# Patient Record
Sex: Male | Born: 1945 | Race: White | Hispanic: No | Marital: Married | Smoking: Former smoker
Health system: Southern US, Community
[De-identification: ages and names within clinical notes are randomized; demographics above are authoritative.]

## PROBLEM LIST (undated history)

## (undated) DIAGNOSIS — N200 Calculus of kidney: Secondary | ICD-10-CM

## (undated) DIAGNOSIS — C61 Malignant neoplasm of prostate: Secondary | ICD-10-CM

## (undated) DIAGNOSIS — E039 Hypothyroidism, unspecified: Secondary | ICD-10-CM

## (undated) DIAGNOSIS — I4891 Unspecified atrial fibrillation: Secondary | ICD-10-CM

## (undated) DIAGNOSIS — G4733 Obstructive sleep apnea (adult) (pediatric): Secondary | ICD-10-CM

## (undated) DIAGNOSIS — I10 Essential (primary) hypertension: Secondary | ICD-10-CM

## (undated) DIAGNOSIS — M720 Palmar fascial fibromatosis [Dupuytren]: Secondary | ICD-10-CM

## (undated) DIAGNOSIS — I4819 Other persistent atrial fibrillation: Secondary | ICD-10-CM

## (undated) DIAGNOSIS — C4490 Unspecified malignant neoplasm of skin, unspecified: Secondary | ICD-10-CM

## (undated) DIAGNOSIS — E876 Hypokalemia: Secondary | ICD-10-CM

## (undated) DIAGNOSIS — I340 Nonrheumatic mitral (valve) insufficiency: Secondary | ICD-10-CM

## (undated) DIAGNOSIS — G4730 Sleep apnea, unspecified: Secondary | ICD-10-CM

## (undated) HISTORY — DX: Sleep apnea, unspecified: G47.30

## (undated) HISTORY — DX: Calculus of kidney: N20.0

## (undated) HISTORY — DX: Unspecified malignant neoplasm of skin, unspecified: C44.90

## (undated) HISTORY — DX: Nonrheumatic mitral (valve) insufficiency: I34.0

## (undated) HISTORY — DX: Other persistent atrial fibrillation: I48.19

## (undated) HISTORY — DX: Essential (primary) hypertension: I10

## (undated) HISTORY — DX: Hypothyroidism, unspecified: E03.9

## (undated) HISTORY — DX: Obstructive sleep apnea (adult) (pediatric): G47.33

## (undated) HISTORY — DX: Malignant neoplasm of prostate: C61

## (undated) HISTORY — DX: Palmar fascial fibromatosis (dupuytren): M72.0

## (undated) HISTORY — DX: Unspecified atrial fibrillation: I48.91

## (undated) HISTORY — PX: RADIOACTIVE SEED IMPLANT: SHX5150

## (undated) HISTORY — DX: Hypokalemia: E87.6

---

## 2009-11-16 ENCOUNTER — Ambulatory Visit: Payer: Self-pay | Admitting: Radiation Oncology

## 2009-12-16 ENCOUNTER — Ambulatory Visit: Payer: Self-pay | Admitting: Radiation Oncology

## 2010-01-03 ENCOUNTER — Ambulatory Visit: Payer: Self-pay | Admitting: Radiation Oncology

## 2010-01-16 ENCOUNTER — Ambulatory Visit: Payer: Self-pay | Admitting: Radiation Oncology

## 2010-01-19 ENCOUNTER — Ambulatory Visit: Payer: Self-pay | Admitting: Urology

## 2010-01-31 ENCOUNTER — Ambulatory Visit: Payer: Self-pay | Admitting: Urology

## 2010-02-15 ENCOUNTER — Ambulatory Visit: Payer: Self-pay | Admitting: Radiation Oncology

## 2010-03-18 ENCOUNTER — Ambulatory Visit: Payer: Self-pay | Admitting: Radiation Oncology

## 2010-07-05 ENCOUNTER — Ambulatory Visit: Payer: Self-pay | Admitting: Radiation Oncology

## 2010-07-06 LAB — PSA

## 2010-07-17 ENCOUNTER — Ambulatory Visit: Payer: Self-pay | Admitting: Radiation Oncology

## 2010-08-17 ENCOUNTER — Ambulatory Visit: Payer: Self-pay | Admitting: Family Medicine

## 2011-01-04 ENCOUNTER — Ambulatory Visit: Payer: Self-pay | Admitting: Radiation Oncology

## 2011-01-17 ENCOUNTER — Ambulatory Visit: Payer: Self-pay | Admitting: Radiation Oncology

## 2012-01-10 ENCOUNTER — Ambulatory Visit: Payer: Self-pay | Admitting: Radiation Oncology

## 2012-01-11 LAB — PSA: PSA: 0.1 ng/mL (ref 0.0–4.0)

## 2012-01-17 ENCOUNTER — Ambulatory Visit: Payer: Self-pay | Admitting: Radiation Oncology

## 2013-01-08 ENCOUNTER — Ambulatory Visit: Payer: Self-pay | Admitting: Radiation Oncology

## 2013-01-16 ENCOUNTER — Ambulatory Visit: Payer: Self-pay | Admitting: Radiation Oncology

## 2014-01-06 ENCOUNTER — Ambulatory Visit: Payer: Self-pay | Admitting: Radiation Oncology

## 2014-01-16 ENCOUNTER — Ambulatory Visit: Payer: Self-pay | Admitting: Radiation Oncology

## 2014-09-05 ENCOUNTER — Ambulatory Visit (INDEPENDENT_AMBULATORY_CARE_PROVIDER_SITE_OTHER): Payer: Commercial Managed Care - HMO | Admitting: Family Medicine

## 2014-09-05 ENCOUNTER — Encounter: Payer: Self-pay | Admitting: Family Medicine

## 2014-09-05 VITALS — BP 105/76 | HR 68 | Temp 97.9°F | Ht 72.3 in | Wt 235.0 lb

## 2014-09-05 DIAGNOSIS — M720 Palmar fascial fibromatosis [Dupuytren]: Secondary | ICD-10-CM | POA: Diagnosis not present

## 2014-09-05 DIAGNOSIS — I10 Essential (primary) hypertension: Secondary | ICD-10-CM

## 2014-09-05 DIAGNOSIS — E039 Hypothyroidism, unspecified: Secondary | ICD-10-CM

## 2014-09-05 DIAGNOSIS — C61 Malignant neoplasm of prostate: Secondary | ICD-10-CM | POA: Diagnosis not present

## 2014-09-05 HISTORY — DX: Malignant neoplasm of prostate: C61

## 2014-09-05 HISTORY — DX: Essential (primary) hypertension: I10

## 2014-09-05 LAB — URINALYSIS, ROUTINE W REFLEX MICROSCOPIC
Bilirubin, UA: NEGATIVE
Glucose, UA: NEGATIVE
Ketones, UA: NEGATIVE
LEUKOCYTES UA: NEGATIVE
Nitrite, UA: NEGATIVE
PH UA: 5 (ref 5.0–7.5)
PROTEIN UA: NEGATIVE
RBC, UA: NEGATIVE
Specific Gravity, UA: 1.03 (ref 1.005–1.030)
Urobilinogen, Ur: 0.2 mg/dL (ref 0.2–1.0)

## 2014-09-05 MED ORDER — LEVOTHYROXINE SODIUM 75 MCG PO TABS: 75.0000 ug | ORAL_TABLET | Freq: Every day | ORAL | Status: DC

## 2014-09-05 MED ORDER — BENAZEPRIL HCL 40 MG PO TABS: 40.0000 mg | ORAL_TABLET | Freq: Every day | ORAL | Status: DC

## 2014-09-05 MED ORDER — AMLODIPINE BESYLATE 10 MG PO TABS: 10.0000 mg | ORAL_TABLET | Freq: Every day | ORAL | Status: DC

## 2014-09-05 NOTE — Assessment & Plan Note (Signed)
The current medical regimen is effective;  continue present plan and medications.  

## 2014-09-05 NOTE — Assessment & Plan Note (Signed)
Stable not much progression

## 2014-09-05 NOTE — Progress Notes (Signed)
BP 105/76 mmHg  Pulse 68  Temp(Src) 97.9 F (36.6 C)  Ht 6' 0.3" (1.836 m)  Wt 235 lb (106.595 kg)  BMI 31.62 kg/m2  SpO2 98%   Subjective:    Patient ID: [name] [name], male    DOB: 1945/07/17, 69 y.o.   MRN: [id number]  HPI: [name] [name] is a 69 y.o. male  Chief Complaint  Patient presents with  . Annual Exam    Relevant past medical, surgical, family and social history reviewed and updated as indicated. Interim medical history since our last visit reviewed. Allergies and medications reviewed and updated. BP and thyroid doing well, no side effects, taking meds every day, no edema Feeling well  Review of Systems  Constitutional: Negative.   HENT: Negative.   Eyes: Negative.   Respiratory: Negative.   Cardiovascular: Negative.   Endocrine: Negative.   Musculoskeletal: Negative.   Skin: Negative.   Allergic/Immunologic: Negative.   Neurological: Negative.   Hematological: Negative.   Psychiatric/Behavioral: Negative.     Per HPI unless specifically indicated above     Objective:    BP 105/76 mmHg  Pulse 68  Temp(Src) 97.9 F (36.6 C)  Ht 6' 0.3" (1.836 m)  Wt 235 lb (106.595 kg)  BMI 31.62 kg/m2  SpO2 98%  Wt Readings from Last 3 Encounters:  09/05/14 235 lb (106.595 kg)  02/28/14 233 lb (105.688 kg)    Physical Exam  Constitutional: He is oriented to person, place, and time. He appears well-developed and well-nourished.  HENT:  Head: Normocephalic and atraumatic.  Right Ear: External ear normal.  Left Ear: External ear normal.  Eyes: Conjunctivae and EOM are normal. Pupils are equal, round, and reactive to light.  Neck: Normal range of motion. Neck supple.  Cardiovascular: Normal rate, regular rhythm, normal heart sounds and intact distal pulses.   Pulmonary/Chest: Effort normal and breath sounds normal.  Abdominal: Soft. Bowel sounds are normal. There is no splenomegaly or hepatomegaly.  Genitourinary: Rectum normal and penis normal.   Prostate s/p radiation   Musculoskeletal: Normal range of motion.  Neurological: He is alert and oriented to person, place, and time. He has normal reflexes.  Skin: No rash noted. No erythema.  Dupuytren's is stable  Psychiatric: He has a normal mood and affect. His behavior is normal. Judgment and thought content normal.    Results for orders placed or performed in visit on 01/10/12  PSA  Result Value Ref Range   PSA 0.1 0.0-4.0 ng/mL      Assessment & Plan:   Problem List Items Addressed This Visit      Cardiovascular and Mediastinum   Hypertension - Primary    The current medical regimen is effective;  continue present plan and medications.       Relevant Medications   amLODipine (NORVASC) 10 MG tablet   benazepril (LOTENSIN) 40 MG tablet   Other Relevant Orders   CBC with Differential/Platelet   Urinalysis, Routine w reflex microscopic (not at [facility])   Lipid panel   Comprehensive metabolic panel     Endocrine   Hypothyroidism    The current medical regimen is effective;  continue present plan and medications.       Relevant Medications   levothyroxine (SYNTHROID, LEVOTHROID) 75 MCG tablet   Other Relevant Orders   TSH     Musculoskeletal and Integument   Dupuytren's contracture    Stable not much progression         Genitourinary   Malignant  neoplasm of prostate    Stable over 6 years now      Relevant Orders   PSA       Follow up plan: Return in about 6 months (around 03/07/2015) for bp chcck.

## 2014-09-05 NOTE — Assessment & Plan Note (Signed)
Stable over 6 years now

## 2014-09-06 LAB — LIPID PANEL
Chol/HDL Ratio: 2.8 ratio units (ref 0.0–5.0)
Cholesterol, Total: 154 mg/dL (ref 100–199)
HDL: 55 mg/dL (ref >39)
LDL CALC: 89 mg/dL (ref 0–99)
Triglycerides: 50 mg/dL (ref 0–149)
VLDL CHOLESTEROL CAL: 10 mg/dL (ref 5–40)

## 2014-09-06 LAB — CBC WITH DIFFERENTIAL/PLATELET
Basophils Absolute: 0 10*3/uL (ref 0.0–0.2)
Basos: 0 %
EOS (ABSOLUTE): 0.2 10*3/uL (ref 0.0–0.4)
EOS: 3 %
Hematocrit: 46.9 % (ref 37.5–51.0)
Hemoglobin: 15.7 g/dL (ref 12.6–17.7)
IMMATURE GRANULOCYTES: 0 %
Immature Grans (Abs): 0 10*3/uL (ref 0.0–0.1)
LYMPHS: 30 %
Lymphocytes Absolute: 1.7 10*3/uL (ref 0.7–3.1)
MCH: 30.5 pg (ref 26.6–33.0)
MCHC: 33.5 g/dL (ref 31.5–35.7)
MCV: 91 fL (ref 79–97)
MONOCYTES: 11 %
Monocytes Absolute: 0.6 10*3/uL (ref 0.1–0.9)
Neutrophils Absolute: 3.2 10*3/uL (ref 1.4–7.0)
Neutrophils: 56 %
PLATELETS: 198 10*3/uL (ref 150–379)
RBC: 5.14 x10E6/uL (ref 4.14–5.80)
RDW: 15.1 % (ref 12.3–15.4)
WBC: 5.7 10*3/uL (ref 3.4–10.8)

## 2014-09-06 LAB — COMPREHENSIVE METABOLIC PANEL
ALBUMIN: 4.2 g/dL (ref 3.6–4.8)
ALK PHOS: 75 IU/L (ref 39–117)
ALT: 24 IU/L (ref 0–44)
AST: 18 IU/L (ref 0–40)
Albumin/Globulin Ratio: 2.2 (ref 1.1–2.5)
BUN/Creatinine Ratio: 15 (ref 10–22)
BUN: 19 mg/dL (ref 8–27)
Bilirubin Total: 1.3 mg/dL — ABNORMAL HIGH (ref 0.0–1.2)
CHLORIDE: 105 mmol/L (ref 97–108)
CO2: 22 mmol/L (ref 18–29)
Calcium: 8.9 mg/dL (ref 8.6–10.2)
Creatinine, Ser: 1.24 mg/dL (ref 0.76–1.27)
GFR calc Af Amer: 68 mL/min/{1.73_m2} (ref >59)
GFR calc non Af Amer: 59 mL/min/{1.73_m2} — ABNORMAL LOW (ref >59)
GLUCOSE: 107 mg/dL — AB (ref 65–99)
Globulin, Total: 1.9 g/dL (ref 1.5–4.5)
Potassium: 4.5 mmol/L (ref 3.5–5.2)
Sodium: 142 mmol/L (ref 134–144)
TOTAL PROTEIN: 6.1 g/dL (ref 6.0–8.5)

## 2014-09-06 LAB — PSA: Prostate Specific Ag, Serum: 0.1 ng/mL (ref 0.0–4.0)

## 2014-09-06 LAB — TSH: TSH: 3.55 u[IU]/mL (ref 0.450–4.500)

## 2014-09-16 ENCOUNTER — Other Ambulatory Visit: Payer: Self-pay | Admitting: Family Medicine

## 2015-01-15 ENCOUNTER — Other Ambulatory Visit: Payer: Self-pay | Admitting: Family Medicine

## 2015-03-07 ENCOUNTER — Other Ambulatory Visit: Payer: Self-pay | Admitting: Family Medicine

## 2015-03-07 ENCOUNTER — Ambulatory Visit (INDEPENDENT_AMBULATORY_CARE_PROVIDER_SITE_OTHER): Payer: Commercial Managed Care - HMO | Admitting: Family Medicine

## 2015-03-07 ENCOUNTER — Encounter: Payer: Self-pay | Admitting: Family Medicine

## 2015-03-07 VITALS — BP 122/77 | HR 70 | Temp 97.9°F | Ht 72.0 in | Wt 233.0 lb

## 2015-03-07 DIAGNOSIS — Z1211 Encounter for screening for malignant neoplasm of colon: Secondary | ICD-10-CM | POA: Diagnosis not present

## 2015-03-07 DIAGNOSIS — Z23 Encounter for immunization: Secondary | ICD-10-CM

## 2015-03-07 DIAGNOSIS — E039 Hypothyroidism, unspecified: Secondary | ICD-10-CM | POA: Diagnosis not present

## 2015-03-07 DIAGNOSIS — I10 Essential (primary) hypertension: Secondary | ICD-10-CM

## 2015-03-07 DIAGNOSIS — M720 Palmar fascial fibromatosis [Dupuytren]: Secondary | ICD-10-CM

## 2015-03-07 MED ORDER — AMLODIPINE BESYLATE 10 MG PO TABS: 10.0000 mg | ORAL_TABLET | Freq: Every day | ORAL | Status: DC

## 2015-03-07 MED ORDER — BENAZEPRIL HCL 40 MG PO TABS: 40.0000 mg | ORAL_TABLET | Freq: Every day | ORAL | Status: DC

## 2015-03-07 NOTE — Assessment & Plan Note (Signed)
stable °

## 2015-03-07 NOTE — Assessment & Plan Note (Signed)
The current medical regimen is effective;  continue present plan and medications.  

## 2015-03-07 NOTE — Progress Notes (Signed)
BP 122/77 mmHg  Pulse 70  Temp(Src) 97.9 F (36.6 C)  Ht 6' (1.829 m)  Wt 233 lb (105.688 kg)  BMI 31.59 kg/m2  SpO2 100%   Subjective:    Patient ID: [name] [name], male    DOB: [birth year], 69 y.o.   MRN: [id number]  HPI: [name] [name] is a 69 y.o. male  Chief Complaint  Patient presents with  . Hypertension   Doing well with blood pressure no complaints medications no side effects takes faithfully Hypothyroid doing well takes medicines faithfully without problems Due to potential contracture stable no real clawing.  Relevant past medical, surgical, family and social history reviewed and updated as indicated. Interim medical history since our last visit reviewed. Allergies and medications reviewed and updated.  Review of Systems  Constitutional: Negative.   Respiratory: Negative.   Cardiovascular: Negative.     Per HPI unless specifically indicated above     Objective:    BP 122/77 mmHg  Pulse 70  Temp(Src) 97.9 F (36.6 C)  Ht 6' (1.829 m)  Wt 233 lb (105.688 kg)  BMI 31.59 kg/m2  SpO2 100%  Wt Readings from Last 3 Encounters:  03/07/15 233 lb (105.688 kg)  09/05/14 235 lb (106.595 kg)  02/28/14 233 lb (105.688 kg)    Physical Exam  Constitutional: He is oriented to person, place, and time. He appears well-developed and well-nourished. No distress.  HENT:  Head: Normocephalic and atraumatic.  Right Ear: Hearing normal.  Left Ear: Hearing normal.  Nose: Nose normal.  Eyes: Conjunctivae and lids are normal. Right eye exhibits no discharge. Left eye exhibits no discharge. No scleral icterus.  Cardiovascular: Normal rate, regular rhythm and normal heart sounds.   Pulmonary/Chest: Effort normal and breath sounds normal. No respiratory distress.  Musculoskeletal: Normal range of motion.  Neurological: He is alert and oriented to person, place, and time.  Skin: Skin is intact. No rash noted.  Psychiatric: He has a normal mood and affect. His speech  is normal and behavior is normal. Judgment and thought content normal. Cognition and memory are normal.    Results for orders placed or performed in visit on 09/05/14  CBC with Differential/Platelet  Result Value Ref Range   WBC 5.7 3.4 - 10.8 x10E3/uL   RBC 5.14 4.14 - 5.80 x10E6/uL   Hemoglobin 15.7 12.6 - 17.7 g/dL   Hematocrit 46.9 37.5 - 51.0 %   MCV 91 79 - 97 fL   MCH 30.5 26.6 - 33.0 pg   MCHC 33.5 31.5 - 35.7 g/dL   RDW 15.1 12.3 - 15.4 %   Platelets 198 150 - 379 x10E3/uL   Neutrophils 56 %   Lymphs 30 %   Monocytes 11 %   Eos 3 %   Basos 0 %   Neutrophils Absolute 3.2 1.4 - 7.0 x10E3/uL   Lymphocytes Absolute 1.7 0.7 - 3.1 x10E3/uL   Monocytes Absolute 0.6 0.1 - 0.9 x10E3/uL   EOS (ABSOLUTE) 0.2 0.0 - 0.4 x10E3/uL   Basophils Absolute 0.0 0.0 - 0.2 x10E3/uL   Immature Granulocytes 0 %   Immature Grans (Abs) 0.0 0.0 - 0.1 x10E3/uL  Urinalysis, Routine w reflex microscopic (not at [facility])  Result Value Ref Range   Specific Gravity, UA 1.030 1.005 - 1.030   pH, UA 5.0 5.0 - 7.5   Color, UA Yellow Yellow   Appearance Ur Clear Clear   Leukocytes, UA Negative Negative   Protein, UA Negative Negative/Trace   Glucose, UA Negative  Negative   Ketones, UA Negative Negative   RBC, UA Negative Negative   Bilirubin, UA Negative Negative   Urobilinogen, Ur 0.2 0.2 - 1.0 mg/dL   Nitrite, UA Negative Negative  TSH  Result Value Ref Range   TSH 3.550 0.450 - 4.500 uIU/mL  PSA  Result Value Ref Range   Prostate Specific Ag, Serum 0.1 0.0 - 4.0 ng/mL  Lipid panel  Result Value Ref Range   Cholesterol, Total 154 100 - 199 mg/dL   Triglycerides 50 0 - 149 mg/dL   HDL 55 >39 mg/dL   VLDL Cholesterol Cal 10 5 - 40 mg/dL   LDL Calculated 89 0 - 99 mg/dL   Chol/HDL Ratio 2.8 0.0 - 5.0 ratio units  Comprehensive metabolic panel  Result Value Ref Range   Glucose 107 (H) 65 - 99 mg/dL   BUN 19 8 - 27 mg/dL   Creatinine, Ser 1.24 0.76 - 1.27 mg/dL   GFR calc non Af Amer 59 (L)  >59 mL/min/1.73   GFR calc Af Amer 68 >59 mL/min/1.73   BUN/Creatinine Ratio 15 10 - 22   Sodium 142 134 - 144 mmol/L   Potassium 4.5 3.5 - 5.2 mmol/L   Chloride 105 97 - 108 mmol/L   CO2 22 18 - 29 mmol/L   Calcium 8.9 8.6 - 10.2 mg/dL   Total Protein 6.1 6.0 - 8.5 g/dL   Albumin 4.2 3.6 - 4.8 g/dL   Globulin, Total 1.9 1.5 - 4.5 g/dL   Albumin/Globulin Ratio 2.2 1.1 - 2.5   Bilirubin Total 1.3 (H) 0.0 - 1.2 mg/dL   Alkaline Phosphatase 75 39 - 117 IU/L   AST 18 0 - 40 IU/L   ALT 24 0 - 44 IU/L      Assessment & Plan:   Problem List Items Addressed This Visit      Cardiovascular and Mediastinum   Hypertension    The current medical regimen is effective;  continue present plan and medications.       Relevant Medications   amLODipine (NORVASC) 10 MG tablet   benazepril (LOTENSIN) 40 MG tablet     Endocrine   Hypothyroidism    The current medical regimen is effective;  continue present plan and medications.         Musculoskeletal and Integument   Dupuytren's contracture    stable       Other Visit Diagnoses    Colon cancer screening    -  Primary    Relevant Orders    Ambulatory referral to General Surgery    Immunization due        Relevant Orders    Flu Vaccine QUAD 36+ mos PF IM (Fluarix & Fluzone Quad PF) (Completed)        Follow up plan: Return in about 6 months (around 09/05/2015), or if symptoms worsen or fail to improve, for Physical Exam.

## 2015-03-07 NOTE — Addendum Note (Signed)
Addended byGolden Pop on: 03/07/2015 08:56 AM   Modules accepted: Orders, SmartSet

## 2015-03-08 ENCOUNTER — Telehealth: Payer: Self-pay | Admitting: Gastroenterology

## 2015-03-08 ENCOUNTER — Encounter: Payer: Self-pay | Admitting: Family Medicine

## 2015-03-08 LAB — BASIC METABOLIC PANEL
BUN/Creatinine Ratio: 10 (ref 10–22)
BUN: 13 mg/dL (ref 8–27)
CO2: 24 mmol/L (ref 18–29)
Calcium: 9.4 mg/dL (ref 8.6–10.2)
Chloride: 106 mmol/L (ref 96–106)
Creatinine, Ser: 1.31 mg/dL — ABNORMAL HIGH (ref 0.76–1.27)
GFR calc Af Amer: 63 mL/min/{1.73_m2} (ref >59)
GFR calc non Af Amer: 55 mL/min/{1.73_m2} — ABNORMAL LOW (ref >59)
GLUCOSE: 106 mg/dL — AB (ref 65–99)
POTASSIUM: 4.3 mmol/L (ref 3.5–5.2)
SODIUM: 144 mmol/L (ref 134–144)

## 2015-03-08 NOTE — Telephone Encounter (Signed)
Patient left a voice message that he is returning a phone call for colonoscopy triage

## 2015-03-16 ENCOUNTER — Other Ambulatory Visit: Payer: Self-pay

## 2015-03-16 ENCOUNTER — Telehealth: Payer: Self-pay | Admitting: Gastroenterology

## 2015-03-16 NOTE — Telephone Encounter (Signed)
Gastroenterology Pre-Procedure Review  Request Date: 04-18-2015 Requesting Physician: Dr.   PATIENT REVIEW QUESTIONS: The patient responded to the following health history questions as indicated:    1. Are you having any GI issues? no 2. Do you have a personal history of Polyps? no 3. Do you have a family history of Colon Cancer or Polyps? no 4. Diabetes Mellitus? no 5. Joint replacements in the past 12 months?no 6. Major health problems in the past 3 months?no 7. Any artificial heart valves, MVP, or defibrillator?no    MEDICATIONS & ALLERGIES:    Patient reports the following regarding taking any anticoagulation/antiplatelet therapy:   Plavix, Coumadin, Eliquis, Xarelto, Lovenox, Pradaxa, Brilinta, or Effient? no Aspirin? yes (Daily)  Patient confirms/reports the following medications:  Current Outpatient Prescriptions  Medication Sig Dispense Refill   amLODipine (NORVASC) 10 MG tablet Take 1 tablet (10 mg total) by mouth daily. 30 tablet 7   aspirin 81 MG tablet Take 81 mg by mouth daily.     benazepril (LOTENSIN) 40 MG tablet Take 1 tablet (40 mg total) by mouth daily. 30 tablet 7   levothyroxine (SYNTHROID, LEVOTHROID) 75 MCG tablet TAKE ONE TABLET BY MOUTH ONCE DAILY 30 tablet 12   Magnesium Citrate 100 MG TABS Take 1 capsule by mouth daily.     Multiple Vitamin (MULTIVITAMIN) tablet Take 1 tablet by mouth daily.     Selenium 200 MCG CAPS Take 1 capsule by mouth daily.     No current facility-administered medications for this visit.    Patient confirms/reports the following allergies:  No Known Allergies  No orders of the defined types were placed in this encounter.    AUTHORIZATION INFORMATION Primary Insurance:HTA 03/2015 1D#: Group #:  Secondary Insurance: 1D#: Group #:  SCHEDULE INFORMATION: Date: 04-18-2015 Time: Location:

## 2015-04-17 ENCOUNTER — Encounter: Payer: Self-pay | Admitting: *Deleted

## 2015-04-18 ENCOUNTER — Inpatient Hospital Stay
Admission: EM | Admit: 2015-04-18 | Discharge: 2015-04-19 | DRG: 310 | Disposition: A | Discharge status: Home / Self Care | Payer: PPO | Attending: Internal Medicine | Admitting: Internal Medicine

## 2015-04-18 ENCOUNTER — Ambulatory Visit: Payer: PPO | Admitting: Anesthesiology

## 2015-04-18 ENCOUNTER — Emergency Department
Admission: EM | Admit: 2015-04-18 | Discharge: 2015-04-18 | Discharge status: Absent without leave | Payer: PPO | Source: Home / Self Care

## 2015-04-18 ENCOUNTER — Inpatient Hospital Stay (HOSPITAL_COMMUNITY)
Admit: 2015-04-18 | Discharge: 2015-04-18 | Disposition: A | Discharge status: Home / Self Care | Payer: PPO | Attending: Internal Medicine | Admitting: Internal Medicine

## 2015-04-18 ENCOUNTER — Ambulatory Visit
Admission: RE | Admit: 2015-04-18 | Discharge: 2015-04-18 | Disposition: A | Discharge status: Home / Self Care | Payer: PPO | Source: Ambulatory Visit | Attending: Gastroenterology | Admitting: Gastroenterology

## 2015-04-18 ENCOUNTER — Encounter
Admission: RE | Disposition: A | Discharge status: Home / Self Care | Payer: Self-pay | Source: Ambulatory Visit | Attending: Gastroenterology

## 2015-04-18 ENCOUNTER — Ambulatory Visit: Payer: PPO

## 2015-04-18 ENCOUNTER — Inpatient Hospital Stay: Admit: 2015-04-18 | Payer: PPO

## 2015-04-18 ENCOUNTER — Encounter: Payer: Self-pay | Admitting: *Deleted

## 2015-04-18 DIAGNOSIS — I10 Essential (primary) hypertension: Secondary | ICD-10-CM | POA: Diagnosis not present

## 2015-04-18 DIAGNOSIS — I4891 Unspecified atrial fibrillation: Secondary | ICD-10-CM

## 2015-04-18 DIAGNOSIS — Z8249 Family history of ischemic heart disease and other diseases of the circulatory system: Secondary | ICD-10-CM | POA: Diagnosis not present

## 2015-04-18 DIAGNOSIS — Z8546 Personal history of malignant neoplasm of prostate: Secondary | ICD-10-CM

## 2015-04-18 DIAGNOSIS — E039 Hypothyroidism, unspecified: Secondary | ICD-10-CM | POA: Diagnosis present

## 2015-04-18 DIAGNOSIS — E876 Hypokalemia: Secondary | ICD-10-CM | POA: Diagnosis not present

## 2015-04-18 DIAGNOSIS — Z7982 Long term (current) use of aspirin: Secondary | ICD-10-CM | POA: Diagnosis not present

## 2015-04-18 DIAGNOSIS — E038 Other specified hypothyroidism: Secondary | ICD-10-CM | POA: Diagnosis not present

## 2015-04-18 DIAGNOSIS — Z87442 Personal history of urinary calculi: Secondary | ICD-10-CM

## 2015-04-18 DIAGNOSIS — Z1211 Encounter for screening for malignant neoplasm of colon: Secondary | ICD-10-CM | POA: Diagnosis not present

## 2015-04-18 DIAGNOSIS — Z87891 Personal history of nicotine dependence: Secondary | ICD-10-CM | POA: Diagnosis not present

## 2015-04-18 DIAGNOSIS — Z79899 Other long term (current) drug therapy: Secondary | ICD-10-CM | POA: Diagnosis not present

## 2015-04-18 DIAGNOSIS — I517 Cardiomegaly: Secondary | ICD-10-CM | POA: Diagnosis not present

## 2015-04-18 HISTORY — DX: Unspecified atrial fibrillation: I48.91

## 2015-04-18 HISTORY — PX: COLONOSCOPY WITH PROPOFOL: SHX5780

## 2015-04-18 LAB — BASIC METABOLIC PANEL
ANION GAP: 11 (ref 5–15)
BUN: 14 mg/dL (ref 6–20)
CO2: 22 mmol/L (ref 22–32)
Calcium: 8.9 mg/dL (ref 8.9–10.3)
Chloride: 108 mmol/L (ref 101–111)
Creatinine, Ser: 1.07 mg/dL (ref 0.61–1.24)
GFR calc Af Amer: >60 mL/min (ref >60)
GFR calc non Af Amer: >60 mL/min (ref >60)
GLUCOSE: 111 mg/dL — AB (ref 65–99)
POTASSIUM: 3.6 mmol/L (ref 3.5–5.1)
Sodium: 141 mmol/L (ref 135–145)

## 2015-04-18 LAB — TROPONIN I: Troponin I: <0.03 ng/mL (ref <0.031)

## 2015-04-18 LAB — CBC WITH DIFFERENTIAL/PLATELET
BASOS ABS: 0.1 10*3/uL (ref 0–0.1)
Basophils Relative: 1 %
EOS PCT: 1 %
Eosinophils Absolute: 0 10*3/uL (ref 0–0.7)
HEMATOCRIT: 48.9 % (ref 40.0–52.0)
Hemoglobin: 16.3 g/dL (ref 13.0–18.0)
LYMPHS ABS: 1.2 10*3/uL (ref 1.0–3.6)
LYMPHS PCT: 18 %
MCH: 31.3 pg (ref 26.0–34.0)
MCHC: 33.4 g/dL (ref 32.0–36.0)
MCV: 93.8 fL (ref 80.0–100.0)
MONO ABS: 0.6 10*3/uL (ref 0.2–1.0)
Monocytes Relative: 9 %
NEUTROS ABS: 4.7 10*3/uL (ref 1.4–6.5)
Neutrophils Relative %: 71 %
Platelets: 171 10*3/uL (ref 150–440)
RBC: 5.21 MIL/uL (ref 4.40–5.90)
RDW: 14.8 % — AB (ref 11.5–14.5)
WBC: 6.6 10*3/uL (ref 3.8–10.6)

## 2015-04-18 LAB — GLUCOSE, CAPILLARY
GLUCOSE-CAPILLARY: 89 mg/dL (ref 65–99)
Glucose-Capillary: 73 mg/dL (ref 65–99)

## 2015-04-18 LAB — BRAIN NATRIURETIC PEPTIDE: B Natriuretic Peptide: 281 pg/mL — ABNORMAL HIGH (ref 0.0–100.0)

## 2015-04-18 LAB — MAGNESIUM: MAGNESIUM: 1.7 mg/dL (ref 1.7–2.4)

## 2015-04-18 LAB — T4, FREE: FREE T4: 1.43 ng/dL — AB (ref 0.61–1.12)

## 2015-04-18 LAB — TSH: TSH: 2.074 u[IU]/mL (ref 0.350–4.500)

## 2015-04-18 SURGERY — COLONOSCOPY WITH PROPOFOL
Anesthesia: General

## 2015-04-18 MED ORDER — LEVOTHYROXINE SODIUM 50 MCG PO TABS
50.0000 ug | ORAL_TABLET | Freq: Every day | ORAL | Status: DC
  Administered 2015-04-19: 50 ug via ORAL
  Filled 2015-04-18: qty 1

## 2015-04-18 MED ORDER — DILTIAZEM LOAD VIA INFUSION
10.0000 mg | Freq: Once | INTRAVENOUS | Status: DC
  Filled 2015-04-18: qty 10

## 2015-04-18 MED ORDER — HYDROCODONE-ACETAMINOPHEN 5-325 MG PO TABS: 1.0000 | ORAL_TABLET | ORAL | Status: DC | PRN

## 2015-04-18 MED ORDER — ONDANSETRON HCL 4 MG PO TABS: 4.0000 mg | ORAL_TABLET | Freq: Four times a day (QID) | ORAL | Status: DC | PRN

## 2015-04-18 MED ORDER — ASPIRIN EC 325 MG PO TBEC
325.0000 mg | DELAYED_RELEASE_TABLET | Freq: Every day | ORAL | Status: DC
  Administered 2015-04-18 – 2015-04-19 (×2): 325 mg via ORAL
  Filled 2015-04-18 (×2): qty 1

## 2015-04-18 MED ORDER — ACETAMINOPHEN 650 MG RE SUPP: 650.0000 mg | Freq: Four times a day (QID) | RECTAL | Status: DC | PRN

## 2015-04-18 MED ORDER — DILTIAZEM HCL 25 MG/5ML IV SOLN
INTRAVENOUS | Status: AC
  Administered 2015-04-18: 10 mg via INTRAVENOUS
  Filled 2015-04-18: qty 5

## 2015-04-18 MED ORDER — ALUM & MAG HYDROXIDE-SIMETH 200-200-20 MG/5ML PO SUSP: 30.0000 mL | Freq: Four times a day (QID) | ORAL | Status: DC | PRN

## 2015-04-18 MED ORDER — ONDANSETRON HCL 4 MG/2ML IJ SOLN: 4.0000 mg | Freq: Four times a day (QID) | INTRAMUSCULAR | Status: DC | PRN

## 2015-04-18 MED ORDER — METOPROLOL TARTRATE 25 MG PO TABS
25.0000 mg | ORAL_TABLET | Freq: Four times a day (QID) | ORAL | Status: DC
  Administered 2015-04-18 (×2): 25 mg via ORAL
  Filled 2015-04-18 (×4): qty 1

## 2015-04-18 MED ORDER — INSULIN ASPART 100 UNIT/ML ~~LOC~~ SOLN: 0.0000 [IU] | Freq: Three times a day (TID) | SUBCUTANEOUS | Status: DC

## 2015-04-18 MED ORDER — ACETAMINOPHEN 325 MG PO TABS: 650.0000 mg | ORAL_TABLET | Freq: Four times a day (QID) | ORAL | Status: DC | PRN

## 2015-04-18 MED ORDER — DILTIAZEM HCL 25 MG/5ML IV SOLN
10.0000 mg | Freq: Once | INTRAVENOUS | Status: AC
  Administered 2015-04-18: 10 mg via INTRAVENOUS

## 2015-04-18 MED ORDER — SODIUM CHLORIDE 0.9 % IV SOLN
Freq: Once | INTRAVENOUS | Status: AC
  Administered 2015-04-18: 11:00:00 via INTRAVENOUS

## 2015-04-18 MED ORDER — DEXTROSE 5 % IV SOLN
5.0000 mg/h | INTRAVENOUS | Status: DC
  Administered 2015-04-18: 5 mg/h via INTRAVENOUS
  Administered 2015-04-18: 7.5 mg/h via INTRAVENOUS
  Administered 2015-04-18: 10 mg/h via INTRAVENOUS
  Administered 2015-04-18: 7.5 mg/h via INTRAVENOUS
  Administered 2015-04-18: 5 mg/h via INTRAVENOUS
  Filled 2015-04-18 (×2): qty 100

## 2015-04-18 MED ORDER — APIXABAN 5 MG PO TABS
5.0000 mg | ORAL_TABLET | Freq: Two times a day (BID) | ORAL | Status: DC
  Administered 2015-04-18 – 2015-04-19 (×2): 5 mg via ORAL
  Filled 2015-04-18 (×2): qty 1

## 2015-04-18 MED ORDER — ENOXAPARIN SODIUM 120 MG/0.8ML ~~LOC~~ SOLN
1.0000 mg/kg | Freq: Two times a day (BID) | SUBCUTANEOUS | Status: DC
  Filled 2015-04-18 (×2): qty 0.8

## 2015-04-18 MED ORDER — INSULIN ASPART 100 UNIT/ML ~~LOC~~ SOLN: 0.0000 [IU] | Freq: Every day | SUBCUTANEOUS | Status: DC

## 2015-04-18 MED ORDER — SODIUM CHLORIDE 0.9 % IV SOLN
INTRAVENOUS | Status: DC
  Administered 2015-04-18: 1000 mL via INTRAVENOUS

## 2015-04-18 MED ORDER — SODIUM CHLORIDE 0.9% FLUSH
3.0000 mL | Freq: Two times a day (BID) | INTRAVENOUS | Status: DC
  Administered 2015-04-18: 3 mL via INTRAVENOUS

## 2015-04-18 MED ORDER — SODIUM CHLORIDE 0.9 % IV SOLN
Freq: Once | INTRAVENOUS | Status: AC
  Administered 2015-04-18: 10:00:00 via INTRAVENOUS

## 2015-04-18 MED ORDER — SENNOSIDES-DOCUSATE SODIUM 8.6-50 MG PO TABS: 1.0000 | ORAL_TABLET | Freq: Every evening | ORAL | Status: DC | PRN

## 2015-04-18 NOTE — H&P (Signed)
  [facility]  [address], [place], [place] [address] Phone: [phone] Fax : [phone]  Primary Care Physician:  Golden Pop, MD Primary Gastroenterologist:  Dr. Allen Norris  Pre-Procedure History & Physical: HPI:  [name] [name] is a 70 y.o. male is here for a screening colonoscopy.   Past Medical History  Diagnosis Date  . Dupuytren's contracture   . Hypertension   . Cancer ([place])     prostate,skin of the scalp  . Nephrolithiasis     Past Surgical History  Procedure Laterality Date  . Radioactive seed implant N/A     Prior to Admission medications   Medication Sig Start Date End Date Taking? Authorizing Provider  amLODipine (NORVASC) 10 MG tablet Take 1 tablet (10 mg total) by mouth daily. 03/07/15  Yes Guadalupe Maple, MD  levothyroxine (SYNTHROID, LEVOTHROID) 75 MCG tablet TAKE ONE TABLET BY MOUTH ONCE DAILY 09/16/14  Yes Guadalupe Maple, MD  aspirin 81 MG tablet Take 81 mg by mouth daily.    Historical Provider, MD  benazepril (LOTENSIN) 40 MG tablet Take 1 tablet (40 mg total) by mouth daily. 03/07/15   Guadalupe Maple, MD  Magnesium Citrate 100 MG TABS Take 1 capsule by mouth daily.    Historical Provider, MD  Multiple Vitamin (MULTIVITAMIN) tablet Take 1 tablet by mouth daily.    Historical Provider, MD  Selenium 200 MCG CAPS Take 1 capsule by mouth daily.    Historical Provider, MD    Allergies as of 03/16/2015  . (No Known Allergies)    Family History  Problem Relation Age of Onset  . Heart attack Mother   . Heart disease Father     Social History   Social History  . Marital Status: Married    Spouse Name: N/A  . Number of Children: N/A  . Years of Education: N/A   Occupational History  . Not on file.   Social History Main Topics  . Smoking status: Former Smoker    Types: Cigarettes    Quit date: 09/05/1994  . Smokeless tobacco: Former Systems developer    Types: Chew    Quit date: 09/04/2004  . Alcohol Use: No  . Drug Use: No  .  Sexual Activity: Not on file   Other Topics Concern  . Not on file   Social History Narrative    Review of Systems: See HPI, otherwise negative ROS  Physical Exam: BP 98/70 mmHg  Pulse 61  Temp(Src) 98.6 F (37 C) (Oral)  Resp 24  Ht 6\' 1"  (1.854 m)  Wt 228 lb (103.42 kg)  BMI 30.09 kg/m2 General:   Alert,  pleasant and cooperative in NAD Head:  Normocephalic and atraumatic. Neck:  Supple; no masses or thyromegaly. Lungs:  Clear throughout to auscultation.    Heart:  Regular rate and rhythm. Abdomen:  Soft, nontender and nondistended. Normal bowel sounds, without guarding, and without rebound.   Neurologic:  Alert and  oriented x4;  grossly normal neurologically.  Impression/Plan: [name] [name] is now here to undergo a screening colonoscopy.  Risks, benefits, and alternatives regarding colonoscopy have been reviewed with the patient.  Questions have been answered.  All parties agreeable.

## 2015-04-18 NOTE — ED Notes (Signed)
Pt to ED escorted by endoscopy staff c/o new-onset a-fib.  No c/o pain, dizziness, lightheadedness, n/v, palpitations.  Hx HTN.  A&Ox4. -Terrilyn Saver, RN (transcribed by Gertie Exon, RN)

## 2015-04-18 NOTE — ED Notes (Signed)
Prior to 11:40 patient had been charted on by [place], RN but an error caused the chart to be an endoscopy chart not an ED chart so I am now copying by hand the endoscopy chart into this patient's ED chart.

## 2015-04-18 NOTE — ED Notes (Signed)
Pt to ED escorted by endoscopy staff c/o new-onset a-fib. No c/o pain, dizziness, lightheadedness, n/v, palpitations. Hx HTN. A&Ox4.

## 2015-04-18 NOTE — ED Provider Notes (Signed)
Previous note was locked out due to a medical records air.    Patient with new onset atrial fibrillation with rapid response. His free T4 is elevated which may be the cause. I have discussed with the hospitalist and the patient is agreeable to admission.  Earleen Newport, MD 04/18/15 [phone]

## 2015-04-18 NOTE — Progress Notes (Signed)
Skin verified on admission by Crystal.

## 2015-04-18 NOTE — Anesthesia Postprocedure Evaluation (Signed)
Anesthesia Post Note  Patient: [name] [name]  Procedure(s) Performed: Procedure(s) (LRB): COLONOSCOPY WITH PROPOFOL (N/A)  Patient location during evaluation: Endoscopy Anesthesia Type: General Level of consciousness: awake and alert Pain management: pain level controlled Vital Signs Assessment: post-procedure vital signs reviewed and stable Respiratory status: spontaneous breathing, nonlabored ventilation, respiratory function stable and patient connected to nasal cannula oxygen Cardiovascular status: blood pressure returned to baseline and stable Postop Assessment: no signs of nausea or vomiting Anesthetic complications: no Comments: Case aborted due to patient had HR of 180s when put on monitors in the room.  Sending patient to the ER for further workup    Last Vitals:  Filed Vitals:   04/18/15 0910  BP: 98/70  Pulse: 61  Temp: 37 C  Resp: 24    Last Pain: There were no vitals filed for this visit.               Precious Haws Keelia Graybill

## 2015-04-18 NOTE — Anesthesia Preprocedure Evaluation (Signed)
Anesthesia Evaluation  Patient identified by MRN, date of birth, ID band Patient awake    Reviewed: Allergy & Precautions, H&P , NPO status , Patient's Chart, lab work & pertinent test results  History of Anesthesia Complications Negative for: history of anesthetic complications  Airway Mallampati: III  TM Distance: >3 FB Neck ROM: limited    Dental  (+) Poor Dentition, Chipped   Pulmonary sleep apnea , former smoker,    Pulmonary exam normal breath sounds clear to auscultation       Cardiovascular Exercise Tolerance: Good hypertension, (-) angina(-) Past MI and (-) DOE Normal cardiovascular exam Rhythm:regular Rate:Normal     Neuro/Psych negative neurological ROS  negative psych ROS   GI/Hepatic negative GI ROS, Neg liver ROS, neg GERD  ,  Endo/Other  Hypothyroidism   Renal/GU Renal disease  negative genitourinary   Musculoskeletal   Abdominal   Peds  Hematology negative hematology ROS (+)   Anesthesia Other Findings Past Medical History:   Dupuytren's contracture                                      Hypertension                                                 Cancer ([place])                                                   Comment:prostate,skin of the scalp   Nephrolithiasis                                             Past Surgical History:   RADIOACTIVE SEED IMPLANT                        N/A             BMI    Body Mass Index   30.08 kg/m 2      Reproductive/Obstetrics negative OB ROS                             Anesthesia Physical Anesthesia Plan  ASA: III  Anesthesia Plan: General   Post-op Pain Management:    Induction:   Airway Management Planned:   Additional Equipment:   Intra-op Plan:   Post-operative Plan:   Informed Consent: I have reviewed the patients History and Physical, chart, labs and discussed the procedure including the risks, benefits and  alternatives for the proposed anesthesia with the patient or authorized representative who has indicated his/her understanding and acceptance.   Dental Advisory Given  Plan Discussed with: Anesthesiologist, CRNA and Surgeon  Anesthesia Plan Comments:         Anesthesia Quick Evaluation

## 2015-04-18 NOTE — Transfer of Care (Signed)
Immediate Anesthesia Transfer of Care Note  Patient: [name] [name]  Procedure(s) Performed: Procedure(s): COLONOSCOPY WITH PROPOFOL (N/A)  Patient Location: Endoscopy Unit  Anesthesia Type:General  Level of Consciousness: awake, alert  and oriented  Airway & Oxygen Therapy: Patient Spontanous Breathing and Patient connected to nasal cannula oxygen  Post-op Assessment: Report given to RN and Post -op Vital signs reviewed and stable  Post vital signs: Reviewed and stable  Last Vitals:  Filed Vitals:   04/18/15 0910  BP: 98/70  Pulse: 61  Temp: 37 C  Resp: 24    Complications: No apparent anesthesia complications

## 2015-04-18 NOTE — Progress Notes (Signed)
*  PRELIMINARY RESULTS* Echocardiogram 2D Echocardiogram has been performed.  [name] [name] 04/18/2015, 6:05 PM

## 2015-04-18 NOTE — Progress Notes (Signed)
Spoke with dr. Benjie Karvonen to clarify 10mg  cardizem bolus order. Per md do not give bolus from bag. Patient already have 10mg  iv bolus in ed.

## 2015-04-18 NOTE — H&P (Addendum)
[facility] [place] NAME: [name] [name]    MR#:  [id number]  DATE OF BIRTH:  [birth year]  DATE OF ADMISSION:  04/18/2015  PRIMARY CARE PHYSICIAN: Golden Pop, MD   REQUESTING/REFERRING PHYSICIAN: Dr. Jimmye Norman  CHIEF COMPLAINT:  Sent from GI suite for new onset atrial fibrillation HISTORY OF PRESENT ILLNESS:  [name] [name]  is a 70 y.o. male with a known history of essential hypertension and hypothyroidism who presents with above complaint. Patient was scheduled for colonoscopy this morning. When he arrived to the colonoscopy suite it was found that he was in atrial fibrillation. Patient is completely asymptomatic. Patient denies chest pain, palpitations or shortness of breath.  PAST MEDICAL HISTORY:   Past Medical History  Diagnosis Date  . Dupuytren's contracture   . Hypertension   . Cancer ([place])     prostate,skin of the scalp  . Nephrolithiasis    Hypothyroid PAST SURGICAL HISTORY:   Past Surgical History  Procedure Laterality Date  . Radioactive seed implant N/A     SOCIAL HISTORY:   Social History  Substance Use Topics  . Smoking status: Former Smoker    Types: Cigarettes    Quit date: 09/05/1994  . Smokeless tobacco: Former Systems developer    Types: Chew    Quit date: 09/04/2004  . Alcohol Use: No    FAMILY HISTORY:   Family History  Problem Relation Age of Onset  . Heart attack Mother   . Heart disease Father     DRUG ALLERGIES:  No Known Allergies   REVIEW OF SYSTEMS:  CONSTITUTIONAL: No fever, fatigue or weakness.  EYES: No blurred or double vision.  EARS, NOSE, AND THROAT: No tinnitus or ear pain.  RESPIRATORY: No cough, shortness of breath, wheezing or hemoptysis.  CARDIOVASCULAR: No chest pain, orthopnea, edema.  GASTROINTESTINAL: No nausea, vomiting, diarrhea or abdominal pain.  GENITOURINARY: No dysuria, hematuria.  ENDOCRINE: No polyuria, nocturia,  HEMATOLOGY: No anemia, easy bruising or  bleeding SKIN: No rash or lesion. MUSCULOSKELETAL: No joint pain or arthritis.   NEUROLOGIC: No tingling, numbness, weakness.  PSYCHIATRY: No anxiety or depression.   MEDICATIONS AT HOME:   Prior to Admission medications   Medication Sig Start Date End Date Taking? Authorizing Provider  amLODipine (NORVASC) 10 MG tablet Take 1 tablet (10 mg total) by mouth daily. 03/07/15  Yes Guadalupe Maple, MD  aspirin 81 MG tablet Take 81 mg by mouth daily.   Yes Historical Provider, MD  benazepril (LOTENSIN) 40 MG tablet Take 1 tablet (40 mg total) by mouth daily. 03/07/15  Yes Guadalupe Maple, MD  levothyroxine (SYNTHROID, LEVOTHROID) 75 MCG tablet TAKE ONE TABLET BY MOUTH ONCE DAILY 09/16/14  Yes Guadalupe Maple, MD  Magnesium Citrate 100 MG TABS Take 1 capsule by mouth daily.   Yes Historical Provider, MD  Multiple Vitamin (MULTIVITAMIN) tablet Take 1 tablet by mouth daily.   Yes Historical Provider, MD  Selenium 200 MCG CAPS Take 1 capsule by mouth daily.   Yes Historical Provider, MD      VITAL SIGNS:  Blood pressure 109/78, pulse 109, resp. rate 12, SpO2 95 %.  PHYSICAL EXAMINATION:  GENERAL:  70 y.o.-year-old patient lying in the bed with no acute distress.  EYES: Pupils equal, round, reactive to light and accommodation. No scleral icterus. Extraocular muscles intact.  HEENT: Head atraumatic, normocephalic. Oropharynx and nasopharynx clear.  NECK:  Supple, no jugular venous distention. No thyroid enlargement, no tenderness.  LUNGS: Normal  breath sounds bilaterally, no wheezing, rales,rhonchi or crepitation. No use of accessory muscles of respiration.  CARDIOVASCULAR: irregular, irregular with tachycardia. No murmurs, rubs, or gallops.  ABDOMEN: Soft, nontender, nondistended. Bowel sounds present. No organomegaly or mass.  EXTREMITIES: No pedal edema, cyanosis, or clubbing.  NEUROLOGIC: Cranial nerves II through XII are grossly intact. No focal deficits. PSYCHIATRIC: The patient is alert and  oriented x 3.  SKIN: No obvious rash, lesion, or ulcer.   LABORATORY PANEL:   CBC  Recent Labs Lab 04/18/15 1021  WBC 6.6  HGB 16.3  HCT 48.9  PLT 171   ------------------------------------------------------------------------------------------------------------------  Chemistries   Recent Labs Lab 04/18/15 1021  NA 141  K 3.6  CL 108  CO2 22  GLUCOSE 111*  BUN 14  CREATININE 1.07  CALCIUM 8.9   ------------------------------------------------------------------------------------------------------------------  Cardiac Enzymes  Recent Labs Lab 04/18/15 1021  TROPONINI <0.03   ------------------------------------------------------------------------------------------------------------------  RADIOLOGY:  Dg Chest Port 1 View  04/18/2015  CLINICAL DATA:  Dyspnea, new onset of atrial fibrillation, palpitations EXAM: PORTABLE CHEST 1 VIEW COMPARISON:  None. FINDINGS: Borderline cardiomegaly. No acute infiltrate or pleural effusion. No pulmonary edema. Mild degenerative changes thoracic spine. IMPRESSION: No active disease.  Borderline cardiomegaly. Electronically Signed   By: Lahoma Crocker M.D.   On: 04/18/2015 10:49    EKG:  Atrial fibrillation heart rate 150s  IMPRESSION AND PLAN:   71 year old male with a history of hypertension and hypothyroidism who presents with asymptomatic atrial fibrillation RVR.   1. Atrial fibrillation with RVR: Patient has a chads score of 1 for hypertension. Patient would benefit with at least full dose aspirin. I reviewed side effects, alternatives and risks with the patient. Patient will need that her heart rate control. I will start with 10 mg IV diltiazem and then diltiazem drip. Patient will need to be transitioned to oral diltiazem or metoprolol. Cardiology consultation has been placed. 2-D echocardiogram has also been ordered. Free T4 is elevated in could be the etiology of atrial fibrillation. Continue to monitor on telemetry. Titrate  diltiazem drip to heart rate less than 100.  2. Hypothyroidism: Free T4 is elevated and therefore I will decrease Synthroid to 50 g daily and he will need repeat TSH and free T4 in 4-6 weeks.      All the records are reviewed and case discussed with ED provider. Management plans discussed with the patient and he is in agreement.  CODE STATUS FULL  CRITICAL CARE TOTAL TIME TAKING CARE OF THIS PATIENT 50 minutes.    Bon Dowis M.D on 04/18/2015 at 12:40 PM  Between 7am to 6pm - Pager - [phone] After 6pm go to [email or web address] - password [email or web address] [facility]  Office  [phone]  CC: Primary care physician; Golden Pop, MD

## 2015-04-18 NOTE — Consult Note (Signed)
Cardiology Consultation Note  Patient ID: [name] [name], MRN: [id number], DOB/AGE: June 29, 1945 70 y.o. Admit date: 04/18/2015   Date of Consult: 04/18/2015 Primary Physician: Golden Pop, MD Primary Cardiologist: New to [facility]  Chief Complaint: Routine colonoscopy Reason for Consult: new onset Afib with RVR  HPI: 70 y.o. male with h/o HTN, hypothyroidism, and prostate CA who was being seen by GI for routine colonoscopy and noted to be in new onset Afib with RVR with heart rates in the 180s today. Cardiology is consulted for further evaluation.   He has no previously known cardiac history. No new medications other than bowel prep for his colonoscopy. Patient presented to endoscopy today in his usual state of health and was feeling fine. He made it through triage without any issues. Upon anesthesia applying the monitor to him he was found to be in new onset Afib with RVR with heart rate in the 180s. He was completely asymptomatic. He was sent to the ED for further evaluation. Upon his arrival he was found to have troponin <0.03, BNP 281, free T4 eleavted at 1.43, K+ 3.6. He received IV diltiazem with some improvement in HR to the 1-teens. With minimal movement in the bed his heart rate goes back in to the 130's. His diltiazem has been recently titrated to 7.5 mg/hr. BP is in the low [id number] systolic.    Past Medical History  Diagnosis Date  . Dupuytren's contracture   . Hypertension   . Cancer ([place])     prostate,skin of the scalp  . Nephrolithiasis   . A-fib ([place]) 03/2015      Most Recent Cardiac Studies: none   Surgical History:  Past Surgical History  Procedure Laterality Date  . Radioactive seed implant N/A      Home Meds: Prior to Admission medications   Medication Sig Start Date End Date Taking? Authorizing Provider  amLODipine (NORVASC) 10 MG tablet Take 1 tablet (10 mg total) by mouth daily. 03/07/15  Yes Guadalupe Maple, MD  aspirin 81 MG tablet Take 81 mg by mouth daily.    Yes Historical Provider, MD  benazepril (LOTENSIN) 40 MG tablet Take 1 tablet (40 mg total) by mouth daily. 03/07/15  Yes Guadalupe Maple, MD  levothyroxine (SYNTHROID, LEVOTHROID) 75 MCG tablet TAKE ONE TABLET BY MOUTH ONCE DAILY 09/16/14  Yes Guadalupe Maple, MD  Magnesium Citrate 100 MG TABS Take 1 capsule by mouth daily.   Yes Historical Provider, MD  Multiple Vitamin (MULTIVITAMIN) tablet Take 1 tablet by mouth daily.   Yes Historical Provider, MD  Selenium 200 MCG CAPS Take 1 capsule by mouth daily.   Yes Historical Provider, MD    Inpatient Medications:  . apixaban  5 mg Oral BID  . aspirin EC  325 mg Oral Daily  . diltiazem  10 mg Intravenous Once  . insulin aspart  0-15 Units Subcutaneous TID WC  . insulin aspart  0-5 Units Subcutaneous QHS  . [START ON 04/19/2015] levothyroxine  50 mcg Oral QAC breakfast  . metoprolol tartrate  25 mg Oral Q6H  . sodium chloride flush  3 mL Intravenous Q12H   . diltiazem (CARDIZEM) infusion 7.5 mg/hr (04/18/15 1531)    Allergies: No Known Allergies  Social History   Social History  . Marital Status: Married    Spouse Name: N/A  . Number of Children: N/A  . Years of Education: N/A   Occupational History  . Not on file.   Social History Main Topics  .  Smoking status: Former Smoker    Types: Cigarettes    Quit date: 09/05/1994  . Smokeless tobacco: Former Systems developer    Types: Chew    Quit date: 09/04/2004  . Alcohol Use: No  . Drug Use: No  . Sexual Activity: Not on file   Other Topics Concern  . Not on file   Social History Narrative     Family History  Problem Relation Age of Onset  . Heart attack Mother   . Heart disease Father      Review of Systems: Review of Systems  Constitutional: Negative for fever, chills, weight loss, malaise/fatigue and diaphoresis.  HENT: Negative for congestion.   Eyes: Negative for discharge and redness.  Respiratory: Negative for cough, hemoptysis, sputum production, shortness of breath and  wheezing.   Cardiovascular: Negative for chest pain, palpitations, orthopnea, claudication, leg swelling and PND.  Gastrointestinal: Negative for heartburn, nausea, vomiting, abdominal pain, blood in stool and melena.  Genitourinary: Negative for hematuria.  Musculoskeletal: Negative for myalgias and falls.  Skin: Negative for rash.  Neurological: Negative for dizziness, tingling, tremors, sensory change, speech change, focal weakness, loss of consciousness and weakness.  Endo/Heme/Allergies: Does not bruise/bleed easily.  Psychiatric/Behavioral: Negative for substance abuse. The patient is not nervous/anxious.   All other systems reviewed and are negative.    Labs:  Recent Labs  04/18/15 1021 04/18/15 1431  TROPONINI <0.03 <0.03   Lab Results  Component Value Date   WBC 6.6 04/18/2015   HGB 16.3 04/18/2015   HCT 48.9 04/18/2015   MCV 93.8 04/18/2015   PLT 171 04/18/2015     Recent Labs Lab 04/18/15 1021  NA 141  K 3.6  CL 108  CO2 22  BUN 14  CREATININE 1.07  CALCIUM 8.9  GLUCOSE 111*   Lab Results  Component Value Date   CHOL 154 09/05/2014   HDL 55 09/05/2014   LDLCALC 89 09/05/2014   TRIG 50 09/05/2014   No results found for: DDIMER  Radiology/Studies:  Dg Chest Port 1 View  04/18/2015  CLINICAL DATA:  Dyspnea, new onset of atrial fibrillation, palpitations EXAM: PORTABLE CHEST 1 VIEW COMPARISON:  None. FINDINGS: Borderline cardiomegaly. No acute infiltrate or pleural effusion. No pulmonary edema. Mild degenerative changes thoracic spine. IMPRESSION: No active disease.  Borderline cardiomegaly. Electronically Signed   By: Lahoma Crocker M.D.   On: 04/18/2015 10:49    EKG: Afib with RVR, 124 bpm, left axis deviation, left anterior fascicular block, no significant st/t changes.   Weights: Filed Weights   04/18/15 1540  Weight: 229 lb (103.874 kg)     Physical Exam: Blood pressure 132/97, pulse 28, temperature 97.6 F (36.4 C), temperature source Oral,  resp. rate 19, height 6\' 1"  (1.854 m), weight 229 lb (103.874 kg), SpO2 99 %. Body mass index is 30.22 kg/(m^2). General: Well developed, well nourished, in no acute distress. Head: Normocephalic, atraumatic, sclera non-icteric, no xanthomas, nares are without discharge.  Neck: Negative for carotid bruits. JVD not elevated. Lungs: Clear bilaterally to auscultation without wheezes, rales, or rhonchi. Breathing is unlabored. Heart: Tachycardic, irregularly-irregular with S1 S2. No murmurs, rubs, or gallops appreciated. Abdomen: Soft, non-tender, non-distended with normoactive bowel sounds. No hepatomegaly. No rebound/guarding. No obvious abdominal masses. Msk:  Strength and tone appear normal for age. Extremities: No clubbing or cyanosis. No edema.  Distal pedal pulses are 2+ and equal bilaterally. Neuro: Alert and oriented X 3. No facial asymmetry. No focal deficit. Moves all extremities spontaneously. Psych:  Responds  to questions appropriately with a normal affect.    Assessment and Plan:   1. New onset Afib with RVR:  -Still with heart rate in the 130's -On diltiazem gtt, just titrated up 7.5 mg/hr. If his BP tolerates this and has room titrate up to 10 mg/hr -Add Lopressor 25 mg q 6 hours with hold parameters  -Check echo -Start Eliquis 5 mg bid -If heart continues to be poorly rate controlled he may need DCCV prior to discharge vs as an outpatient if he is rate controlled as he is asymptomatic  -CHADS2VASc at least 2 (HTN, age x 1)   2. Hypothyroidism with elevated free T4:  -Possibly leading to #1   3. Colonoscopy prep:  -Also possibly leading to #1  4. Hypomagnesemia: -Replete to goal of 2.0  Melvern Banker, PA-C Pager: [phone] 04/18/2015, 4:00 PM

## 2015-04-18 NOTE — ED Provider Notes (Signed)
[facility] Emergency Department Provider Note     Time seen: ----------------------------------------- 10:21 AM on 04/18/2015 -----------------------------------------    I have reviewed the triage vital signs and the nursing notes.   HISTORY  Chief Complaint No chief complaint on file.    HPI [name] [name] is a 70 y.o. male who presents ER fast heart rate. Patient states he just finished bowel prep to have a colonoscopy and it was noted today that his heart was racing at the endoscopy suite. Patient denies any complaints, denies fevers, chills, chest pain, shortness of breath, nausea vomiting or diarrhea. Patient denies any recent changes in his medicines other than completing the bowel prep. Patient does have a history of hypothyroidism and takes synthetic thyroid hormone.   Past Medical History  Diagnosis Date  . Dupuytren's contracture   . Hypertension   . Cancer ([place])     prostate,skin of the scalp  . Nephrolithiasis     Patient Active Problem List   Diagnosis Date Noted  . Hypertension 09/05/2014  . Dupuytren's contracture 09/05/2014  . Hypothyroidism 09/05/2014  . Malignant neoplasm of prostate ([place]) 09/05/2014    Past Surgical History  Procedure Laterality Date  . Radioactive seed implant N/A     Allergies Review of patient's allergies indicates no known allergies.  Social History Social History  Substance Use Topics  . Smoking status: Former Smoker    Types: Cigarettes    Quit date: 09/05/1994  . Smokeless tobacco: Former Systems developer    Types: Chew    Quit date: 09/04/2004  . Alcohol Use: No    Review of Systems Constitutional: Negative for fever. Eyes: Negative for visual changes. ENT: Negative for sore throat. Cardiovascular: Negative for chest pain. Respiratory: Negative for shortness of breath. Gastrointestinal: Negative for abdominal pain, vomiting and diarrhea. Genitourinary: Negative for  dysuria. Musculoskeletal: Negative for back pain. Skin: Negative for rash. Neurological: Negative for headaches, focal weakness or numbness.  10-point ROS otherwise negative.  ____________________________________________   PHYSICAL EXAM:  VITAL SIGNS: ED Triage Vitals  Enc Vitals Group     BP --      Pulse --      Resp --      Temp --      Temp src --      SpO2 --      Weight --      Height --      Head Cir --      Peak Flow --      Pain Score --      Pain Loc --      Pain Edu? --      Excl. in [place]? --     Constitutional: Alert and oriented. Well appearing and in no distress. Eyes: Conjunctivae are normal. PERRL. Normal extraocular movements. ENT   Head: Normocephalic and atraumatic.   Nose: No congestion/rhinnorhea.   Mouth/Throat: Mucous membranes are moist.   Neck: No stridor. Cardiovascular: Irregularly irregular rhythm, Normal and symmetric distal pulses are present in all extremities. No murmurs, rubs, or gallops. Respiratory: Normal respiratory effort without tachypnea nor retractions. Breath sounds are clear and equal bilaterally. No wheezes/rales/rhonchi. Gastrointestinal: Soft and nontender. No distention. No abdominal bruits.  Musculoskeletal: Nontender with normal range of motion in all extremities. No joint effusions.  No lower extremity tenderness nor edema. Neurologic:  Normal speech and language. No gross focal neurologic deficits are appreciated. Speech is normal. Skin:  Skin is warm, dry and intact. No rash noted. Psychiatric:  Mood and affect are normal. Speech and behavior are normal. Patient exhibits appropriate insight and judgment. ____________________________________________  EKG: Interpreted by me. Atrial fibrillation with a rapid ventricular response. Rate is 170 bpm. Normal axis.  ____________________________________________  ED COURSE:  Pertinent labs & imaging results that were available during my care of the patient were  reviewed by me and considered in my medical decision making (see chart for details). Patient in new-onset atrial fibrillation. I will give him IV Cardizem, fluids and check cardiac and thyroid labs. ____________________________________________    LABS (pertinent positives/negatives)  Labs Reviewed  CBC WITH DIFFERENTIAL/PLATELET - Abnormal; Notable for the following:    RDW 14.8 (*)    All other components within normal limits  BASIC METABOLIC PANEL - Abnormal; Notable for the following:    Glucose, Bld 111 (*)    All other components within normal limits  BRAIN NATRIURETIC PEPTIDE - Abnormal; Notable for the following:    B Natriuretic Peptide 281.0 (*)    All other components within normal limits  T4, FREE - Abnormal; Notable for the following:    Free T4 1.43 (*)    All other components within normal limits  TROPONIN I   CRITICAL CARE Performed by: Earleen Newport   Total critical care time: 30 minutes  Critical care time was exclusive of separately billable procedures and treating other patients.  Critical care was necessary to treat or prevent imminent or life-threatening deterioration.  Critical care was time spent personally by me on the following activities: development of treatment plan with patient and/or surrogate as well as nursing, discussions with consultants, evaluation of patient's response to treatment, examination of patient, obtaining history from patient or surrogate, ordering and performing treatments and interventions, ordering and review of laboratory studies, ordering and review of radiographic studies, pulse oximetry and re-evaluation of patient's condition.   RADIOLOGY  Chest x-ray Is unremarkable ____________________________________________  FINAL ASSESSMENT AND PLAN  New-onset atrial fibrillation  Plan: Patient with labs and imaging as dictated above. Patient with new onset A. fib, possibly related to high Synthroid dosage. I will  recommend admission, he is received 2 doses of IV Cardizem, 1 L fluid. He has not converted to normal sinus rhythm. I discussed with the hospitalist for admission.   Earleen Newport, MD   Earleen Newport, MD 04/18/15 [phone]

## 2015-04-19 ENCOUNTER — Encounter: Payer: Self-pay | Admitting: Gastroenterology

## 2015-04-19 ENCOUNTER — Telehealth: Payer: Self-pay

## 2015-04-19 DIAGNOSIS — E038 Other specified hypothyroidism: Secondary | ICD-10-CM

## 2015-04-19 DIAGNOSIS — E876 Hypokalemia: Secondary | ICD-10-CM | POA: Diagnosis not present

## 2015-04-19 DIAGNOSIS — I4891 Unspecified atrial fibrillation: Secondary | ICD-10-CM | POA: Diagnosis not present

## 2015-04-19 DIAGNOSIS — I10 Essential (primary) hypertension: Secondary | ICD-10-CM

## 2015-04-19 LAB — CBC
HEMATOCRIT: 45.7 % (ref 40.0–52.0)
HEMOGLOBIN: 15.2 g/dL (ref 13.0–18.0)
MCH: 30.8 pg (ref 26.0–34.0)
MCHC: 33.3 g/dL (ref 32.0–36.0)
MCV: 92.7 fL (ref 80.0–100.0)
Platelets: 164 10*3/uL (ref 150–440)
RBC: 4.93 MIL/uL (ref 4.40–5.90)
RDW: 14.6 % — AB (ref 11.5–14.5)
WBC: 6.6 10*3/uL (ref 3.8–10.6)

## 2015-04-19 LAB — GLUCOSE, CAPILLARY
Glucose-Capillary: 97 mg/dL (ref 65–99)
Glucose-Capillary: 112 mg/dL — ABNORMAL HIGH (ref 65–99)

## 2015-04-19 LAB — TROPONIN I: Troponin I: <0.03 ng/mL (ref <0.031)

## 2015-04-19 MED ORDER — DILTIAZEM HCL ER 90 MG PO CP12
90.0000 mg | ORAL_CAPSULE | Freq: Two times a day (BID) | ORAL | Status: DC
  Administered 2015-04-19: 90 mg via ORAL
  Filled 2015-04-19 (×2): qty 1

## 2015-04-19 MED ORDER — METOPROLOL TARTRATE 50 MG PO TABS
50.0000 mg | ORAL_TABLET | Freq: Two times a day (BID) | ORAL | Status: DC
  Administered 2015-04-19: 50 mg via ORAL
  Filled 2015-04-19: qty 1

## 2015-04-19 MED ORDER — LEVOTHYROXINE SODIUM 50 MCG PO TABS: 50.0000 ug | ORAL_TABLET | Freq: Every day | ORAL | Status: DC

## 2015-04-19 MED ORDER — DILTIAZEM HCL ER 90 MG PO CP12: 90.0000 mg | ORAL_CAPSULE | Freq: Two times a day (BID) | ORAL | Status: DC

## 2015-04-19 MED ORDER — METOPROLOL TARTRATE 50 MG PO TABS: 50.0000 mg | ORAL_TABLET | Freq: Two times a day (BID) | ORAL | Status: DC

## 2015-04-19 MED ORDER — APIXABAN 5 MG PO TABS: 5.0000 mg | ORAL_TABLET | Freq: Two times a day (BID) | ORAL | Status: DC

## 2015-04-19 NOTE — Progress Notes (Signed)
Automatic high fall risk for cardizem drip. Refusing bed alarm. Educated on safety. Agreed to call before getting up.

## 2015-04-19 NOTE — Telephone Encounter (Signed)
-----   Message from Blain Pais sent at 04/19/2015 11:32 AM EST ----- Regarding: tcm/ph 04/28/2015 Christell Faith, PA 10:30

## 2015-04-19 NOTE — Progress Notes (Signed)
Dr. Benjie Karvonen and Dr. Ellyn Hack aware of patient's heart rate reaching 110's while ambulating. Per both MDs, still ok to proceed with discharge and follow-up outpatient with cardiology in 1-2 weeks.

## 2015-04-19 NOTE — Telephone Encounter (Signed)
Attempted to contact pt regarding discharge from [facility] on 04/19/15. Left message asking pt to call back regarding discharge instructions and/or medications. Advised pt of appt w/ Christell Faith, PA on 04/28/15 at 10:30 w/ [facility]. Asked pt to call back if unable to keep this appt.

## 2015-04-19 NOTE — Discharge Summary (Signed)
[facility] [place] NAME: [name] [name]    MR#:  [id number]  DATE OF BIRTH:  [birth year]  DATE OF ADMISSION:  04/18/2015 ADMITTING PHYSICIAN: Bettey Costa, MD  DATE OF DISCHARGE: 04/19/2015 PRIMARY CARE PHYSICIAN: Golden Pop, MD    ADMISSION DIAGNOSIS:  Atrial fibrillation, unspecified type ([place]) [I48.91]  DISCHARGE DIAGNOSIS:  Principal Problem:   New onset atrial fibrillation [facility]) Active Problems:   Essential hypertension   Hypothyroidism   Atrial fibrillation with RVR ([place])   Hypokalemia   SECONDARY DIAGNOSIS:   Past Medical History  Diagnosis Date  . Dupuytren's contracture   . Hypertension   . Cancer ([place])     prostate,skin of the scalp  . Nephrolithiasis   . A-fib ([place]) 03/2015    HOSPITAL COURSE:  70 year old male with a history of essential hypertension who presents with new onset atrial fibrillation.  1. New onset atrial fibrillation with RVR: Patient was initially started on diltiazem drip. Cardiology was consulted. He underwent 2-D echocardiogram which showed ejection fraction of 50-55% with enlarged left ventricle. He has a chads2VASC score of 2 and therefore was started ELIQUIS 5 mg by mouth twice a day. Diltiazem drip was weaned off and patient was started on metoprolol and diltiazem. Heart rate is better controlled. Plan is for outpatient reassessment and consider cardioversion in 4 weeks time followed by 4 weeks of on interrupted ELIQUIS. As per cardiology hold aspirin pending evaluation for possible CAD. Plan is to consider outpatient Myoview once patient is sinus rhythm.  2. Hypothyroidism: Free T4 is elevated and therefore Synthroid was decreased. Patient needs repeat TFTs in 4-6 weeks.   3. Essential hypertension: Patient is now on metoprolol and diltiazem. He will discontinue Norvasc and Vasotec. DISCHARGE CONDITIONS AND DIET:  Patient stable for discharge on a heart healthy diet  CONSULTS  OBTAINED:  Treatment Team:  Minna Merritts, MD  DRUG ALLERGIES:  No Known Allergies  DISCHARGE MEDICATIONS:   Current Discharge Medication List    START taking these medications   Details  apixaban (ELIQUIS) 5 MG TABS tablet Take 1 tablet (5 mg total) by mouth 2 (two) times daily. Qty: 60 tablet, Refills: 0    diltiazem (CARDIZEM SR) 90 MG 12 hr capsule Take 1 capsule (90 mg total) by mouth every 12 (twelve) hours. Qty: 60 capsule, Refills: 0    metoprolol (LOPRESSOR) 50 MG tablet Take 1 tablet (50 mg total) by mouth 2 (two) times daily. Qty: 60 tablet, Refills: 0      CONTINUE these medications which have CHANGED   Details  levothyroxine (SYNTHROID, LEVOTHROID) 50 MCG tablet Take 1 tablet (50 mcg total) by mouth daily before breakfast. Qty: 30 tablet, Refills: 0      CONTINUE these medications which have NOT CHANGED   Details  Magnesium Citrate 100 MG TABS Take 1 capsule by mouth daily.    Multiple Vitamin (MULTIVITAMIN) tablet Take 1 tablet by mouth daily.    Selenium 200 MCG CAPS Take 1 capsule by mouth daily.      STOP taking these medications     amLODipine (NORVASC) 10 MG tablet      aspirin 81 MG tablet      benazepril (LOTENSIN) 40 MG tablet               Today   CHIEF COMPLAINT:  Patient is doing well this point. Patient denies palpitation or shortness of breath.   VITAL SIGNS:  Blood pressure 118/78,  pulse 103, temperature 97.9 F (36.6 C), temperature source Oral, resp. rate 18, height 6\' 1"  (1.854 m), weight 103.874 kg (229 lb), SpO2 97 %.   REVIEW OF SYSTEMS:  Review of Systems  Constitutional: Negative for fever, chills and malaise/fatigue.  HENT: Negative for sore throat.   Eyes: Negative for blurred vision.  Respiratory: Negative for cough, hemoptysis, shortness of breath and wheezing.   Cardiovascular: Negative for chest pain, palpitations and leg swelling.  Gastrointestinal: Negative for nausea, vomiting, abdominal pain,  diarrhea and blood in stool.  Genitourinary: Negative for dysuria.  Musculoskeletal: Negative for back pain.  Neurological: Negative for dizziness, tremors and headaches.  Endo/Heme/Allergies: Does not bruise/bleed easily.     PHYSICAL EXAMINATION:  GENERAL:  70 y.o.-year-old patient lying in the bed with no acute distress.  NECK:  Supple, no jugular venous distention. No thyroid enlargement, no tenderness.  LUNGS: Normal breath sounds bilaterally, no wheezing, rales,rhonchi  No use of accessory muscles of respiration.  CARDIOVASCULAR: irr, irr no murmurs, rubs, or gallops.  ABDOMEN: Soft, non-tender, non-distended. Bowel sounds present. No organomegaly or mass.  EXTREMITIES: No pedal edema, cyanosis, or clubbing.  PSYCHIATRIC: The patient is alert and oriented x 3.  SKIN: No obvious rash, lesion, or ulcer.   DATA REVIEW:   CBC  Recent Labs Lab 04/19/15 0529  WBC 6.6  HGB 15.2  HCT 45.7  PLT 164    Chemistries   Recent Labs Lab 04/18/15 1021 04/18/15 1023  NA 141  --   K 3.6  --   CL 108  --   CO2 22  --   GLUCOSE 111*  --   BUN 14  --   CREATININE 1.07  --   CALCIUM 8.9  --   MG  --  1.7    Cardiac Enzymes  Recent Labs Lab 04/18/15 1431 04/18/15 2249 04/19/15 0529  TROPONINI <0.03 <0.03 <0.03    Microbiology Results  @[email or web address] @  RADIOLOGY:  Dg Chest Port 1 View  04/18/2015  CLINICAL DATA:  Dyspnea, new onset of atrial fibrillation, palpitations EXAM: PORTABLE CHEST 1 VIEW COMPARISON:  None. FINDINGS: Borderline cardiomegaly. No acute infiltrate or pleural effusion. No pulmonary edema. Mild degenerative changes thoracic spine. IMPRESSION: No active disease.  Borderline cardiomegaly. Electronically Signed   By: Lahoma Crocker M.D.   On: 04/18/2015 10:49      Management plans discussed with the patient and he is in agreement. Stable for discharge home  Patient should follow up with cardiology in 1 week and PCP   CODE STATUS:     Code Status  Orders        Start     Ordered   04/18/15 1424  Full code   Continuous     04/18/15 1423    Code Status History    Date Active Date Inactive Code Status Order ID Comments User Context   This patient has a current code status but no historical code status.    Advance Directive Documentation        Most Recent Value   Type of Advance Directive  Living will   Pre-existing out of facility DNR order (yellow form or pink MOST form)     "MOST" Form in Place?        TOTAL TIME TAKING CARE OF THIS PATIENT: 35 minutes.    Note: This dictation was prepared with Dragon dictation along with smaller phrase technology. Any transcriptional errors that result from this process are unintentional.  Pearce Littlefield M.D  on 04/19/2015 at 12:12 PM  Between 7am to 6pm - Pager - [phone] After 6pm go to [email or web address] - password [email or web address] [facility]  Office  [phone]  CC: Primary care physician; Golden Pop, MD

## 2015-04-19 NOTE — Progress Notes (Signed)
Refusing assistance in the bathroom. Said he will pull light when done so we can walk with him.

## 2015-04-19 NOTE — Progress Notes (Addendum)
Patient Name: [name] [name] Date of Encounter: 04/19/2015  Hospital Problem List     Active Problems:   Atrial fibrillation [facility])   Atrial fibrillation with RVR (HCC)   Hypokalemia   Thyroid activity decreased    Subjective   Remains asymptomatic. No complaints. Denies any rapid irregular heartbeat sensation, chest tightness/pressure or dyspnea.  Inpatient Medications    . apixaban  5 mg Oral BID  . aspirin EC  325 mg Oral Daily  . diltiazem  90 mg Oral Q12H  . diltiazem  10 mg Intravenous Once  . insulin aspart  0-15 Units Subcutaneous TID WC  . insulin aspart  0-5 Units Subcutaneous QHS  . levothyroxine  50 mcg Oral QAC breakfast  . metoprolol tartrate  25 mg Oral Q6H  . sodium chloride flush  3 mL Intravenous Q12H    Vital Signs    Filed Vitals:   04/18/15 2137 04/18/15 2347 04/19/15 0437 04/19/15 0821  BP: 120/93 101/74 108/73 102/69  Pulse: 101 86 85 70  Temp: 97.5 F (36.4 C) 97.9 F (36.6 C) 98.2 F (36.8 C)   TempSrc: Oral Oral Oral   Resp: 20 20    Height:      Weight:      SpO2: 100% 94% 94%     Intake/Output Summary (Last 24 hours) at 04/19/15 0942 Last data filed at 04/19/15 0730  Gross per 24 hour  Intake      0 ml  Output   1025 ml  Net  -1025 ml   Filed Weights   04/18/15 1540  Weight: 229 lb (103.874 kg)    Physical Exam    General: Pleasant, NAD. Healthy-appearing Neuro: Alert and oriented X 3. Moves all extremities spontaneously. Psych: Normal mood and affect. HEENT: [place]/AT, EOMI, MMM, anicteric sclera  Neck: Supple without bruits or JVD. Lungs:  Resp regular and unlabored, CTA. Heart: Irregularly irregular - borderline tachycardic; somewhat distantnormal S1-S2, no s3, s4, or murmurs. Nondispersed PMI Abdomen: Soft, non-tender, non-distended, BS + x 4.  Extremities: No clubbing, cyanosis or edema. DP/PT/Radials 2+ and equal bilaterally.  Labs    CBC  Recent Labs  04/18/15 1021 04/19/15 0529  WBC 6.6 6.6  NEUTROABS  4.7  --   HGB 16.3 15.2  HCT 48.9 45.7  MCV 93.8 92.7  PLT 171 [id number]   Basic Metabolic Panel  Recent Labs  04/18/15 1021 04/18/15 1023  NA 141  --   K 3.6  --   CL 108  --   CO2 22  --   GLUCOSE 111*  --   BUN 14  --   CREATININE 1.07  --   CALCIUM 8.9  --   MG  --  1.7   Liver Function Tests No results for input(s): AST, ALT, ALKPHOS, BILITOT, PROT, ALBUMIN in the last 72 hours. No results for input(s): LIPASE, AMYLASE in the last 72 hours. Cardiac Enzymes  Recent Labs  04/18/15 1431 04/18/15 2249 04/19/15 0529  TROPONINI <0.03 <0.03 <0.03   BNP Invalid input(s): POCBNP D-Dimer No results for input(s): DDIMER in the last 72 hours. Hemoglobin A1C No results for input(s): HGBA1C in the last 72 hours. Fasting Lipid Panel No results for input(s): CHOL, HDL, LDLCALC, TRIG, CHOLHDL, LDLDIRECT in the last 72 hours. Thyroid Function Tests  Recent Labs  04/18/15 1431  TSH 2.074    Telemetry    A. fib with rates ranging from 80-100. Increases with activity to 110s.  ECG  No new EKG  Radiology    Dg Chest Port 1 View  04/18/2015  CLINICAL DATA:  Dyspnea, new onset of atrial fibrillation, palpitations EXAM: PORTABLE CHEST 1 VIEW COMPARISON:  None. FINDINGS: Borderline cardiomegaly. No acute infiltrate or pleural effusion. No pulmonary edema. Mild degenerative changes thoracic spine. IMPRESSION: No active disease.  Borderline cardiomegaly. Electronically Signed   By: Lahoma Crocker M.D.   On: 04/18/2015 10:49   reviewed.  Assessment & Plan    Principal Problem:  New onset atrial fibrillation (HCC) /  Atrial fibrillation with RVR (HCC) -- relatively asymptomatic.   Rate control improved at rest. Increases with exertion; troponin negative  Started on ELIQUIS for anticoagulation  Is on low-dose beta blocker and IV diltiazem drip -> will convert to oral diltiazem 90 twice a day, and convert beta blocker to 50 mg twice a day => following discontinuation of  diltiazem drip, will ask him to get up walking around to reassess rate control. Resting rates of roughly 80 bpm and a nuclear rates of less than 120 are acceptable.  If rate is adequately controlled today, would anticipate discharge later on this afternoon on ELIQUIS.  Plan is for outpatient reassessment and consider cardioversion in 4 weeks time followed by formal weeks of uninterrupted ELIQUIS.  Would hold aspirin pending evaluation for possible CAD  Would consider outpatient Myoview, preferentially once in sinus rhythm    Hypothyroidism: He takes thyroid supplementation, I would error on the side of under treatment as opposed to over treatment in the setting of A. Fib    Hypokalemia: Replete potassium - likely related to bowel prep    Essential hypertension: He was on an ACE inhibitor prior to arrival - will need to hold on d/c to allow for BB & CCB. - borderline BP here  Would expect d/c later today (on BB, CCB & Eliquis) --- will arrange close OP f/u @ our [facility].   Signed, Leonie Man, M.D., M.S.  [facility]  [address] [place], [place] [address] [phone] Fax [phone]

## 2015-04-19 NOTE — Progress Notes (Signed)
Patient ambulated 3 laps around nurses' station with NT - no complaints or discomfort. Resting in room now, eating lunch. Afib 80-90s'. Will proceed with discharge orders after he finishes lunch. Wife to pick him up.

## 2015-04-19 NOTE — Progress Notes (Signed)
Pt restless and uncomfortable in bed. Per Dr. Candis Musa patient is ok to walk around unit with staff. Pt completed two laps around nurses station and sat in chair for about 2 hours. Tolerated well. No changes in heart rate or rhythm. Will continue to monitor.

## 2015-04-19 NOTE — Progress Notes (Signed)
Patient given discharge teaching and paperwork regarding medications, diet, follow-up appointments and activity. Patient understanding verbalized. No complaints at this time. IV and telemetry discontinued prior to leaving. Skin assessment as previously charted and vitals are stable; on room air. Patient being discharged to home. Caregiver/family present during discharge teaching.Prescriptions sent to [facility]. Patient escorted via wheelchair by volunteer to ride home with wife.

## 2015-04-27 ENCOUNTER — Ambulatory Visit (INDEPENDENT_AMBULATORY_CARE_PROVIDER_SITE_OTHER): Payer: PPO | Admitting: Family Medicine

## 2015-04-27 ENCOUNTER — Encounter: Payer: Self-pay | Admitting: Family Medicine

## 2015-04-27 VITALS — BP 132/86 | HR 92 | Temp 98.1°F | Ht 72.6 in | Wt 236.0 lb

## 2015-04-27 DIAGNOSIS — I4891 Unspecified atrial fibrillation: Secondary | ICD-10-CM

## 2015-04-27 DIAGNOSIS — R0683 Snoring: Secondary | ICD-10-CM | POA: Diagnosis not present

## 2015-04-27 NOTE — Assessment & Plan Note (Signed)
Has appointment with cardiology will also check for sleep apnea as that may be aggravating atrial fibrillation

## 2015-04-27 NOTE — Progress Notes (Signed)
BP 132/86 mmHg  Pulse 92  Temp(Src) 98.1 F (36.7 C)  Ht 6' 0.6" (1.844 m)  Wt 236 lb (107.049 kg)  BMI 31.48 kg/m2  SpO2 99%   Subjective:    Patient ID: [name] [name], male    DOB: Mar 09, 1946, 70 y.o.   MRN: [id number]  HPI: [name] is a 70 y.o. male  Chief Complaint  Patient presents with  . Hospitalization Follow-up   patient with new onset atrial fibrillation cardiac workup is been negative taking L at [facility] without problems no bruising or other issues Has appointment with cardiology tomorrow. Patient's wife is also been tend into his poor sleep with marked snoring sleeping observed apneic spells and other sleep apnea symptoms this may be contributing to atrial fibrillation. We'll schedule sleep study  Relevant past medical, surgical, family and social history reviewed and updated as indicated. Interim medical history since our last visit reviewed. Allergies and medications reviewed and updated.  Review of Systems  Constitutional: Negative.   Respiratory: Negative.   Cardiovascular: Negative.     Per HPI unless specifically indicated above     Objective:    BP 132/86 mmHg  Pulse 92  Temp(Src) 98.1 F (36.7 C)  Ht 6' 0.6" (1.844 m)  Wt 236 lb (107.049 kg)  BMI 31.48 kg/m2  SpO2 99%  Wt Readings from Last 3 Encounters:  04/27/15 236 lb (107.049 kg)  04/18/15 229 lb (103.874 kg)  04/18/15 230 lb (104.327 kg)    Physical Exam  Constitutional: He is oriented to person, place, and time. He appears well-developed and well-nourished. No distress.  HENT:  Head: Normocephalic and atraumatic.  Right Ear: Hearing normal.  Left Ear: Hearing normal.  Nose: Nose normal.  Eyes: Conjunctivae and lids are normal. Right eye exhibits no discharge. Left eye exhibits no discharge. No scleral icterus.  Cardiovascular: Normal rate, regular rhythm and normal heart sounds.   Pulmonary/Chest: Effort normal and breath sounds normal. No respiratory distress.   Musculoskeletal: Normal range of motion.  Neurological: He is alert and oriented to person, place, and time.  Skin: Skin is intact. No rash noted.  Psychiatric: He has a normal mood and affect. His speech is normal and behavior is normal. Judgment and thought content normal. Cognition and memory are normal.    Results for orders placed or performed during the hospital encounter of 04/18/15  TSH  Result Value Ref Range   TSH 2.074 0.350 - 4.500 uIU/mL  Troponin I  Result Value Ref Range   Troponin I <0.03 <0.031 ng/mL  Troponin I  Result Value Ref Range   Troponin I <0.03 <0.031 ng/mL  Troponin I  Result Value Ref Range   Troponin I <0.03 <0.031 ng/mL  Magnesium  Result Value Ref Range   Magnesium 1.7 1.7 - 2.4 mg/dL  Glucose, capillary  Result Value Ref Range   Glucose-Capillary 89 65 - 99 mg/dL   Comment 1 Notify RN   CBC  Result Value Ref Range   WBC 6.6 3.8 - 10.6 K/uL   RBC 4.93 4.40 - 5.90 MIL/uL   Hemoglobin 15.2 13.0 - 18.0 g/dL   HCT 45.7 40.0 - 52.0 %   MCV 92.7 80.0 - 100.0 fL   MCH 30.8 26.0 - 34.0 pg   MCHC 33.3 32.0 - 36.0 g/dL   RDW 14.6 (H) 11.5 - 14.5 %   Platelets 164 150 - 440 K/uL  Glucose, capillary  Result Value Ref Range   Glucose-Capillary 73 65 -  99 mg/dL  Glucose, capillary  Result Value Ref Range   Glucose-Capillary 97 65 - 99 mg/dL   Comment 1 Notify RN   Glucose, capillary  Result Value Ref Range   Glucose-Capillary 112 (H) 65 - 99 mg/dL   Comment 1 Notify RN       Assessment & Plan:   Problem List Items Addressed This Visit      Cardiovascular and Mediastinum   New onset atrial fibrillation ([place]) - Primary    Has appointment with cardiology will also check for sleep apnea as that may be aggravating atrial fibrillation        Other   Snoring    Patient with a time drowsiness observed apnea snoring new-onset atrial fibrillation will schedule for sleep study      Relevant Orders   Ambulatory referral to Sleep Studies        Follow up plan: Return for Has appointment for follow-up this summer.

## 2015-04-27 NOTE — Assessment & Plan Note (Signed)
Patient with a time drowsiness observed apnea snoring new-onset atrial fibrillation will schedule for sleep study

## 2015-04-28 ENCOUNTER — Encounter: Payer: Self-pay | Admitting: Physician Assistant

## 2015-04-28 ENCOUNTER — Ambulatory Visit (INDEPENDENT_AMBULATORY_CARE_PROVIDER_SITE_OTHER): Payer: PPO | Admitting: Physician Assistant

## 2015-04-28 VITALS — BP 104/80 | HR 92 | Ht 72.0 in | Wt 236.0 lb

## 2015-04-28 DIAGNOSIS — I4819 Other persistent atrial fibrillation: Secondary | ICD-10-CM

## 2015-04-28 DIAGNOSIS — E876 Hypokalemia: Secondary | ICD-10-CM

## 2015-04-28 DIAGNOSIS — I4891 Unspecified atrial fibrillation: Secondary | ICD-10-CM

## 2015-04-28 DIAGNOSIS — I481 Persistent atrial fibrillation: Secondary | ICD-10-CM

## 2015-04-28 DIAGNOSIS — E038 Other specified hypothyroidism: Secondary | ICD-10-CM | POA: Diagnosis not present

## 2015-04-28 DIAGNOSIS — Z01812 Encounter for preprocedural laboratory examination: Secondary | ICD-10-CM

## 2015-04-28 DIAGNOSIS — I10 Essential (primary) hypertension: Secondary | ICD-10-CM

## 2015-04-28 DIAGNOSIS — E669 Obesity, unspecified: Secondary | ICD-10-CM

## 2015-04-28 NOTE — Progress Notes (Signed)
Cardiology Office Note Date:  04/28/2015  Patient ID:  [name] [name] 31-Aug-1945, MRN [id number] PCP:  Golden Pop, MD  Cardiologist:  Dr. Rockey Situ, MD    Chief Complaint: Hospital follow up for new onset Afib with RVR  History of Present Illness: [name] [name] is a 70 y.o. male with history of recently diagnosed Afib with RVR with heart rates in the 180's in the setting of hypokalemia and colonoscopy prep, HTN, hypothyroidism, likely undiagnosed sleep apnea, and prostate cancer who presents for hospital follow up for the above Afib.   Prior to the above admission he had no previously known cardiac history. He was undergoing a bowel prep for a routine screening colonoscopy which he was at [facility] as an outpatient for on 1/31. He was in his usual state of health and feeling fine. Upon his arrival to [facility] and endoscopy he was noted to be in new onset Afib with RVR with HR in the 180's by the anesthesiologist. He was completely asymptomatic. Troponin was negative x 4. TSH 2.07,  free T4 was elevated at 1.43, K+ 3.6, Mg++ 1.7. He received IV diltiazem with improvement of HR into the 1-teens, with subsequent improvement in rate into the 80s on metoprolol. Echo showed EF 50-55%, no RWMA, not sufficient to allow for LV diastolic function, mildly dilated left atrium at 46 mm, mild MR, normal RV systolic function, and normal PASP. He was started on Eliquis given his CHADS2VASc of 2 (HTN and age x 1). He remained rate controlled and was discharged.   He saw his PCP on 2/9 and was doing well. He was referred out for a sleep study given his history of likely sleep apnea.   Today he continues to do well. He is asymptomatic. He is advancing his activity as tolerated. His weight is stable. He is tolerating his medications without issues and has not missed any doses of his Eliquis. His heart rate at home ranges from a low of 64 to a peak of 104 by electronic monitor.    Past Medical History  Diagnosis  Date  . Dupuytren's contracture   . Hypertension   . Nephrolithiasis   . Persistent atrial fibrillation ([name]) 03/2015    a. CHADS2VASc = 2 (HTN, age); b. on Eliquis  . Hypothyroidism   . Prostate cancer ([place])   . Mitral regurgitation     a. echo 03/2015: echo 03/2015: EF 50-55%, no RWMA, no sufficienct to allow for LV diastolic fxn, mild MR, LA mildly dilated at 46 mm, RV systolic fxn nl, PASP nl  . Cancer [facility])     scalp    Past Surgical History  Procedure Laterality Date  . Radioactive seed implant N/A   . Colonoscopy with propofol N/A 04/18/2015    Procedure: COLONOSCOPY WITH PROPOFOL;  Surgeon: Lucilla Lame, MD;  Location: [facility] ENDOSCOPY;  Service: Endoscopy;  Laterality: N/A;    Current Outpatient Prescriptions  Medication Sig Dispense Refill  . apixaban (ELIQUIS) 5 MG TABS tablet Take 1 tablet (5 mg total) by mouth 2 (two) times daily. 60 tablet 0  . diltiazem (CARDIZEM SR) 90 MG 12 hr capsule Take 1 capsule (90 mg total) by mouth every 12 (twelve) hours. 60 capsule 0  . levothyroxine (SYNTHROID, LEVOTHROID) 50 MCG tablet Take 1 tablet (50 mcg total) by mouth daily before breakfast. 30 tablet 0  . Magnesium Citrate 100 MG TABS Take 1 capsule by mouth daily.    . metoprolol (LOPRESSOR) 50 MG tablet Take 1  tablet (50 mg total) by mouth 2 (two) times daily. 60 tablet 0  . Multiple Vitamin (MULTIVITAMIN) tablet Take 1 tablet by mouth daily.    . Selenium 200 MCG CAPS Take 1 capsule by mouth daily.     No current facility-administered medications for this visit.    Allergies:   Review of patient's allergies indicates no known allergies.   Social History:  The patient  reports that he quit smoking about 20 years ago. His smoking use included Cigarettes. He quit smokeless tobacco use about 10 years ago. His smokeless tobacco use included Chew. He reports that he does not drink alcohol or use illicit drugs.   Family History:  The patient's family history includes Heart attack in his  mother; Heart disease in his father.  ROS:   Review of Systems  Constitutional: Negative for fever, chills, weight loss, malaise/fatigue and diaphoresis.  HENT: Negative for congestion.   Eyes: Negative for discharge and redness.  Respiratory: Negative for cough, hemoptysis, sputum production, shortness of breath and wheezing.   Cardiovascular: Negative for chest pain, palpitations, orthopnea, claudication, leg swelling and PND.  Gastrointestinal: Negative for heartburn, nausea, vomiting and abdominal pain.  Musculoskeletal: Negative for myalgias and falls.  Skin: Negative for rash.  Neurological: Negative for dizziness, sensory change, speech change, focal weakness, loss of consciousness and weakness.  Endo/Heme/Allergies: Does not bruise/bleed easily.  Psychiatric/Behavioral: Negative for substance abuse. The patient is not nervous/anxious.   All other systems reviewed and are negative.   PHYSICAL EXAM:  VS:  BP 104/80 mmHg  Pulse 92  Ht 6' (1.829 m)  Wt 236 lb (107.049 kg)  BMI 32.00 kg/m2 BMI: Body mass index is 32 kg/(m^2). Well nourished, well developed, in no acute distress HEENT: normocephalic, atraumatic Neck: no JVD, carotid bruits or masses Cardiac: irregularly-irregular, S1, S2; RRR; no murmurs, rubs, or gallops Lungs:  clear to auscultation bilaterally, no wheezing, rhonchi or rales Abd: soft, nontender, no hepatomegaly, + BS MS: no deformity or atrophy Ext: trace pre-tibial edema bilaterally  Skin: warm and dry, no rash Neuro:  moves all extremities spontaneously, no focal abnormalities noted, follows commands Psych: euthymic mood, full affect   EKG:  Was ordered today. Shows Afib, 92 bpm, left axis, poor R wave progression, nonspecific inferior st/t changes   Recent Labs: 09/05/2014: ALT 24 04/18/2015: B Natriuretic Peptide 281.0*; BUN 14; Creatinine, Ser 1.07; Magnesium 1.7; Potassium 3.6; Sodium 141; TSH 2.074 04/19/2015: Hemoglobin 15.2; Platelets 164    09/05/2014: Chol/HDL Ratio 2.8; Cholesterol, Total 154; HDL 55; LDL Calculated 89; Triglycerides 50   Estimated Creatinine Clearance: 81.2 mL/min (by C-G formula based on Cr of 1.07).   Wt Readings from Last 3 Encounters:  04/28/15 236 lb (107.049 kg)  04/27/15 236 lb (107.049 kg)  04/18/15 229 lb (103.874 kg)     Other studies reviewed: Additional studies/records reviewed today include: summarized above  ASSESSMENT AND PLAN:  1. Persistent Afib: Rate controlled at this time. Heart rate at rest is in the 60's while with ambulation his heart rate peaks in the 90's. His blood pressure precludes further titration of rate controlling medications at this time. He is asymptomatic. Schedule DCCV with Dr. Rockey Situ, MD. He has not missed any doses of Eliquis. Once sinus rhythm has been restored he can likely come off Cardizem. The Cardizem also appears to be leading to some mild pre-tibial edema. Given he will be undergoing DCCV the first part of next week (2/13 or 2/14), 3 to 4 days from now  we will continue this medication at this time at his request for rate control. In the future perhaps it would be best to avoid CCB for rate control or hypertension given the side effect of lower extremity swelling. Risks and benefits of DCCV and chemical cardioversion have been explained to him in detail. He prefers DCCV at this time. His likely diagnosis of sleep apnea is most likely playing a role in this as well. Should he go back into Afib because of this or because of his obesity or his dilated left atrium of 46 mm would look to load with amiodarone prior to next DCCV. Once sinus rhythm is restored he will need a nuclear stress test. Continue Eliquis 5 mg bid given CHADS2VASc of 2 (HTN and age x 1).   2. Hypothyroidism: Per PCP.   3. Hypokalemia: Check bmet. Replete to 4.0.   4. Hypomagnesemia: Replete to 2.0.  5. Obesity/likely sleep apnea: He has been referred out for a sleep study by PCP. If needed we can  assist with this.   6. HTN: Well controlled today. BP is actually somewhat soft at this time which precludes further titration of his rate controlling medication as above, though at rest his heart is well controlled and with ambulation his heart rate only appears to peak in the 90's at this time.   Disposition: F/u with Dr. Rockey Situ, MD status post DCCV  Current medicines are reviewed at length with the patient today.  The patient did not have any concerns regarding medicines.  Melvern Banker PA-C 04/28/2015 10:13 AM     [facility] [address] [place], [place] [address] [phone]

## 2015-04-28 NOTE — Patient Instructions (Addendum)
Medication Instructions:  Your physician recommends that you continue on your current medications as directed. Please refer to the Current Medication list given to you today.   Labwork: BMET, CBC, PT/INR  Testing/Procedures: Your physician has recommended that you have a Cardioversion (DCCV). Electrical Cardioversion uses a jolt of electricity to your heart either through paddles or wired patches attached to your chest. This is a controlled, usually prescheduled, procedure. Defibrillation is done under light anesthesia in the hospital, and you usually go home the day of the procedure. This is done to get your heart back into a normal rhythm. You are not awake for the procedure. Please see the instruction sheet given to you today.  Friday, Feb 17 Arrival time: 6:30am [facility] [employer] Please have someone with you who can drive you home.  Nothing to eat or drink after midnight except for enough water to take your morning medications safely.   Follow-Up: Your physician recommends that you schedule a follow-up appointment in: one week after Cardioversion Feb 16 Office visit for EKG to confirm rhythm.    Your physician recommends that you schedule a follow-up appointment one week after cardioversionElectrical Cardioversion Electrical cardioversion is the delivery of a jolt of electricity to change the rhythm of the heart. Sticky patches or metal paddles are placed on the chest to deliver the electricity from a device. This is done to restore a normal rhythm. A rhythm that is too fast or not regular keeps the heart from pumping well. Electrical cardioversion is done in an emergency if:   There is low or no blood pressure as a result of the heart rhythm.   Normal rhythm must be restored as fast as possible to protect the brain and heart from further damage.   It may save a life. Cardioversion may be done for heart rhythms that are not immediately life threatening, such as atrial  fibrillation or flutter, in which:   The heart is beating too fast or is not regular.   Medicine to change the rhythm has not worked.   It is safe to wait in order to allow time for preparation.  Symptoms of the abnormal rhythm are bothersome.  The risk of stroke and other serious problems can be reduced. LET [facility] CARE PROVIDER KNOW ABOUT:   Any allergies you have.  All medicines you are taking, including vitamins, herbs, eye drops, creams, and over-the-counter medicines.  Previous problems you or members of your family have had with the use of anesthetics.   Any blood disorders you have.   Previous surgeries you have had.   Medical conditions you have. RISKS AND COMPLICATIONS  Generally, this is a safe procedure. However, problems can occur and include:   Breathing problems related to the anesthetic used.  A blood clot that breaks free and travels to other parts of your body. This could cause a stroke or other problems. The risk of this is lowered by use of blood-thinning medicine (anticoagulant) prior to the procedure.  Cardiac arrest (rare). BEFORE THE PROCEDURE   You may have tests to detect blood clots in your heart and to evaluate heart function.  You may start taking anticoagulants so your blood does not clot as easily.   Medicines may be given to help stabilize your heart rate and rhythm. PROCEDURE  You will be given medicine through an IV tube to reduce discomfort and make you sleepy (sedative).   An electrical shock will be delivered. AFTER THE PROCEDURE Your heart rhythm  will be watched to make sure it does not change.    This information is not intended to replace advice given to you by your health care provider. Make sure you discuss any questions you have with your health care provider.   Document Released: 02/22/2002 Document Revised: 03/25/2014 Document Reviewed: 09/16/2012 Elsevier Interactive Patient Education 2016 [employer].    Any Other Special Instructions Will Be Listed Below (If Applicable).     If you need a refill on your cardiac medications before your next appointment, please call your pharmacy.

## 2015-04-29 LAB — CBC
Hematocrit: 48.7 % (ref 37.5–51.0)
Hemoglobin: 16.9 g/dL (ref 12.6–17.7)
MCH: 31.6 pg (ref 26.6–33.0)
MCHC: 34.7 g/dL (ref 31.5–35.7)
MCV: 91 fL (ref 79–97)
PLATELETS: 210 10*3/uL (ref 150–379)
RBC: 5.34 x10E6/uL (ref 4.14–5.80)
RDW: 13.8 % (ref 12.3–15.4)
WBC: 6.4 10*3/uL (ref 3.4–10.8)

## 2015-04-29 LAB — BASIC METABOLIC PANEL
BUN/Creatinine Ratio: 15 (ref 10–22)
BUN: 15 mg/dL (ref 8–27)
CALCIUM: 9.5 mg/dL (ref 8.6–10.2)
CO2: 23 mmol/L (ref 18–29)
CREATININE: 1 mg/dL (ref 0.76–1.27)
Chloride: 103 mmol/L (ref 96–106)
GFR calc Af Amer: 88 mL/min/{1.73_m2} (ref >59)
GFR, EST NON AFRICAN AMERICAN: 76 mL/min/{1.73_m2} (ref >59)
Glucose: 99 mg/dL (ref 65–99)
POTASSIUM: 4.3 mmol/L (ref 3.5–5.2)
SODIUM: 142 mmol/L (ref 134–144)

## 2015-04-29 LAB — PROTIME-INR
INR: 1.1 (ref 0.8–1.2)
PROTHROMBIN TIME: 11.5 s (ref 9.1–12.0)

## 2015-05-01 DIAGNOSIS — G4733 Obstructive sleep apnea (adult) (pediatric): Secondary | ICD-10-CM | POA: Diagnosis not present

## 2015-05-03 ENCOUNTER — Telehealth: Payer: Self-pay

## 2015-05-03 DIAGNOSIS — G4733 Obstructive sleep apnea (adult) (pediatric): Secondary | ICD-10-CM | POA: Diagnosis not present

## 2015-05-03 NOTE — Telephone Encounter (Signed)
Sleep Study

## 2015-05-04 ENCOUNTER — Ambulatory Visit (INDEPENDENT_AMBULATORY_CARE_PROVIDER_SITE_OTHER): Payer: PPO

## 2015-05-04 ENCOUNTER — Other Ambulatory Visit: Payer: Self-pay

## 2015-05-04 VITALS — BP 120/78 | HR 104 | Resp 16

## 2015-05-04 DIAGNOSIS — I4891 Unspecified atrial fibrillation: Secondary | ICD-10-CM

## 2015-05-04 NOTE — Patient Instructions (Signed)
1.) Reason for visit: EKG for DCCV Feb 17  2.) Name of MD requesting visit: Christell Faith, PA-C  3.) H&P: Pt had Feb 10 OV w/Ryan for newly diagnoses afib RVR. Pt taking metoprolol, eliquis and cardizem  4.) ROS related to problem: EKG confirms afib. Pt scheduled to have DCCV tomorrow with Dr. Rockey Situ at [facility]. Pt understands instructions including arrival time, medication, NPO.  5.) Assessment and plan per MD: EKG given to Dr. Fletcher Anon to review.

## 2015-05-05 ENCOUNTER — Encounter: Payer: Self-pay | Admitting: *Deleted

## 2015-05-05 ENCOUNTER — Ambulatory Visit: Payer: PPO | Admitting: Anesthesiology

## 2015-05-05 ENCOUNTER — Ambulatory Visit
Admission: RE | Admit: 2015-05-05 | Discharge: 2015-05-05 | Disposition: A | Discharge status: Home / Self Care | Payer: PPO | Source: Ambulatory Visit | Attending: Cardiovascular Disease | Admitting: Cardiovascular Disease

## 2015-05-05 ENCOUNTER — Encounter
Admission: RE | Disposition: A | Discharge status: Home / Self Care | Payer: Self-pay | Source: Ambulatory Visit | Attending: Cardiovascular Disease

## 2015-05-05 DIAGNOSIS — I10 Essential (primary) hypertension: Secondary | ICD-10-CM | POA: Insufficient documentation

## 2015-05-05 DIAGNOSIS — Z85828 Personal history of other malignant neoplasm of skin: Secondary | ICD-10-CM | POA: Diagnosis not present

## 2015-05-05 DIAGNOSIS — I481 Persistent atrial fibrillation: Secondary | ICD-10-CM | POA: Diagnosis not present

## 2015-05-05 DIAGNOSIS — Z6830 Body mass index (BMI) 30.0-30.9, adult: Secondary | ICD-10-CM | POA: Diagnosis not present

## 2015-05-05 DIAGNOSIS — E876 Hypokalemia: Secondary | ICD-10-CM | POA: Insufficient documentation

## 2015-05-05 DIAGNOSIS — Z87891 Personal history of nicotine dependence: Secondary | ICD-10-CM | POA: Insufficient documentation

## 2015-05-05 DIAGNOSIS — E039 Hypothyroidism, unspecified: Secondary | ICD-10-CM | POA: Insufficient documentation

## 2015-05-05 DIAGNOSIS — Z8249 Family history of ischemic heart disease and other diseases of the circulatory system: Secondary | ICD-10-CM | POA: Diagnosis not present

## 2015-05-05 DIAGNOSIS — Z87442 Personal history of urinary calculi: Secondary | ICD-10-CM | POA: Diagnosis not present

## 2015-05-05 DIAGNOSIS — I4891 Unspecified atrial fibrillation: Secondary | ICD-10-CM | POA: Diagnosis not present

## 2015-05-05 DIAGNOSIS — E669 Obesity, unspecified: Secondary | ICD-10-CM | POA: Insufficient documentation

## 2015-05-05 DIAGNOSIS — Z79899 Other long term (current) drug therapy: Secondary | ICD-10-CM | POA: Insufficient documentation

## 2015-05-05 DIAGNOSIS — Z7901 Long term (current) use of anticoagulants: Secondary | ICD-10-CM | POA: Insufficient documentation

## 2015-05-05 DIAGNOSIS — Z8546 Personal history of malignant neoplasm of prostate: Secondary | ICD-10-CM | POA: Diagnosis not present

## 2015-05-05 DIAGNOSIS — I4819 Other persistent atrial fibrillation: Secondary | ICD-10-CM | POA: Insufficient documentation

## 2015-05-05 HISTORY — PX: ELECTROPHYSIOLOGIC STUDY: SHX172A

## 2015-05-05 SURGERY — CARDIOVERSION (CATH LAB)
Anesthesia: General

## 2015-05-05 MED ORDER — PROPOFOL 10 MG/ML IV BOLUS
INTRAVENOUS | Status: DC | PRN
  Administered 2015-05-05 (×2): 60 mg via INTRAVENOUS

## 2015-05-05 MED ORDER — SODIUM CHLORIDE 0.9 % IV SOLN
INTRAVENOUS | Status: DC
  Administered 2015-05-05: 07:00:00 via INTRAVENOUS

## 2015-05-05 NOTE — H&P (View-Only) (Signed)
Cardiology Office Note Date:  04/28/2015  Patient ID:  [name] [name], [name] [name] 1945/05/06, MRN [id number] PCP:  Golden Pop, MD  Cardiologist:  Dr. Rockey Situ, MD    Chief Complaint: Hospital follow up for new onset Afib with RVR  History of Present Illness: [name] [name] is a 70 y.o. male with history of recently diagnosed Afib with RVR with heart rates in the 180's in the setting of hypokalemia and colonoscopy prep, HTN, hypothyroidism, likely undiagnosed sleep apnea, and prostate cancer who presents for hospital follow up for the above Afib.   Prior to the above admission he had no previously known cardiac history. He was undergoing a bowel prep for a routine screening colonoscopy which he was at [facility] as an outpatient for on 1/31. He was in his usual state of health and feeling fine. Upon his arrival to [facility] and endoscopy he was noted to be in new onset Afib with RVR with HR in the 180's by the anesthesiologist. He was completely asymptomatic. Troponin was negative x 4. TSH 2.07,  free T4 was elevated at 1.43, K+ 3.6, Mg++ 1.7. He received IV diltiazem with improvement of HR into the 1-teens, with subsequent improvement in rate into the 80s on metoprolol. Echo showed EF 50-55%, no RWMA, not sufficient to allow for LV diastolic function, mildly dilated left atrium at 46 mm, mild MR, normal RV systolic function, and normal PASP. He was started on Eliquis given his CHADS2VASc of 2 (HTN and age x 1). He remained rate controlled and was discharged.   He saw his PCP on 2/9 and was doing well. He was referred out for a sleep study given his history of likely sleep apnea.   Today he continues to do well. He is asymptomatic. He is advancing his activity as tolerated. His weight is stable. He is tolerating his medications without issues and has not missed any doses of his Eliquis. His heart rate at home ranges from a low of 64 to a peak of 104 by electronic monitor.    Past Medical History  Diagnosis  Date  . Dupuytren's contracture   . Hypertension   . Nephrolithiasis   . Persistent atrial fibrillation ([place]) 03/2015    a. CHADS2VASc = 2 (HTN, age); b. on Eliquis  . Hypothyroidism   . Prostate cancer ([place])   . Mitral regurgitation     a. echo 03/2015: echo 03/2015: EF 50-55%, no RWMA, no sufficienct to allow for LV diastolic fxn, mild MR, LA mildly dilated at 46 mm, RV systolic fxn nl, PASP nl  . Cancer [facility])     scalp    Past Surgical History  Procedure Laterality Date  . Radioactive seed implant N/A   . Colonoscopy with propofol N/A 04/18/2015    Procedure: COLONOSCOPY WITH PROPOFOL;  Surgeon: [name] Lame, MD;  Location: [facility] ENDOSCOPY;  Service: Endoscopy;  Laterality: N/A;    Current Outpatient Prescriptions  Medication Sig Dispense Refill  . apixaban (ELIQUIS) 5 MG TABS tablet Take 1 tablet (5 mg total) by mouth 2 (two) times daily. 60 tablet 0  . diltiazem (CARDIZEM SR) 90 MG 12 hr capsule Take 1 capsule (90 mg total) by mouth every 12 (twelve) hours. 60 capsule 0  . levothyroxine (SYNTHROID, LEVOTHROID) 50 MCG tablet Take 1 tablet (50 mcg total) by mouth daily before breakfast. 30 tablet 0  . Magnesium Citrate 100 MG TABS Take 1 capsule by mouth daily.    . metoprolol (LOPRESSOR) 50 MG tablet Take 1  tablet (50 mg total) by mouth 2 (two) times daily. 60 tablet 0  . Multiple Vitamin (MULTIVITAMIN) tablet Take 1 tablet by mouth daily.    . Selenium 200 MCG CAPS Take 1 capsule by mouth daily.     No current facility-administered medications for this visit.    Allergies:   Review of patient's allergies indicates no known allergies.   Social History:  The patient  reports that he quit smoking about 20 years ago. His smoking use included Cigarettes. He quit smokeless tobacco use about 10 years ago. His smokeless tobacco use included Chew. He reports that he does not drink alcohol or use illicit drugs.   Family History:  The patient's family history includes Heart attack in his  mother; Heart disease in his father.  ROS:   Review of Systems  Constitutional: Negative for fever, chills, weight loss, malaise/fatigue and diaphoresis.  HENT: Negative for congestion.   Eyes: Negative for discharge and redness.  Respiratory: Negative for cough, hemoptysis, sputum production, shortness of breath and wheezing.   Cardiovascular: Negative for chest pain, palpitations, orthopnea, claudication, leg swelling and PND.  Gastrointestinal: Negative for heartburn, nausea, vomiting and abdominal pain.  Musculoskeletal: Negative for myalgias and falls.  Skin: Negative for rash.  Neurological: Negative for dizziness, sensory change, speech change, focal weakness, loss of consciousness and weakness.  Endo/Heme/Allergies: Does not bruise/bleed easily.  Psychiatric/Behavioral: Negative for substance abuse. The patient is not nervous/anxious.   All other systems reviewed and are negative.   PHYSICAL EXAM:  VS:  BP 104/80 mmHg  Pulse 92  Ht 6' (1.829 m)  Wt 236 lb (107.049 kg)  BMI 32.00 kg/m2 BMI: Body mass index is 32 kg/(m^2). Well nourished, well developed, in no acute distress HEENT: normocephalic, atraumatic Neck: no JVD, carotid bruits or masses Cardiac: irregularly-irregular, S1, S2; RRR; no murmurs, rubs, or gallops Lungs:  clear to auscultation bilaterally, no wheezing, rhonchi or rales Abd: soft, nontender, no hepatomegaly, + BS MS: no deformity or atrophy Ext: trace pre-tibial edema bilaterally  Skin: warm and dry, no rash Neuro:  moves all extremities spontaneously, no focal abnormalities noted, follows commands Psych: euthymic mood, full affect   EKG:  Was ordered today. Shows Afib, 92 bpm, left axis, poor R wave progression, nonspecific inferior st/t changes   Recent Labs: 09/05/2014: ALT 24 04/18/2015: B Natriuretic Peptide 281.0*; BUN 14; Creatinine, Ser 1.07; Magnesium 1.7; Potassium 3.6; Sodium 141; TSH 2.074 04/19/2015: Hemoglobin 15.2; Platelets 164    09/05/2014: Chol/HDL Ratio 2.8; Cholesterol, Total 154; HDL 55; LDL Calculated 89; Triglycerides 50   Estimated Creatinine Clearance: 81.2 mL/min (by C-G formula based on Cr of 1.07).   Wt Readings from Last 3 Encounters:  04/28/15 236 lb (107.049 kg)  04/27/15 236 lb (107.049 kg)  04/18/15 229 lb (103.874 kg)     Other studies reviewed: Additional studies/records reviewed today include: summarized above  ASSESSMENT AND PLAN:  1. Persistent Afib: Rate controlled at this time. Heart rate at rest is in the 60's while with ambulation his heart rate peaks in the 90's. His blood pressure precludes further titration of rate controlling medications at this time. He is asymptomatic. Schedule DCCV with Dr. Rockey Situ, MD. He has not missed any doses of Eliquis. Once sinus rhythm has been restored he can likely come off Cardizem. The Cardizem also appears to be leading to some mild pre-tibial edema. Given he will be undergoing DCCV the first part of next week (2/13 or 2/14), 3 to 4 days from now  we will continue this medication at this time at his request for rate control. In the future perhaps it would be best to avoid CCB for rate control or hypertension given the side effect of lower extremity swelling. Risks and benefits of DCCV and chemical cardioversion have been explained to him in detail. He prefers DCCV at this time. His likely diagnosis of sleep apnea is most likely playing a role in this as well. Should he go back into Afib because of this or because of his obesity or his dilated left atrium of 46 mm would look to load with amiodarone prior to next DCCV. Once sinus rhythm is restored he will need a nuclear stress test. Continue Eliquis 5 mg bid given CHADS2VASc of 2 (HTN and age x 1).   2. Hypothyroidism: Per PCP.   3. Hypokalemia: Check bmet. Replete to 4.0.   4. Hypomagnesemia: Replete to 2.0.  5. Obesity/likely sleep apnea: He has been referred out for a sleep study by PCP. If needed we can  assist with this.   6. HTN: Well controlled today. BP is actually somewhat soft at this time which precludes further titration of his rate controlling medication as above, though at rest his heart is well controlled and with ambulation his heart rate only appears to peak in the 90's at this time.   Disposition: F/u with Dr. Rockey Situ, MD status post DCCV  Current medicines are reviewed at length with the patient today.  The patient did not have any concerns regarding medicines.  Melvern Banker PA-C 04/28/2015 10:13 AM     [facility] -  [address] [place], [place] [address] [phone]

## 2015-05-05 NOTE — Interval H&P Note (Signed)
History and Physical Interval Note:  05/05/2015 7:52 AM  [name] [name]  has presented today for surgery, with the diagnosis of Afib  The various methods of treatment have been discussed with the patient and family. After consideration of risks, benefits and other options for treatment, the patient has consented to  Procedure(s): CARDIOVERSION (N/A) as a surgical intervention .  The patient's history has been reviewed, patient examined, no change in status, stable for surgery.  I have reviewed the patient's chart and labs.  Questions were answered to the patient's satisfaction.     [name]

## 2015-05-05 NOTE — Anesthesia Postprocedure Evaluation (Signed)
Anesthesia Post Note  Patient: [name] [name]  Procedure(s) Performed: Procedure(s) (LRB): CARDIOVERSION (N/A)  Patient location during evaluation: PACU Anesthesia Type: General Pain management: pain level controlled Vital Signs Assessment: post-procedure vital signs reviewed and stable Respiratory status: spontaneous breathing Cardiovascular status: stable Anesthetic complications: no    Last Vitals:  Filed Vitals:   05/05/15 0753 05/05/15 0802  BP:  117/84  Pulse: 100 35  Temp:    Resp: 17 23    Last Pain: There were no vitals filed for this visit.               VAN STAVEREN,Niquita Digioia

## 2015-05-05 NOTE — Discharge Instructions (Signed)
Electrical Cardioversion, Care After °Refer to this sheet in the next few weeks. These instructions provide you with information on caring for yourself after your procedure. Your health care provider may also give you more specific instructions. Your treatment has been planned according to current medical practices, but problems sometimes occur. Call your health care provider if you have any problems or questions after your procedure. °WHAT TO EXPECT AFTER THE PROCEDURE °After your procedure, it is typical to have the following sensations: °· Some redness on the skin where the shocks were delivered. If this is tender, a sunburn lotion or hydrocortisone cream may help. °· Possible return of an abnormal heart rhythm within hours or days after the procedure. °HOME CARE INSTRUCTIONS °· Take medicines only as directed by your health care provider. Be sure you understand how and when to take your medicine. °· Learn how to feel your pulse and check it often. °· Limit your activity for 48 hours after the procedure or as directed by your health care provider. °· Avoid or minimize caffeine and other stimulants as directed by your health care provider. °SEEK MEDICAL CARE IF: °· You feel like your heart is beating too fast or your pulse is not regular. °· You have any questions about your medicines. °· You have bleeding that will not stop. °SEEK IMMEDIATE MEDICAL CARE IF: °· You are dizzy or feel faint. °· It is hard to breathe or you feel short of breath. °· There is a change in discomfort in your chest. °· Your speech is slurred or you have trouble moving an arm or leg on one side of your body. °· You get a serious muscle cramp that does not go away. °· Your fingers or toes turn cold or blue. °  °This information is not intended to replace advice given to you by your health care provider. Make sure you discuss any questions you have with your health care provider. °  °Document Released: 12/23/2012 Document Revised: 03/25/2014  Document Reviewed: 12/23/2012 °Elsevier Interactive Patient Education ©2016 [employer] ° °

## 2015-05-05 NOTE — Anesthesia Preprocedure Evaluation (Signed)
Anesthesia Evaluation  Patient identified by MRN, date of birth, ID band Patient awake    Reviewed: Allergy & Precautions, NPO status , Patient's Chart, lab work & pertinent test results  Airway Mallampati: III       Dental  (+) Teeth Intact   Pulmonary neg pulmonary ROS, former smoker,    breath sounds clear to auscultation       Cardiovascular hypertension, Pt. on medications and Pt. on home beta blockers + dysrhythmias Atrial Fibrillation  Rhythm:Irregular     Neuro/Psych negative neurological ROS     GI/Hepatic Neg liver ROS,   Endo/Other  Hypothyroidism   Renal/GU      Musculoskeletal   Abdominal Normal abdominal exam  (+)   Peds  Hematology   Anesthesia Other Findings   Reproductive/Obstetrics                             Anesthesia Physical Anesthesia Plan  ASA: II  Anesthesia Plan: General   Post-op Pain Management:    Induction: Intravenous  Airway Management Planned: Natural Airway and Nasal Cannula  Additional Equipment:   Intra-op Plan:   Post-operative Plan:   Informed Consent: I have reviewed the patients History and Physical, chart, labs and discussed the procedure including the risks, benefits and alternatives for the proposed anesthesia with the patient or authorized representative who has indicated his/her understanding and acceptance.     Plan Discussed with: CRNA  Anesthesia Plan Comments:         Anesthesia Quick Evaluation

## 2015-05-05 NOTE — CV Procedure (Signed)
Cardioversion note: A standard informed consent was obtained. Timeout was performed. The pads were placed in the anterior posterior fashion. The patient was given propofol by the anesthesia team.  Successful cardioversion was performed with a 200 J. The patient converted to sinus rhythm with frequent PACs. Pre-and post EKGs were reviewed. The patient tolerated the procedure with no immediate complications.  Recommendations: Stop Diltiazem. Continue other medications. Follow up with Dr. Rockey Situ next week.

## 2015-05-05 NOTE — Transfer of Care (Signed)
Immediate Anesthesia Transfer of Care Note  Patient: [name] [name]  Procedure(s) Performed: Procedure(s): CARDIOVERSION (N/A)  Patient Location: PACU and Cath Lab  Anesthesia Type:MAC  Level of Consciousness: awake, alert  and oriented  Airway & Oxygen Therapy: Patient Spontanous Breathing  Post-op Assessment: Report given to RN  Post vital signs: Reviewed and stable  Last Vitals:  Filed Vitals:   05/05/15 0752 05/05/15 0753  BP:    Pulse: 52 100  Temp:    Resp: 21 17    Complications: No apparent anesthesia complications

## 2015-05-11 ENCOUNTER — Ambulatory Visit (INDEPENDENT_AMBULATORY_CARE_PROVIDER_SITE_OTHER): Payer: PPO | Admitting: Cardiovascular Disease

## 2015-05-11 ENCOUNTER — Encounter: Payer: Self-pay | Admitting: Cardiovascular Disease

## 2015-05-11 VITALS — BP 140/78 | HR 60 | Ht 73.0 in | Wt 235.0 lb

## 2015-05-11 DIAGNOSIS — I10 Essential (primary) hypertension: Secondary | ICD-10-CM

## 2015-05-11 DIAGNOSIS — I4819 Other persistent atrial fibrillation: Secondary | ICD-10-CM

## 2015-05-11 DIAGNOSIS — I481 Persistent atrial fibrillation: Secondary | ICD-10-CM | POA: Diagnosis not present

## 2015-05-11 DIAGNOSIS — G4733 Obstructive sleep apnea (adult) (pediatric): Secondary | ICD-10-CM | POA: Insufficient documentation

## 2015-05-11 HISTORY — DX: Obstructive sleep apnea (adult) (pediatric): G47.33

## 2015-05-11 MED ORDER — METOPROLOL TARTRATE 50 MG PO TABS: 50.0000 mg | ORAL_TABLET | Freq: Two times a day (BID) | ORAL | Status: DC

## 2015-05-11 MED ORDER — APIXABAN 5 MG PO TABS: 5.0000 mg | ORAL_TABLET | Freq: Two times a day (BID) | ORAL | Status: DC

## 2015-05-11 MED ORDER — LEVOTHYROXINE SODIUM 50 MCG PO TABS: 50.0000 ug | ORAL_TABLET | Freq: Every day | ORAL | Status: DC

## 2015-05-11 NOTE — Assessment & Plan Note (Signed)
Maintaining normal sinus rhythm. We'll continue metoprolol and anticoagulation  He'll continue to monitor his rhythm at home using his blood pressure cuff and call our office if he has recurrent atrial fibrillation

## 2015-05-11 NOTE — Assessment & Plan Note (Signed)
He is scheduled to be fitted for CPAP  Next week

## 2015-05-11 NOTE — Patient Instructions (Signed)
You are doing well. No medication changes were made.  Please call us if you have new issues that need to be addressed before your next appt.  Your physician wants you to follow-up in: 6 months.  You will receive a reminder letter in the mail two months in advance. If you don't receive a letter, please call our office to schedule the follow-up appointment.   

## 2015-05-11 NOTE — Progress Notes (Signed)
Patient ID: [name] [name], male    DOB: 24-Apr-1945, 70 y.o.   MRN: [id number]  HPI Comments: [name] [name] is a 70 y.o. male with history of recently diagnosed Afib with RVR with heart rates in the 180's in the setting of hypokalemia and colonoscopy prep, HTN, hypothyroidism, sleep apnea, and prostate cancer who presents for  Follow-up after cardioversion last week for  Persistent atrial fibrillation   He was undergoing a bowel prep for a routine screening colonoscopy which he was at [facility] as an outpatient for on 04/18/15.  he was noted to be in new onset Afib with RVR with HR in the 180's  He was completely asymptomatic.  Troponin was negative x 4. TSH 2.07, free T4 was elevated at 1.43, K+ 3.6, Mg++ 1.7.  Echo showed EF 50-55%, no RWMA, not sufficient to allow for LV diastolic function, mildly dilated left atrium at 46 mm, mild MR, normal RV systolic function, and normal PASP.   He was started on Eliquis given his CHADS2VASc of 2 (HTN and age x 1).    he underwent cardioversion last week which was successful.  He does have a blood pressure cuff at home that can identify atrial fibrillation relatively successfully  Again he confirmed he was asymptomatic while in atrial fibrillation  currently denies any symptoms concerning for angina, no chest pain or shortness of breath on exertion, plays racquetball   Denies any diabetes, he does have a remote smoking history when he was in his 20s and 30s,  Total cholesterol 154, LDL 89   EKG on today's visit shows normal sinus rhythm with rate 60 bpm, no significant ST or T-wave changes    No Known Allergies  Current Outpatient Prescriptions on File Prior to Visit  Medication Sig Dispense Refill  . apixaban (ELIQUIS) 5 MG TABS tablet Take 1 tablet (5 mg total) by mouth 2 (two) times daily. 60 tablet 0  . levothyroxine (SYNTHROID, LEVOTHROID) 50 MCG tablet Take 1 tablet (50 mcg total) by mouth daily before breakfast. 30 tablet 0  . Magnesium  Citrate 100 MG TABS Take 1 capsule by mouth daily.    . metoprolol (LOPRESSOR) 50 MG tablet Take 1 tablet (50 mg total) by mouth 2 (two) times daily. 60 tablet 0  . Multiple Vitamin (MULTIVITAMIN) tablet Take 1 tablet by mouth daily.    . Selenium 200 MCG CAPS Take 1 capsule by mouth daily.     No current facility-administered medications on file prior to visit.    Past Medical History  Diagnosis Date  . Dupuytren's contracture   . Hypertension   . Nephrolithiasis   . Persistent atrial fibrillation ([place]) 03/2015    a. CHADS2VASc = 2 (HTN, age); b. on Eliquis  . Hypothyroidism   . Prostate cancer ([place])   . Mitral regurgitation     a. echo 03/2015: echo 03/2015: EF 50-55%, no RWMA, no sufficienct to allow for LV diastolic fxn, mild MR, LA mildly dilated at 46 mm, RV systolic fxn nl, PASP nl  . Cancer [facility])     scalp    Past Surgical History  Procedure Laterality Date  . Radioactive seed implant N/A   . Colonoscopy with propofol N/A 04/18/2015    Procedure: COLONOSCOPY WITH PROPOFOL;  Surgeon: Lucilla Lame, MD;  Location: [facility] ENDOSCOPY;  Service: Endoscopy;  Laterality: N/A;  . Electrophysiologic study N/A 05/05/2015    Procedure: CARDIOVERSION;  Surgeon: Wellington Hampshire, MD;  Location: [facility] ORS;  Service: Cardiovascular;  Laterality: N/A;    Social History  reports that he quit smoking about 20 years ago. His smoking use included Cigarettes. He quit smokeless tobacco use about 10 years ago. His smokeless tobacco use included Chew. He reports that he does not drink alcohol or use illicit drugs.  Family History family history includes Heart attack in his mother; Heart disease in his father.   Review of Systems  Constitutional: Negative.   Respiratory: Negative.   Cardiovascular: Negative.   Gastrointestinal: Negative.   Musculoskeletal: Negative.   Neurological: Negative.   Hematological: Negative.   Psychiatric/Behavioral: Negative.   All other systems reviewed and are  negative.   BP 140/78 mmHg  Pulse 60  Ht 6\' 1"  (1.854 m)  Wt 235 lb (106.595 kg)  BMI 31.01 kg/m2   Physical Exam  Constitutional: He is oriented to person, place, and time. He appears well-developed and well-nourished.  HENT:  Head: Normocephalic.  Nose: Nose normal.  Mouth/Throat: Oropharynx is clear and moist.  Eyes: Conjunctivae are normal. Pupils are equal, round, and reactive to light.  Neck: Normal range of motion. Neck supple. No JVD present.  Cardiovascular: Normal rate, regular rhythm, normal heart sounds and intact distal pulses.  Exam reveals no gallop and no friction rub.   No murmur heard. Pulmonary/Chest: Effort normal and breath sounds normal. No respiratory distress. He has no wheezes. He has no rales. He exhibits no tenderness.  Abdominal: Soft. Bowel sounds are normal. He exhibits no distension. There is no tenderness.  Musculoskeletal: Normal range of motion. He exhibits no edema or tenderness.  Lymphadenopathy:    He has no cervical adenopathy.  Neurological: He is alert and oriented to person, place, and time. Coordination normal.  Skin: Skin is warm and dry. No rash noted. No erythema.  Psychiatric: He has a normal mood and affect. His behavior is normal. Judgment and thought content normal.

## 2015-05-11 NOTE — Assessment & Plan Note (Signed)
Blood pressure is well controlled on today's visit. No changes made to the medications. 

## 2015-05-19 DIAGNOSIS — G4733 Obstructive sleep apnea (adult) (pediatric): Secondary | ICD-10-CM | POA: Diagnosis not present

## 2015-05-24 DIAGNOSIS — Z85828 Personal history of other malignant neoplasm of skin: Secondary | ICD-10-CM | POA: Diagnosis not present

## 2015-05-24 DIAGNOSIS — D0439 Carcinoma in situ of skin of other parts of face: Secondary | ICD-10-CM | POA: Diagnosis not present

## 2015-05-24 DIAGNOSIS — D0462 Carcinoma in situ of skin of left upper limb, including shoulder: Secondary | ICD-10-CM | POA: Diagnosis not present

## 2015-05-24 DIAGNOSIS — C44712 Basal cell carcinoma of skin of right lower limb, including hip: Secondary | ICD-10-CM | POA: Diagnosis not present

## 2015-05-24 DIAGNOSIS — L570 Actinic keratosis: Secondary | ICD-10-CM | POA: Diagnosis not present

## 2015-05-24 DIAGNOSIS — X32XXXA Exposure to sunlight, initial encounter: Secondary | ICD-10-CM | POA: Diagnosis not present

## 2015-05-24 DIAGNOSIS — Z08 Encounter for follow-up examination after completed treatment for malignant neoplasm: Secondary | ICD-10-CM | POA: Diagnosis not present

## 2015-05-24 DIAGNOSIS — D485 Neoplasm of uncertain behavior of skin: Secondary | ICD-10-CM | POA: Diagnosis not present

## 2015-06-19 DIAGNOSIS — G4733 Obstructive sleep apnea (adult) (pediatric): Secondary | ICD-10-CM | POA: Diagnosis not present

## 2015-07-19 DIAGNOSIS — G4733 Obstructive sleep apnea (adult) (pediatric): Secondary | ICD-10-CM | POA: Diagnosis not present

## 2015-07-25 DIAGNOSIS — L905 Scar conditions and fibrosis of skin: Secondary | ICD-10-CM | POA: Diagnosis not present

## 2015-07-25 DIAGNOSIS — C44712 Basal cell carcinoma of skin of right lower limb, including hip: Secondary | ICD-10-CM | POA: Diagnosis not present

## 2015-08-08 DIAGNOSIS — D0439 Carcinoma in situ of skin of other parts of face: Secondary | ICD-10-CM | POA: Diagnosis not present

## 2015-08-08 DIAGNOSIS — L905 Scar conditions and fibrosis of skin: Secondary | ICD-10-CM | POA: Diagnosis not present

## 2015-08-11 DIAGNOSIS — E669 Obesity, unspecified: Secondary | ICD-10-CM | POA: Diagnosis not present

## 2015-08-11 DIAGNOSIS — G4733 Obstructive sleep apnea (adult) (pediatric): Secondary | ICD-10-CM | POA: Diagnosis not present

## 2015-08-15 DIAGNOSIS — D0462 Carcinoma in situ of skin of left upper limb, including shoulder: Secondary | ICD-10-CM | POA: Diagnosis not present

## 2015-08-19 DIAGNOSIS — G4733 Obstructive sleep apnea (adult) (pediatric): Secondary | ICD-10-CM | POA: Diagnosis not present

## 2015-09-07 ENCOUNTER — Ambulatory Visit (INDEPENDENT_AMBULATORY_CARE_PROVIDER_SITE_OTHER): Payer: PPO | Admitting: Family Medicine

## 2015-09-07 ENCOUNTER — Encounter: Payer: Self-pay | Admitting: Family Medicine

## 2015-09-07 VITALS — BP 111/71 | HR 52 | Temp 98.0°F | Ht 72.8 in | Wt 232.0 lb

## 2015-09-07 DIAGNOSIS — R310 Gross hematuria: Secondary | ICD-10-CM | POA: Diagnosis not present

## 2015-09-07 DIAGNOSIS — E039 Hypothyroidism, unspecified: Secondary | ICD-10-CM | POA: Diagnosis not present

## 2015-09-07 DIAGNOSIS — Z0000 Encounter for general adult medical examination without abnormal findings: Secondary | ICD-10-CM

## 2015-09-07 DIAGNOSIS — I10 Essential (primary) hypertension: Secondary | ICD-10-CM | POA: Diagnosis not present

## 2015-09-07 LAB — URINALYSIS, ROUTINE W REFLEX MICROSCOPIC
BILIRUBIN UA: NEGATIVE
GLUCOSE, UA: NEGATIVE
KETONES UA: NEGATIVE
Leukocytes, UA: NEGATIVE
NITRITE UA: NEGATIVE
RBC, UA: NEGATIVE
SPEC GRAV UA: 1.015 (ref 1.005–1.030)
UUROB: 1 mg/dL (ref 0.2–1.0)
pH, UA: 7 (ref 5.0–7.5)

## 2015-09-07 LAB — MICROSCOPIC EXAMINATION

## 2015-09-07 NOTE — Assessment & Plan Note (Signed)
The current medical regimen is effective;  continue present plan and medications.  

## 2015-09-07 NOTE — Progress Notes (Signed)
BP 111/71 mmHg  Pulse 52  Temp(Src) 98 F (36.7 C)  Ht 6' 0.8" (1.849 m)  Wt 232 lb (105.235 kg)  BMI 30.78 kg/m2  SpO2 99%   Subjective:    Patient ID: [name] [name], male    DOB: 05-24-1945, 70 y.o.   MRN: [id number]  HPI: [name] [name] is a 70 y.o. male  Chief Complaint  Patient presents with  . Annual Exam  Patient presents for annual exam today.   Had a few hours one day last month of hematuria and passing several clots. Has not had any issues since. Did not go to see urologist about issue.  HTN - BPs doing well. Compliant with medications. No dizzy spells or syncope Atrial Fib - Followed by Dr. Donnetta Hail. No recent bleeding or bruising issues OSA - Working with sleep specialist to improve sleep quality.  Hypothyroid - Controlled with synthroid. No fatigue, dry skin, or other related symptoms noted. Dupuytrens - Stable condition, patient managing well. Good ROM and functionality Has had several skin cancers removed by dermatologist recently.   Relevant past medical, surgical, family and social history reviewed and updated as indicated. Interim medical history since our last visit reviewed. Allergies and medications reviewed and updated.  Review of Systems  Constitutional: Negative.   HENT: Negative.   Eyes: Negative.   Respiratory: Negative.   Cardiovascular: Negative.   Gastrointestinal: Negative.   Endocrine: Negative.   Genitourinary: Negative.   Musculoskeletal: Negative.   Skin: Negative.   Allergic/Immunologic: Negative.   Neurological: Negative.   Hematological: Negative.   Psychiatric/Behavioral: Negative.     Per HPI unless specifically indicated above     Objective:    BP 111/71 mmHg  Pulse 52  Temp(Src) 98 F (36.7 C)  Ht 6' 0.8" (1.849 m)  Wt 232 lb (105.235 kg)  BMI 30.78 kg/m2  SpO2 99%  Wt Readings from Last 3 Encounters:  09/07/15 232 lb (105.235 kg)  05/11/15 235 lb (106.595 kg)  05/05/15 230 lb (104.327 kg)    Physical  Exam  Constitutional: He is oriented to person, place, and time. He appears well-developed and well-nourished.  HENT:  Head: Normocephalic and atraumatic.  Right Ear: External ear normal.  Left Ear: External ear normal.  Right ear with moderate cerumen impaction Cerumen removed with water and instruments revealing normal canal and TM  Eyes: Conjunctivae and EOM are normal. Pupils are equal, round, and reactive to light.  Neck: Normal range of motion. Neck supple.  Cardiovascular: Normal rate, regular rhythm, normal heart sounds and intact distal pulses.   Pulmonary/Chest: Effort normal and breath sounds normal.  Abdominal: Soft. Bowel sounds are normal. There is no splenomegaly or hepatomegaly.  Genitourinary: Rectum normal and penis normal.  Absent prostate due to radiation  Musculoskeletal: Normal range of motion.  Neurological: He is alert and oriented to person, place, and time. He has normal reflexes.  Skin: No rash noted. No erythema.  Psychiatric: He has a normal mood and affect. His behavior is normal. Judgment and thought content normal.    Results for orders placed or performed in visit on 04/28/15  CBC  Result Value Ref Range   WBC 6.4 3.4 - 10.8 x10E3/uL   RBC 5.34 4.14 - 5.80 x10E6/uL   Hemoglobin 16.9 12.6 - 17.7 g/dL   Hematocrit 48.7 37.5 - 51.0 %   MCV 91 79 - 97 fL   MCH 31.6 26.6 - 33.0 pg   MCHC 34.7 31.5 - 35.7 g/dL  RDW 13.8 12.3 - 15.4 %   Platelets 210 150 - 379 [id number]  Basic Metabolic Panel (BMET)  Result Value Ref Range   Glucose 99 65 - 99 mg/dL   BUN 15 8 - 27 mg/dL   Creatinine, Ser 1.00 0.76 - 1.27 mg/dL   GFR calc non Af Amer 76 >59 mL/min/1.73   GFR calc Af Amer 88 >59 mL/min/1.73   BUN/Creatinine Ratio 15 10 - 22   Sodium 142 134 - 144 mmol/L   Potassium 4.3 3.5 - 5.2 mmol/L   Chloride 103 96 - 106 mmol/L   CO2 23 18 - 29 mmol/L   Calcium 9.5 8.6 - 10.2 mg/dL  INR/PT  Result Value Ref Range   INR 1.1 0.8 - 1.2   Prothrombin Time  11.5 9.1 - 12.0 sec      Assessment & Plan:   Problem List Items Addressed This Visit      Cardiovascular and Mediastinum   Essential hypertension (Chronic)    The current medical regimen is effective;  continue present plan and medications.         Endocrine   Hypothyroidism (Chronic)    The current medical regimen is effective;  continue present plan and medications.        Other Visit Diagnoses    Well adult exam    -  Primary    Relevant Orders    CBC with Differential/Platelet    Comprehensive metabolic panel    Lipid Panel w/o Chol/HDL Ratio    PSA    TSH    Urinalysis, Routine w reflex microscopic (not at [facility])    Gross hematuria        Painless hematuria for several hours about 6 weeks ago refer to urology to further evaluate patient on Eliquis.    Relevant Orders    Ambulatory referral to Urology        Follow up plan: Return in about 6 months (around 03/08/2016) for CBC, BMP.

## 2015-09-08 ENCOUNTER — Encounter: Payer: Self-pay | Admitting: Family Medicine

## 2015-09-08 LAB — CBC WITH DIFFERENTIAL/PLATELET
BASOS: 0 %
Basophils Absolute: 0 10*3/uL (ref 0.0–0.2)
EOS (ABSOLUTE): 0.2 10*3/uL (ref 0.0–0.4)
EOS: 3 %
HEMATOCRIT: 44.4 % (ref 37.5–51.0)
HEMOGLOBIN: 14.8 g/dL (ref 12.6–17.7)
Immature Grans (Abs): 0 10*3/uL (ref 0.0–0.1)
Immature Granulocytes: 0 %
LYMPHS ABS: 1.5 10*3/uL (ref 0.7–3.1)
Lymphs: 30 %
MCH: 31.6 pg (ref 26.6–33.0)
MCHC: 33.3 g/dL (ref 31.5–35.7)
MCV: 95 fL (ref 79–97)
MONOCYTES: 15 %
MONOS ABS: 0.7 10*3/uL (ref 0.1–0.9)
Neutrophils Absolute: 2.6 10*3/uL (ref 1.4–7.0)
Neutrophils: 52 %
Platelets: 200 10*3/uL (ref 150–379)
RBC: 4.69 x10E6/uL (ref 4.14–5.80)
RDW: 13.8 % (ref 12.3–15.4)
WBC: 5 10*3/uL (ref 3.4–10.8)

## 2015-09-08 LAB — LIPID PANEL W/O CHOL/HDL RATIO
Cholesterol, Total: 161 mg/dL (ref 100–199)
HDL: 57 mg/dL (ref >39)
LDL CALC: 94 mg/dL (ref 0–99)
Triglycerides: 51 mg/dL (ref 0–149)
VLDL Cholesterol Cal: 10 mg/dL (ref 5–40)

## 2015-09-08 LAB — COMPREHENSIVE METABOLIC PANEL
A/G RATIO: 2.1 (ref 1.2–2.2)
ALBUMIN: 4 g/dL (ref 3.5–4.8)
ALT: 26 IU/L (ref 0–44)
AST: 25 IU/L (ref 0–40)
Alkaline Phosphatase: 101 IU/L (ref 39–117)
BUN / CREAT RATIO: 18 (ref 10–24)
BUN: 18 mg/dL (ref 8–27)
Bilirubin Total: 0.9 mg/dL (ref 0.0–1.2)
CO2: 24 mmol/L (ref 18–29)
CREATININE: 1.02 mg/dL (ref 0.76–1.27)
Calcium: 8.9 mg/dL (ref 8.6–10.2)
Chloride: 104 mmol/L (ref 96–106)
GFR calc non Af Amer: 74 mL/min/{1.73_m2} (ref >59)
GFR, EST AFRICAN AMERICAN: 86 mL/min/{1.73_m2} (ref >59)
GLOBULIN, TOTAL: 1.9 g/dL (ref 1.5–4.5)
Glucose: 103 mg/dL — ABNORMAL HIGH (ref 65–99)
POTASSIUM: 4.2 mmol/L (ref 3.5–5.2)
SODIUM: 143 mmol/L (ref 134–144)
TOTAL PROTEIN: 5.9 g/dL — AB (ref 6.0–8.5)

## 2015-09-08 LAB — PSA: PROSTATE SPECIFIC AG, SERUM: 0.1 ng/mL (ref 0.0–4.0)

## 2015-09-08 LAB — TSH: TSH: 4.42 u[IU]/mL (ref 0.450–4.500)

## 2015-09-18 DIAGNOSIS — G4733 Obstructive sleep apnea (adult) (pediatric): Secondary | ICD-10-CM | POA: Diagnosis not present

## 2015-09-26 ENCOUNTER — Ambulatory Visit: Payer: PPO

## 2015-09-28 ENCOUNTER — Encounter: Payer: Self-pay | Admitting: Urology

## 2015-09-28 ENCOUNTER — Ambulatory Visit (INDEPENDENT_AMBULATORY_CARE_PROVIDER_SITE_OTHER): Payer: PPO | Admitting: Urology

## 2015-09-28 VITALS — BP 157/78 | HR 60 | Ht 73.0 in | Wt 238.3 lb

## 2015-09-28 DIAGNOSIS — Z8546 Personal history of malignant neoplasm of prostate: Secondary | ICD-10-CM | POA: Diagnosis not present

## 2015-09-28 DIAGNOSIS — R310 Gross hematuria: Secondary | ICD-10-CM | POA: Diagnosis not present

## 2015-09-28 LAB — URINALYSIS, COMPLETE
Bilirubin, UA: NEGATIVE
Glucose, UA: NEGATIVE
Ketones, UA: NEGATIVE
Leukocytes, UA: NEGATIVE
Nitrite, UA: NEGATIVE
Protein, UA: NEGATIVE
RBC, UA: NEGATIVE
Specific Gravity, UA: 1.015 (ref 1.005–1.030)
Urobilinogen, Ur: 0.2 mg/dL (ref 0.2–1.0)
pH, UA: 7.5 (ref 5.0–7.5)

## 2015-09-28 LAB — MICROSCOPIC EXAMINATION
BACTERIA UA: NONE SEEN
EPITHELIAL CELLS (NON RENAL): NONE SEEN /HPF (ref 0–10)

## 2015-09-28 NOTE — Progress Notes (Signed)
09/28/2015 8:50 AM   [name] [name] 10/14/1945 [id number]  Referring provider: Guadalupe Maple, MD [address] [place], [place] [address]  Chief Complaint  Patient presents with  . New Patient (Initial Visit)    gross hematuria    HPI: The patient a 70 y.o. Male Who presents for gross hematuria.  1. Gross hematuria The patient noted gross hematuria. He did pass small clots. This happened couple of times. His symptoms resolved. He is a former smoker. He does have a history of nephrolithiasis though does not report the pain that he typically feels passing a stone.   2. 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 PSA June 2017 was 0.1.   PMH: Past Medical History  Diagnosis Date  . Dupuytren's contracture   . Nephrolithiasis   . Hypothyroidism   . Mitral regurgitation     a. echo 03/2015: echo 03/2015: EF 50-55%, no RWMA, no sufficienct to allow for LV diastolic fxn, mild MR, LA mildly dilated at 46 mm, RV systolic fxn nl, PASP nl  . Persistent atrial fibrillation ([place])   . New onset atrial fibrillation ([place]) 04/18/2015  . Essential hypertension 09/05/2014  . Atrial fibrillation with RVR ([place])   . Malignant neoplasm of prostate ([name]) 09/05/2014    prostate,skin of the scalp  . OSA (obstructive sleep apnea) 05/11/2015  . Hypokalemia     Surgical History: Past Surgical History  Procedure Laterality Date  . Radioactive seed implant N/A   . Colonoscopy with propofol N/A 04/18/2015    Procedure: COLONOSCOPY WITH PROPOFOL;  Surgeon: Lucilla Lame, MD;  Location: [facility] ENDOSCOPY;  Service: Endoscopy;  Laterality: N/A;  . Electrophysiologic study N/A 05/05/2015    Procedure: CARDIOVERSION;  Surgeon: Wellington Hampshire, MD;  Location: [facility] ORS;  Service: Cardiovascular;  Laterality: N/A;    Home Medications:    Medication List       This list is accurate as of: 09/28/15  8:50 AM.  Always use your most recent med list.               apixaban 5 MG Tabs tablet  Commonly known as:  ELIQUIS  Take 1 tablet (5 mg total) by mouth 2 (two) times daily.     levothyroxine 50 MCG tablet  Commonly known as:  SYNTHROID, LEVOTHROID  Take 1 tablet (50 mcg total) by mouth daily before breakfast.     Magnesium Citrate 100 MG Tabs  Take 1 capsule by mouth daily.     metoprolol 50 MG tablet  Commonly known as:  LOPRESSOR  Take 1 tablet (50 mg total) by mouth 2 (two) times daily.     multivitamin tablet  Take 1 tablet by mouth daily.     Selenium 200 MCG Caps  Take 1 capsule by mouth daily.        Allergies: No Known Allergies  Family History: Family History  Problem Relation Age of Onset  . Heart attack Mother   . Heart disease Father     Social History:  reports that he quit smoking about 21 years ago. His smoking use included Cigarettes. He quit smokeless tobacco use about 11 years ago. His smokeless tobacco use included Chew. He reports that he does not drink alcohol or use illicit drugs.  ROS: UROLOGY Frequent Urination?: No Hard to postpone urination?: No Burning/pain with urination?: No Get up at night to urinate?: No Leakage of urine?: No Urine stream starts and stops?: No Trouble  starting stream?: No Do you have to strain to urinate?: No Blood in urine?: Yes Urinary tract infection?: No Sexually transmitted disease?: No Injury to kidneys or bladder?: No Painful intercourse?: No Weak stream?: No Erection problems?: No Penile pain?: No  Gastrointestinal Nausea?: No Vomiting?: No Indigestion/heartburn?: No Diarrhea?: No Constipation?: No  Constitutional Fever: No Night sweats?: No Weight loss?: No Fatigue?: No  Skin Skin rash/lesions?: No Itching?: No  Eyes Blurred vision?: No Double vision?: No  Ears/Nose/Throat Sore throat?: No Sinus problems?: No  Hematologic/Lymphatic Swollen glands?: No Easy bruising?: No  Cardiovascular Leg swelling?: No Chest pain?:  No  Respiratory Cough?: No Shortness of breath?: No  Endocrine Excessive thirst?: No  Musculoskeletal Back pain?: No Joint pain?: No  Neurological Headaches?: No Dizziness?: No  Psychologic Depression?: No Anxiety?: No  Physical Exam: BP 157/78 mmHg  Pulse 60  Ht 6\' 1"  (1.854 m)  Wt 238 lb 4.8 oz (108.092 kg)  BMI 31.45 kg/m2  Constitutional:  Alert and oriented, No acute distress. HEENT: [place] AT, moist mucus membranes.  Trachea midline, no masses. Cardiovascular: No clubbing, cyanosis, or edema. Respiratory: Normal respiratory effort, no increased work of breathing. GI: Abdomen is soft, nontender, nondistended, no abdominal masses GU: No CVA tenderness.  Skin: No rashes, bruises or suspicious lesions. Lymph: No cervical or inguinal adenopathy. Neurologic: Grossly intact, no focal deficits, moving all 4 extremities. Psychiatric: Normal mood and affect.  Laboratory Data: Lab Results  Component Value Date   WBC 5.0 09/07/2015   HGB 15.2 04/19/2015   HCT 44.4 09/07/2015   MCV 95 09/07/2015   PLT 200 09/07/2015    Lab Results  Component Value Date   CREATININE 1.02 09/07/2015    Lab Results  Component Value Date   PSA 0.1 01/10/2012   PSA 0.4 01/04/2011   PSA  07/05/2010    ========== TEST NAME ==========  ========= RESULTS =========  = REFERENCE RANGE =  PROSTATE-SPECIFIC AG.  PSA, Serum (Serial Monitor) Prostate Specific Ag, Serum     [   0.8 ng/mL            ]           0.0-4.0 Roche ECLIA methodology.                                                                      . According to the American Urological Association, Serum PSA should decrease and remain at undetectable levels after radical prostatectomy. The AUA defines biochemical recurrence as an initial PSA value 0.2 ng/mL or greater followed by a subsequent confirmatory PSA value 0.2 ng/mL or greater. Values obtained with different assay methods or kits cannot be used interchangeably.  Results cannot be interpreted as absolute evidence of the presence or absence of malignant disease.               [facility]            No: [id number]           [address] [place], [place] [address]           [name], MD         [phone]   Result(s) reported on 06 Jul 2010 at 12:50PM.  No results found for: TESTOSTERONE  No results found for: HGBA1C  Urinalysis    Component Value Date/Time   APPEARANCEUR Clear 09/07/2015 0849   GLUCOSEU Negative 09/07/2015 0849   BILIRUBINUR Negative 09/07/2015 0849   PROTEINUR 1+* 09/07/2015 0849   NITRITE Negative 09/07/2015 0849   LEUKOCYTESUR Negative 09/07/2015 0849     Assessment & Plan:    1. Gross hematuria -CT Urogram -follow up for office cystoscopy after above  2. History of prostate cancer -Stable. Due for repeat PSA in June 2018.  Return in about 2 weeks (around 10/12/2015) for after CT scan for cystoscopy.  Nickie Retort, MD  [facility] [address], [place], [place] [address] [phone]

## 2015-10-12 DIAGNOSIS — G4733 Obstructive sleep apnea (adult) (pediatric): Secondary | ICD-10-CM | POA: Diagnosis not present

## 2015-10-13 ENCOUNTER — Ambulatory Visit
Admission: RE | Admit: 2015-10-13 | Discharge: 2015-10-13 | Disposition: A | Discharge status: Home / Self Care | Payer: PPO | Source: Ambulatory Visit | Attending: Urology | Admitting: Urology

## 2015-10-13 DIAGNOSIS — M899 Disorder of bone, unspecified: Secondary | ICD-10-CM | POA: Insufficient documentation

## 2015-10-13 DIAGNOSIS — K769 Liver disease, unspecified: Secondary | ICD-10-CM | POA: Diagnosis not present

## 2015-10-13 DIAGNOSIS — K7689 Other specified diseases of liver: Secondary | ICD-10-CM | POA: Diagnosis not present

## 2015-10-13 DIAGNOSIS — R310 Gross hematuria: Secondary | ICD-10-CM | POA: Diagnosis not present

## 2015-10-13 DIAGNOSIS — G4733 Obstructive sleep apnea (adult) (pediatric): Secondary | ICD-10-CM | POA: Diagnosis not present

## 2015-10-13 MED ORDER — IOPAMIDOL (ISOVUE-300) INJECTION 61%
125.0000 mL | Freq: Once | INTRAVENOUS | Status: AC | PRN
  Administered 2015-10-13: 125 mL via INTRAVENOUS

## 2015-10-19 ENCOUNTER — Encounter: Payer: Self-pay | Admitting: Urology

## 2015-10-19 ENCOUNTER — Ambulatory Visit (INDEPENDENT_AMBULATORY_CARE_PROVIDER_SITE_OTHER): Payer: PPO | Admitting: Urology

## 2015-10-19 VITALS — BP 150/102 | Ht 73.0 in | Wt 236.6 lb

## 2015-10-19 DIAGNOSIS — R310 Gross hematuria: Secondary | ICD-10-CM

## 2015-10-19 DIAGNOSIS — Z8546 Personal history of malignant neoplasm of prostate: Secondary | ICD-10-CM | POA: Diagnosis not present

## 2015-10-19 DIAGNOSIS — G4733 Obstructive sleep apnea (adult) (pediatric): Secondary | ICD-10-CM | POA: Diagnosis not present

## 2015-10-19 LAB — URINALYSIS, COMPLETE
BILIRUBIN UA: NEGATIVE
GLUCOSE, UA: NEGATIVE
KETONES UA: NEGATIVE
Leukocytes, UA: NEGATIVE
Nitrite, UA: NEGATIVE
PROTEIN UA: NEGATIVE
RBC UA: NEGATIVE
Specific Gravity, UA: 1.02 (ref 1.005–1.030)
UUROB: 0.2 mg/dL (ref 0.2–1.0)
pH, UA: 6.5 (ref 5.0–7.5)

## 2015-10-19 LAB — MICROSCOPIC EXAMINATION
Bacteria, UA: NONE SEEN
Epithelial Cells (non renal): NONE SEEN /hpf (ref 0–10)

## 2015-10-19 MED ORDER — CIPROFLOXACIN HCL 500 MG PO TABS
500.0000 mg | ORAL_TABLET | Freq: Once | ORAL | Status: AC
  Administered 2015-10-19: 500 mg via ORAL

## 2015-10-19 MED ORDER — LIDOCAINE HCL 2 % EX GEL
1.0000 "application " | Freq: Once | CUTANEOUS | Status: AC
  Administered 2015-10-19: 1 via URETHRAL

## 2015-10-19 NOTE — Progress Notes (Signed)
10/19/2015 11:27 AM   [name] [name] [birth year] [id number]  Referring provider: Guadalupe Maple, MD [address] [place], [place] [address]  Chief Complaint  Patient presents with  . Cysto    hematuria    HPI: The patient a 70 y.o. Male Who presents for gross hematuria.  1. Gross hematuria The patient noted gross hematuria. He did pass small clots. This happened couple of times. His symptoms resolved. He is a former smoker. He does have a history of nephrolithiasis though does not report the pain that he typically feels passing a stone. CT Urogram unremarkable.   2. 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 PSA June 2017 was 0.1.     PMH: Past Medical History:  Diagnosis Date  . Atrial fibrillation with RVR ([place])   . Dupuytren's contracture   . Essential hypertension 09/05/2014  . Hypertension   . Hypokalemia   . Hypothyroidism   . Malignant neoplasm of prostate ([place]) 09/05/2014   prostate,skin of the scalp  . Mitral regurgitation    a. echo 03/2015: echo 03/2015: EF 50-55%, no RWMA, no sufficienct to allow for LV diastolic fxn, mild MR, LA mildly dilated at 46 mm, RV systolic fxn nl, PASP nl  . Nephrolithiasis   . New onset atrial fibrillation ([place]) 04/18/2015  . OSA (obstructive sleep apnea) 05/11/2015  . Persistent atrial fibrillation [name] [name])     Surgical History: Past Surgical History:  Procedure Laterality Date  . COLONOSCOPY WITH PROPOFOL N/A 04/18/2015   Procedure: COLONOSCOPY WITH PROPOFOL;  Surgeon: [name] Lame, MD;  Location: [facility] ENDOSCOPY;  Service: Endoscopy;  Laterality: N/A;  . ELECTROPHYSIOLOGIC STUDY N/A 05/05/2015   Procedure: CARDIOVERSION;  Surgeon: [name] Hampshire, MD;  Location: [facility] ORS;  Service: Cardiovascular;  Laterality: N/A;  . RADIOACTIVE SEED IMPLANT N/A     Home Medications:    Medication List       Accurate as of 10/19/15 11:27 AM. Always use your most recent med list.          apixaban  5 MG Tabs tablet Commonly known as:  ELIQUIS Take 1 tablet (5 mg total) by mouth 2 (two) times daily.   levothyroxine 50 MCG tablet Commonly known as:  SYNTHROID, LEVOTHROID Take 1 tablet (50 mcg total) by mouth daily before breakfast.   Magnesium Citrate 100 MG Tabs Take 1 capsule by mouth daily.   metoprolol 50 MG tablet Commonly known as:  LOPRESSOR Take 1 tablet (50 mg total) by mouth 2 (two) times daily.   multivitamin tablet Take 1 tablet by mouth daily.   Selenium 200 MCG Caps Take 1 capsule by mouth daily.       Allergies: No Known Allergies  Family History: Family History  Problem Relation Age of Onset  . Heart attack Mother   . Heart disease Father     Social History:  reports that he quit smoking about 21 years ago. His smoking use included Cigarettes. He quit smokeless tobacco use about 11 years ago. His smokeless tobacco use included Chew. He reports that he does not drink alcohol or use drugs.  ROS:                                        Physical Exam: There were no vitals taken for this visit.  Constitutional:  Alert and oriented, No acute distress. HEENT: [place]  AT, moist mucus membranes.  Trachea midline, no masses. Cardiovascular: No clubbing, cyanosis, or edema. Respiratory: Normal respiratory effort, no increased work of breathing. GI: Abdomen is soft, nontender, nondistended, no abdominal masses GU: No CVA tenderness.  Skin: No rashes, bruises or suspicious lesions. Lymph: No cervical or inguinal adenopathy. Neurologic: Grossly intact, no focal deficits, moving all 4 extremities. Psychiatric: Normal mood and affect.  Laboratory Data: Lab Results  Component Value Date   WBC 5.0 09/07/2015   HGB 15.2 04/19/2015   HCT 44.4 09/07/2015   MCV 95 09/07/2015   PLT 200 09/07/2015    Lab Results  Component Value Date   CREATININE 1.02 09/07/2015    Lab Results  Component Value Date   PSA 0.1 01/10/2012   PSA 0.4  01/04/2011   PSA  07/05/2010    ========== TEST NAME ==========  ========= RESULTS =========  = REFERENCE RANGE =  PROSTATE-SPECIFIC AG.  PSA, Serum (Serial Monitor) Prostate Specific Ag, Serum     [   0.8 ng/mL            ]           0.0-4.0 Roche ECLIA methodology.                                                                      . According to the American Urological Association, Serum PSA should decrease and remain at undetectable levels after radical prostatectomy. The AUA defines biochemical recurrence as an initial PSA value 0.2 ng/mL or greater followed by a subsequent confirmatory PSA value 0.2 ng/mL or greater. Values obtained with different assay methods or kits cannot be used interchangeably. Results cannot be interpreted as absolute evidence of the presence or absence of malignant disease.               [facility]            No: [id number]           [address] [place], [place] [address]           [name] Romp, MD         [phone]   Result(s) reported on 06 Jul 2010 at 12:50PM.     No results found for: TESTOSTERONE  No results found for: HGBA1C  Urinalysis    Component Value Date/Time   APPEARANCEUR Clear 09/28/2015 0830   GLUCOSEU Negative 09/28/2015 0830   BILIRUBINUR Negative 09/28/2015 0830   PROTEINUR Negative 09/28/2015 0830   NITRITE Negative 09/28/2015 0830   LEUKOCYTESUR Negative 09/28/2015 0830    Pertinent Imaging: CLINICAL DATA:  History of prostate cancer. Kidney stones. Gross hematuria about a month ago. EXAM: CT ABDOMEN AND PELVIS WITHOUT AND WITH CONTRAST TECHNIQUE: Multidetector CT imaging of the abdomen and pelvis was performed following the standard protocol before and following the bolus administration of intravenous contrast. CONTRAST:  137mL ISOVUE-300 IOPAMIDOL (ISOVUE-300) INJECTION 61% COMPARISON:  Radiation therapy planning CT from 03/02/2010. FINDINGS: Lower chest:  Unremarkable. Hepatobiliary:  Tiny 6-10 mm hypo attenuating lesions scattered in the liver cannot be further characterized there are likely cysts. There is no evidence for gallstones, gallbladder wall thickening, or pericholecystic fluid. No intrahepatic or extrahepatic biliary dilation. Pancreas: No focal mass lesion. No dilatation of the  main duct. No intraparenchymal cyst. No peripancreatic edema. Spleen: No splenomegaly. No focal mass lesion. Adrenals/Urinary Tract: No adrenal nodule or mass. No stones are seen in either kidney. No hydronephrosis or secondary change in either kidney. No evidence for hydroureter. No ureteral stones. The urinary bladder appears normal for the degree of distention. Stomach/Bowel: Stomach is nondistended. No gastric wall thickening. No evidence of outlet obstruction. Duodenum is normally positioned as is the ligament of Treitz. No small bowel wall thickening. No small bowel dilatation. The terminal ileum is normal. Calcified appendicolith noted without signs of appendicitis. No gross colonic mass. No colonic wall thickening. Scattered diverticuli seen in the left colon without diverticulitis. Vascular/Lymphatic: There is abdominal aortic atherosclerosis without aneurysm. Mild fusiform ectasia of the celiac axis noted. There is no gastrohepatic or hepatoduodenal ligament lymphadenopathy. No intraperitoneal or retroperitoneal lymphadenopathy. Reproductive: Brachytherapy seeds are seen in the prostate gland. Other: No intraperitoneal free fluid. Musculoskeletal: Scattered tiny sclerotic foci are seen in the anatomic pelvis and lumbar spine. The portions of the pelvis that were visualized on the prior CT scan showed the presence of those visualized sclerotic foci and there has been no interval change. IMPRESSION: 1. No evidence for urinary stones. No evidence to explain the patient's history of hematuria on this study without intravenous contrast material. 2. Scattered tiny hypo  attenuating liver lesions cannot be definitively characterize, but are likely cysts. 3. Tiny sclerotic foci within the bony anatomy. Those portions of the pelvis over visualize on the study from 6 years ago show no interval change in the sclerotic foci seen at that time. This suggest benign etiology such    Cystoscopy Procedure Note  Patient identification was confirmed, informed consent was obtained, and patient was prepped using Betadine solution.  Lidocaine jelly was administered per urethral meatus.    Preoperative abx where received prior to procedure.     Pre-Procedure: - Inspection reveals a normal caliber ureteral meatus.  Procedure: The flexible cystoscope was introduced without difficulty - No urethral strictures/lesions are present. - Enlarged prostate  - Hypervascular prostate - Normal bladder neck - Bilateral ureteral orifices identified - Bladder mucosa  reveals no ulcers, tumors, or lesions - No bladder stones - No trabeculation  Retroflexion shows small intravesical lobe   Post-Procedure: - Patient tolerated the procedure well    Assessment & Plan:    1. Gross hematuria -likely 2/2 hypervascular prostate. Hematuria work up otherwise negative. Offered patient finasteride to decrease vascularity. He is not interested at this time. If he develops gross hematuria again, will start finasteride -Follow up in one year for repeat urinalysis  2. History of prostate cancer -Stable. Due for repeat PSA in June 2018.     No Follow-up on file.  Nickie Retort, MD  [facility] [address], [place],  [address] [phone]

## 2015-11-09 ENCOUNTER — Ambulatory Visit (INDEPENDENT_AMBULATORY_CARE_PROVIDER_SITE_OTHER): Payer: PPO | Admitting: Cardiovascular Disease

## 2015-11-09 ENCOUNTER — Encounter: Payer: Self-pay | Admitting: Cardiovascular Disease

## 2015-11-09 VITALS — BP 120/74 | HR 52 | Ht 73.0 in | Wt 239.5 lb

## 2015-11-09 DIAGNOSIS — I10 Essential (primary) hypertension: Secondary | ICD-10-CM | POA: Diagnosis not present

## 2015-11-09 DIAGNOSIS — G4733 Obstructive sleep apnea (adult) (pediatric): Secondary | ICD-10-CM

## 2015-11-09 DIAGNOSIS — R319 Hematuria, unspecified: Secondary | ICD-10-CM | POA: Diagnosis not present

## 2015-11-09 DIAGNOSIS — I4891 Unspecified atrial fibrillation: Secondary | ICD-10-CM

## 2015-11-09 NOTE — Progress Notes (Signed)
Cardiology Office Note  Date:  11/09/2015   ID:  MAZIN FLORKOWSKI, DOB [birth year], MRN [id number]  PCP:  Golden Pop, MD   Chief Complaint  Patient presents with  . Other    6 month follow up. Meds reviewed by the patient verbally. "doing well."     HPI:  [name] [name] is a 70 y.o. male with history of recently diagnosed Afib with RVR with heart rates in the 180's in the setting of hypokalemia and colonoscopy prep, HTN, hypothyroidism, sleep apnea, and prostate cancer, With previous cardioversion for  Persistent atrial fibrillation He presents for routine follow-up of his atrial fibrillation He was asymptomatic when in atrial fibrillation He has history of sleep apnea, wears his CPAP  In follow-up today he reports that he feels well He did have episode of hematuria May 2017 CT scan was ordered Seen by urology Had a Cystoscopy. No significant pathology per the patient No more hematuria since then   CT scan ABD reviewed with him in detail, images pulled up in the office  no CAD, no aorta disease or iliac vessel   Heart rate is slow in the office today, he is asymptomatic, rate 52 HR 50 to 70 at home, denies symptoms No fatigue, shortness of breath on exertion No regular exercise program Long discussion concerning whether to stay on anticoagulation  EKG on today's visit shows normal sinus rhythm, bradycardia with rate 52 bpm, no significant ST or T-wave changes  Other past medical history reviewed  He was undergoing a bowel prep for a routine screening colonoscopy which he was at [facility] as an outpatient for on 04/18/15.  he was noted to be in new onset Afib with RVR with HR in the 180's  He was completely asymptomatic.  Troponin was negative x 4. TSH 2.07, free T4 was elevated at 1.43, K+ 3.6, Mg++ 1.7.  Echo showed EF 50-55%, no RWMA, not sufficient to allow for LV diastolic function, mildly dilated left atrium at 46 mm, mild MR, normal RV systolic function, and normal  PASP.   He was started on Eliquis given his CHADS2VASc of 2 (HTN and age x 1).    remote smoking history when he was in his 64s and 30s,  Total cholesterol 160, ldl 94  PMH:   has a past medical history of Atrial fibrillation with RVR ([place]); Dupuytren's contracture; Essential hypertension (09/05/2014); Hypertension; Hypokalemia; Hypothyroidism; Malignant neoplasm of prostate ([place]) (09/05/2014); Mitral regurgitation; Nephrolithiasis; New onset atrial fibrillation ([place]) (04/18/2015); OSA (obstructive sleep apnea) (05/11/2015); and Persistent atrial fibrillation ([place]).  PSH:    Past Surgical History:  Procedure Laterality Date  . COLONOSCOPY WITH PROPOFOL N/A 04/18/2015   Procedure: COLONOSCOPY WITH PROPOFOL;  Surgeon: Lucilla Lame, MD;  Location: [facility] ENDOSCOPY;  Service: Endoscopy;  Laterality: N/A;  . ELECTROPHYSIOLOGIC STUDY N/A 05/05/2015   Procedure: CARDIOVERSION;  Surgeon: [place] Hampshire, MD;  Location: [facility] ORS;  Service: Cardiovascular;  Laterality: N/A;  . RADIOACTIVE SEED IMPLANT N/A     Current Outpatient Prescriptions  Medication Sig Dispense Refill  . apixaban (ELIQUIS) 5 MG TABS tablet Take 1 tablet (5 mg total) by mouth 2 (two) times daily. 60 tablet 11  . levothyroxine (SYNTHROID, LEVOTHROID) 50 MCG tablet Take 1 tablet (50 mcg total) by mouth daily before breakfast. 30 tablet 6  . Magnesium Citrate 100 MG TABS Take 1 capsule by mouth daily.    . metoprolol (LOPRESSOR) 50 MG tablet Take 1 tablet (50 mg total) by mouth 2 (two) times  daily. 60 tablet 11  . Multiple Vitamin (MULTIVITAMIN) tablet Take 1 tablet by mouth daily.    . Selenium 200 MCG CAPS Take 1 capsule by mouth daily.     No current facility-administered medications for this visit.      Allergies:   Review of patient's allergies indicates no known allergies.   Social History:  The patient  reports that he quit smoking about 21 years ago. His smoking use included Cigarettes. He quit smokeless tobacco use about  11 years ago. His smokeless tobacco use included Chew. He reports that he does not drink alcohol or use drugs.   Family History:   family history includes Heart attack in his mother; Heart disease in his father.    Review of Systems: Review of Systems  Constitutional: Negative.   Respiratory: Negative.   Cardiovascular: Negative.   Gastrointestinal: Negative.   Musculoskeletal: Negative.   Neurological: Negative.   Psychiatric/Behavioral: Negative.   All other systems reviewed and are negative.    PHYSICAL EXAM: VS:  BP 120/74 (BP Location: Left Arm, Patient Position: Sitting, Cuff Size: Normal)   Pulse (!) 52   Ht 6\' 1"  (1.854 m)   Wt 239 lb 8 oz (108.6 kg)   BMI 31.60 kg/m  , BMI Body mass index is 31.6 kg/m. GEN: Well nourished, well developed, in no acute distress  HEENT: normal  Neck: no JVD, carotid bruits, or masses Cardiac: RRR; no murmurs, rubs, or gallops,no edema  Respiratory:  clear to auscultation bilaterally, normal work of breathing GI: soft, nontender, nondistended, + BS MS: no deformity or atrophy  Skin: warm and dry, no rash Neuro:  Strength and sensation are intact Psych: euthymic mood, full affect    Recent Labs: 04/18/2015: B Natriuretic Peptide 281.0; Magnesium 1.7 04/19/2015: Hemoglobin 15.2 09/07/2015: ALT 26; BUN 18; Creatinine, Ser 1.02; Platelets 200; Potassium 4.2; Sodium 143; TSH 4.420    Lipid Panel Lab Results  Component Value Date   CHOL 161 09/07/2015   HDL 57 09/07/2015   LDLCALC 94 09/07/2015   TRIG 51 09/07/2015      Wt Readings from Last 3 Encounters:  11/09/15 239 lb 8 oz (108.6 kg)  10/19/15 236 lb 9.6 oz (107.3 kg)  09/28/15 238 lb 4.8 oz (108.1 kg)       ASSESSMENT AND PLAN:  Atrial fibrillation with RVR ([place]) - Plan: EKG 12-Lead Denies having any palpitations concerning for atrial fibrillation He was asymptomatic in the past We'll stay on metoprolol 50  despite bradycardia. She is asymptomatic He will monitor  heart rate at home Long discussion concerning whether to stay on anticoagulation As he was asymptomatic, and his   CHADS VASC mildly elevated, he will stay on anticoagulation for now  Essential hypertension - Plan: EKG 12-Lead Blood pressure is well controlled on today's visit. No changes made to the medications.  Hematuria  seen by urology, no further episodes  Recommended he call our office if he has additional episodes may need to readdress the anticoagulation   OSA (obstructive sleep apnea)  tolerating his CPAP, episodes have dramatically decreased from 46 down to 6 per the patient    Total encounter time more than 25 minutes  Greater than 50% was spent in counseling and coordination of care with the patient  Disposition:   F/U  12 months   Orders Placed This Encounter  Procedures  . EKG 12-Lead     Signed, Esmond Plants, M.D., Ph.D. 11/09/2015  [place]  [phone]   

## 2015-11-09 NOTE — Patient Instructions (Signed)
Medication Instructions:   No medication changes  Labwork:  NO new labs  Testing/Procedures:  No new testing  Follow-Up: It was a pleasure seeing you in the office today. Please call us if you have new issues that need to be addressed before your next appt.  [phone]  Your physician wants you to follow-up in: 12 months.  You will receive a reminder letter in the mail two months in advance. If you don't receive a letter, please call our office to schedule the follow-up appointment.  If you need a refill on your cardiac medications before your next appointment, please call your pharmacy.

## 2015-11-16 DIAGNOSIS — D044 Carcinoma in situ of skin of scalp and neck: Secondary | ICD-10-CM | POA: Diagnosis not present

## 2015-11-16 DIAGNOSIS — L570 Actinic keratosis: Secondary | ICD-10-CM | POA: Diagnosis not present

## 2015-11-16 DIAGNOSIS — C4442 Squamous cell carcinoma of skin of scalp and neck: Secondary | ICD-10-CM | POA: Diagnosis not present

## 2015-11-16 DIAGNOSIS — D485 Neoplasm of uncertain behavior of skin: Secondary | ICD-10-CM | POA: Diagnosis not present

## 2015-11-16 DIAGNOSIS — Z08 Encounter for follow-up examination after completed treatment for malignant neoplasm: Secondary | ICD-10-CM | POA: Diagnosis not present

## 2015-11-16 DIAGNOSIS — Z85828 Personal history of other malignant neoplasm of skin: Secondary | ICD-10-CM | POA: Diagnosis not present

## 2015-11-16 DIAGNOSIS — X32XXXA Exposure to sunlight, initial encounter: Secondary | ICD-10-CM | POA: Diagnosis not present

## 2015-11-19 DIAGNOSIS — G4733 Obstructive sleep apnea (adult) (pediatric): Secondary | ICD-10-CM | POA: Diagnosis not present

## 2015-12-06 DIAGNOSIS — C4492 Squamous cell carcinoma of skin, unspecified: Secondary | ICD-10-CM | POA: Diagnosis not present

## 2015-12-06 DIAGNOSIS — L905 Scar conditions and fibrosis of skin: Secondary | ICD-10-CM | POA: Diagnosis not present

## 2015-12-12 ENCOUNTER — Other Ambulatory Visit: Payer: Self-pay | Admitting: Cardiovascular Disease

## 2015-12-13 DIAGNOSIS — D485 Neoplasm of uncertain behavior of skin: Secondary | ICD-10-CM | POA: Diagnosis not present

## 2015-12-13 DIAGNOSIS — D044 Carcinoma in situ of skin of scalp and neck: Secondary | ICD-10-CM | POA: Diagnosis not present

## 2015-12-13 DIAGNOSIS — L905 Scar conditions and fibrosis of skin: Secondary | ICD-10-CM | POA: Diagnosis not present

## 2015-12-14 ENCOUNTER — Telehealth: Payer: Self-pay | Admitting: Family Medicine

## 2015-12-14 MED ORDER — LEVOTHYROXINE SODIUM 50 MCG PO TABS: 50.0000 ug | ORAL_TABLET | Freq: Every day | ORAL | 6 refills | Status: DC

## 2015-12-14 NOTE — Telephone Encounter (Signed)
Sent to [facility]

## 2015-12-14 NOTE — Telephone Encounter (Signed)
Pt called stated he needs a refill on his Levothyroxine. Pt stated Dr. Rockey Situ is no longer willing to refill this medication. Pt has one pill left. Pharm is Paediatric nurse on [address]. Thanks.

## 2015-12-19 DIAGNOSIS — G4733 Obstructive sleep apnea (adult) (pediatric): Secondary | ICD-10-CM | POA: Diagnosis not present

## 2016-01-15 DIAGNOSIS — G4733 Obstructive sleep apnea (adult) (pediatric): Secondary | ICD-10-CM | POA: Diagnosis not present

## 2016-01-19 DIAGNOSIS — G4733 Obstructive sleep apnea (adult) (pediatric): Secondary | ICD-10-CM | POA: Diagnosis not present

## 2016-02-18 DIAGNOSIS — G4733 Obstructive sleep apnea (adult) (pediatric): Secondary | ICD-10-CM | POA: Diagnosis not present

## 2016-03-01 DIAGNOSIS — E669 Obesity, unspecified: Secondary | ICD-10-CM | POA: Diagnosis not present

## 2016-03-01 DIAGNOSIS — G4733 Obstructive sleep apnea (adult) (pediatric): Secondary | ICD-10-CM | POA: Diagnosis not present

## 2016-03-07 ENCOUNTER — Ambulatory Visit (INDEPENDENT_AMBULATORY_CARE_PROVIDER_SITE_OTHER): Payer: PPO | Admitting: Family Medicine

## 2016-03-07 ENCOUNTER — Encounter: Payer: Self-pay | Admitting: Family Medicine

## 2016-03-07 VITALS — BP 143/77 | HR 57 | Temp 98.0°F | Wt 243.0 lb

## 2016-03-07 DIAGNOSIS — M720 Palmar fascial fibromatosis [Dupuytren]: Secondary | ICD-10-CM

## 2016-03-07 DIAGNOSIS — I10 Essential (primary) hypertension: Secondary | ICD-10-CM | POA: Diagnosis not present

## 2016-03-07 DIAGNOSIS — Z23 Encounter for immunization: Secondary | ICD-10-CM | POA: Diagnosis not present

## 2016-03-07 DIAGNOSIS — E039 Hypothyroidism, unspecified: Secondary | ICD-10-CM | POA: Diagnosis not present

## 2016-03-07 DIAGNOSIS — I4891 Unspecified atrial fibrillation: Secondary | ICD-10-CM

## 2016-03-07 MED ORDER — LEVOTHYROXINE SODIUM 50 MCG PO TABS: 50.0000 ug | ORAL_TABLET | Freq: Every day | ORAL | 6 refills | Status: DC

## 2016-03-07 NOTE — Assessment & Plan Note (Signed)
The current medical regimen is effective;  continue present plan and medications.  

## 2016-03-07 NOTE — Progress Notes (Signed)
BP (!) 143/77 (BP Location: Left Arm, Patient Position: Sitting, Cuff Size: Large)   Pulse (!) 57   Temp 98 F (36.7 C)   Wt 243 lb (110.2 kg)   SpO2 98%   BMI 32.06 kg/m    Subjective:    Patient ID: [name] [name], male    DOB: 12/20/1945, 70 y.o.   MRN: [id number]  HPI: [name] [name] is a 70 y.o. male  Chief Complaint  Patient presents with  . Atrial Fibrillation  . Hypertension  Patient follow-up medical conditions problems doing well with no complaints. Eliquis no bleeding bruising issues. Heart no problems or issues able to walk to the woods without problems did have some leg cramps but started taking some extra potassium and that went away. Blood pressure heart doing well with metoprolol. Also thyroid doing well.  Relevant past medical, surgical, family and social history reviewed and updated as indicated. Interim medical history since our last visit reviewed. Allergies and medications reviewed and updated.  Review of Systems  Constitutional: Negative.   Respiratory: Negative.   Cardiovascular: Negative.     Per HPI unless specifically indicated above     Objective:    BP (!) 143/77 (BP Location: Left Arm, Patient Position: Sitting, Cuff Size: Large)   Pulse (!) 57   Temp 98 F (36.7 C)   Wt 243 lb (110.2 kg)   SpO2 98%   BMI 32.06 kg/m   Wt Readings from Last 3 Encounters:  03/07/16 243 lb (110.2 kg)  11/09/15 239 lb 8 oz (108.6 kg)  10/19/15 236 lb 9.6 oz (107.3 kg)    Physical Exam  Constitutional: He is oriented to person, place, and time. He appears well-developed and well-nourished. No distress.  HENT:  Head: Normocephalic and atraumatic.  Right Ear: Hearing normal.  Left Ear: Hearing normal.  Nose: Nose normal.  Eyes: Conjunctivae and lids are normal. Right eye exhibits no discharge. Left eye exhibits no discharge. No scleral icterus.  Cardiovascular: Normal heart sounds.   Pulmonary/Chest: Effort normal and breath sounds normal. No  respiratory distress.  Musculoskeletal: Normal range of motion.  Neurological: He is alert and oriented to person, place, and time.  Skin: Skin is intact. No rash noted.  Psychiatric: He has a normal mood and affect. His speech is normal and behavior is normal. Judgment and thought content normal. Cognition and memory are normal.    Results for orders placed or performed in visit on 10/19/15  Microscopic Examination  Result Value Ref Range   WBC, UA 0-5 0 - 5 /hpf   RBC, UA 0-2 0 - 2 /hpf   Epithelial Cells (non renal) None seen 0 - 10 /hpf   Bacteria, UA None seen None seen/Few  Urinalysis, Complete  Result Value Ref Range   Specific Gravity, UA 1.020 1.005 - 1.030   pH, UA 6.5 5.0 - 7.5   Color, UA Yellow Yellow   Appearance Ur Clear Clear   Leukocytes, UA Negative Negative   Protein, UA Negative Negative/Trace   Glucose, UA Negative Negative   Ketones, UA Negative Negative   RBC, UA Negative Negative   Bilirubin, UA Negative Negative   Urobilinogen, Ur 0.2 0.2 - 1.0 mg/dL   Nitrite, UA Negative Negative   Microscopic Examination See below:       Assessment & Plan:   Problem List Items Addressed This Visit      Cardiovascular and Mediastinum   Essential hypertension - Primary (Chronic)    The  current medical regimen is effective;  continue present plan and medications.       Relevant Orders   Basic metabolic panel   New onset atrial fibrillation (HCC)   Relevant Orders   CBC with Differential/Platelet   Atrial fibrillation with RVR (HCC)    The current medical regimen is effective;  continue present plan and medications.         Endocrine   Hypothyroidism (Chronic)    The current medical regimen is effective;  continue present plan and medications.       Relevant Medications   levothyroxine (SYNTHROID, LEVOTHROID) 50 MCG tablet     Musculoskeletal and Integument   Dupuytren's contracture    The current medical regimen is effective;  continue present  plan and medications.        Other Visit Diagnoses    Need for influenza vaccination       Relevant Orders   Flu vaccine HIGH DOSE PF (Completed)       Follow up plan: Return in about 6 months (around 09/05/2016) for Physical Exam.

## 2016-03-08 ENCOUNTER — Encounter: Payer: Self-pay | Admitting: Family Medicine

## 2016-03-08 LAB — CBC WITH DIFFERENTIAL/PLATELET
BASOS: 0 %
Basophils Absolute: 0 10*3/uL (ref 0.0–0.2)
EOS (ABSOLUTE): 0.2 10*3/uL (ref 0.0–0.4)
Eos: 4 %
Hematocrit: 45.4 % (ref 37.5–51.0)
Hemoglobin: 14.8 g/dL (ref 13.0–17.7)
Immature Grans (Abs): 0 10*3/uL (ref 0.0–0.1)
Immature Granulocytes: 0 %
LYMPHS ABS: 1.7 10*3/uL (ref 0.7–3.1)
Lymphs: 29 %
MCH: 30.5 pg (ref 26.6–33.0)
MCHC: 32.6 g/dL (ref 31.5–35.7)
MCV: 94 fL (ref 79–97)
MONOS ABS: 0.6 10*3/uL (ref 0.1–0.9)
Monocytes: 11 %
NEUTROS ABS: 3.2 10*3/uL (ref 1.4–7.0)
Neutrophils: 56 %
PLATELETS: 198 10*3/uL (ref 150–379)
RBC: 4.85 x10E6/uL (ref 4.14–5.80)
RDW: 13.6 % (ref 12.3–15.4)
WBC: 5.7 10*3/uL (ref 3.4–10.8)

## 2016-03-08 LAB — BASIC METABOLIC PANEL
BUN / CREAT RATIO: 12 (ref 10–24)
BUN: 13 mg/dL (ref 8–27)
CHLORIDE: 106 mmol/L (ref 96–106)
CO2: 25 mmol/L (ref 18–29)
Calcium: 9 mg/dL (ref 8.6–10.2)
Creatinine, Ser: 1.05 mg/dL (ref 0.76–1.27)
GFR calc non Af Amer: 71 mL/min/{1.73_m2} (ref >59)
GFR, EST AFRICAN AMERICAN: 82 mL/min/{1.73_m2} (ref >59)
Glucose: 103 mg/dL — ABNORMAL HIGH (ref 65–99)
POTASSIUM: 4.2 mmol/L (ref 3.5–5.2)
Sodium: 144 mmol/L (ref 134–144)

## 2016-03-19 DIAGNOSIS — Z85828 Personal history of other malignant neoplasm of skin: Secondary | ICD-10-CM | POA: Diagnosis not present

## 2016-03-19 DIAGNOSIS — L570 Actinic keratosis: Secondary | ICD-10-CM | POA: Diagnosis not present

## 2016-03-19 DIAGNOSIS — X32XXXA Exposure to sunlight, initial encounter: Secondary | ICD-10-CM | POA: Diagnosis not present

## 2016-03-19 DIAGNOSIS — L820 Inflamed seborrheic keratosis: Secondary | ICD-10-CM | POA: Diagnosis not present

## 2016-03-19 DIAGNOSIS — Z08 Encounter for follow-up examination after completed treatment for malignant neoplasm: Secondary | ICD-10-CM | POA: Diagnosis not present

## 2016-03-20 DIAGNOSIS — G4733 Obstructive sleep apnea (adult) (pediatric): Secondary | ICD-10-CM | POA: Diagnosis not present

## 2016-04-20 DIAGNOSIS — G4733 Obstructive sleep apnea (adult) (pediatric): Secondary | ICD-10-CM | POA: Diagnosis not present

## 2016-05-09 ENCOUNTER — Other Ambulatory Visit: Payer: Self-pay | Admitting: Cardiovascular Disease

## 2016-05-18 DIAGNOSIS — G4733 Obstructive sleep apnea (adult) (pediatric): Secondary | ICD-10-CM | POA: Diagnosis not present

## 2016-05-27 ENCOUNTER — Telehealth: Payer: Self-pay | Admitting: Cardiovascular Disease

## 2016-05-27 NOTE — Telephone Encounter (Signed)
Pt calling stating his BP machine is telling him he is having a irregular Heartbeat  Only has been going since yesterday The last good reading he had was 05/07/16 125/65 HR 70  He states he has had Afib in past And can tell there is something wrong. Would like advise on this  Please call   05/26/16 117/78 hr 110 05/27/16  11 am 128/87 HR 91

## 2016-05-27 NOTE — Telephone Encounter (Addendum)
Spoke w/ pt.  He reports that he took his BP on Sunday and his monitor showed irregular HR. He has been getting these readings since yesterday.  Pt denies SOB; his only complaint is extra sweating and flushing while he plays raquetball. He has had increased events of OSA on his CPAP machine since that time.   Pt would like to come in for EKG and eval, as his last ov was in 8/17; he states that he does not feel bad and is ok waiting until the end of week, if needed. Pt takes metoprolol 50 mg BID and Eliquis 5 mg BID. Pt s/p DCCV 1 year ago. Advised him that our office is closing early today due to inclement weather, but I will make Dr. Rockey Situ aware of his concerns and call him back w/ his recommendation.

## 2016-05-28 NOTE — Telephone Encounter (Signed)
Pt added on to schedule to see Dr. Rockey Situ Thursday, 3/15 @ 11:20.

## 2016-05-30 ENCOUNTER — Ambulatory Visit (INDEPENDENT_AMBULATORY_CARE_PROVIDER_SITE_OTHER): Payer: PPO | Admitting: Cardiovascular Disease

## 2016-05-30 ENCOUNTER — Encounter: Payer: Self-pay | Admitting: Cardiovascular Disease

## 2016-05-30 VITALS — BP 120/80 | HR 123 | Ht 73.0 in | Wt 245.5 lb

## 2016-05-30 DIAGNOSIS — G4733 Obstructive sleep apnea (adult) (pediatric): Secondary | ICD-10-CM | POA: Diagnosis not present

## 2016-05-30 DIAGNOSIS — I10 Essential (primary) hypertension: Secondary | ICD-10-CM

## 2016-05-30 DIAGNOSIS — I4819 Other persistent atrial fibrillation: Secondary | ICD-10-CM

## 2016-05-30 DIAGNOSIS — I481 Persistent atrial fibrillation: Secondary | ICD-10-CM

## 2016-05-30 MED ORDER — AMIODARONE HCL 200 MG PO TABS: 200.0000 mg | ORAL_TABLET | Freq: Two times a day (BID) | ORAL | 3 refills | Status: DC

## 2016-05-30 NOTE — Progress Notes (Signed)
Cardiology Office Note  Date:  05/30/2016   ID:  [name] [name], DOB [birth year], MRN [id number]  PCP:  Golden Pop, MD   Chief Complaint  Patient presents with  . other    Pt. c/o A-Fib spells, rapid irreg. heart beats, some LE edema and minor indigestion.  Meds reviewed by the pt. verbally.     HPI:  [name] [name] is a 71 y.o. male with history of recently diagnosed Afib with RVR with heart rates in the 180's in the setting of hypokalemia and colonoscopy prep, HTN, hypothyroidism, sleep apnea, and prostate cancer, With previous cardioversion for Persistent atrial fibrillation He presents for routine follow-up of his atrial fibrillation He was asymptomatic when in atrial fibrillation He has history of sleep apnea, wears his CPAP ECHO EF 50 to 55%, 04/18/2015  In follow-up today he reports that he might be in atrial fibrillation over the past several days 05/26/16, elevated HR at home on his measurements More flushed recently, does not feel as well when playing racquetball Wears CPAP, number of apnea events up to 15, previously 5 to 9 overnight Heart Rate this Am 123 bpm on EKG He has been compliant on his anticoagulation CHADS2VASc of 2 (HTN and age x 1).  Take eliquis 5 mg twice a day He has been taking metoprolol 50 mg twice a day  Prior to this had followed regular exercise program No chest pain or shortness of breath on exertion, now with new symptoms of palpitations, flushing  EKG on today's visit shows normal sinus rhythm, bradycardia with rate 123 bpm, no significant ST or T-wave changes  Other past medical history reviewed He did have episode of hematuria May 2017 Had a Cystoscopy. No significant pathology per the patient No more hematuria since then   Previous CT scan ABD showing no CAD, no aorta disease or iliac vessel   He was undergoing a bowel prep for a routine screening colonoscopy which he was at [facility] as an outpatient for on 04/18/15. he was noted to  be in new onset Afib with RVR with HR in the 180's  He was completely asymptomatic.  Troponin was negative x 4. TSH 2.07, free T4 was elevated at 1.43, K+ 3.6, Mg++ 1.7.  Echo showed EF 50-55%, no RWMA, not sufficient to allow for LV diastolic function, mildly dilated left atrium at 46 mm, mild MR, normal RV systolic function, and normal PASP.   He was started on Eliquis given his CHADS2VASc of 2 (HTN and age x 1).    remote smoking history when he was in his 52s and 30s, Total cholesterol 160, ldl 94   PMH:   has a past medical history of Atrial fibrillation with RVR ([place]); Dupuytren's contracture; Essential hypertension (09/05/2014); Hypertension; Hypokalemia; Hypothyroidism; Malignant neoplasm of prostate ([place]) (09/05/2014); Mitral regurgitation; Nephrolithiasis; New onset atrial fibrillation ([place]) (04/18/2015); OSA (obstructive sleep apnea) (05/11/2015); and Persistent atrial fibrillation ([place]).  PSH:    Past Surgical History:  Procedure Laterality Date  . COLONOSCOPY WITH PROPOFOL N/A 04/18/2015   Procedure: COLONOSCOPY WITH PROPOFOL;  Surgeon: Lucilla Lame, MD;  Location: [facility] ENDOSCOPY;  Service: Endoscopy;  Laterality: N/A;  . ELECTROPHYSIOLOGIC STUDY N/A 05/05/2015   Procedure: CARDIOVERSION;  Surgeon: Wellington Hampshire, MD;  Location: [facility] ORS;  Service: Cardiovascular;  Laterality: N/A;  . RADIOACTIVE SEED IMPLANT N/A     Current Outpatient Prescriptions  Medication Sig Dispense Refill  . apixaban (ELIQUIS) 5 MG TABS tablet Take 1 tablet (5 mg total) by mouth  2 (two) times daily. 60 tablet 11  . levothyroxine (SYNTHROID, LEVOTHROID) 50 MCG tablet Take 1 tablet (50 mcg total) by mouth daily before breakfast. 30 tablet 6  . magnesium gluconate (MAGONATE) 500 MG tablet Take 500 mg by mouth 2 (two) times daily.    . metoprolol (LOPRESSOR) 100 MG tablet Take 1 tablet (100 mg total) by mouth 2 (two) times daily.    . Multiple Vitamin (MULTIVITAMIN) tablet Take 1 tablet by mouth  daily.    . Potassium 99 MG TABS Take 99 mg by mouth daily.    . Selenium 200 MCG TABS Take 200 mcg by mouth daily.    Marland Kitchen amiodarone (PACERONE) 200 MG tablet Take 1 tablet (200 mg total) by mouth 2 (two) times daily. 70 tablet 3   No current facility-administered medications for this visit.      Allergies:   Patient has no known allergies.   Social History:  The patient  reports that he quit smoking about 21 years ago. His smoking use included Cigarettes. He quit smokeless tobacco use about 11 years ago. His smokeless tobacco use included Chew. He reports that he does not drink alcohol or use drugs.   Family History:   family history includes Heart attack in his mother; Heart disease in his father.    Review of Systems: Review of Systems  Constitutional: Positive for malaise/fatigue.       Flushing  Respiratory: Positive for shortness of breath.   Cardiovascular: Positive for palpitations.  Gastrointestinal: Negative.   Musculoskeletal: Negative.   Neurological: Negative.   Psychiatric/Behavioral: Negative.   All other systems reviewed and are negative.    PHYSICAL EXAM: VS:  BP 120/80 (BP Location: Left Arm, Patient Position: Sitting, Cuff Size: Normal)   Pulse (!) 123   Ht 6\' 1"  (1.854 m)   Wt 245 lb 8 oz (111.4 kg)   BMI 32.39 kg/m  , BMI Body mass index is 32.39 kg/m. GEN: Well nourished, well developed, in no acute distress  HEENT: normal  Neck: no JVD, carotid bruits, or masses Cardiac: Irregular rate and rhythm, rapid no murmurs, rubs, or gallops,no edema  Respiratory:  clear to auscultation bilaterally, normal work of breathing GI: soft, nontender, nondistended, + BS MS: no deformity or atrophy  Skin: warm and dry, no rash Neuro:  Strength and sensation are intact Psych: euthymic mood, full affect    Recent Labs: 09/07/2015: ALT 26; TSH 4.420 03/07/2016: BUN 13; Creatinine, Ser 1.05; Platelets 198; Potassium 4.2; Sodium 144    Lipid Panel Lab Results   Component Value Date   CHOL 161 09/07/2015   HDL 57 09/07/2015   LDLCALC 94 09/07/2015   TRIG 51 09/07/2015      Wt Readings from Last 3 Encounters:  05/30/16 245 lb 8 oz (111.4 kg)  03/07/16 243 lb (110.2 kg)  11/09/15 239 lb 8 oz (108.6 kg)       ASSESSMENT AND PLAN:  Persistent atrial fibrillation ([place]) - Plan: EKG 12-Lead Presenting today with atrial fibrillation with RVR rate 123 bpm Long discussion with him concerning timing of the onset of his symptoms, Likely presented March 11 approximately 4-5 days ago Monitoring closely at home, higher heart rate measured We have recommended he increase metoprolol up to 100 mg twice a day Stay on anticoagulation twice a day, eliquis He has been compliant on anticoagulation for the past month per the patient In effort to convert to normal sinus rhythm, Recommended he start amiodarone 400 mg twice a  day for 5 days then down to 200 mg twice a day Repeat EKG approximately 2 weeks time If he remains in atrial fibrillation, would recommend he undergo cardioversion   Essential hypertension We will increase metoprolol as above   OSA (obstructive sleep apnea) He does have sleep apnea, wears CPAP, is compliant   Total encounter time more than 25 minutes  Greater than 50% was spent in counseling and coordination of care with the patient  Disposition:   F/U   3 weeks    Orders Placed This Encounter  Procedures  . EKG 12-Lead     Signed, Esmond Plants, M.D., Ph.D. 05/30/2016  [place], [place]

## 2016-05-30 NOTE — Patient Instructions (Addendum)
Medication Instructions:   Please increase th metoprolol up to 100 mg twice a day  Please start amiodarone 400 mg twice a day for 5 days Then decrease down to 200 mg twice a day (Wednesday)  Labwork:  No new labs needed  Testing/Procedures:  No further testing at this time   I recommend watching educational videos on topics of interest to you at:       [email or web address]  Enter code: [email or web address]    Follow-Up: It was a pleasure seeing you in the office today. Please call us if you have new issues that need to be addressed before your next appt.  [phone]  Your physician wants you to follow-up in: 2 to 3 weeks   If you need a refill on your cardiac medications before your next appointment, please call your pharmacy.

## 2016-06-07 DIAGNOSIS — G4731 Primary central sleep apnea: Secondary | ICD-10-CM | POA: Diagnosis not present

## 2016-06-07 DIAGNOSIS — E669 Obesity, unspecified: Secondary | ICD-10-CM | POA: Diagnosis not present

## 2016-06-07 DIAGNOSIS — G4733 Obstructive sleep apnea (adult) (pediatric): Secondary | ICD-10-CM | POA: Diagnosis not present

## 2016-06-10 ENCOUNTER — Telehealth: Payer: Self-pay | Admitting: Cardiovascular Disease

## 2016-06-10 NOTE — Telephone Encounter (Signed)
Pt sates his "machine" is telling him he is not in afib anymore. He asks if he should continue the medication. States he has an appt on 4/5. Please call and advise.

## 2016-06-10 NOTE — Telephone Encounter (Signed)
Spoke w/ pt.  Advised him to continue amiodarone 200 mg BID as directed at last ov until his appt next week.  He states that his BP machine is showing regular rhythm, but overall he cannot tell a difference.  Recommended he keep upcoming appt and call back w/ any further questions or concerns.

## 2016-06-11 ENCOUNTER — Other Ambulatory Visit: Payer: Self-pay | Admitting: Cardiovascular Disease

## 2016-06-19 NOTE — Progress Notes (Signed)
Cardiology Office Note  Date:  06/20/2016   ID:  AB LEAMING, DOB [birth year], MRN [id number]  PCP:  Golden Pop, MD   Chief Complaint  Patient presents with  . other    3wk f/u. Pt states he is doing well. Reviewed meds with pt verbally.    HPI:  [name] [name] is a 71 y.o. male with history of   Afib with RVR with heart rates in the 180's in the setting of hypokalemia and colonoscopy prep,  HTN,  hypothyroidism,  sleep apnea, and  prostate cancer, previous cardioversion for Persistent atrial fibrillation He presents for routine follow-up of his atrial fibrillation  He was asymptomatic when in atrial fibrillation He has history of sleep apnea, wears his CPAP ECHO EF 50 to 55%, 04/18/2015  On his last clinic visit 05/30/16 he reported being in atrial fibrillation for several days 05/26/16, elevated HR at home on his measurements  flushed recently, does not feel as well when playing racquetball Wears CPAP, number of apnea events up to 15, previously 5 to 9 overnight  Heart Rate 123 bpm on EKG  compliant on his anticoagulation CHADS2VASc of 2 (HTN and age x 1).  Take eliquis 5 mg twice a day He has been taking metoprolol 50 mg twice a day  We started him on amiodarone 400 bid with taper after 5 days,metoprolol increased Back to NSR on 3/21 Then ran out of amio last few days Was on metoprolol 100 BID temporarily  Off benazepril BP at home 120/60 Back on metoprolol 50 BID   follows regular exercise program No chest pain or shortness of breath on exertion, now with new symptoms of palpitations, flushing  EKG on today's visit worsening reviewed by myself shows normal sinus rhythm rate 62 bpm no significant ST or T-wave changes Previous EKG showing atrial fib with rate 123 bpm, no significant ST or T-wave changes  Other past medical history reviewed He did have episode of hematuria May 2017 Had a Cystoscopy. No significant pathology per the patient No more  hematuria since then   Previous CT scan ABD showing no CAD, no aorta disease or iliac vessel   He was undergoing a bowel prep for a routine screening colonoscopy which he was at [facility] as an outpatient for on 04/18/15. he was noted to be in new onset Afib with RVR with HR in the 180's  He was completely asymptomatic.  Troponin was negative x 4. TSH 2.07, free T4 was elevated at 1.43, K+ 3.6, Mg++ 1.7.  Echo showed EF 50-55%, no RWMA, not sufficient to allow for LV diastolic function, mildly dilated left atrium at 46 mm, mild MR, normal RV systolic function, and normal PASP.   He was started on Eliquis given his CHADS2VASc of 2 (HTN and age x 1).   remote smoking history when he was in his 30s and 30s, Total cholesterol 160, ldl 94   PMH:   has a past medical history of Atrial fibrillation with RVR ([place]); Dupuytren's contracture; Essential hypertension (09/05/2014); Hypertension; Hypokalemia; Hypothyroidism; Malignant neoplasm of prostate ([place]) (09/05/2014); Mitral regurgitation; Nephrolithiasis; New onset atrial fibrillation ([place]) (04/18/2015); OSA (obstructive sleep apnea) (05/11/2015); and Persistent atrial fibrillation ([place]).  PSH:    Past Surgical History:  Procedure Laterality Date  . COLONOSCOPY WITH PROPOFOL N/A 04/18/2015   Procedure: COLONOSCOPY WITH PROPOFOL;  Surgeon: Lucilla Lame, MD;  Location: [facility] ENDOSCOPY;  Service: Endoscopy;  Laterality: N/A;  . ELECTROPHYSIOLOGIC STUDY N/A 05/05/2015   Procedure: CARDIOVERSION;  Surgeon:  Wellington Hampshire, MD;  Location: [facility] ORS;  Service: Cardiovascular;  Laterality: N/A;  . RADIOACTIVE SEED IMPLANT N/A     Current Outpatient Prescriptions  Medication Sig Dispense Refill  . ELIQUIS 5 MG TABS tablet TAKE ONE TABLET BY MOUTH TWICE DAILY 60 tablet 3  . levothyroxine (SYNTHROID, LEVOTHROID) 50 MCG tablet Take 1 tablet (50 mcg total) by mouth daily before breakfast. 30 tablet 6  . magnesium gluconate (MAGONATE) 500 MG tablet Take 500  mg by mouth 2 (two) times daily.    . metoprolol (LOPRESSOR) 100 MG tablet Take 100 mg by mouth 2 (two) times daily.    . Multiple Vitamin (MULTIVITAMIN) tablet Take 1 tablet by mouth daily.    . Potassium 99 MG TABS Take 99 mg by mouth daily.    . Selenium 200 MCG TABS Take 200 mcg by mouth daily.    Marland Kitchen amiodarone (PACERONE) 200 MG tablet Take 1 tablet (200 mg total) by mouth 2 (two) times daily as needed. 60 tablet 3   No current facility-administered medications for this visit.      Allergies:   Patient has no known allergies.   Social History:  The patient  reports that he quit smoking about 21 years ago. His smoking use included Cigarettes. He quit smokeless tobacco use about 11 years ago. His smokeless tobacco use included Chew. He reports that he does not drink alcohol or use drugs.   Family History:   family history includes Heart attack in his mother; Heart disease in his father.    Review of Systems: Review of Systems  Constitutional: Negative.   Respiratory: Negative.   Cardiovascular: Negative.   Gastrointestinal: Negative.   Musculoskeletal: Negative.   Neurological: Negative.   Psychiatric/Behavioral: Negative.   All other systems reviewed and are negative.    PHYSICAL EXAM: VS:  BP (!) 158/98 (BP Location: Left Arm, Patient Position: Sitting, Cuff Size: Normal)   Pulse 62   Ht 6\' 1"  (1.854 m)   Wt 247 lb 8 oz (112.3 kg)   BMI 32.65 kg/m  , BMI Body mass index is 32.65 kg/m. GEN: Well nourished, well developed, in no acute distress  HEENT: normal  Neck: no JVD, carotid bruits, or masses Cardiac: RRR; no murmurs, rubs, or gallops,no edema  Respiratory:  clear to auscultation bilaterally, normal work of breathing GI: soft, nontender, nondistended, + BS MS: no deformity or atrophy  Skin: warm and dry, no rash Neuro:  Strength and sensation are intact Psych: euthymic mood, full affect    Recent Labs: 09/07/2015: ALT 26; TSH 4.420 03/07/2016: BUN 13;  Creatinine, Ser 1.05; Platelets 198; Potassium 4.2; Sodium 144    Lipid Panel Lab Results  Component Value Date   CHOL 161 09/07/2015   HDL 57 09/07/2015   LDLCALC 94 09/07/2015   TRIG 51 09/07/2015      Wt Readings from Last 3 Encounters:  06/20/16 247 lb 8 oz (112.3 kg)  05/30/16 245 lb 8 oz (111.4 kg)  03/07/16 243 lb (110.2 kg)       ASSESSMENT AND PLAN:  Essential hypertension - Plan: EKG 12-Lead Elevated blood pressure on today's visit BP well controlled at home No med changes  Atrial fibrillation with RVR ([place]) - Plan: EKG 12-Lead Back in NSR, continue metoprolol 50 Bid He stopped amiodarone on his own, decrease the metoprolol on his own from 100 Discussed various treatment options for his atrial fibrillation He seems happy to stay on metoprolol 50 twice a day for  now, He will consider using amiodarone as needed if he converts back to Atrial fibrillation  Hypothyroidism, unspecified type - Plan: EKG 12-Lead  OSA (obstructive sleep apnea) - Plan: EKG 12-Lead Recommended that he wear his CPAP   Total encounter time more than 25 minutes  Greater than 50% was spent in counseling and coordination of care with the patient   Disposition:   F/U  12 months   Orders Placed This Encounter  Procedures  . EKG 12-Lead     Signed, Esmond Plants, M.D., Ph.D. 06/20/2016  [place], [place]

## 2016-06-20 ENCOUNTER — Encounter: Payer: Self-pay | Admitting: Cardiovascular Disease

## 2016-06-20 ENCOUNTER — Ambulatory Visit (INDEPENDENT_AMBULATORY_CARE_PROVIDER_SITE_OTHER): Payer: PPO | Admitting: Cardiovascular Disease

## 2016-06-20 VITALS — BP 158/98 | HR 62 | Ht 73.0 in | Wt 247.5 lb

## 2016-06-20 DIAGNOSIS — E039 Hypothyroidism, unspecified: Secondary | ICD-10-CM | POA: Diagnosis not present

## 2016-06-20 DIAGNOSIS — I10 Essential (primary) hypertension: Secondary | ICD-10-CM | POA: Diagnosis not present

## 2016-06-20 DIAGNOSIS — I4891 Unspecified atrial fibrillation: Secondary | ICD-10-CM | POA: Diagnosis not present

## 2016-06-20 DIAGNOSIS — G4733 Obstructive sleep apnea (adult) (pediatric): Secondary | ICD-10-CM | POA: Diagnosis not present

## 2016-06-20 MED ORDER — AMIODARONE HCL 200 MG PO TABS: 200.0000 mg | ORAL_TABLET | Freq: Two times a day (BID) | ORAL | 3 refills | Status: DC | PRN

## 2016-06-20 NOTE — Patient Instructions (Addendum)

## 2016-06-21 DIAGNOSIS — G4733 Obstructive sleep apnea (adult) (pediatric): Secondary | ICD-10-CM | POA: Diagnosis not present

## 2016-08-09 DIAGNOSIS — G4733 Obstructive sleep apnea (adult) (pediatric): Secondary | ICD-10-CM | POA: Diagnosis not present

## 2016-08-09 DIAGNOSIS — G4731 Primary central sleep apnea: Secondary | ICD-10-CM | POA: Diagnosis not present

## 2016-08-09 DIAGNOSIS — E669 Obesity, unspecified: Secondary | ICD-10-CM | POA: Diagnosis not present

## 2016-08-16 ENCOUNTER — Telehealth: Payer: Self-pay | Admitting: Family Medicine

## 2016-08-28 ENCOUNTER — Ambulatory Visit (INDEPENDENT_AMBULATORY_CARE_PROVIDER_SITE_OTHER): Payer: PPO

## 2016-08-28 VITALS — BP 132/78 | HR 62 | Temp 98.1°F | Resp 16 | Ht 73.0 in | Wt 245.0 lb

## 2016-08-28 DIAGNOSIS — Z1159 Encounter for screening for other viral diseases: Secondary | ICD-10-CM

## 2016-08-28 DIAGNOSIS — Z0000 Encounter for general adult medical examination without abnormal findings: Secondary | ICD-10-CM

## 2016-08-28 NOTE — Patient Instructions (Addendum)
[name] [name] , Thank you for taking time to come for your Medicare Wellness Visit. I appreciate your ongoing commitment to your health goals. Please review the following plan we discussed and let me know if I can assist you in the future.   Screening recommendations/referrals: Colonoscopy: completed 04/18/2015 Recommended yearly ophthalmology/optometry visit for glaucoma screening and checkup Recommended yearly dental visit for hygiene and checkup  Vaccinations: Influenza vaccine: up to date, due 12/2016 Pneumococcal vaccine: completed series Tdap vaccine: up to date Shingles vaccine: up to date  Advanced directives: Advanced Directives on file, declined any changes that need to be made today.   Conditions/risks identified: Recommend drinking at least 3-4 glasses of water a day   Next appointment: Follow up with Dr.Crissman on 09/12/2016 at 8am. Follow up in one year for your annual wellness exam.   Preventive Care 65 Years and Older, Male Preventive care refers to lifestyle choices and visits with your health care provider that can promote health and wellness. What does preventive care include?  A yearly physical exam. This is also called an annual well check.  Dental exams once or twice a year.  Routine eye exams. Ask your health care provider how often you should have your eyes checked.  Personal lifestyle choices, including:  Daily care of your teeth and gums.  Regular physical activity.  Eating a healthy diet.  Avoiding tobacco and drug use.  Limiting alcohol use.  Practicing safe sex.  Taking low doses of aspirin every day.  Taking vitamin and mineral supplements as recommended by your health care provider. What happens during an annual well check? The services and screenings done by your health care provider during your annual well check will depend on your age, overall health, lifestyle risk factors, and family history of disease. Counseling  Your health care  provider may ask you questions about your:  Alcohol use.  Tobacco use.  Drug use.  Emotional well-being.  Home and relationship well-being.  Sexual activity.  Eating habits.  History of falls.  Memory and ability to understand (cognition).  Work and work Statistician. Screening  You may have the following tests or measurements:  Height, weight, and BMI.  Blood pressure.  Lipid and cholesterol levels. These may be checked every 5 years, or more frequently if you are over 36 years old.  Skin check.  Lung cancer screening. You may have this screening every year starting at age 72 if you have a 30-pack-year history of smoking and currently smoke or have quit within the past 15 years.  Fecal occult blood test (FOBT) of the stool. You may have this test every year starting at age 72.  Flexible sigmoidoscopy or colonoscopy. You may have a sigmoidoscopy every 5 years or a colonoscopy every 10 years starting at age 72.  Prostate cancer screening. Recommendations will vary depending on your family history and other risks.  Hepatitis C blood test.  Hepatitis B blood test.  Sexually transmitted disease (STD) testing.  Diabetes screening. This is done by checking your blood sugar (glucose) after you have not eaten for a while (fasting). You may have this done every 1-3 years.  Abdominal aortic aneurysm (AAA) screening. You may need this if you are a current or former smoker.  Osteoporosis. You may be screened starting at age 72 if you are at high risk. Talk with your health care provider about your test results, treatment options, and if necessary, the need for more tests. Vaccines  Your health care provider may  recommend certain vaccines, such as:  Influenza vaccine. This is recommended every year.  Tetanus, diphtheria, and acellular pertussis (Tdap, Td) vaccine. You may need a Td booster every 10 years.  Zoster vaccine. You may need this after age 33.  Pneumococcal  13-valent conjugate (PCV13) vaccine. One dose is recommended after age 33.  Pneumococcal polysaccharide (PPSV23) vaccine. One dose is recommended after age 72. Talk to your health care provider about which screenings and vaccines you need and how often you need them. This information is not intended to replace advice given to you by your health care provider. Make sure you discuss any questions you have with your health care provider. Document Released: 03/31/2015 Document Revised: 11/22/2015 Document Reviewed: 01/03/2015 Elsevier Interactive Patient Education  2017 [place] Prevention in the Home Falls can cause injuries. They can happen to people of all ages. There are many things you can do to make your home safe and to help prevent falls. What can I do on the outside of my home?  Regularly fix the edges of walkways and driveways and fix any cracks.  Remove anything that might make you trip as you walk through a door, such as a raised step or threshold.  Trim any bushes or trees on the path to your home.  Use bright outdoor lighting.  Clear any walking paths of anything that might make someone trip, such as rocks or tools.  Regularly check to see if handrails are loose or broken. Make sure that both sides of any steps have handrails.  Any raised decks and porches should have guardrails on the edges.  Have any leaves, snow, or ice cleared regularly.  Use sand or salt on walking paths during winter.  Clean up any spills in your garage right away. This includes oil or grease spills. What can I do in the bathroom?  Use night lights.  Install grab bars by the toilet and in the tub and shower. Do not use towel bars as grab bars.  Use non-skid mats or decals in the tub or shower.  If you need to sit down in the shower, use a plastic, non-slip stool.  Keep the floor dry. Clean up any water that spills on the floor as soon as it happens.  Remove soap buildup in the  tub or shower regularly.  Attach bath mats securely with double-sided non-slip rug tape.  Do not have throw rugs and other things on the floor that can make you trip. What can I do in the bedroom?  Use night lights.  Make sure that you have a light by your bed that is easy to reach.  Do not use any sheets or blankets that are too big for your bed. They should not hang down onto the floor.  Have a firm chair that has side arms. You can use this for support while you get dressed.  Do not have throw rugs and other things on the floor that can make you trip. What can I do in the kitchen?  Clean up any spills right away.  Avoid walking on wet floors.  Keep items that you use a lot in easy-to-reach places.  If you need to reach something above you, use a strong step stool that has a grab bar.  Keep electrical cords out of the way.  Do not use floor polish or wax that makes floors slippery. If you must use wax, use non-skid floor wax.  Do not have throw rugs and  other things on the floor that can make you trip. What can I do with my stairs?  Do not leave any items on the stairs.  Make sure that there are handrails on both sides of the stairs and use them. Fix handrails that are broken or loose. Make sure that handrails are as long as the stairways.  Check any carpeting to make sure that it is firmly attached to the stairs. Fix any carpet that is loose or worn.  Avoid having throw rugs at the top or bottom of the stairs. If you do have throw rugs, attach them to the floor with carpet tape.  Make sure that you have a light switch at the top of the stairs and the bottom of the stairs. If you do not have them, ask someone to add them for you. What else can I do to help prevent falls?  Wear shoes that:  Do not have high heels.  Have rubber bottoms.  Are comfortable and fit you well.  Are closed at the toe. Do not wear sandals.  If you use a stepladder:  Make sure that it  is fully opened. Do not climb a closed stepladder.  Make sure that both sides of the stepladder are locked into place.  Ask someone to hold it for you, if possible.  Clearly mark and make sure that you can see:  Any grab bars or handrails.  First and last steps.  Where the edge of each step is.  Use tools that help you move around (mobility aids) if they are needed. These include:  Canes.  Walkers.  Scooters.  Crutches.  Turn on the lights when you go into a dark area. Replace any light bulbs as soon as they burn out.  Set up your furniture so you have a clear path. Avoid moving your furniture around.  If any of your floors are uneven, fix them.  If there are any pets around you, be aware of where they are.  Review your medicines with your doctor. Some medicines can make you feel dizzy. This can increase your chance of falling. Ask your doctor what other things that you can do to help prevent falls. This information is not intended to replace advice given to you by your health care provider. Make sure you discuss any questions you have with your health care provider. Document Released: 12/29/2008 Document Revised: 08/10/2015 Document Reviewed: 04/08/2014 Elsevier Interactive Patient Education  2017 [employer].

## 2016-08-28 NOTE — Progress Notes (Signed)
Subjective:   [name] [name] is a 71 y.o. male who presents for Medicare Annual/Subsequent preventive examination.  Review of Systems:   Cardiac Risk Factors include: male gender;advanced age (>23men, >50 women);hypertension;obesity (BMI >30kg/m2)     Objective:    Vitals: BP 132/78 (BP Location: Left Arm, Patient Position: Sitting)   Pulse 62   Temp 98.1 F (36.7 C)   Resp 16   Ht 6\' 1"  (1.854 m)   Wt 245 lb (111.1 kg)   BMI 32.32 kg/m   Body mass index is 32.32 kg/m.  Tobacco History  Smoking Status  . Former Smoker  . Types: Cigarettes  . Quit date: 09/05/1994  Smokeless Tobacco  . Former Systems developer  . Types: Chew  . Quit date: 09/04/2004     Counseling given: Not Answered   Past Medical History:  Diagnosis Date  . A-fib ([place])   . Atrial fibrillation with RVR ([place])   . Dupuytren's contracture   . Essential hypertension 09/05/2014  . Hypertension   . Hypokalemia   . Hypothyroidism   . 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 Mitral regurgitation    a. echo 03/2015: echo 03/2015: EF 50-55%, no RWMA, no sufficienct to allow for LV diastolic fxn, mild MR, LA mildly dilated at 46 mm, RV systolic fxn nl, PASP nl  . Nephrolithiasis   . New onset atrial fibrillation ([place]) 04/18/2015  . OSA (obstructive sleep apnea) 05/11/2015  . Persistent atrial fibrillation [facility])    Past Surgical History:  Procedure Laterality Date  . COLONOSCOPY WITH PROPOFOL N/A 04/18/2015   Procedure: COLONOSCOPY WITH PROPOFOL;  Surgeon: Lucilla Lame, MD;  Location: [facility] ENDOSCOPY;  Service: Endoscopy;  Laterality: N/A;  . ELECTROPHYSIOLOGIC STUDY N/A 05/05/2015   Procedure: CARDIOVERSION;  Surgeon: Wellington Hampshire, MD;  Location: [facility] ORS;  Service: Cardiovascular;  Laterality: N/A;  . RADIOACTIVE SEED IMPLANT N/A    Family History  Problem Relation Age of Onset  . Heart attack Mother   . Heart disease Father    History  Sexual Activity  . Sexual  activity: Not on file    Outpatient Encounter Prescriptions as of 08/28/2016  Medication Sig  . ELIQUIS 5 MG TABS tablet TAKE ONE TABLET BY MOUTH TWICE DAILY  . levothyroxine (SYNTHROID, LEVOTHROID) 50 MCG tablet Take 1 tablet (50 mcg total) by mouth daily before breakfast.  . magnesium gluconate (MAGONATE) 500 MG tablet Take 500 mg by mouth 2 (two) times daily.  . metoprolol (LOPRESSOR) 100 MG tablet Take 100 mg by mouth 2 (two) times daily.  . Multiple Vitamin (MULTIVITAMIN) tablet Take 1 tablet by mouth daily.  . Potassium 99 MG TABS Take 99 mg by mouth daily.  . Selenium 200 MCG TABS Take 200 mcg by mouth daily.  Marland Kitchen amiodarone (PACERONE) 200 MG tablet Take 1 tablet (200 mg total) by mouth 2 (two) times daily as needed. (Patient not taking: Reported on 08/28/2016)   No facility-administered encounter medications on file as of 08/28/2016.     Activities of Daily Living In your present state of health, do you have any difficulty performing the following activities: 08/28/2016 09/07/2015  Hearing? N N  Vision? N N  Difficulty concentrating or making decisions? N N  Walking or climbing stairs? N N  Dressing or bathing? N N  Doing errands, shopping? N N  Preparing Food and eating ? N -  Using the Toilet? N -  In the past six months, have  you accidently leaked urine? N -  Do you have problems with loss of bowel control? N -  Managing your Medications? N -  Managing your Finances? N -  Housekeeping or managing your Housekeeping? N -  Some recent data might be hidden    Patient Care Team: Guadalupe Maple, MD as PCP - General (Family Medicine) Guadalupe Maple, MD as PCP - Family Medicine (Family Medicine) Lucilla Lame, MD as Consulting Physician (Gastroenterology) Minna Merritts, MD as Consulting Physician (Cardiology) Dasher, Rayvon Char, MD as Consulting Physician (Dermatology)   Assessment:     Exercise Activities and Dietary recommendations Current Exercise Habits: Home exercise  routine  Goals    . Increase water intake          Recommend drinking at least 3-4 glasses of water a day       Fall Risk Fall Risk  08/28/2016 09/07/2015 03/07/2015  Falls in the past year? No No No   Depression Screen PHQ 2/9 Scores 08/28/2016 09/07/2015 03/07/2015  PHQ - 2 Score 0 0 0    Cognitive Function     6CIT Screen 08/28/2016  What Year? 0 points  What month? 0 points  What time? 0 points  Count back from 20 0 points  Months in reverse 0 points  Repeat phrase 0 points  Total Score 0    Immunization History  Administered Date(s) Administered  . Influenza, High Dose Seasonal PF 03/07/2016  . Influenza,inj,Quad PF,36+ Mos 03/07/2015  . Pneumococcal Conjugate-13 02/28/2014  . Pneumococcal Polysaccharide-23 08/02/2009  . Tdap 07/28/2008  . Zoster 08/14/2010   Screening Tests Health Maintenance  Topic Date Due  . Hepatitis C Screening  03/07/2017 (Originally 1946/01/20)  . INFLUENZA VACCINE  10/16/2016  . TETANUS/TDAP  07/29/2018  . COLONOSCOPY  04/17/2025      Plan:  I have personally reviewed and addressed the Medicare Annual Wellness questionnaire and have noted the following in the patient's chart:  A. Medical and social history B. Use of alcohol, tobacco or illicit drugs  C. Current medications and supplements D. Functional ability and status E.  Nutritional status F.  Physical activity G. Advance directives H. List of other physicians I.  Hospitalizations, surgeries, and ER visits in previous 12 months J.  [place] such as hearing and vision if needed, cognitive and depression L. Referrals and appointments   In addition, I have reviewed and discussed with patient certain preventive protocols, quality metrics, and best practice recommendations. A written personalized care plan for preventive services as well as general preventive health recommendations were provided to patient.   Signed,  Tyler Aas, LPN Nurse Health  Advisor   MD Recommendations:   , as supervising physician, have reviewed the nurse health advisors Medicare wellness visit note for this patient and concur with the findings and recommendations listed above. Golden Pop M.D.

## 2016-09-05 DIAGNOSIS — G4731 Primary central sleep apnea: Secondary | ICD-10-CM | POA: Diagnosis not present

## 2016-09-12 ENCOUNTER — Encounter: Payer: Self-pay | Admitting: Family Medicine

## 2016-09-12 ENCOUNTER — Ambulatory Visit (INDEPENDENT_AMBULATORY_CARE_PROVIDER_SITE_OTHER): Payer: PPO | Admitting: Family Medicine

## 2016-09-12 VITALS — BP 138/86 | HR 61 | Wt 245.0 lb

## 2016-09-12 DIAGNOSIS — I10 Essential (primary) hypertension: Secondary | ICD-10-CM

## 2016-09-12 DIAGNOSIS — I4819 Other persistent atrial fibrillation: Secondary | ICD-10-CM

## 2016-09-12 DIAGNOSIS — C61 Malignant neoplasm of prostate: Secondary | ICD-10-CM

## 2016-09-12 DIAGNOSIS — Z1159 Encounter for screening for other viral diseases: Secondary | ICD-10-CM | POA: Diagnosis not present

## 2016-09-12 DIAGNOSIS — E039 Hypothyroidism, unspecified: Secondary | ICD-10-CM | POA: Diagnosis not present

## 2016-09-12 DIAGNOSIS — Z0000 Encounter for general adult medical examination without abnormal findings: Secondary | ICD-10-CM

## 2016-09-12 DIAGNOSIS — G4733 Obstructive sleep apnea (adult) (pediatric): Secondary | ICD-10-CM | POA: Diagnosis not present

## 2016-09-12 DIAGNOSIS — Z131 Encounter for screening for diabetes mellitus: Secondary | ICD-10-CM

## 2016-09-12 DIAGNOSIS — I481 Persistent atrial fibrillation: Secondary | ICD-10-CM | POA: Diagnosis not present

## 2016-09-12 DIAGNOSIS — Z7189 Other specified counseling: Secondary | ICD-10-CM

## 2016-09-12 DIAGNOSIS — Z13220 Encounter for screening for lipoid disorders: Secondary | ICD-10-CM

## 2016-09-12 DIAGNOSIS — Z125 Encounter for screening for malignant neoplasm of prostate: Secondary | ICD-10-CM | POA: Diagnosis not present

## 2016-09-12 LAB — URINALYSIS, ROUTINE W REFLEX MICROSCOPIC
Bilirubin, UA: NEGATIVE
Glucose, UA: NEGATIVE
Ketones, UA: NEGATIVE
LEUKOCYTES UA: NEGATIVE
NITRITE UA: NEGATIVE
PH UA: 5.5 (ref 5.0–7.5)
Protein, UA: NEGATIVE
RBC UA: NEGATIVE
SPEC GRAV UA: 1.025 (ref 1.005–1.030)
Urobilinogen, Ur: 0.2 mg/dL (ref 0.2–1.0)

## 2016-09-12 MED ORDER — LEVOTHYROXINE SODIUM 50 MCG PO TABS: 50.0000 ug | ORAL_TABLET | Freq: Every day | ORAL | 4 refills | Status: DC

## 2016-09-12 NOTE — Assessment & Plan Note (Signed)
A voluntary discussion about advance care planning including the explanation and discussion of advance directives was extensively discussed  with the patient.  Explanation about the health care proxy and Living will was reviewed and packet with forms with explanation of how to fill them out was given. Patient has already completed this and has these information on file

## 2016-09-12 NOTE — Progress Notes (Signed)
BP 138/86 (BP Location: Left Arm)   Pulse 61   Wt 245 lb (111.1 kg)   SpO2 97%   BMI 32.32 kg/m    Subjective:    Patient ID: [name] [name], male    DOB: [birth year], 71 y.o.   MRN: [id number]  HPI: [name] [name] is a 71 y.o. male  Chief Complaint  Patient presents with  . Annual Exam  Patient all in all doing well has had some intermittent episodes of atrial fibrillation. Cardiology is managing Pacerone, Toprol all. Also Eliquis. Patient doing well no excessive bleeding or bruising. Does have some conjunctival hemorrhage woke up with this morning hasn't been practically bothered by dry eyes. Taking thyroid without problems metoprolol 200 mg a day with no symptoms. Patient also with some nasals stopped up at night is on CPAP and wondering about things to do to open his head up.  Relevant past medical, surgical, family and social history reviewed and updated as indicated. Interim medical history since our last visit reviewed. Allergies and medications reviewed and updated.  Review of Systems  Constitutional: Negative.   HENT: Negative.   Eyes: Negative.   Respiratory: Negative.   Cardiovascular: Negative.   Gastrointestinal: Negative.   Endocrine: Negative.   Genitourinary: Negative.   Musculoskeletal: Negative.   Skin: Negative.   Allergic/Immunologic: Negative.   Neurological: Negative.   Hematological: Negative.   Psychiatric/Behavioral: Negative.     Per HPI unless specifically indicated above     Objective:    BP 138/86 (BP Location: Left Arm)   Pulse 61   Wt 245 lb (111.1 kg)   SpO2 97%   BMI 32.32 kg/m   Wt Readings from Last 3 Encounters:  09/12/16 245 lb (111.1 kg)  08/28/16 245 lb (111.1 kg)  06/20/16 247 lb 8 oz (112.3 kg)    Physical Exam  Constitutional: He is oriented to person, place, and time. He appears well-developed and well-nourished.  HENT:  Head: Normocephalic and atraumatic.  Right Ear: External ear normal.  Left Ear:  External ear normal.  Eyes: Conjunctivae and EOM are normal. Pupils are equal, round, and reactive to light.  Neck: Normal range of motion. Neck supple.  Cardiovascular: Normal rate, regular rhythm, normal heart sounds and intact distal pulses.   Pulmonary/Chest: Effort normal and breath sounds normal.  Abdominal: Soft. Bowel sounds are normal. There is no splenomegaly or hepatomegaly.  Genitourinary:  Genitourinary Comments: Done at urology  Musculoskeletal: Normal range of motion.  Neurological: He is alert and oriented to person, place, and time. He has normal reflexes.  Skin: No rash noted. No erythema.  Psychiatric: He has a normal mood and affect. His behavior is normal. Judgment and thought content normal.    Results for orders placed or performed in visit on 03/07/16  CBC with Differential/Platelet  Result Value Ref Range   WBC 5.7 3.4 - 10.8 x10E3/uL   RBC 4.85 4.14 - 5.80 x10E6/uL   Hemoglobin 14.8 13.0 - 17.7 g/dL   Hematocrit 45.4 37.5 - 51.0 %   MCV 94 79 - 97 fL   MCH 30.5 26.6 - 33.0 pg   MCHC 32.6 31.5 - 35.7 g/dL   RDW 13.6 12.3 - 15.4 %   Platelets 198 150 - 379 x10E3/uL   Neutrophils 56 Not Estab. %   Lymphs 29 Not Estab. %   Monocytes 11 Not Estab. %   Eos 4 Not Estab. %   Basos 0 Not Estab. %   Neutrophils Absolute  3.2 1.4 - 7.0 x10E3/uL   Lymphocytes Absolute 1.7 0.7 - 3.1 x10E3/uL   Monocytes Absolute 0.6 0.1 - 0.9 x10E3/uL   EOS (ABSOLUTE) 0.2 0.0 - 0.4 x10E3/uL   Basophils Absolute 0.0 0.0 - 0.2 x10E3/uL   Immature Granulocytes 0 Not Estab. %   Immature Grans (Abs) 0.0 0.0 - 0.1 [id number]/GN  Basic metabolic panel  Result Value Ref Range   Glucose 103 (H) 65 - 99 mg/dL   BUN 13 8 - 27 mg/dL   Creatinine, Ser 1.05 0.76 - 1.27 mg/dL   GFR calc non Af Amer 71 >59 mL/min/1.73   GFR calc Af Amer 82 >59 mL/min/1.73   BUN/Creatinine Ratio 12 10 - 24   Sodium 144 134 - 144 mmol/L   Potassium 4.2 3.5 - 5.2 mmol/L   Chloride 106 96 - 106 mmol/L   CO2 25 18  - 29 mmol/L   Calcium 9.0 8.6 - 10.2 mg/dL      Assessment & Plan:   Problem List Items Addressed This Visit      Cardiovascular and Mediastinum   Essential hypertension - Primary (Chronic)    The current medical regimen is effective;  continue present plan and medications.       Relevant Orders   CBC with Differential/Platelet   Persistent atrial fibrillation (HCC)    Followed by cardiology and stable        Respiratory   OSA (obstructive sleep apnea)    Just had sleep study last week pending report        Endocrine   Hypothyroidism (Chronic)    The current medical regimen is effective;  continue present plan and medications.       Relevant Medications   levothyroxine (SYNTHROID, LEVOTHROID) 50 MCG tablet   Other Relevant Orders   TSH     Genitourinary   Malignant neoplasm of prostate ([place])    Followed by urology.        Other   Advanced care planning/counseling discussion    A voluntary discussion about advance care planning including the explanation and discussion of advance directives was extensively discussed  with the patient.  Explanation about the health care proxy and Living will was reviewed and packet with forms with explanation of how to fill them out was given. Patient has already completed this and has these information on file        Other Visit Diagnoses    Encounter for Medicare annual wellness exam       Screening for diabetes mellitus (DM)       Relevant Orders   Comprehensive metabolic panel   Urinalysis, Routine w reflex microscopic   Prostate cancer screening       Relevant Orders   PSA   Screening cholesterol level       Relevant Orders   Lipid panel   Need for hepatitis C screening test           Follow up plan: Return in about 6 months (around 03/14/2017) for BMP,CBC.

## 2016-09-12 NOTE — Assessment & Plan Note (Signed)
Followed by urology.   

## 2016-09-12 NOTE — Assessment & Plan Note (Signed)
The current medical regimen is effective;  continue present plan and medications.  

## 2016-09-12 NOTE — Assessment & Plan Note (Signed)
Followed by cardiology and stable 

## 2016-09-12 NOTE — Assessment & Plan Note (Signed)
Just had sleep study last week pending report

## 2016-09-13 LAB — CBC WITH DIFFERENTIAL/PLATELET
BASOS: 1 %
Basophils Absolute: 0 10*3/uL (ref 0.0–0.2)
EOS (ABSOLUTE): 0.2 10*3/uL (ref 0.0–0.4)
Eos: 4 %
HEMATOCRIT: 45.6 % (ref 37.5–51.0)
Hemoglobin: 15.2 g/dL (ref 13.0–17.7)
IMMATURE GRANULOCYTES: 0 %
Immature Grans (Abs): 0 10*3/uL (ref 0.0–0.1)
LYMPHS ABS: 1.5 10*3/uL (ref 0.7–3.1)
Lymphs: 34 %
MCH: 31.5 pg (ref 26.6–33.0)
MCHC: 33.3 g/dL (ref 31.5–35.7)
MCV: 95 fL (ref 79–97)
MONOS ABS: 0.5 10*3/uL (ref 0.1–0.9)
Monocytes: 12 %
NEUTROS ABS: 2.2 10*3/uL (ref 1.4–7.0)
NEUTROS PCT: 49 %
PLATELETS: 182 10*3/uL (ref 150–379)
RBC: 4.82 x10E6/uL (ref 4.14–5.80)
RDW: 14.6 % (ref 12.3–15.4)
WBC: 4.4 10*3/uL (ref 3.4–10.8)

## 2016-09-13 LAB — TSH: TSH: 4.36 u[IU]/mL (ref 0.450–4.500)

## 2016-09-13 LAB — LIPID PANEL
CHOLESTEROL TOTAL: 164 mg/dL (ref 100–199)
Chol/HDL Ratio: 3.1 ratio (ref 0.0–5.0)
HDL: 53 mg/dL (ref >39)
LDL CALC: 102 mg/dL — AB (ref 0–99)
TRIGLYCERIDES: 44 mg/dL (ref 0–149)
VLDL CHOLESTEROL CAL: 9 mg/dL (ref 5–40)

## 2016-09-13 LAB — COMPREHENSIVE METABOLIC PANEL
A/G RATIO: 2.3 — AB (ref 1.2–2.2)
ALK PHOS: 93 IU/L (ref 39–117)
ALT: 28 IU/L (ref 0–44)
AST: 23 IU/L (ref 0–40)
Albumin: 4.2 g/dL (ref 3.5–4.8)
BILIRUBIN TOTAL: 1 mg/dL (ref 0.0–1.2)
BUN/Creatinine Ratio: 13 (ref 10–24)
BUN: 16 mg/dL (ref 8–27)
CHLORIDE: 108 mmol/L — AB (ref 96–106)
CO2: 23 mmol/L (ref 20–29)
Calcium: 9.1 mg/dL (ref 8.6–10.2)
Creatinine, Ser: 1.21 mg/dL (ref 0.76–1.27)
GFR calc Af Amer: 69 mL/min/{1.73_m2} (ref >59)
GFR calc non Af Amer: 60 mL/min/{1.73_m2} (ref >59)
GLOBULIN, TOTAL: 1.8 g/dL (ref 1.5–4.5)
Glucose: 98 mg/dL (ref 65–99)
POTASSIUM: 4.3 mmol/L (ref 3.5–5.2)
SODIUM: 145 mmol/L — AB (ref 134–144)
Total Protein: 6 g/dL (ref 6.0–8.5)

## 2016-09-13 LAB — PSA: Prostate Specific Ag, Serum: <0.1 ng/mL (ref 0.0–4.0)

## 2016-09-16 ENCOUNTER — Encounter: Payer: Self-pay | Admitting: Family Medicine

## 2016-10-07 DIAGNOSIS — L570 Actinic keratosis: Secondary | ICD-10-CM | POA: Diagnosis not present

## 2016-10-07 DIAGNOSIS — L821 Other seborrheic keratosis: Secondary | ICD-10-CM | POA: Diagnosis not present

## 2016-10-07 DIAGNOSIS — D2262 Melanocytic nevi of left upper limb, including shoulder: Secondary | ICD-10-CM | POA: Diagnosis not present

## 2016-10-07 DIAGNOSIS — D2261 Melanocytic nevi of right upper limb, including shoulder: Secondary | ICD-10-CM | POA: Diagnosis not present

## 2016-10-07 DIAGNOSIS — Z85828 Personal history of other malignant neoplasm of skin: Secondary | ICD-10-CM | POA: Diagnosis not present

## 2016-10-07 DIAGNOSIS — X32XXXA Exposure to sunlight, initial encounter: Secondary | ICD-10-CM | POA: Diagnosis not present

## 2016-10-10 ENCOUNTER — Other Ambulatory Visit: Payer: Self-pay | Admitting: Cardiovascular Disease

## 2016-10-18 ENCOUNTER — Ambulatory Visit: Payer: PPO

## 2016-10-22 ENCOUNTER — Ambulatory Visit (INDEPENDENT_AMBULATORY_CARE_PROVIDER_SITE_OTHER): Payer: PPO | Admitting: Urology

## 2016-10-22 ENCOUNTER — Encounter: Payer: Self-pay | Admitting: Urology

## 2016-10-22 VITALS — BP 154/95 | HR 65 | Ht 73.0 in | Wt 247.8 lb

## 2016-10-22 DIAGNOSIS — R310 Gross hematuria: Secondary | ICD-10-CM

## 2016-10-22 DIAGNOSIS — C61 Malignant neoplasm of prostate: Secondary | ICD-10-CM

## 2016-10-22 NOTE — Progress Notes (Signed)
10/22/2016 11:31 AM   [name] [name] 09/29/1945 [id number]  Referring provider: Guadalupe Maple, MD [address] [place], [place] [address]  No chief complaint on file.   HPI: The patient a 71 y.o. Male Who presents for gross hematuria.  1. Gross hematuria The patient noted gross hematuria. He did pass small clots. This happened couple of times. His symptoms resolved. He is a former smoker. He does have a history of nephrolithiasis though does not report the pain that he typically feels passing a stone. CT Urogram unremarkable and cystoscopy unremarkable except for hypervascular prostate in August 2017. Recent U/A negative for blood. Denies any gross hematuria since his one and only episode when he underwent hematuria workup.  2. 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 PSA June 2018 was undeterctable.        PMH: Past Medical History:  Diagnosis Date  . A-fib ([place])   . Atrial fibrillation with RVR ([place])   . Dupuytren's contracture   . Essential hypertension 09/05/2014  . Hypertension   . Hypokalemia   . Hypothyroidism   . Malignant neoplasm of prostate ([place]) 09/05/2014   prostate,skin of the scalp  . Mitral regurgitation    a. echo 03/2015: echo 03/2015: EF 50-55%, no RWMA, no sufficienct to allow for LV diastolic fxn, mild MR, LA mildly dilated at 46 mm, RV systolic fxn nl, PASP nl  . Nephrolithiasis   . New onset atrial fibrillation ([place]) 04/18/2015  . OSA (obstructive sleep apnea) 05/11/2015  . Persistent atrial fibrillation [facility])     Surgical History: Past Surgical History:  Procedure Laterality Date  . COLONOSCOPY WITH PROPOFOL N/A 04/18/2015   Procedure: COLONOSCOPY WITH PROPOFOL;  Surgeon: Lucilla Lame, MD;  Location: [facility] ENDOSCOPY;  Service: Endoscopy;  Laterality: N/A;  . ELECTROPHYSIOLOGIC STUDY N/A 05/05/2015   Procedure: CARDIOVERSION;  Surgeon: Wellington Hampshire, MD;  Location: [facility] ORS;  Service: Cardiovascular;   Laterality: N/A;  . RADIOACTIVE SEED IMPLANT N/A     Home Medications:  Allergies as of 10/22/2016   No Known Allergies     Medication List       Accurate as of 10/22/16 11:31 AM. Always use your most recent med list.          amiodarone 200 MG tablet Commonly known as:  PACERONE Take 1 tablet (200 mg total) by mouth 2 (two) times daily as needed.   ELIQUIS 5 MG Tabs tablet Generic drug:  apixaban TAKE 1 TABLET BY MOUTH TWICE DAILY   levothyroxine 50 MCG tablet Commonly known as:  SYNTHROID, LEVOTHROID Take 1 tablet (50 mcg total) by mouth daily before breakfast.   magnesium gluconate 500 MG tablet Commonly known as:  MAGONATE Take 500 mg by mouth 2 (two) times daily.   metoprolol tartrate 100 MG tablet Commonly known as:  LOPRESSOR Take 100 mg by mouth 2 (two) times daily.   multivitamin tablet Take 1 tablet by mouth daily.   Potassium 99 MG Tabs Take 99 mg by mouth daily.   Selenium 200 MCG Tabs Take 200 mcg by mouth daily.       Allergies: No Known Allergies  Family History: Family History  Problem Relation Age of Onset  . Heart attack Mother   . Heart disease Father     Social History:  reports that he quit smoking about 22 years ago. His smoking use included Cigarettes. He quit smokeless tobacco use about 12 years ago. His smokeless tobacco  use included Chew. He reports that he does not drink alcohol or use drugs.  ROS: UROLOGY Frequent Urination?: No Hard to postpone urination?: No Burning/pain with urination?: No Get up at night to urinate?: No Leakage of urine?: No Urine stream starts and stops?: No Trouble starting stream?: No Do you have to strain to urinate?: No Blood in urine?: No Urinary tract infection?: No Sexually transmitted disease?: No Injury to kidneys or bladder?: No Painful intercourse?: No Weak stream?: No Erection problems?: No Penile pain?: No  Gastrointestinal Nausea?: No Vomiting?: No Indigestion/heartburn?:  No Diarrhea?: No Constipation?: No  Constitutional Fever: No Night sweats?: No Weight loss?: No Fatigue?: No  Skin Skin rash/lesions?: No Itching?: No  Eyes Blurred vision?: No Double vision?: No  Ears/Nose/Throat Sore throat?: No Sinus problems?: No  Hematologic/Lymphatic Swollen glands?: No Easy bruising?: No  Cardiovascular Leg swelling?: No Chest pain?: No  Respiratory Cough?: No Shortness of breath?: No  Endocrine Excessive thirst?: No  Musculoskeletal Back pain?: No Joint pain?: No  Neurological Headaches?: No Dizziness?: No  Psychologic Depression?: No Anxiety?: No  Physical Exam: BP (!) 154/95 (BP Location: Left Arm, Patient Position: Sitting, Cuff Size: Normal)   Pulse 65   Ht 6\' 1"  (1.854 m)   Wt 247 lb 12.8 oz (112.4 kg)   BMI 32.69 kg/m   Constitutional:  Alert and oriented, No acute distress. HEENT:  AT, moist mucus membranes.  Trachea midline, no masses. Cardiovascular: No clubbing, cyanosis, or edema. Respiratory: Normal respiratory effort, no increased work of breathing. GI: Abdomen is soft, nontender, nondistended, no abdominal masses GU: No CVA tenderness.  Skin: No rashes, bruises or suspicious lesions. Lymph: No cervical or inguinal adenopathy. Neurologic: Grossly intact, no focal deficits, moving all 4 extremities. Psychiatric: Normal mood and affect.  Laboratory Data: Lab Results  Component Value Date   WBC 4.4 09/12/2016   HGB 15.2 09/12/2016   HCT 45.6 09/12/2016   MCV 95 09/12/2016   PLT 182 09/12/2016    Lab Results  Component Value Date   CREATININE 1.21 09/12/2016    Lab Results  Component Value Date   PSA 0.1 01/10/2012   PSA 0.4 01/04/2011   PSA  07/05/2010    ========== TEST NAME ==========  ========= RESULTS =========  = REFERENCE RANGE =  PROSTATE-SPECIFIC AG.  PSA, Serum (Serial Monitor) Prostate Specific Ag, Serum     [   0.8 ng/mL            ]           0.0-4.0 Roche ECLIA methodology.                                                                       . According to the American Urological Association, Serum PSA should decrease and remain at undetectable levels after radical prostatectomy. The AUA defines biochemical recurrence as an initial PSA value 0.2 ng/mL or greater followed by a subsequent confirmatory PSA value 0.2 ng/mL or greater. Values obtained with different assay methods or kits cannot be used interchangeably. Results cannot be interpreted as absolute evidence of the presence or absence of malignant disease.               [employer]  No: [id number]           [address], [place], [place] [address]           [name] Romp, MD         [phone]   Result(s) reported on 06 Jul 2010 at 12:50PM.     No results found for: TESTOSTERONE  No results found for: HGBA1C  Urinalysis    Component Value Date/Time   APPEARANCEUR Clear 09/12/2016 0836   GLUCOSEU Negative 09/12/2016 0836   BILIRUBINUR Negative 09/12/2016 0836   PROTEINUR Negative 09/12/2016 0836   NITRITE Negative 09/12/2016 0836   LEUKOCYTESUR Negative 09/12/2016 0836    Assessment & Plan:    1. Gross hematuria -Resolved. He will contact the office if this returns. May consider finasteride if it ever became frequent due to his finding of hypervascularity of his prostate on cystoscopy.  2. History of prostate cancer -Stable. Due for repeat PSA in June 2018. His PCP does check this annually. He will continue to have this checked with his PCP P. If it does rise, he will contact the office. At this point he can follow-up with [place] as needed.  Return if symptoms worsen or fail to improve.  [name] Retort, MD  [facility] [address] [place], [place] [address] [phone]

## 2017-01-11 DIAGNOSIS — G4733 Obstructive sleep apnea (adult) (pediatric): Secondary | ICD-10-CM | POA: Diagnosis not present

## 2017-02-02 ENCOUNTER — Other Ambulatory Visit: Payer: Self-pay | Admitting: Cardiovascular Disease

## 2017-02-03 NOTE — Telephone Encounter (Signed)
Please review for refill, thanks ! 

## 2017-02-11 DIAGNOSIS — G4733 Obstructive sleep apnea (adult) (pediatric): Secondary | ICD-10-CM | POA: Diagnosis not present

## 2017-02-21 DIAGNOSIS — G4733 Obstructive sleep apnea (adult) (pediatric): Secondary | ICD-10-CM | POA: Diagnosis not present

## 2017-02-21 DIAGNOSIS — E669 Obesity, unspecified: Secondary | ICD-10-CM | POA: Diagnosis not present

## 2017-02-21 DIAGNOSIS — E039 Hypothyroidism, unspecified: Secondary | ICD-10-CM | POA: Diagnosis not present

## 2017-02-21 DIAGNOSIS — I10 Essential (primary) hypertension: Secondary | ICD-10-CM | POA: Diagnosis not present

## 2017-02-21 DIAGNOSIS — I4891 Unspecified atrial fibrillation: Secondary | ICD-10-CM | POA: Diagnosis not present

## 2017-03-06 ENCOUNTER — Ambulatory Visit: Payer: PPO | Admitting: Family Medicine

## 2017-03-10 ENCOUNTER — Ambulatory Visit: Payer: PPO | Admitting: Family Medicine

## 2017-03-13 DIAGNOSIS — G4733 Obstructive sleep apnea (adult) (pediatric): Secondary | ICD-10-CM | POA: Diagnosis not present

## 2017-03-26 ENCOUNTER — Encounter: Payer: Self-pay | Admitting: Family Medicine

## 2017-03-26 ENCOUNTER — Other Ambulatory Visit: Payer: Self-pay | Admitting: Cardiovascular Disease

## 2017-03-26 ENCOUNTER — Ambulatory Visit (INDEPENDENT_AMBULATORY_CARE_PROVIDER_SITE_OTHER): Payer: PPO | Admitting: Family Medicine

## 2017-03-26 VITALS — BP 138/82 | HR 60 | Wt 243.0 lb

## 2017-03-26 DIAGNOSIS — E039 Hypothyroidism, unspecified: Secondary | ICD-10-CM

## 2017-03-26 DIAGNOSIS — I4891 Unspecified atrial fibrillation: Secondary | ICD-10-CM | POA: Diagnosis not present

## 2017-03-26 DIAGNOSIS — Z23 Encounter for immunization: Secondary | ICD-10-CM | POA: Diagnosis not present

## 2017-03-26 DIAGNOSIS — I10 Essential (primary) hypertension: Secondary | ICD-10-CM | POA: Diagnosis not present

## 2017-03-26 NOTE — Progress Notes (Signed)
BP 138/82 (BP Location: Left Arm)   Pulse 60   Wt 243 lb (110.2 kg)   SpO2 98%   BMI 32.06 kg/m    Subjective:    Patient ID: [name] [name], male    DOB: 1945/10/23, 72 y.o.   MRN: [id number]  HPI: [name] [name] is a 72 y.o. male  Patient all in all doing well has had some intermittent episodes of atrial fibrillation. Cardiology is managing Pacerone, metoprolol, Also Eliquis. Patient doing well no excessive bleeding or bruising.  Taking thyroid without problems metoprolol 200 mg a day with no symptoms. BP doing well. Relevant past medical, surgical, family and social history reviewed and updated as indicated. Interim medical history since our last visit reviewed. Allergies and medications reviewed and updated.  Review of Systems  Constitutional: Negative.   Respiratory: Negative.   Cardiovascular: Negative.     Per HPI unless specifically indicated above     Objective:    BP 138/82 (BP Location: Left Arm)   Pulse 60   Wt 243 lb (110.2 kg)   SpO2 98%   BMI 32.06 kg/m   Wt Readings from Last 3 Encounters:  03/26/17 243 lb (110.2 kg)  10/22/16 247 lb 12.8 oz (112.4 kg)  09/12/16 245 lb (111.1 kg)    Physical Exam  Constitutional: He is oriented to person, place, and time. He appears well-developed and well-nourished.  HENT:  Head: Normocephalic and atraumatic.  Eyes: Conjunctivae and EOM are normal.  Neck: Normal range of motion.  Cardiovascular: Normal rate, regular rhythm and normal heart sounds.  Pulmonary/Chest: Effort normal and breath sounds normal.  Musculoskeletal: Normal range of motion.  Neurological: He is alert and oriented to person, place, and time.  Skin: No erythema.  Psychiatric: He has a normal mood and affect. His behavior is normal. Judgment and thought content normal.    Results for orders placed or performed in visit on 09/12/16  CBC with Differential/Platelet  Result Value Ref Range   WBC 4.4 3.4 - 10.8 x10E3/uL   RBC 4.82 4.14  - 5.80 x10E6/uL   Hemoglobin 15.2 13.0 - 17.7 g/dL   Hematocrit 45.6 37.5 - 51.0 %   MCV 95 79 - 97 fL   MCH 31.5 26.6 - 33.0 pg   MCHC 33.3 31.5 - 35.7 g/dL   RDW 14.6 12.3 - 15.4 %   Platelets 182 150 - 379 x10E3/uL   Neutrophils 49 Not Estab. %   Lymphs 34 Not Estab. %   Monocytes 12 Not Estab. %   Eos 4 Not Estab. %   Basos 1 Not Estab. %   Neutrophils Absolute 2.2 1.4 - 7.0 x10E3/uL   Lymphocytes Absolute 1.5 0.7 - 3.1 x10E3/uL   Monocytes Absolute 0.5 0.1 - 0.9 x10E3/uL   EOS (ABSOLUTE) 0.2 0.0 - 0.4 x10E3/uL   Basophils Absolute 0.0 0.0 - 0.2 x10E3/uL   Immature Granulocytes 0 Not Estab. %   Immature Grans (Abs) 0.0 0.0 - 0.1 x10E3/uL  Comprehensive metabolic panel  Result Value Ref Range   Glucose 98 65 - 99 mg/dL   BUN 16 8 - 27 mg/dL   Creatinine, Ser 1.21 0.76 - 1.27 mg/dL   GFR calc non Af Amer 60 >59 mL/min/1.73   GFR calc Af Amer 69 >59 mL/min/1.73   BUN/Creatinine Ratio 13 10 - 24   Sodium 145 (H) 134 - 144 mmol/L   Potassium 4.3 3.5 - 5.2 mmol/L   Chloride 108 (H) 96 - 106 mmol/L  CO2 23 20 - 29 mmol/L   Calcium 9.1 8.6 - 10.2 mg/dL   Total Protein 6.0 6.0 - 8.5 g/dL   Albumin 4.2 3.5 - 4.8 g/dL   Globulin, Total 1.8 1.5 - 4.5 g/dL   Albumin/Globulin Ratio 2.3 (H) 1.2 - 2.2   Bilirubin Total 1.0 0.0 - 1.2 mg/dL   Alkaline Phosphatase 93 39 - 117 IU/L   AST 23 0 - 40 IU/L   ALT 28 0 - 44 IU/L  Lipid panel  Result Value Ref Range   Cholesterol, Total 164 100 - 199 mg/dL   Triglycerides 44 0 - 149 mg/dL   HDL 53 >39 mg/dL   VLDL Cholesterol Cal 9 5 - 40 mg/dL   LDL Calculated 102 (H) 0 - 99 mg/dL   Chol/HDL Ratio 3.1 0.0 - 5.0 ratio  PSA  Result Value Ref Range   Prostate Specific Ag, Serum <0.1 0.0 - 4.0 ng/mL  TSH  Result Value Ref Range   TSH 4.360 0.450 - 4.500 uIU/mL  Urinalysis, Routine w reflex microscopic  Result Value Ref Range   Specific Gravity, UA 1.025 1.005 - 1.030   pH, UA 5.5 5.0 - 7.5   Color, UA Yellow Yellow   Appearance Ur  Clear Clear   Leukocytes, UA Negative Negative   Protein, UA Negative Negative/Trace   Glucose, UA Negative Negative   Ketones, UA Negative Negative   RBC, UA Negative Negative   Bilirubin, UA Negative Negative   Urobilinogen, Ur 0.2 0.2 - 1.0 mg/dL   Nitrite, UA Negative Negative      Assessment & Plan:   Problem List Items Addressed This Visit      Cardiovascular and Mediastinum   Essential hypertension - Primary (Chronic)    The current medical regimen is effective;  continue present plan and medications.       Relevant Orders   Basic metabolic panel   CBC with Differential/Platelet   Atrial fibrillation with RVR (HCC)    Stable The current medical regimen is effective;  continue present plan and medications.         Endocrine   Hypothyroidism (Chronic)    The current medical regimen is effective;  continue present plan and medications.       Relevant Orders   Basic metabolic panel   CBC with Differential/Platelet    Other Visit Diagnoses    Needs flu shot       Relevant Orders   Flu vaccine HIGH DOSE PF (Fluzone High dose) (Completed)       Follow up plan: Return in about 6 months (around 09/23/2017) for Physical Exam.

## 2017-03-26 NOTE — Assessment & Plan Note (Signed)
The current medical regimen is effective;  continue present plan and medications.  

## 2017-03-26 NOTE — Assessment & Plan Note (Signed)
Stable. The current medical regimen is effective;  continue present plan and medications.  

## 2017-03-27 ENCOUNTER — Encounter: Payer: Self-pay | Admitting: Family Medicine

## 2017-03-27 LAB — BASIC METABOLIC PANEL
BUN/Creatinine Ratio: 13 (ref 10–24)
BUN: 15 mg/dL (ref 8–27)
CALCIUM: 9.1 mg/dL (ref 8.6–10.2)
CO2: 22 mmol/L (ref 20–29)
CREATININE: 1.13 mg/dL (ref 0.76–1.27)
Chloride: 108 mmol/L — ABNORMAL HIGH (ref 96–106)
GFR calc Af Amer: 75 mL/min/{1.73_m2} (ref >59)
GFR calc non Af Amer: 65 mL/min/{1.73_m2} (ref >59)
GLUCOSE: 92 mg/dL (ref 65–99)
Potassium: 4.6 mmol/L (ref 3.5–5.2)
Sodium: 145 mmol/L — ABNORMAL HIGH (ref 134–144)

## 2017-03-27 LAB — CBC WITH DIFFERENTIAL/PLATELET
BASOS ABS: 0 10*3/uL (ref 0.0–0.2)
Basos: 1 %
EOS (ABSOLUTE): 0.1 10*3/uL (ref 0.0–0.4)
EOS: 2 %
HEMATOCRIT: 47.5 % (ref 37.5–51.0)
HEMOGLOBIN: 15.6 g/dL (ref 13.0–17.7)
IMMATURE GRANULOCYTES: 0 %
Immature Grans (Abs): 0 10*3/uL (ref 0.0–0.1)
LYMPHS: 37 %
Lymphocytes Absolute: 1.8 10*3/uL (ref 0.7–3.1)
MCH: 30.8 pg (ref 26.6–33.0)
MCHC: 32.8 g/dL (ref 31.5–35.7)
MCV: 94 fL (ref 79–97)
MONOCYTES: 17 %
Monocytes Absolute: 0.8 10*3/uL (ref 0.1–0.9)
NEUTROS PCT: 43 %
Neutrophils Absolute: 2.1 10*3/uL (ref 1.4–7.0)
Platelets: 185 10*3/uL (ref 150–379)
RBC: 5.06 x10E6/uL (ref 4.14–5.80)
RDW: 14.2 % (ref 12.3–15.4)
WBC: 4.8 10*3/uL (ref 3.4–10.8)

## 2017-04-13 DIAGNOSIS — G4733 Obstructive sleep apnea (adult) (pediatric): Secondary | ICD-10-CM | POA: Diagnosis not present

## 2017-04-14 DIAGNOSIS — Z85828 Personal history of other malignant neoplasm of skin: Secondary | ICD-10-CM | POA: Diagnosis not present

## 2017-04-14 DIAGNOSIS — D2272 Melanocytic nevi of left lower limb, including hip: Secondary | ICD-10-CM | POA: Diagnosis not present

## 2017-04-14 DIAGNOSIS — D225 Melanocytic nevi of trunk: Secondary | ICD-10-CM | POA: Diagnosis not present

## 2017-04-14 DIAGNOSIS — L570 Actinic keratosis: Secondary | ICD-10-CM | POA: Diagnosis not present

## 2017-04-14 DIAGNOSIS — D044 Carcinoma in situ of skin of scalp and neck: Secondary | ICD-10-CM | POA: Diagnosis not present

## 2017-04-14 DIAGNOSIS — X32XXXA Exposure to sunlight, initial encounter: Secondary | ICD-10-CM | POA: Diagnosis not present

## 2017-04-14 DIAGNOSIS — D2261 Melanocytic nevi of right upper limb, including shoulder: Secondary | ICD-10-CM | POA: Diagnosis not present

## 2017-04-14 DIAGNOSIS — D485 Neoplasm of uncertain behavior of skin: Secondary | ICD-10-CM | POA: Diagnosis not present

## 2017-05-08 ENCOUNTER — Telehealth: Payer: Self-pay | Admitting: Family Medicine

## 2017-05-08 DIAGNOSIS — E039 Hypothyroidism, unspecified: Secondary | ICD-10-CM

## 2017-05-08 NOTE — Telephone Encounter (Signed)
Pt states [facility] needs another RX  For Levothyroxine  due to the manufacturer of the medication changing. Pt states he only has 2 pills left and would like a 90 suppply. Previous prescription was written on 09/12/17 with 90 tabs and 4 additional refills.

## 2017-05-08 NOTE — Telephone Encounter (Signed)
Copied from [place] [phone]. Topic: Quick Communication - Rx Refill/Question >> May 08, 2017  3:31 PM Clack, Laban Emperor wrote: Medication:  levothyroxine (SYNTHROID, LEVOTHROID) 50 MCG tablet [[id number]]    Has the patient contacted their pharmacy? Yes.     (Agent: If no, request that the patient contact the pharmacy for the refill.)   Preferred Pharmacy (with phone number or street name): [place] ([facility] [place] [phone] (Phone) [phone] (Fax)  Pt states [facility] adv him they need another Rx b/c the medication manufacturers has change. Also states he only has 2 pills left. (90 day supply)   Agent: Please be advised that RX refills may take up to 3 business days. We ask that you follow-up with your pharmacy.

## 2017-05-09 MED ORDER — LEVOTHYROXINE SODIUM 50 MCG PO TABS: 50.0000 ug | ORAL_TABLET | Freq: Every day | ORAL | 2 refills | Status: DC

## 2017-05-14 DIAGNOSIS — G4733 Obstructive sleep apnea (adult) (pediatric): Secondary | ICD-10-CM | POA: Diagnosis not present

## 2017-06-03 DIAGNOSIS — D049 Carcinoma in situ of skin, unspecified: Secondary | ICD-10-CM | POA: Diagnosis not present

## 2017-06-09 ENCOUNTER — Other Ambulatory Visit: Payer: Self-pay | Admitting: Cardiovascular Disease

## 2017-06-10 NOTE — Telephone Encounter (Signed)
Refill Request.  

## 2017-06-11 DIAGNOSIS — G4733 Obstructive sleep apnea (adult) (pediatric): Secondary | ICD-10-CM | POA: Diagnosis not present

## 2017-06-21 NOTE — Progress Notes (Signed)
Cardiology Office Note  Date:  06/24/2017   ID:  [name] [name], DOB 05-10-1945, MRN [id number]  PCP:  Guadalupe Maple, MD   Chief Complaint  Patient presents with  . OTHER    12 month f/u no complaints today. Pt has two Metoprolol 50 and 100 mg tablet please advise correct dose. Meds reviewed verbally with pt.     HPI:  [name] [name] is a 72 y.o. male with history of   Afib with RVR with heart rates in the 180's in the setting of hypokalemia and colonoscopy prep,  HTN,  hypothyroidism,  sleep apnea on cpap prostate cancer, previous cardioversion for Persistent atrial fibrillation He presents for routine follow-up of his atrial fibrillation  He was asymptomatic when in atrial fibrillation Monitors BP at home, Reads irregular at times Takes amiodarone as needed for irregular rhythm  Denies any significant shortness of breath or chest pain No regular exercise program, good activity level Sleep apnea, wears his CPAP  He has a new device to measure heart rate provided by his daughter Always reads irregular He brought it to the clinic today, we have tested it, he is normal sinus rhythm by EKG  but it is reading irregular  EKG personally reviewed by myself on todays visit  shows normal sinus rhythm rate 62 bpm no significant ST or T wave changes  Other past medical history reviewed Previous echocardiogram ECHO EF 50 to 55%, 04/18/2015  Prior clinic visit 05/30/16 he reported being in atrial fibrillation for several days 05/26/16, elevated HR at home on his measurements  flushed recently, does not feel as well when playing racquetball Wears CPAP, number of apnea events up to 15, previously 5 to 9 overnight  Heart Rate 123 bpm on EKG  compliant on his anticoagulation CHADS2VASc of 2 (HTN and age x 1).  Take eliquis 5 mg twice a day He has been taking metoprolol 50 mg twice a day  We started him on amiodarone 400 bid with taper after 5 days,metoprolol increased Back  to NSR on 3/21 Then ran out of amio last few days Was on metoprolol 100 BID temporarily  Was previously on benazepril, stopped for low blood pressure  He did have episode of hematuria May 2017 Had a Cystoscopy. No significant pathology per the patient No more hematuria since then   Previous CT scan ABD showing no CAD, no aorta disease or iliac vessel   He was undergoing a bowel prep for a routine screening colonoscopy which he was at [facility] as an [facility] for on 04/18/15. he was noted to be in new onset Afib with RVR with HR in the 180's  He was completely asymptomatic.  Troponin was negative x 4. TSH 2.07, free T4 was elevated at 1.43, K+ 3.6, Mg++ 1.7.  Echo showed EF 50-55%, no RWMA, not sufficient to allow for LV diastolic function, mildly dilated left atrium at 46 mm, mild MR, normal RV systolic function, and normal PASP.   He was started on Eliquis given his CHADS2VASc of 2 (HTN and age x 1).   remote smoking history when he was in his 40s and 19s, Total cholesterol 160, ldl 54   PMH:   has a past medical history of A-fib ([place]), Atrial fibrillation with RVR ([place]), Dupuytren's contracture, Essential hypertension (09/05/2014), Hypertension, Hypokalemia, Hypothyroidism, Malignant neoplasm of prostate ([place]) (09/05/2014), Mitral regurgitation, Nephrolithiasis, New onset atrial fibrillation ([place]) (04/18/2015), OSA (obstructive sleep apnea) (05/11/2015), and Persistent atrial fibrillation ([place]).  PSH:  Past Surgical History:  Procedure Laterality Date  . COLONOSCOPY WITH PROPOFOL N/A 04/18/2015   Procedure: COLONOSCOPY WITH PROPOFOL;  Surgeon: Lucilla Lame, MD;  Location: [facility] ENDOSCOPY;  Service: Endoscopy;  Laterality: N/A;  . ELECTROPHYSIOLOGIC STUDY N/A 05/05/2015   Procedure: CARDIOVERSION;  Surgeon: Wellington Hampshire, MD;  Location: [facility] ORS;  Service: Cardiovascular;  Laterality: N/A;  . RADIOACTIVE SEED IMPLANT N/A     Current Outpatient Medications  Medication Sig  Dispense Refill  . amiodarone (PACERONE) 200 MG tablet Take 1 tablet (200 mg total) by mouth daily. 90 tablet 3  . apixaban (ELIQUIS) 5 MG TABS tablet Take 1 tablet (5 mg total) by mouth 2 (two) times daily. 180 tablet 3  . levothyroxine (SYNTHROID, LEVOTHROID) 50 MCG tablet Take 1 tablet (50 mcg total) by mouth daily before breakfast. 90 tablet 2  . metoprolol tartrate (LOPRESSOR) 50 MG tablet Take 1 tablet (50 mg total) by mouth 2 (two) times daily. 180 tablet 3  . Multiple Vitamin (MULTIVITAMIN) tablet Take 1 tablet by mouth daily.     No current facility-administered medications for this visit.      Allergies:   Patient has no known allergies.   Social History:  The patient  reports that he quit smoking about 22 years ago. His smoking use included cigarettes. He quit smokeless tobacco use about 12 years ago. His smokeless tobacco use included chew. He reports that he does not drink alcohol or use drugs.   Family History:   family history includes Heart attack in his mother; Heart disease in his father.    Review of Systems: Review of Systems  Constitutional: Negative.   Respiratory: Negative.   Cardiovascular: Negative.   Gastrointestinal: Negative.   Musculoskeletal: Negative.   Neurological: Negative.   Psychiatric/Behavioral: Negative.   All other systems reviewed and are negative.    PHYSICAL EXAM: VS:  BP (!) 144/84 (BP Location: Left Arm, Patient Position: Sitting, Cuff Size: Normal)   Pulse 62   Ht 6\' 1"  (1.854 m)   Wt 241 lb 8 oz (109.5 kg)   BMI 31.86 kg/m  , BMI Body mass index is 31.86 kg/m. Constitutional:  oriented to person, place, and time. No distress.  HENT:  Head: Normocephalic and atraumatic.  Eyes:  no discharge. No scleral icterus.  Neck: Normal range of motion. Neck supple. No JVD present.  Cardiovascular: Normal rate, regular rhythm, normal heart sounds and intact distal pulses. Exam reveals no gallop and no friction rub. No edema No murmur  heard. Pulmonary/Chest: Effort normal and breath sounds normal. No stridor. No respiratory distress.  no wheezes.  no rales.  no tenderness.  Abdominal: Soft.  no distension.  no tenderness.  Musculoskeletal: Normal range of motion.  no  tenderness or deformity.  Neurological:  normal muscle tone. Coordination normal. No atrophy Skin: Skin is warm and dry. No rash noted. not diaphoretic.  Psychiatric:  normal mood and affect. behavior is normal. Thought content normal.    Recent Labs: 09/12/2016: ALT 28; TSH 4.360 03/26/2017: BUN 15; Creatinine, Ser 1.13; Hemoglobin 15.6; Platelets 185; Potassium 4.6; Sodium 145    Lipid Panel Lab Results  Component Value Date   CHOL 164 09/12/2016   HDL 53 09/12/2016   LDLCALC 102 (H) 09/12/2016   TRIG 44 09/12/2016      Wt Readings from Last 3 Encounters:  06/24/17 241 lb 8 oz (109.5 kg)  03/26/17 243 lb (110.2 kg)  10/22/16 247 lb 12.8 oz (112.4 kg)  ASSESSMENT AND PLAN:  Essential hypertension - Plan: EKG 12-Lead Blood pressure is well controlled on today's visit. No changes made to the medications.  Atrial fibrillation with RVR ([place]) - Plan: EKG 12-Lead Maintaining normal sinus rhythm but having paroxysmal episodes by blood pressure cuff measurements Long discussion with him concerning monitoring devices that are not working Recommended he stick with his blood pressure cuff and manual palpation of his radial artery discussed and shown to him today Recommended he stay on amiodarone 200 mg daily metoprolol 50 twice daily with Eliquis 5 twice daily  Hypothyroidism, unspecified type - Plan: EKG 12-Lead Managed by primary care  OSA (obstructive sleep apnea) - Plan: EKG 12-Lead Recommended that he wear his CPAP Reports he is compliant   Total encounter time more than 25 minutes  Greater than 50% was spent in counseling and coordination of care with the patient   Disposition:   F/U  12 months   Orders Placed This Encounter   Procedures  . EKG 12-Lead     Signed, Esmond Plants, M.D., Ph.D. 06/24/2017  [place], [place]

## 2017-06-24 ENCOUNTER — Ambulatory Visit: Payer: PPO | Admitting: Cardiovascular Disease

## 2017-06-24 ENCOUNTER — Encounter: Payer: Self-pay | Admitting: Cardiovascular Disease

## 2017-06-24 VITALS — BP 144/84 | HR 62 | Ht 73.0 in | Wt 241.5 lb

## 2017-06-24 DIAGNOSIS — G4733 Obstructive sleep apnea (adult) (pediatric): Secondary | ICD-10-CM | POA: Diagnosis not present

## 2017-06-24 DIAGNOSIS — I481 Persistent atrial fibrillation: Secondary | ICD-10-CM

## 2017-06-24 DIAGNOSIS — I10 Essential (primary) hypertension: Secondary | ICD-10-CM | POA: Diagnosis not present

## 2017-06-24 DIAGNOSIS — E039 Hypothyroidism, unspecified: Secondary | ICD-10-CM

## 2017-06-24 DIAGNOSIS — I4819 Other persistent atrial fibrillation: Secondary | ICD-10-CM

## 2017-06-24 MED ORDER — AMIODARONE HCL 200 MG PO TABS: 200.0000 mg | ORAL_TABLET | Freq: Every day | ORAL | 3 refills | Status: DC

## 2017-06-24 MED ORDER — METOPROLOL TARTRATE 50 MG PO TABS: 50.0000 mg | ORAL_TABLET | Freq: Two times a day (BID) | ORAL | 3 refills | Status: DC

## 2017-06-24 MED ORDER — APIXABAN 5 MG PO TABS: 5.0000 mg | ORAL_TABLET | Freq: Two times a day (BID) | ORAL | 3 refills | Status: DC

## 2017-06-24 NOTE — Patient Instructions (Addendum)
Medication Instructions:   Take amiodarone once a day  Labwork:  No new labs needed  Testing/Procedures:  No further testing at this time   Follow-Up: It was a pleasure seeing you in the office today. Please call us if you have new issues that need to be addressed before your next appt.  [phone]  Your physician wants you to follow-up in: 12 months.  You will receive a reminder letter in the mail two months in advance. If you don't receive a letter, please call our office to schedule the follow-up appointment.  If you need a refill on your cardiac medications before your next appointment, please call your pharmacy.  For educational health videos Log in to : [email or web address] Or : [email or web address], password : [email or web address]

## 2017-07-12 DIAGNOSIS — G4733 Obstructive sleep apnea (adult) (pediatric): Secondary | ICD-10-CM | POA: Diagnosis not present

## 2017-08-11 DIAGNOSIS — G4733 Obstructive sleep apnea (adult) (pediatric): Secondary | ICD-10-CM | POA: Diagnosis not present

## 2017-09-03 ENCOUNTER — Ambulatory Visit (INDEPENDENT_AMBULATORY_CARE_PROVIDER_SITE_OTHER): Payer: PPO

## 2017-09-03 VITALS — BP 162/88 | HR 61 | Temp 97.8°F | Resp 16 | Ht 73.0 in | Wt 239.3 lb

## 2017-09-03 DIAGNOSIS — Z1159 Encounter for screening for other viral diseases: Secondary | ICD-10-CM | POA: Diagnosis not present

## 2017-09-03 DIAGNOSIS — Z0000 Encounter for general adult medical examination without abnormal findings: Secondary | ICD-10-CM | POA: Diagnosis not present

## 2017-09-03 NOTE — Patient Instructions (Addendum)
[name] , Thank you for taking time to come for yourMedicare Wellness Visit. I appreciate your ongoing commitment to your health goals. Please review the following plan we discussed and let me know if I can assist you in the future.   Screening recommendations/referrals: Colonoscopy: completed 04/18/2015 Recommended yearly ophthalmology/optometry visit for glaucoma screening and checkup Recommended yearly dental visit for hygiene and checkup  Vaccinations: Influenza vaccine: up to date, due 11/2017 Pneumococcal vaccine: completed series Tdap vaccine: up to date Shingles vaccine: shingrix eligible, check with your insurance company for coverage.    Advanced directives: Advanced Directives on file  Conditions/risks identified: Recommend drinking at least 6-8 glasses of water a day   Next appointment: Follow up with Dr.Crissman on 09/24/2017 at 9:00am. Follow up in one year for your annual wellness exam.    Preventive Care 65 Years and Older, Male Preventive care refers to lifestyle choices and visits with your health care provider that can promote health and wellness. What does preventive care include?  A yearly physical exam. This is also called an annual well check.  Dental exams once or twice a year.  Routine eye exams. Ask your health care provider how often you should have your eyes checked.  Personal lifestyle choices, including:  Daily care of your teeth and gums.  Regular physical activity.  Eating a healthy diet.  Avoiding tobacco and drug use.  Limiting alcohol use.  Practicing safe sex.  Taking low doses of aspirin every day.  Taking vitamin and mineral supplements as recommended by your health care provider. What happens during an annual well check? The services and screenings done by your health care provider during your annual well check will depend on your age, overall health, lifestyle risk factors, and family history of disease. Counseling    Your health care provider may ask you questions about your:  Alcohol use.  Tobacco use.  Drug use.  Emotional well-being.  Home and relationship well-being.  Sexual activity.  Eating habits.  History of falls.  Memory and ability to understand (cognition).  Work and work Statistician. Screening  You may have the following tests or measurements:  Height, weight, and BMI.  Blood pressure.  Lipid and cholesterol levels. These may be checked every 5 years, or more frequently if you are over 24 years old.  Skin check.  Lung cancer screening. You may have this screening every year starting at age 45 if you have a 30-pack-year history of smoking and currently smoke or have quit within the past 15 years.  Fecal occult blood test (FOBT) of the stool. You may have this test every year starting at age 75.  Flexible sigmoidoscopy or colonoscopy. You may have a sigmoidoscopy every 5 years or a colonoscopy every 10 years starting at age 89.  Prostate cancer screening. Recommendations will vary depending on your family history and other risks.  Hepatitis C blood test.  Hepatitis B blood test.  Sexually transmitted disease (STD) testing.  Diabetes screening. This is done by checking your blood sugar (glucose) after you have not eaten for a while (fasting). You may have this done every 1-3 years.  Abdominal aortic aneurysm (AAA) screening. You may need this if you are a current or former smoker.  Osteoporosis. You may be screened starting at age 64 if you are at high risk. Talk with your health care provider about your test results, treatment options, and if necessary, the need for more tests. Vaccines  Your health care provider may recommend  certain vaccines, such as:  Influenza vaccine. This is recommended every year.  Tetanus, diphtheria, and acellular pertussis (Tdap, Td) vaccine. You may need a Td booster every 10 years.  Zoster vaccine. You may need this after age  52.  Pneumococcal 13-valent conjugate (PCV13) vaccine. One dose is recommended after age 28.  Pneumococcal polysaccharide (PPSV23) vaccine. One dose is recommended after age 13. Talk to your health care provider about which screenings and vaccines you need and how often you need them. This information is not intended to replace advice given to you by your health care provider. Make sure you discuss any questions you have with your health care provider. Document Released: 03/31/2015 Document Revised: 11/22/2015 Document Reviewed: 01/03/2015 Elsevier Interactive Patient Education  2017 [place] Prevention in the Home Falls can cause injuries. They can happen to people of all ages. There are many things you can do to make your home safe and to help prevent falls. What can I do on the outside of my home?  Regularly fix the edges of walkways and driveways and fix any cracks.  Remove anything that might make you trip as you walk through a door, such as a raised step or threshold.  Trim any bushes or trees on the path to your home.  Use bright outdoor lighting.  Clear any walking paths of anything that might make someone trip, such as rocks or tools.  Regularly check to see if handrails are loose or broken. Make sure that both sides of any steps have handrails.  Any raised decks and porches should have guardrails on the edges.  Have any leaves, snow, or ice cleared regularly.  Use sand or salt on walking paths during winter.  Clean up any spills in your garage right away. This includes oil or grease spills. What can I do in the bathroom?  Use night lights.  Install grab bars by the toilet and in the tub and shower. Do not use towel bars as grab bars.  Use non-skid mats or decals in the tub or shower.  If you need to sit down in the shower, use a plastic, non-slip stool.  Keep the floor dry. Clean up any water that spills on the floor as soon as it happens.  Remove  soap buildup in the tub or shower regularly.  Attach bath mats securely with double-sided non-slip rug tape.  Do not have throw rugs and other things on the floor that can make you trip. What can I do in the bedroom?  Use night lights.  Make sure that you have a light by your bed that is easy to reach.  Do not use any sheets or blankets that are too big for your bed. They should not hang down onto the floor.  Have a firm chair that has side arms. You can use this for support while you get dressed.  Do not have throw rugs and other things on the floor that can make you trip. What can I do in the kitchen?  Clean up any spills right away.  Avoid walking on wet floors.  Keep items that you use a lot in easy-to-reach places.  If you need to reach something above you, use a strong step stool that has a grab bar.  Keep electrical cords out of the way.  Do not use floor polish or wax that makes floors slippery. If you must use wax, use non-skid floor wax.  Do not have throw rugs and other  things on the floor that can make you trip. What can I do with my stairs?  Do not leave any items on the stairs.  Make sure that there are handrails on both sides of the stairs and use them. Fix handrails that are broken or loose. Make sure that handrails are as long as the stairways.  Check any carpeting to make sure that it is firmly attached to the stairs. Fix any carpet that is loose or worn.  Avoid having throw rugs at the top or bottom of the stairs. If you do have throw rugs, attach them to the floor with carpet tape.  Make sure that you have a light switch at the top of the stairs and the bottom of the stairs. If you do not have them, ask someone to add them for you. What else can I do to help prevent falls?  Wear shoes that:  Do not have high heels.  Have rubber bottoms.  Are comfortable and fit you well.  Are closed at the toe. Do not wear sandals.  If you use a  stepladder:  Make sure that it is fully opened. Do not climb a closed stepladder.  Make sure that both sides of the stepladder are locked into place.  Ask someone to hold it for you, if possible.  Clearly mark and make sure that you can see:  Any grab bars or handrails.  First and last steps.  Where the edge of each step is.  Use tools that help you move around (mobility aids) if they are needed. These include:  Canes.  Walkers.  Scooters.  Crutches.  Turn on the lights when you go into a dark area. Replace any light bulbs as soon as they burn out.  Set up your furniture so you have a clear path. Avoid moving your furniture around.  If any of your floors are uneven, fix them.  If there are any pets around you, be aware of where they are.  Review your medicines with your doctor. Some medicines can make you feel dizzy. This can increase your chance of falling. Ask your doctor what other things that you can do to help prevent falls. This information is not intended to replace advice given to you by your health care provider. Make sure you discuss any questions you have with your health care provider. Document Released: 12/29/2008 Document Revised: 08/10/2015 Document Reviewed: 04/08/2014 Elsevier Interactive Patient Education  2017 [employer].

## 2017-09-03 NOTE — Progress Notes (Signed)
Subjective:   [name] [name] is a 72 y.o. male who presents for Medicare Annual/Subsequent preventive examination.  Review of Systems:   Cardiac Risk Factors include: advanced age (>14men, >36 women);obesity (BMI >30kg/m2);male gender;hypertension;dyslipidemia     Objective:    Vitals: BP (!) 162/88 (BP Location: Left Arm, Patient Position: Sitting)   Pulse 61   Temp 97.8 F (36.6 C) (Temporal)   Resp 16   Ht 6\' 1"  (1.854 m)   Wt 239 lb 4.8 oz (108.5 kg)   BMI 31.57 kg/m   Body mass index is 31.57 kg/m.  Advanced Directives 09/03/2017 08/28/2016 05/05/2015 04/18/2015 04/18/2015  Does Patient Have a Medical Advance Directive? Yes Yes No Yes Yes  Type of Advance Directive Living will;Healthcare Power of [place];Living will - Living will Living will;Healthcare Power of Attorney  Does patient want to make changes to medical advance directive? - - - No - Patient declined No - Patient declined  Copy of [place] in Chart? Yes Yes No - copy requested No - copy requested No - copy requested  Would patient like information on creating a medical advance directive? - - No - patient declined information - -    Tobacco Social History   Tobacco Use  Smoking Status Former Smoker  . Types: Cigarettes  . Last attempt to quit: 09/05/1994  . Years since quitting: 23.0  Smokeless Tobacco Former Systems developer  . Types: Chew  . Quit date: 09/04/2004     Counseling given: Not Answered   Clinical Intake:  Pre-visit preparation completed: Yes  Pain : 0-10 Pain Score: 1  Pain Type: Acute pain Pain Location: Foot(arch of foot) Pain Orientation: Left Pain Descriptors / Indicators: Aching Pain Onset: 1 to 4 weeks ago Pain Frequency: Intermittent     Nutritional Status: BMI > 30  Obese Nutritional Risks: None Diabetes: No  How often do you need to have someone help you when you read instructions, pamphlets, or other written materials from your  doctor or pharmacy?: 1 - Never What is the last grade level you completed in school?: college   Interpreter Needed?: No  Information entered by :: Helayne Metsker,LPN   Past Medical History:  Diagnosis Date  . A-fib ([place])   . Atrial fibrillation with RVR ([place])   . Dupuytren's contracture   . Essential hypertension 09/05/2014  . Hypertension   . Hypokalemia   . Hypothyroidism   . Malignant neoplasm of prostate ([place]) 09/05/2014   prostate,skin of the scalp  . Mitral regurgitation    a. echo 03/2015: echo 03/2015: EF 50-55%, no RWMA, no sufficienct to allow for LV diastolic fxn, mild MR, LA mildly dilated at 46 mm, RV systolic fxn nl, PASP nl  . Nephrolithiasis   . New onset atrial fibrillation ([place]) 04/18/2015  . OSA (obstructive sleep apnea) 05/11/2015  . Persistent atrial fibrillation [facility])    Past Surgical History:  Procedure Laterality Date  . COLONOSCOPY WITH PROPOFOL N/A 04/18/2015   Procedure: COLONOSCOPY WITH PROPOFOL;  Surgeon: Lucilla Lame, MD;  Location: [facility] ENDOSCOPY;  Service: Endoscopy;  Laterality: N/A;  . ELECTROPHYSIOLOGIC STUDY N/A 05/05/2015   Procedure: CARDIOVERSION;  Surgeon: Wellington Hampshire, MD;  Location: [facility] ORS;  Service: Cardiovascular;  Laterality: N/A;  . RADIOACTIVE SEED IMPLANT N/A    Family History  Problem Relation Age of Onset  . Heart attack Mother   . Heart disease Father    Social History   Socioeconomic History  . Marital  status: Married    Spouse name: Not on file  . Number of children: Not on file  . Years of education: Not on file  . Highest education level: Not on file  Occupational History  . Not on file  Social Needs  . Financial resource strain: Not hard at all  . Food insecurity:    Worry: Never true    Inability: Never true  . Transportation needs:    Medical: No    Non-medical: No  Tobacco Use  . Smoking status: Former Smoker    Types: Cigarettes    Last attempt to quit: 09/05/1994    Years since quitting: 23.0  .  Smokeless tobacco: Former Systems developer    Types: [place] date: 09/04/2004  Substance and Sexual Activity  . Alcohol use: No  . Drug use: No  . Sexual activity: Not on file  Lifestyle  . Physical activity:    Days per week: 2 days    Minutes per session: 120 min  . Stress: Not at all  Relationships  . Social connections:    Talks on phone: Never    Gets together: Three times a week    Attends religious service: More than 4 times per year    Active member of club or organization: Yes    Attends meetings of clubs or organizations: More than 4 times per year    Relationship status: Married  Other Topics Concern  . Not on file  Social History Narrative  . Not on file    Outpatient Encounter Medications as of 09/03/2017  Medication Sig  . amiodarone (PACERONE) 200 MG tablet Take 1 tablet (200 mg total) by mouth daily.  Marland Kitchen apixaban (ELIQUIS) 5 MG TABS tablet Take 1 tablet (5 mg total) by mouth 2 (two) times daily.  Marland Kitchen levothyroxine (SYNTHROID, LEVOTHROID) 50 MCG tablet Take 1 tablet (50 mcg total) by mouth daily before breakfast.  . metoprolol tartrate (LOPRESSOR) 50 MG tablet Take 1 tablet (50 mg total) by mouth 2 (two) times daily.  . Multiple Vitamin (MULTIVITAMIN) tablet Take 1 tablet by mouth daily.   No facility-administered encounter medications on file as of 09/03/2017.     Activities of Daily Living In your present state of health, do you have any difficulty performing the following activities: 09/03/2017  Hearing? N  Vision? N  Difficulty concentrating or making decisions? N  Walking or climbing stairs? N  Dressing or bathing? N  Doing errands, shopping? N  Preparing Food and eating ? N  Using the Toilet? N  In the past six months, have you accidently leaked urine? N  Do you have problems with loss of bowel control? N  Managing your Medications? N  Managing your Finances? N  Housekeeping or managing your Housekeeping? N  Some recent data might be hidden    Patient  Care Team: Guadalupe Maple, MD as PCP - General (Family Medicine) Guadalupe Maple, MD as PCP - Family Medicine (Family Medicine) Lucilla Lame, MD as Consulting Physician (Gastroenterology) Minna Merritts, MD as Consulting Physician (Cardiology) Dasher, Rayvon Char, MD as Consulting Physician (Dermatology)   Assessment:   This is a routine wellness examination for [place].  Exercise Activities and Dietary recommendations Current Exercise Habits: The patient does not participate in regular exercise at present;Home exercise routine, Time (Minutes): 60, Frequency (Times/Week): 2, Weekly Exercise (Minutes/Week): 120, Intensity: Mild, Exercise limited by: None identified  Goals    . DIET - INCREASE WATER INTAKE  Recommend drinking at least 6-8 glasses of water a day        Fall Risk Fall Risk  09/03/2017 09/12/2016 08/28/2016 09/07/2015 03/07/2015  Falls in the past year? No No No No No   Is the patient's home free of loose throw rugs in walkways, pet beds, electrical cords, etc?   yes      Grab bars in the bathroom? no      Handrails on the stairs?   No stairs       Adequate lighting?   yes  Timed Get Up and Go Performed: Completed in 8 seconds with no use of assistive devices, steady gait. No intervention needed at this time.   Depression Screen PHQ 2/9 Scores 09/03/2017 09/12/2016 08/28/2016 09/07/2015  PHQ - 2 Score 0 0 0 0    Cognitive Function     6CIT Screen 09/03/2017 08/28/2016  What Year? 0 points 0 points  What month? 0 points 0 points  What time? 0 points 0 points  Count back from 20 0 points 0 points  Months in reverse 0 points 0 points  Repeat phrase 0 points 0 points  Total Score 0 0    Immunization History  Administered Date(s) Administered  . Influenza, High Dose Seasonal PF 03/07/2016, 03/26/2017  . Influenza,inj,Quad PF,6+ Mos 03/07/2015  . Pneumococcal Conjugate-13 02/28/2014  . Pneumococcal Polysaccharide-23 08/02/2009  . Tdap 07/28/2008  . Zoster  08/14/2010    Qualifies for Shingles Vaccine? Yes, discussed shingrix vaccine   Screening Tests Health Maintenance  Topic Date Due  . Hepatitis C Screening  1946-02-01  . INFLUENZA VACCINE  10/16/2017  . TETANUS/TDAP  07/29/2018  . COLONOSCOPY  04/17/2025   Cancer Screenings: Lung: Low Dose CT Chest recommended if Age 7-80 years, 30 pack-year currently smoking OR have quit w/in 15years. Patient does not qualify. Colorectal: completed 04/18/2015  Additional Screenings:  Hepatitis C Screening:will order for future labs       Plan:    I have personally reviewed and addressed the Medicare Annual Wellness questionnaire and have noted the following in the patient's chart:  A. Medical and social history B. Use of alcohol, tobacco or illicit drugs  C. Current medications and supplements D. Functional ability and status E.  Nutritional status F.  Physical activity G. Advance directives H. List of other physicians I.  Hospitalizations, surgeries, and ER visits in previous 12 months J.  [place] such as hearing and vision if needed, cognitive and depression L. Referrals and appointments   In addition, I have reviewed and discussed with patient certain preventive protocols, quality metrics, and best practice recommendations. A written personalized care plan for preventive services as well as general preventive health recommendations were provided to patient.   Signed,  Tyler Aas, LPN Nurse Health Advisor   Nurse Notes:none

## 2017-09-11 DIAGNOSIS — G4733 Obstructive sleep apnea (adult) (pediatric): Secondary | ICD-10-CM | POA: Diagnosis not present

## 2017-09-24 ENCOUNTER — Ambulatory Visit (INDEPENDENT_AMBULATORY_CARE_PROVIDER_SITE_OTHER): Payer: PPO | Admitting: Family Medicine

## 2017-09-24 ENCOUNTER — Encounter: Payer: Self-pay | Admitting: Family Medicine

## 2017-09-24 VITALS — BP 157/94 | HR 60 | Ht 72.0 in | Wt 233.0 lb

## 2017-09-24 DIAGNOSIS — I481 Persistent atrial fibrillation: Secondary | ICD-10-CM

## 2017-09-24 DIAGNOSIS — Z125 Encounter for screening for malignant neoplasm of prostate: Secondary | ICD-10-CM | POA: Diagnosis not present

## 2017-09-24 DIAGNOSIS — I10 Essential (primary) hypertension: Secondary | ICD-10-CM

## 2017-09-24 DIAGNOSIS — Z131 Encounter for screening for diabetes mellitus: Secondary | ICD-10-CM | POA: Diagnosis not present

## 2017-09-24 DIAGNOSIS — I4819 Other persistent atrial fibrillation: Secondary | ICD-10-CM

## 2017-09-24 DIAGNOSIS — Z0001 Encounter for general adult medical examination with abnormal findings: Secondary | ICD-10-CM

## 2017-09-24 DIAGNOSIS — G4733 Obstructive sleep apnea (adult) (pediatric): Secondary | ICD-10-CM

## 2017-09-24 DIAGNOSIS — E039 Hypothyroidism, unspecified: Secondary | ICD-10-CM | POA: Diagnosis not present

## 2017-09-24 DIAGNOSIS — H6123 Impacted cerumen, bilateral: Secondary | ICD-10-CM

## 2017-09-24 DIAGNOSIS — C61 Malignant neoplasm of prostate: Secondary | ICD-10-CM | POA: Diagnosis not present

## 2017-09-24 DIAGNOSIS — Z7189 Other specified counseling: Secondary | ICD-10-CM

## 2017-09-24 DIAGNOSIS — E876 Hypokalemia: Secondary | ICD-10-CM

## 2017-09-24 DIAGNOSIS — Z1159 Encounter for screening for other viral diseases: Secondary | ICD-10-CM

## 2017-09-24 LAB — MICROSCOPIC EXAMINATION: Bacteria, UA: NONE SEEN

## 2017-09-24 LAB — URINALYSIS, ROUTINE W REFLEX MICROSCOPIC
Bilirubin, UA: NEGATIVE
GLUCOSE, UA: NEGATIVE
KETONES UA: NEGATIVE
Leukocytes, UA: NEGATIVE
NITRITE UA: NEGATIVE
RBC, UA: NEGATIVE
SPEC GRAV UA: 1.02 (ref 1.005–1.030)
UUROB: 0.2 mg/dL (ref 0.2–1.0)
pH, UA: 6 (ref 5.0–7.5)

## 2017-09-24 NOTE — Assessment & Plan Note (Signed)
Intermittent use of CPAP

## 2017-09-24 NOTE — Assessment & Plan Note (Signed)
The current medical regimen is effective;  continue present plan and medications.  

## 2017-09-24 NOTE — Progress Notes (Signed)
BP (!) 157/94   Pulse 60   Ht 6' (1.829 m)   Wt 233 lb (105.7 kg)   SpO2 98%   BMI 31.60 kg/m    Subjective:    Patient ID: [name] [name], male    DOB: 1945-07-20, 72 y.o.   MRN: [id number]  HPI: [name] TOBER is a 72 y.o. male  Chief Complaint  Patient presents with  . Annual Exam   Patient all in all doing well concerned the ears may be intermittently stopped up from wax wants to have that checked. Also some left foot bottom of his foot near the plantar fascia but lateral foot pain discomfort especially after playing racquetball but had played baseball with his grandson for several hours and is loafers which may have triggered this discomfort. Patient also with some intermittent problems with CPAP does have a follow-up visit coming up has trouble with stopped up nose reviewed various care and treatments. Blood pressure home checking does well along with thyroid.  Followed by cardiologist for atrial fibrillation taking Eliquis and amiodarone are done without problems.  Relevant past medical, surgical, family and social history reviewed and updated as indicated. Interim medical history since our last visit reviewed. Allergies and medications reviewed and updated.  Review of Systems  Constitutional: Negative.   HENT: Negative.   Eyes: Negative.   Respiratory: Negative.   Cardiovascular: Negative.   Gastrointestinal: Negative.   Endocrine: Negative.   Genitourinary: Negative.   Musculoskeletal: Negative.   Skin: Negative.   Allergic/Immunologic: Negative.   Neurological: Negative.   Hematological: Negative.   Psychiatric/Behavioral: Negative.     Per HPI unless specifically indicated above     Objective:    BP (!) 157/94   Pulse 60   Ht 6' (1.829 m)   Wt 233 lb (105.7 kg)   SpO2 98%   BMI 31.60 kg/m   Wt Readings from Last 3 Encounters:  09/24/17 233 lb (105.7 kg)  09/03/17 239 lb 4.8 oz (108.5 kg)  06/24/17 241 lb 8 oz (109.5 kg)    Physical Exam    Constitutional: He is oriented to person, place, and time. He appears well-developed and well-nourished.  HENT:  Head: Normocephalic and atraumatic.  Right Ear: External ear normal.  Left Ear: External ear normal.  Eyes: Pupils are equal, round, and reactive to light. Conjunctivae and EOM are normal.  Neck: Normal range of motion. Neck supple.  Cardiovascular: Normal rate, regular rhythm, normal heart sounds and intact distal pulses.  Pulmonary/Chest: Effort normal and breath sounds normal.  Abdominal: Soft. Bowel sounds are normal. There is no splenomegaly or hepatomegaly.  Genitourinary: Rectum normal and penis normal.  Genitourinary Comments: Prostate not palpated  Musculoskeletal: Normal range of motion.  Neurological: He is alert and oriented to person, place, and time. He has normal reflexes.  Skin: No rash noted. No erythema.  Psychiatric: He has a normal mood and affect. His behavior is normal. Judgment and thought content normal.    Results for orders placed or performed in visit on 94/85/46  Basic metabolic panel  Result Value Ref Range   Glucose 92 65 - 99 mg/dL   BUN 15 8 - 27 mg/dL   Creatinine, Ser 1.13 0.76 - 1.27 mg/dL   GFR calc non Af Amer 65 >59 mL/min/1.73   GFR calc Af Amer 75 >59 mL/min/1.73   BUN/Creatinine Ratio 13 10 - 24   Sodium 145 (H) 134 - 144 mmol/L   Potassium 4.6 3.5 - 5.2  mmol/L   Chloride 108 (H) 96 - 106 mmol/L   CO2 22 20 - 29 mmol/L   Calcium 9.1 8.6 - 10.2 mg/dL  CBC with Differential/Platelet  Result Value Ref Range   WBC 4.8 3.4 - 10.8 x10E3/uL   RBC 5.06 4.14 - 5.80 x10E6/uL   Hemoglobin 15.6 13.0 - 17.7 g/dL   Hematocrit 47.5 37.5 - 51.0 %   MCV 94 79 - 97 fL   MCH 30.8 26.6 - 33.0 pg   MCHC 32.8 31.5 - 35.7 g/dL   RDW 14.2 12.3 - 15.4 %   Platelets 185 150 - 379 x10E3/uL   Neutrophils 43 Not Estab. %   Lymphs 37 Not Estab. %   Monocytes 17 Not Estab. %   Eos 2 Not Estab. %   Basos 1 Not Estab. %   Neutrophils Absolute 2.1  1.4 - 7.0 x10E3/uL   Lymphocytes Absolute 1.8 0.7 - 3.1 x10E3/uL   Monocytes Absolute 0.8 0.1 - 0.9 x10E3/uL   EOS (ABSOLUTE) 0.1 0.0 - 0.4 x10E3/uL   Basophils Absolute 0.0 0.0 - 0.2 x10E3/uL   Immature Granulocytes 0 Not Estab. %   Immature Grans (Abs) 0.0 0.0 - 0.1 x10E3/uL      Assessment & Plan:   Problem List Items Addressed This Visit      Cardiovascular and Mediastinum   Essential hypertension - Primary (Chronic)    Discussed blood pressure elevated here in the office but at home good readings we will continue current medications at current dose.      Relevant Orders   CBC with Differential/Platelet   Lipid panel   Comprehensive metabolic panel   Urinalysis, Routine w reflex microscopic   Persistent atrial fibrillation ([place])    Followed by cardiology and stable no issues with Eliquis bleeding bruising.        Respiratory   OSA (obstructive sleep apnea)    Intermittent use of CPAP        Endocrine   Hypothyroidism (Chronic)    The current medical regimen is effective;  continue present plan and medications.       Relevant Orders   TSH     Genitourinary   RESOLVED: Malignant neoplasm of prostate ([place])     Other   Hypokalemia   Relevant Orders   Comprehensive metabolic panel   Advanced care planning/counseling discussion    A voluntary discussion about advanced care planning including explanation and discussion of advanced directives was extentively discussed with the patient.  Explained about the healthcare proxy and living will was reviewed and packet with forms with expiration of how to fill them out was given.  Time spent: Encounter 16+ min individuals present: Patient       Other Visit Diagnoses    Prostate cancer screening       Relevant Orders   PSA   Screening for diabetes mellitus (DM)       Relevant Orders   CBC with Differential/Platelet   Lipid panel   Comprehensive metabolic panel   Urinalysis, Routine w reflex microscopic   Encounter  for hepatitis C screening test for low risk patient       Hearing loss of both ears due to cerumen impaction        Ears wax removed both ears with water and instruments revealing normal canal and TM patient tolerated the procedure well.  Follow up plan: Return in about 6 months (around 03/27/2018) for BMP, CBC.

## 2017-09-24 NOTE — Assessment & Plan Note (Signed)
A voluntary discussion about advanced care planning including explanation and discussion of advanced directives was extentively discussed with the patient.  Explained about the healthcare proxy and living will was reviewed and packet with forms with expiration of how to fill them out was given.  Time spent: Encounter 16+ min individuals present: Patient 

## 2017-09-24 NOTE — Assessment & Plan Note (Signed)
Discussed blood pressure elevated here in the office but at home good readings we will continue current medications at current dose.

## 2017-09-24 NOTE — Assessment & Plan Note (Signed)
Followed by cardiology and stable no issues with Eliquis bleeding bruising.

## 2017-09-25 LAB — LIPID PANEL
CHOL/HDL RATIO: 2.7 ratio (ref 0.0–5.0)
CHOLESTEROL TOTAL: 138 mg/dL (ref 100–199)
HDL: 52 mg/dL (ref >39)
LDL CALC: 79 mg/dL (ref 0–99)
Triglycerides: 35 mg/dL (ref 0–149)
VLDL Cholesterol Cal: 7 mg/dL (ref 5–40)

## 2017-09-25 LAB — COMPREHENSIVE METABOLIC PANEL
ALT: 25 IU/L (ref 0–44)
AST: 22 IU/L (ref 0–40)
Albumin/Globulin Ratio: 2.9 — ABNORMAL HIGH (ref 1.2–2.2)
Albumin: 4.3 g/dL (ref 3.5–4.8)
Alkaline Phosphatase: 93 IU/L (ref 39–117)
BUN/Creatinine Ratio: 14 (ref 10–24)
BUN: 18 mg/dL (ref 8–27)
Bilirubin Total: 0.7 mg/dL (ref 0.0–1.2)
CALCIUM: 9.1 mg/dL (ref 8.6–10.2)
CO2: 24 mmol/L (ref 20–29)
CREATININE: 1.25 mg/dL (ref 0.76–1.27)
Chloride: 107 mmol/L — ABNORMAL HIGH (ref 96–106)
GFR calc non Af Amer: 57 mL/min/{1.73_m2} — ABNORMAL LOW (ref >59)
GFR, EST AFRICAN AMERICAN: 66 mL/min/{1.73_m2} (ref >59)
Globulin, Total: 1.5 g/dL (ref 1.5–4.5)
Glucose: 100 mg/dL — ABNORMAL HIGH (ref 65–99)
POTASSIUM: 4.4 mmol/L (ref 3.5–5.2)
Sodium: 147 mmol/L — ABNORMAL HIGH (ref 134–144)
TOTAL PROTEIN: 5.8 g/dL — AB (ref 6.0–8.5)

## 2017-09-25 LAB — CBC WITH DIFFERENTIAL/PLATELET
BASOS ABS: 0 10*3/uL (ref 0.0–0.2)
Basos: 1 %
EOS (ABSOLUTE): 0.1 10*3/uL (ref 0.0–0.4)
Eos: 2 %
HEMATOCRIT: 44.8 % (ref 37.5–51.0)
HEMOGLOBIN: 14.7 g/dL (ref 13.0–17.7)
Immature Grans (Abs): 0 10*3/uL (ref 0.0–0.1)
Immature Granulocytes: 0 %
LYMPHS ABS: 1.5 10*3/uL (ref 0.7–3.1)
Lymphs: 35 %
MCH: 31.5 pg (ref 26.6–33.0)
MCHC: 32.8 g/dL (ref 31.5–35.7)
MCV: 96 fL (ref 79–97)
MONOS ABS: 0.6 10*3/uL (ref 0.1–0.9)
Monocytes: 14 %
NEUTROS ABS: 2 10*3/uL (ref 1.4–7.0)
Neutrophils: 48 %
Platelets: 150 10*3/uL (ref 150–450)
RBC: 4.66 x10E6/uL (ref 4.14–5.80)
RDW: 14.6 % (ref 12.3–15.4)
WBC: 4.2 10*3/uL (ref 3.4–10.8)

## 2017-09-25 LAB — TSH: TSH: 2.95 u[IU]/mL (ref 0.450–4.500)

## 2017-09-25 LAB — PSA: Prostate Specific Ag, Serum: <0.1 ng/mL (ref 0.0–4.0)

## 2017-09-25 LAB — HEPATITIS C ANTIBODY: Hep C Virus Ab: <0.1 s/co ratio (ref 0.0–0.9)

## 2017-09-30 ENCOUNTER — Telehealth: Payer: Self-pay | Admitting: Family Medicine

## 2017-09-30 DIAGNOSIS — E870 Hyperosmolality and hypernatremia: Secondary | ICD-10-CM

## 2017-09-30 NOTE — Telephone Encounter (Signed)
Phone call Discussed with patient elevated blood salts in particular sodium and chloride Patient's urine slightly contracted patient working out in the sun and sweating a lot. Discussed adequate hydration Repeat BMP couple of weeks.

## 2017-09-30 NOTE — Telephone Encounter (Signed)
-----   Message from Amada Kingfisher, [place] sent at 09/30/2017 12:14 PM EDT ----- Patient was transferred to provider for telephone conversation.

## 2017-10-03 DIAGNOSIS — L570 Actinic keratosis: Secondary | ICD-10-CM | POA: Diagnosis not present

## 2017-10-03 DIAGNOSIS — L821 Other seborrheic keratosis: Secondary | ICD-10-CM | POA: Diagnosis not present

## 2017-10-03 DIAGNOSIS — X32XXXA Exposure to sunlight, initial encounter: Secondary | ICD-10-CM | POA: Diagnosis not present

## 2017-10-03 DIAGNOSIS — Z08 Encounter for follow-up examination after completed treatment for malignant neoplasm: Secondary | ICD-10-CM | POA: Diagnosis not present

## 2017-10-03 DIAGNOSIS — Z85828 Personal history of other malignant neoplasm of skin: Secondary | ICD-10-CM | POA: Diagnosis not present

## 2017-10-08 DIAGNOSIS — G4733 Obstructive sleep apnea (adult) (pediatric): Secondary | ICD-10-CM | POA: Diagnosis not present

## 2017-10-13 ENCOUNTER — Other Ambulatory Visit: Payer: PPO

## 2017-10-13 DIAGNOSIS — E870 Hyperosmolality and hypernatremia: Secondary | ICD-10-CM

## 2017-10-14 ENCOUNTER — Telehealth: Payer: Self-pay | Admitting: Family Medicine

## 2017-10-14 DIAGNOSIS — N289 Disorder of kidney and ureter, unspecified: Secondary | ICD-10-CM

## 2017-10-14 LAB — BASIC METABOLIC PANEL
BUN/Creatinine Ratio: 14 (ref 10–24)
BUN: 18 mg/dL (ref 8–27)
CO2: 23 mmol/L (ref 20–29)
CREATININE: 1.3 mg/dL — AB (ref 0.76–1.27)
Calcium: 9 mg/dL (ref 8.6–10.2)
Chloride: 106 mmol/L (ref 96–106)
GFR calc Af Amer: 63 mL/min/{1.73_m2} (ref >59)
GFR, EST NON AFRICAN AMERICAN: 55 mL/min/{1.73_m2} — AB (ref >59)
GLUCOSE: 96 mg/dL (ref 65–99)
Potassium: 4.2 mmol/L (ref 3.5–5.2)
SODIUM: 145 mmol/L — AB (ref 134–144)

## 2017-10-14 NOTE — Telephone Encounter (Signed)
Please let him know that his blood work shows he's a little dehydrated. He should just increase some fluids and we'll recheck it in 2 weeks. His sodium is back to normal.

## 2017-10-15 ENCOUNTER — Telehealth: Payer: Self-pay | Admitting: Family Medicine

## 2017-10-15 NOTE — Telephone Encounter (Signed)
See 2nd telephone encounter for lab results.

## 2017-10-15 NOTE — Telephone Encounter (Signed)
Left message on machine for pt to return call to the office. CRM created. Okay for nurse to relay

## 2017-10-15 NOTE — Telephone Encounter (Unsigned)
Copied from [place] [id number]. Topic: General - Other >> Oct 15, 2017 10:38 AM Yvette Rack wrote: Reason for CRM: Pt returned call for lab results. Pt requests call back. Cb# [phone]

## 2017-10-15 NOTE — Telephone Encounter (Signed)
Called in and was given message from Dr. Wynetta Emery regarding lab results.    He is dehydrated.   He needs to drink more fluids.   She wants to recheck his sodium in 2 weeks to see if it has returned to normal.  I scheduled him a lab appt for 10/27/17 at 8:00am.     Charted as a telephone encounter since there was not a result note however Dr. Wynetta Emery had left a message to be read to the pt.

## 2017-10-17 NOTE — Telephone Encounter (Signed)
Message relayed to patient. Verbalized understanding and denied questions.   

## 2017-10-20 ENCOUNTER — Encounter: Payer: Self-pay | Admitting: Family Medicine

## 2017-10-27 ENCOUNTER — Other Ambulatory Visit: Payer: PPO

## 2017-10-27 DIAGNOSIS — N289 Disorder of kidney and ureter, unspecified: Secondary | ICD-10-CM

## 2017-10-28 LAB — BASIC METABOLIC PANEL
BUN/Creatinine Ratio: 12 (ref 10–24)
BUN: 14 mg/dL (ref 8–27)
CALCIUM: 8.7 mg/dL (ref 8.6–10.2)
CO2: 25 mmol/L (ref 20–29)
Chloride: 106 mmol/L (ref 96–106)
Creatinine, Ser: 1.17 mg/dL (ref 0.76–1.27)
GFR calc Af Amer: 72 mL/min/{1.73_m2} (ref >59)
GFR, EST NON AFRICAN AMERICAN: 62 mL/min/{1.73_m2} (ref >59)
Glucose: 105 mg/dL — ABNORMAL HIGH (ref 65–99)
POTASSIUM: 4.6 mmol/L (ref 3.5–5.2)
Sodium: 144 mmol/L (ref 134–144)

## 2017-11-09 IMAGING — CR DG CHEST 1V PORT
1 series · 1 of 1 positions shown · non-contrast
Comparison: None.

CLINICAL DATA: Dyspnea, new onset of atrial fibrillation,
palpitations

EXAM:
PORTABLE CHEST 1 VIEW

[portable]
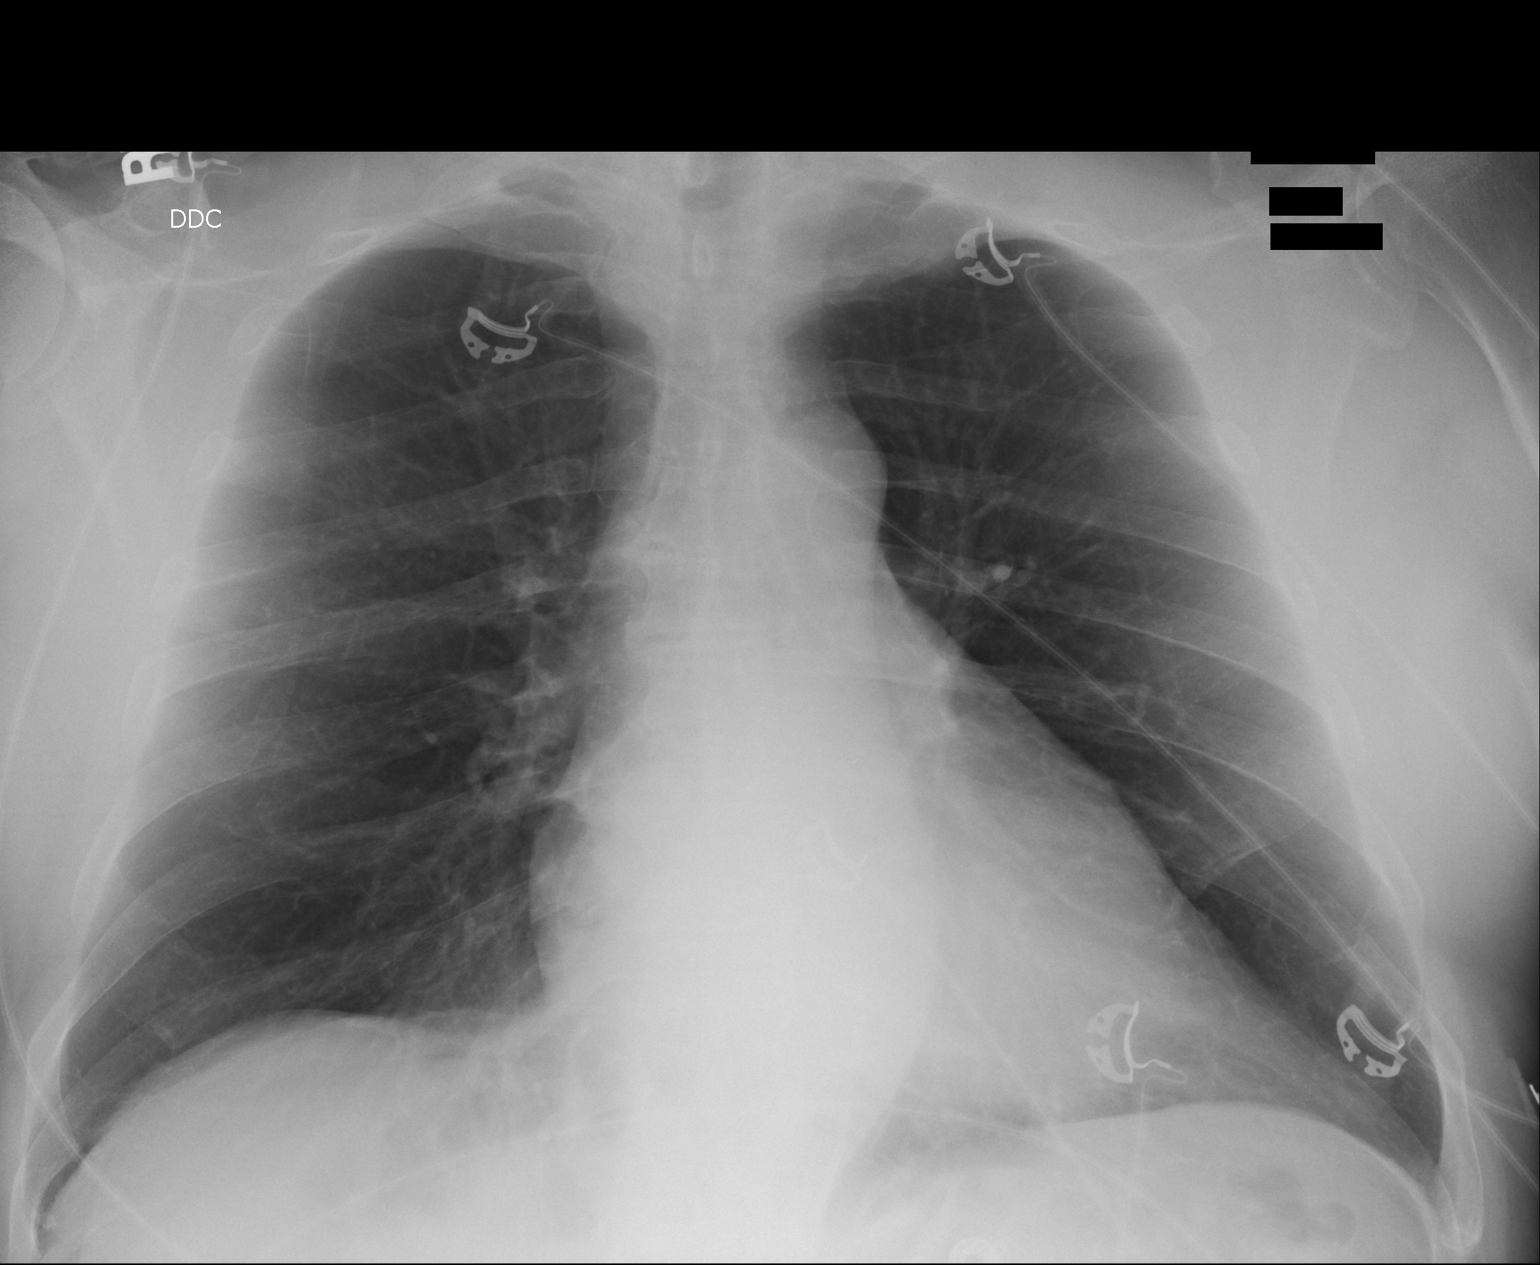

[1 of 1 positions shown; findings below may reference images not displayed]

FINDINGS: Borderline cardiomegaly. No acute infiltrate or pleural effusion. No
pulmonary edema. Mild degenerative changes thoracic spine.
IMPRESSION: No active disease.  Borderline cardiomegaly.

## 2018-03-31 ENCOUNTER — Ambulatory Visit (INDEPENDENT_AMBULATORY_CARE_PROVIDER_SITE_OTHER): Payer: PPO | Admitting: Family Medicine

## 2018-03-31 ENCOUNTER — Encounter: Payer: Self-pay | Admitting: Family Medicine

## 2018-03-31 VITALS — BP 170/99 | HR 64 | Temp 98.1°F | Wt 242.4 lb

## 2018-03-31 DIAGNOSIS — I4891 Unspecified atrial fibrillation: Secondary | ICD-10-CM

## 2018-03-31 DIAGNOSIS — E039 Hypothyroidism, unspecified: Secondary | ICD-10-CM

## 2018-03-31 DIAGNOSIS — G4733 Obstructive sleep apnea (adult) (pediatric): Secondary | ICD-10-CM | POA: Diagnosis not present

## 2018-03-31 DIAGNOSIS — Z23 Encounter for immunization: Secondary | ICD-10-CM | POA: Diagnosis not present

## 2018-03-31 DIAGNOSIS — I10 Essential (primary) hypertension: Secondary | ICD-10-CM | POA: Diagnosis not present

## 2018-03-31 MED ORDER — LEVOTHYROXINE SODIUM 50 MCG PO TABS: 50.0000 ug | ORAL_TABLET | Freq: Every day | ORAL | 2 refills | Status: DC

## 2018-03-31 NOTE — Assessment & Plan Note (Signed)
The current medical regimen is effective;  continue present plan and medications.  

## 2018-03-31 NOTE — Assessment & Plan Note (Signed)
Uses CPAP with relief of daytime drowsiness and faithful use.

## 2018-03-31 NOTE — Patient Instructions (Signed)
Influenza (Flu) Vaccine (Inactivated or Recombinant): What You Need to Know  1. Why get vaccinated?  Influenza vaccine can prevent influenza (flu).  Flu is a contagious disease that spreads around the [place] every year, usually between October and May. Anyone can get the flu, but it is more dangerous for some people. Infants and young children, people 74 years of age and older, pregnant women, and people with certain health conditions or a weakened immune system are at greatest risk of flu complications.  Pneumonia, bronchitis, sinus infections and ear infections are examples of flu-related complications. If you have a medical condition, such as heart disease, cancer or diabetes, flu can make it worse.  Flu can cause fever and chills, sore throat, muscle aches, fatigue, cough, headache, and runny or stuffy nose. Some people may have vomiting and diarrhea, though this is more common in children than adults.  Each year thousands of people in the [place] die from flu, and many more are hospitalized. Flu vaccine prevents millions of illnesses and flu-related visits to the doctor each year.  2. Influenza vaccine  CDC recommends everyone 6 months of age and older get vaccinated every flu season. Children 6 months through 8 years of age may need 2 doses during a single flu season. Everyone else needs only 1 dose each flu season.  It takes about 2 weeks for protection to develop after vaccination.  There are many flu viruses, and they are always changing. Each year a new flu vaccine is made to protect against three or four viruses that are likely to cause disease in the upcoming flu season. Even when the vaccine doesn't exactly match these viruses, it may still provide some protection.  Influenza vaccine does not cause flu.  Influenza vaccine may be given at the same time as other vaccines.  3. Talk with your health care provider  Tell your vaccine provider if the person getting the vaccine:  · Has had an  allergic reaction after a previous dose of influenza vaccine, or has any severe, life-threatening allergies.  · Has ever had Guillain-Barré Syndrome (also called GBS).  In some cases, your health care provider may decide to postpone influenza vaccination to a future visit.  People with minor illnesses, such as a cold, may be vaccinated. People who are moderately or severely ill should usually wait until they recover before getting influenza vaccine.  Your health care provider can give you more information.  4. Risks of a vaccine reaction  · Soreness, redness, and swelling where shot is given, fever, muscle aches, and headache can happen after influenza vaccine.  · There may be a very small increased risk of Guillain-Barré Syndrome (GBS) after inactivated influenza vaccine (the flu shot).  Young children who get the flu shot along with pneumococcal vaccine (PCV13), and/or DTaP vaccine at the same time might be slightly more likely to have a seizure caused by fever. Tell your health care provider if a child who is getting flu vaccine has ever had a seizure.  People sometimes faint after medical procedures, including vaccination. Tell your provider if you feel dizzy or have vision changes or ringing in the ears.  As with any medicine, there is a very remote chance of a vaccine causing a severe allergic reaction, other serious injury, or death.  5. What if there is a serious problem?  An allergic reaction could occur after the vaccinated person leaves the clinic. If you see signs of a severe allergic reaction (hives, swelling   of the face and throat, difficulty breathing, a fast heartbeat, dizziness, or weakness), call 9-1-1 and get the person to the nearest hospital.  For other signs that concern you, call your health care provider.  Adverse reactions should be reported to the Vaccine Adverse Event Reporting System (VAERS). Your health care provider will usually file this report, or you can do it yourself. Visit the  VAERS website at www.vaers.hhs.gov or call 1-800-822-7967.VAERS is only for reporting reactions, and VAERS staff do not give medical advice.  6. The National Vaccine Injury Compensation Program  The National Vaccine Injury Compensation Program (VICP) is a federal program that was created to compensate people who may have been injured by certain vaccines. Visit the VICP website at www.hrsa.gov/vaccinecompensation or call 1-800-338-2382 to learn about the program and about filing a claim. There is a time limit to file a claim for compensation.  7. How can I learn more?  · Ask your healthcare provider.  · Call your local or state health department.  · Contact the Centers for Disease Control and Prevention (CDC):  ? Call 1-800-232-4636 (1-800-CDC-INFO) or  ? Visit CDC's www.cdc.gov/flu  Vaccine Information Statement (Interim) Inactivated Influenza Vaccine (10/30/2017)  This information is not intended to replace advice given to you by your health care provider. Make sure you discuss any questions you have with your health care provider.  Document Released: 12/27/2005 Document Revised: 11/03/2017 Document Reviewed: 11/03/2017  Elsevier Interactive Patient Education © 2019 [employer]

## 2018-03-31 NOTE — Progress Notes (Signed)
BP (!) 170/99 (BP Location: Left Arm, Cuff Size: Normal)   Pulse 64   Temp 98.1 F (36.7 C) (Oral)   Wt 242 lb 6.4 oz (110 kg)   SpO2 98%   BMI 32.88 kg/m    Subjective:    Patient ID: [name] [name], male    DOB: December 21, 1945, 74 y.o.   MRN: [id number]  HPI: [name] [name] is a 73 y.o. male  Chief Complaint  Patient presents with  . Hypertension  Patient follow-up hypertension doing well taking metoprolol 50 without any problems.  Also taking other medications Eliquis without bleeding or bruising issues taking amiodarone are done with no noticed atrial fibrillation.  Also taking thyroid medications without problems. 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 Patient's had trouble with dietary indiscretion this winter.  Relevant past medical, surgical, family and social history reviewed and updated as indicated. Interim medical history since our last visit reviewed. Allergies and medications reviewed and updated.  Review of Systems  Constitutional: Negative.   Respiratory: Negative.   Cardiovascular: Negative.     Per HPI unless specifically indicated above     Objective:    BP (!) 170/99 (BP Location: Left Arm, Cuff Size: Normal)   Pulse 64   Temp 98.1 F (36.7 C) (Oral)   Wt 242 lb 6.4 oz (110 kg)   SpO2 98%   BMI 32.88 kg/m   Wt Readings from Last 3 Encounters:  03/31/18 242 lb 6.4 oz (110 kg)  09/24/17 233 lb (105.7 kg)  09/03/17 239 lb 4.8 oz (108.5 kg)    Physical Exam Constitutional:      Appearance: He is well-developed.  HENT:     Head: Normocephalic and atraumatic.  Eyes:     Conjunctiva/sclera: Conjunctivae normal.  Neck:     Musculoskeletal: Normal range of motion.  Cardiovascular:     Rate and Rhythm: Normal rate and regular rhythm.     Heart sounds: Normal heart sounds.  Pulmonary:     Effort: Pulmonary effort is normal.     Breath sounds:  Normal breath sounds.  Musculoskeletal: Normal range of motion.  Skin:    Findings: No erythema.  Neurological:     Mental Status: He is alert and oriented to person, place, and time.  Psychiatric:        Behavior: Behavior normal.        Thought Content: Thought content normal.        Judgment: Judgment normal.     Results for orders placed or performed in visit on 93/26/71  Basic metabolic panel  Result Value Ref Range   Glucose 105 (H) 65 - 99 mg/dL   BUN 14 8 - 27 mg/dL   Creatinine, Ser 1.17 0.76 - 1.27 mg/dL   GFR calc non Af Amer 62 >59 mL/min/1.73   GFR calc Af Amer 72 >59 mL/min/1.73   BUN/Creatinine Ratio 12 10 - 24   Sodium 144 134 - 144 mmol/L   Potassium 4.6 3.5 - 5.2 mmol/L   Chloride 106 96 - 106 mmol/L   CO2 25 20 - 29 mmol/L   Calcium 8.7 8.6 - 10.2 mg/dL      Assessment & Plan:   Problem List Items Addressed This Visit      Cardiovascular and Mediastinum   Essential hypertension - Primary (Chronic)    Poor control of hypertension with problems of hypotension in the past  with increased medication.  Patient's hypertension most likely secondary to dietary indiscretion patient will do better with exercise nutrition weight loss. Patient home monitoring of blood pressure indicates good control but here in the office markedly elevated.  Patient has follow-up with cardiology later this spring.  We will also keep up with his blood pressure and if not coming down will notify us. Check BMP      Relevant Orders   CBC with Differential/Platelet   Basic metabolic panel   Atrial fibrillation with RVR ([facility])    The current medical regimen is effective;  continue present plan and medications. Check CBC        Respiratory   OSA (obstructive sleep apnea)    Uses CPAP with relief of daytime drowsiness and faithful use.        Endocrine   Hypothyroidism (Chronic)    The current medical regimen is effective;  continue present plan and medications.        Relevant Medications   levothyroxine (SYNTHROID, LEVOTHROID) 50 MCG tablet    Other Visit Diagnoses    Need for influenza vaccination       Relevant Orders   Flu vaccine HIGH DOSE PF (Completed)       Follow up plan: Return in about 6 months (around 09/29/2018) for Physical Exam.

## 2018-03-31 NOTE — Assessment & Plan Note (Addendum)
Poor control of hypertension with problems of hypotension in the past with increased medication.  Patient's hypertension most likely secondary to dietary indiscretion patient will do better with exercise nutrition weight loss. Patient home monitoring of blood pressure indicates good control but here in the office markedly elevated.  Patient has follow-up with cardiology later this spring.  We will also keep up with his blood pressure and if not coming down will notify us. Check BMP

## 2018-03-31 NOTE — Assessment & Plan Note (Addendum)
The current medical regimen is effective;  continue present plan and medications. Check CBC

## 2018-04-01 ENCOUNTER — Encounter: Payer: Self-pay | Admitting: Family Medicine

## 2018-04-01 LAB — BASIC METABOLIC PANEL
BUN / CREAT RATIO: 13 (ref 10–24)
BUN: 16 mg/dL (ref 8–27)
CO2: 24 mmol/L (ref 20–29)
CREATININE: 1.25 mg/dL (ref 0.76–1.27)
Calcium: 9 mg/dL (ref 8.6–10.2)
Chloride: 103 mmol/L (ref 96–106)
GFR calc Af Amer: 66 mL/min/{1.73_m2} (ref >59)
GFR calc non Af Amer: 57 mL/min/{1.73_m2} — ABNORMAL LOW (ref >59)
GLUCOSE: 103 mg/dL — AB (ref 65–99)
POTASSIUM: 4.2 mmol/L (ref 3.5–5.2)
SODIUM: 142 mmol/L (ref 134–144)

## 2018-04-01 LAB — CBC WITH DIFFERENTIAL/PLATELET
BASOS ABS: 0.1 10*3/uL (ref 0.0–0.2)
Basos: 1 %
EOS (ABSOLUTE): 0.1 10*3/uL (ref 0.0–0.4)
Eos: 2 %
HEMOGLOBIN: 15.5 g/dL (ref 13.0–17.7)
Hematocrit: 47.4 % (ref 37.5–51.0)
Immature Grans (Abs): 0 10*3/uL (ref 0.0–0.1)
Immature Granulocytes: 1 %
LYMPHS ABS: 1.4 10*3/uL (ref 0.7–3.1)
Lymphs: 30 %
MCH: 30.5 pg (ref 26.6–33.0)
MCHC: 32.7 g/dL (ref 31.5–35.7)
MCV: 93 fL (ref 79–97)
Monocytes Absolute: 0.7 10*3/uL (ref 0.1–0.9)
Monocytes: 15 %
NEUTROS ABS: 2.3 10*3/uL (ref 1.4–7.0)
Neutrophils: 51 %
Platelets: 137 10*3/uL — ABNORMAL LOW (ref 150–450)
RBC: 5.09 x10E6/uL (ref 4.14–5.80)
RDW: 13.3 % (ref 11.6–15.4)
WBC: 4.5 10*3/uL (ref 3.4–10.8)

## 2018-04-07 DIAGNOSIS — D485 Neoplasm of uncertain behavior of skin: Secondary | ICD-10-CM | POA: Diagnosis not present

## 2018-04-07 DIAGNOSIS — L570 Actinic keratosis: Secondary | ICD-10-CM | POA: Diagnosis not present

## 2018-04-07 DIAGNOSIS — Z85828 Personal history of other malignant neoplasm of skin: Secondary | ICD-10-CM | POA: Diagnosis not present

## 2018-04-07 DIAGNOSIS — X32XXXA Exposure to sunlight, initial encounter: Secondary | ICD-10-CM | POA: Diagnosis not present

## 2018-04-07 DIAGNOSIS — Z08 Encounter for follow-up examination after completed treatment for malignant neoplasm: Secondary | ICD-10-CM | POA: Diagnosis not present

## 2018-04-07 DIAGNOSIS — C4441 Basal cell carcinoma of skin of scalp and neck: Secondary | ICD-10-CM | POA: Diagnosis not present

## 2018-04-07 DIAGNOSIS — B078 Other viral warts: Secondary | ICD-10-CM | POA: Diagnosis not present

## 2018-04-28 DIAGNOSIS — C4441 Basal cell carcinoma of skin of scalp and neck: Secondary | ICD-10-CM | POA: Diagnosis not present

## 2018-06-15 ENCOUNTER — Telehealth: Payer: Self-pay

## 2018-06-15 NOTE — Telephone Encounter (Signed)
LMOV for patient to call back.  Appointment needs to be changed to EVISIT

## 2018-06-15 NOTE — Telephone Encounter (Signed)
Virtual Visit Pre-Appointment Phone Call  Steps For Call:  1. Confirm consent - "In the setting of the current Covid19 crisis, you are scheduled for a TELEPHONE visit with your provider on 06/25/2018 at 2:00PM.  Just as we do with many in-office visits, in order for you to participate in this visit, we must obtain consent.  If you'd like, I can send this to your mychart (if signed up) or email for you to review.  Otherwise, I can obtain your verbal consent now.  All virtual visits are billed to your insurance company just like a normal visit would be.  By agreeing to a virtual visit, we'd like you to understand that the technology does not allow for your provider to perform an examination, and thus may limit your provider's ability to fully assess your condition.  Finally, though the technology is pretty good, we cannot assure that it will always work on either your or our end, and in the setting of a video visit, we may have to convert it to a phone-only visit.  In either situation, we cannot ensure that we have a secure connection.  Are you willing to proceed?"  2. Give patient instructions for WebEx download to smartphone as below if video visit  3. Advise patient to be prepared with any vital sign or heart rhythm information, their current medicines, and a piece of paper and pen handy for any instructions they may receive the day of their visit  4. Inform patient they will receive a phone call 15 minutes prior to their appointment time (may be from unknown caller ID) so they should be prepared to answer  5. Confirm that appointment type is correct in Epic appointment notes (video vs telephone)    TELEPHONE CALL NOTE  [name] [name] has been deemed a candidate for a follow-up tele-health visit to limit community exposure during the Covid-19 pandemic. I spoke with the patient via phone to ensure availability of phone/video source, confirm preferred email & phone number, and discuss  instructions and expectations.  I reminded [name] [name] to be prepared with any vital sign and/or heart rhythm information that could potentially be obtained via home monitoring, at the time of his visit. I reminded [name] [name] to expect a phone call at the time of his visit if his visit.  Did the patient verbally acknowledge consent to treatment? [name], [place] 06/15/2018 2:48 PM   CONSENT FOR TELE-HEALTH VISIT - PLEASE REVIEW  I hereby voluntarily request, consent and authorize [facility] and its employed or contracted physicians, physician assistants, nurse practitioners or other licensed health care professionals (the Practitioner), to provide me with telemedicine health care services (the Services") as deemed necessary by the treating Practitioner. I acknowledge and consent to receive the Services by the Practitioner via telemedicine. I understand that the telemedicine visit will involve communicating with the Practitioner through live audiovisual communication technology and the disclosure of certain medical information by electronic transmission. I acknowledge that I have been given the opportunity to request an in-person assessment or other available alternative prior to the telemedicine visit and am voluntarily participating in the telemedicine visit.  I understand that I have the right to withhold or withdraw my consent to the use of telemedicine in the course of my care at any time, without affecting my right to future care or treatment, and that the Practitioner or I may terminate the telemedicine visit at any time. I understand that I  have the right to inspect all information obtained and/or recorded in the course of the telemedicine visit and may receive copies of available information for a reasonable fee.  I understand that some of the potential risks of receiving the Services via telemedicine include:   Delay or interruption in medical evaluation due to  technological equipment failure or disruption;  Information transmitted may not be sufficient (e.g. poor resolution of images) to allow for appropriate medical decision making by the Practitioner; and/or   In rare instances, security protocols could fail, causing a breach of personal health information.  Furthermore, I acknowledge that it is my responsibility to provide information about my medical history, conditions and care that is complete and accurate to the best of my ability. I acknowledge that Practitioner's advice, recommendations, and/or decision may be based on factors not within their control, such as incomplete or inaccurate data provided by me or distortions of diagnostic images or specimens that may result from electronic transmissions. I understand that the practice of medicine is not an exact science and that Practitioner makes no warranties or guarantees regarding treatment outcomes. I acknowledge that I will receive a copy of this consent concurrently upon execution via email to the email address I last provided but may also request a printed copy by calling the office of [place].    I understand that my insurance will be billed for this visit.   I have read or had this consent read to me.  I understand the contents of this consent, which adequately explains the benefits and risks of the Services being provided via telemedicine.   I have been provided ample opportunity to ask questions regarding this consent and the Services and have had my questions answered to my satisfaction.  I give my informed consent for the services to be provided through the use of telemedicine in my medical care  By participating in this telemedicine visit I agree to the above.

## 2018-06-19 ENCOUNTER — Other Ambulatory Visit: Payer: Self-pay | Admitting: Cardiovascular Disease

## 2018-06-25 ENCOUNTER — Other Ambulatory Visit: Payer: Self-pay

## 2018-06-25 ENCOUNTER — Telehealth (INDEPENDENT_AMBULATORY_CARE_PROVIDER_SITE_OTHER): Payer: PPO | Admitting: Cardiovascular Disease

## 2018-06-25 DIAGNOSIS — I10 Essential (primary) hypertension: Secondary | ICD-10-CM | POA: Diagnosis not present

## 2018-06-25 DIAGNOSIS — I4819 Other persistent atrial fibrillation: Secondary | ICD-10-CM

## 2018-06-25 DIAGNOSIS — G4733 Obstructive sleep apnea (adult) (pediatric): Secondary | ICD-10-CM

## 2018-06-25 DIAGNOSIS — Z9989 Dependence on other enabling machines and devices: Secondary | ICD-10-CM

## 2018-06-25 DIAGNOSIS — Z7189 Other specified counseling: Secondary | ICD-10-CM

## 2018-06-25 MED ORDER — AMIODARONE HCL 200 MG PO TABS: 200.0000 mg | ORAL_TABLET | Freq: Every day | ORAL | 3 refills | Status: DC

## 2018-06-25 MED ORDER — METOPROLOL TARTRATE 50 MG PO TABS: 50.0000 mg | ORAL_TABLET | Freq: Two times a day (BID) | ORAL | 3 refills | Status: DC

## 2018-06-25 MED ORDER — APIXABAN 5 MG PO TABS: 5.0000 mg | ORAL_TABLET | Freq: Two times a day (BID) | ORAL | 3 refills | Status: DC

## 2018-06-25 NOTE — Progress Notes (Addendum)
Virtual Visit via Telephone Note   This visit type was conducted due to national recommendations for restrictions regarding the COVID-19 Pandemic (e.g. social distancing) in an effort to limit this patient's exposure and mitigate transmission in our community.  Due to her co-morbid illnesses, this patient is at least at moderate risk for complications without adequate follow up.  This format is felt to be most appropriate for this patient at this time.  The patient did not have access to video technology/had technical difficulties with video requiring transitioning to audio format only (telephone).  All issues noted in this document were discussed and addressed.  No physical exam could be performed with this format.  Please refer to the patient's chart for her  consent to telehealth for [facility].    Date:  06/25/2018   ID:  [name] [name], DOB Feb 20, 1946, MRN [id number]  Patient Location:  [place] [address]   Provider location:   [facility], [place] office  PCP:  Guadalupe Maple, MD  Cardiologist:  Patsy Baltimore  Chief Complaint:  Atrial fib, palp   History of Present Illness:    [name] [name] is a 73 y.o. male who presents via audio/video conferencing for a telehealth visit today.   The patient does not symptoms concerning for COVID-19 infection (fever, chills, cough, or new SHORTNESS OF BREATH).   Patient has a past medical history of Afib with RVR with heart rates in the 180's in the setting of hypokalemia and colonoscopy prep,  HTN,  hypothyroidism,  sleep apnea on cpap prostate cancer, previous cardioversion for Persistent atrial fibrillation Sleep apnea, wears his CPAP No diabetes, goof cholesterol He presents for routine follow-up of his paroxysmal atrial fibrillation  He was asymptomatic when in atrial fibrillation Takes amiodarone as needed for irregular rhythm Last clinic visit was in normal rhythm rate in the  60s No atrial fib  Very active  BP 116-130/70 to 80 Pulse: 60 to 70 resp:16  Denies any significant shortness of breath or chest pain No regular exercise program, good activity level   Prior CV studies:   The following studies were reviewed today:  Previous echocardiogram ECHO EF 50 to 55%, 04/18/2015  Prior clinic visit 05/30/16 he reported being in atrial fibrillation for several days 05/26/16, elevated HR at home on his measurements  flushedrecently, does not feel as well when playing racquetball Wears CPAP, number of apnea events up to 15, previously 5 to 9 overnight  Heart Rate 123 bpm on EKG  compliant on his anticoagulation CHADS2VASc of 2 (HTN and age x 1).  Take eliquis 5 mg twice a day He has been taking metoprolol 50 mg twice a day  We started him on amiodarone 400 bid with taper after 5 days,metoprolol increased Back to NSR on 3/21 Then ran out of amio last few days Was on metoprolol 100 BID temporarily  Was previously on benazepril, stopped for low blood pressure  He did have episode of hematuria May 2017 Had a Cystoscopy. No significant pathology per the patient No more hematuria since then   Previous CT scan ABD showing no CAD, no aorta disease or iliac vessel   He was undergoing a bowel prep for a routine screening colonoscopy which he was at [facility] as an outpatient for on 04/18/15. he was noted to be in new onset Afib with RVR with HR in the 180's  He was completely asymptomatic.  Troponin was negative x 4. TSH 2.07,  free T4 was elevated at 1.43, K+ 3.6, Mg++ 1.7.  Echo showed EF 50-55%, no RWMA, not sufficient to allow for LV diastolic function, mildly dilated left atrium at 46 mm, mild MR, normal RV systolic function, and normal PASP.   He was started on Eliquis given his CHADS2VASc of 2 (HTN and age x 1).   remote smoking history when he was in his 82s and 30s, Total cholesterol 160, ldl 94   Past Medical History:  Diagnosis Date   . A-fib ([place])   . Atrial fibrillation with RVR ([place])   . Dupuytren's contracture   . Essential hypertension 09/05/2014  . Hypertension   . Hypokalemia   . Hypothyroidism   . 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 Mitral regurgitation    a. echo 03/2015: echo 03/2015: EF 50-55%, no RWMA, no sufficienct to allow for LV diastolic fxn, mild MR, LA mildly dilated at 46 mm, RV systolic fxn nl, PASP nl  . Nephrolithiasis   . New onset atrial fibrillation ([place]) 04/18/2015  . OSA (obstructive sleep apnea) 05/11/2015  . Persistent atrial fibrillation    Past Surgical History:  Procedure Laterality Date  . COLONOSCOPY WITH PROPOFOL N/A 04/18/2015   Procedure: COLONOSCOPY WITH PROPOFOL;  Surgeon: Lucilla Lame, MD;  Location: [facility] ENDOSCOPY;  Service: Endoscopy;  Laterality: N/A;  . ELECTROPHYSIOLOGIC STUDY N/A 05/05/2015   Procedure: CARDIOVERSION;  Surgeon: Wellington Hampshire, MD;  Location: [facility] ORS;  Service: Cardiovascular;  Laterality: N/A;  . RADIOACTIVE SEED IMPLANT N/A      No outpatient medications have been marked as taking for the 06/25/18 encounter (Appointment) with Minna Merritts, MD.     Allergies:   Patient has no known allergies.   Social History   Tobacco Use  . Smoking status: Former Smoker    Types: Cigarettes    Last attempt to quit: 09/05/1994    Years since quitting: 23.8  . Smokeless tobacco: Former Systems developer    Types: Chew    Quit date: 09/04/2004  Substance Use Topics  . Alcohol use: No  . Drug use: No     Current Outpatient Medications on File Prior to Visit  Medication Sig Dispense Refill  . amiodarone (PACERONE) 200 MG tablet Take 1 tablet by mouth once daily 90 tablet 0  . apixaban (ELIQUIS) 5 MG TABS tablet Take 1 tablet (5 mg total) by mouth 2 (two) times daily. 180 tablet 3  . levothyroxine (SYNTHROID, LEVOTHROID) 50 MCG tablet Take 1 tablet (50 mcg total) by mouth daily before breakfast. 90 tablet 2  . metoprolol  tartrate (LOPRESSOR) 50 MG tablet Take 1 tablet by mouth twice daily 180 tablet 0  . Multiple Vitamin (MULTIVITAMIN) tablet Take 1 tablet by mouth daily.     No current facility-administered medications on file prior to visit.      Family Hx: The patient's family history includes Heart attack in his mother; Heart disease in his father.  ROS:   Please see the history of present illness.    Review of Systems  Constitutional: Negative.   Respiratory: Negative.   Cardiovascular: Positive for palpitations.  Gastrointestinal: Negative.   Musculoskeletal: Negative.   Neurological: Negative.   Psychiatric/Behavioral: Negative.   All other systems reviewed and are negative.     Labs/Other Tests and Data Reviewed:    Recent Labs: 09/24/2017: ALT 25; TSH 2.950 03/31/2018: BUN 16; Creatinine, Ser 1.25; Hemoglobin 15.5; Platelets 137; Potassium 4.2; Sodium 142  Recent Lipid Panel Lab Results  Component Value Date/Time   CHOL 138 09/24/2017 10:24 AM   TRIG 35 09/24/2017 10:24 AM   HDL 52 09/24/2017 10:24 AM   CHOLHDL 2.7 09/24/2017 10:24 AM   LDLCALC 79 09/24/2017 10:24 AM    Wt Readings from Last 3 Encounters:  03/31/18 242 lb 6.4 oz (110 kg)  09/24/17 233 lb (105.7 kg)  09/03/17 239 lb 4.8 oz (108.5 kg)     Exam:    Vital Signs: Vital signs may also be detailed in the HPI There were no vitals taken for this visit.  Wt Readings from Last 3 Encounters:  03/31/18 242 lb 6.4 oz (110 kg)  09/24/17 233 lb (105.7 kg)  09/03/17 239 lb 4.8 oz (108.5 kg)   Temp Readings from Last 3 Encounters:  03/31/18 98.1 F (36.7 C) (Oral)  09/03/17 97.8 F (36.6 C) (Temporal)  08/28/16 98.1 F (36.7 C)   BP Readings from Last 3 Encounters:  03/31/18 (!) 170/99  09/24/17 (!) 157/94  09/03/17 (!) 162/88   Pulse Readings from Last 3 Encounters:  03/31/18 64  09/24/17 60  09/03/17 61    todays number  BP 116-130/70 to 80 Pulse: 60 to 70 Resp:16  Well nourished, well developed  male in no acute distress. Constitutional:  oriented to person, place, and time. No distress.    ASSESSMENT & PLAN:    Persistent atrial fibrillation Long discussion, in general has been well controlled on amiodarone and metoprolol Compliant with his anticoagulation with no bleeding No changes made  Essential hypertension He will monitor blood pressure at home, slight trend upwards systolic pressures 917 sometimes in the 130 range No medication changes made  OSA (obstructive sleep apnea) Wears CPAP on a regular basis   COVID-19 Education: The signs and symptoms of COVID-19 were discussed with the patient and how to seek care for testing (follow up with PCP or arrange E-visit).  The importance of social distancing was discussed today.  Patient Risk:   After full review of this patients clinical status, I feel that they are at least moderate risk at this time.  Time:   Today, I have spent 25 minutes with the patient with telehealth technology discussing the cardiac and medical problems/diagnoses detailed above  , gust paroxysmal atrial fibrillation, medications, monitoring, anticoagulation discussion   Medication Adjustments/Labs and Tests Ordered: Current medicines are reviewed at length with the patient today.  Concerns regarding medicines are outlined above.   Tests Ordered: No tests ordered   Medication Changes: No changes made   Disposition: Follow-up in 6 months   Signed, Ida Rogue, MD  06/25/2018 2:21 PM    [place] Office [address], [place], [place] [address]

## 2018-06-25 NOTE — Patient Instructions (Addendum)
Medication Instructions:  Continue current medications and refills sent into your pharmacy.  If you need a refill on your cardiac medications before your next appointment, please call your pharmacy.    Lab work: No new labs needed   If you have labs (blood work) drawn today and your tests are completely normal, you will receive your results only by: Marland Kitchen MyChart Message (if you have MyChart) OR . A paper copy in the mail If you have any lab test that is abnormal or we need to change your treatment, we will call you to review the results.   Testing/Procedures: No new testing needed   Follow-Up: At [facility], you and your health needs are our priority.  As part of our continuing mission to provide you with exceptional heart care, we have created designated Provider Care Teams.  These Care Teams include your primary Cardiologist (physician) and Advanced Practice Providers (APPs -  Physician Assistants and Nurse Practitioners) who all work together to provide you with the care you need, when you need it.  . You will need a follow up appointment in 6 months .   Please call our office 2 months in advance to schedule this appointment.    . Providers on your designated Care Team:   . Murray Hodgkins, NP . Christell Faith, PA-C . Marrianne Mood, PA-C  Any Other Special Instructions Will Be Listed Below (If Applicable).  For educational health videos Log in to : [email or web address] Or : [email or web address], password : [email or web address]

## 2018-06-29 ENCOUNTER — Ambulatory Visit: Payer: PPO | Admitting: Cardiovascular Disease

## 2018-07-14 DIAGNOSIS — G4733 Obstructive sleep apnea (adult) (pediatric): Secondary | ICD-10-CM | POA: Diagnosis not present

## 2018-08-27 DIAGNOSIS — D2262 Melanocytic nevi of left upper limb, including shoulder: Secondary | ICD-10-CM | POA: Diagnosis not present

## 2018-08-27 DIAGNOSIS — L570 Actinic keratosis: Secondary | ICD-10-CM | POA: Diagnosis not present

## 2018-08-27 DIAGNOSIS — Z85828 Personal history of other malignant neoplasm of skin: Secondary | ICD-10-CM | POA: Diagnosis not present

## 2018-08-27 DIAGNOSIS — L821 Other seborrheic keratosis: Secondary | ICD-10-CM | POA: Diagnosis not present

## 2018-08-27 DIAGNOSIS — D2272 Melanocytic nevi of left lower limb, including hip: Secondary | ICD-10-CM | POA: Diagnosis not present

## 2018-08-27 DIAGNOSIS — X32XXXA Exposure to sunlight, initial encounter: Secondary | ICD-10-CM | POA: Diagnosis not present

## 2018-08-27 DIAGNOSIS — D2261 Melanocytic nevi of right upper limb, including shoulder: Secondary | ICD-10-CM | POA: Diagnosis not present

## 2018-09-28 ENCOUNTER — Other Ambulatory Visit: Payer: Self-pay

## 2018-09-28 ENCOUNTER — Ambulatory Visit (INDEPENDENT_AMBULATORY_CARE_PROVIDER_SITE_OTHER): Payer: PPO

## 2018-09-28 VITALS — BP 134/72 | HR 53 | Temp 98.0°F | Resp 16 | Ht 73.0 in | Wt 218.0 lb

## 2018-09-28 DIAGNOSIS — E039 Hypothyroidism, unspecified: Secondary | ICD-10-CM

## 2018-09-28 DIAGNOSIS — E876 Hypokalemia: Secondary | ICD-10-CM | POA: Diagnosis not present

## 2018-09-28 DIAGNOSIS — C61 Malignant neoplasm of prostate: Secondary | ICD-10-CM

## 2018-09-28 DIAGNOSIS — Z0000 Encounter for general adult medical examination without abnormal findings: Secondary | ICD-10-CM

## 2018-09-28 DIAGNOSIS — N289 Disorder of kidney and ureter, unspecified: Secondary | ICD-10-CM

## 2018-09-28 DIAGNOSIS — I10 Essential (primary) hypertension: Secondary | ICD-10-CM

## 2018-09-28 LAB — URINALYSIS, ROUTINE W REFLEX MICROSCOPIC
Bilirubin, UA: NEGATIVE
Glucose, UA: NEGATIVE
Ketones, UA: NEGATIVE
Leukocytes,UA: NEGATIVE
Nitrite, UA: NEGATIVE
Protein,UA: NEGATIVE
RBC, UA: NEGATIVE
Specific Gravity, UA: 1.02 (ref 1.005–1.030)
Urobilinogen, Ur: 0.2 mg/dL (ref 0.2–1.0)
pH, UA: 6 (ref 5.0–7.5)

## 2018-09-28 NOTE — Patient Instructions (Signed)
[name] [name] , Thank you for taking time to come for your Medicare Wellness Visit. I appreciate your ongoing commitment to your health goals. Please review the following plan we discussed and let me know if I can assist you in the future.   Screening recommendations/referrals: Colonoscopy: declined at this time.  Recommended yearly ophthalmology/optometry visit for glaucoma screening and checkup Recommended yearly dental visit for hygiene and checkup  Vaccinations: Influenza vaccine: up to date  Pneumococcal vaccine: up to date  Tdap vaccine: due, check with your insurance company for coverage  Shingles vaccine: Shingrix eligible, check with your insurance company for coverage  Advanced directives: copy on file   Conditions/risks identified: none   Next appointment: follow up in one year for your annual wellness exam.   Preventive Care 65 Years and Older, Male Preventive care refers to lifestyle choices and visits with your health care provider that can promote health and wellness. What does preventive care include?  A yearly physical exam. This is also called an annual well check.  Dental exams once or twice a year.  Routine eye exams. Ask your health care provider how often you should have your eyes checked.  Personal lifestyle choices, including:  Daily care of your teeth and gums.  Regular physical activity.  Eating a healthy diet.  Avoiding tobacco and drug use.  Limiting alcohol use.  Practicing safe sex.  Taking low doses of aspirin every day.  Taking vitamin and mineral supplements as recommended by your health care provider. What happens during an annual well check? The services and screenings done by your health care provider during your annual well check will depend on your age, overall health, lifestyle risk factors, and family history of disease. Counseling  Your health care provider may ask you questions about your:  Alcohol use.  Tobacco use.   Drug use.  Emotional well-being.  Home and relationship well-being.  Sexual activity.  Eating habits.  History of falls.  Memory and ability to understand (cognition).  Work and work Statistician. Screening  You may have the following tests or measurements:  Height, weight, and BMI.  Blood pressure.  Lipid and cholesterol levels. These may be checked every 5 years, or more frequently if you are over 70 years old.  Skin check.  Lung cancer screening. You may have this screening every year starting at age 79 if you have a 30-pack-year history of smoking and currently smoke or have quit within the past 15 years.  Fecal occult blood test (FOBT) of the stool. You may have this test every year starting at age 32.  Flexible sigmoidoscopy or colonoscopy. You may have a sigmoidoscopy every 5 years or a colonoscopy every 10 years starting at age 76.  Prostate cancer screening. Recommendations will vary depending on your family history and other risks.  Hepatitis C blood test.  Hepatitis B blood test.  Sexually transmitted disease (STD) testing.  Diabetes screening. This is done by checking your blood sugar (glucose) after you have not eaten for a while (fasting). You may have this done every 1-3 years.  Abdominal aortic aneurysm (AAA) screening. You may need this if you are a current or former smoker.  Osteoporosis. You may be screened starting at age 7 if you are at high risk. Talk with your health care provider about your test results, treatment options, and if necessary, the need for more tests. Vaccines  Your health care provider may recommend certain vaccines, such as:  Influenza vaccine. This is recommended  every year.  Tetanus, diphtheria, and acellular pertussis (Tdap, Td) vaccine. You may need a Td booster every 10 years.  Zoster vaccine. You may need this after age 64.  Pneumococcal 13-valent conjugate (PCV13) vaccine. One dose is recommended after age 44.   Pneumococcal polysaccharide (PPSV23) vaccine. One dose is recommended after age 22. Talk to your health care provider about which screenings and vaccines you need and how often you need them. This information is not intended to replace advice given to you by your health care provider. Make sure you discuss any questions you have with your health care provider. Document Released: 03/31/2015 Document Revised: 11/22/2015 Document Reviewed: 01/03/2015 Elsevier Interactive Patient Education  2017 [place] Prevention in the Home Falls can cause injuries. They can happen to people of all ages. There are many things you can do to make your home safe and to help prevent falls. What can I do on the outside of my home?  Regularly fix the edges of walkways and driveways and fix any cracks.  Remove anything that might make you trip as you walk through a door, such as a raised step or threshold.  Trim any bushes or trees on the path to your home.  Use bright outdoor lighting.  Clear any walking paths of anything that might make someone trip, such as rocks or tools.  Regularly check to see if handrails are loose or broken. Make sure that both sides of any steps have handrails.  Any raised decks and porches should have guardrails on the edges.  Have any leaves, snow, or ice cleared regularly.  Use sand or salt on walking paths during winter.  Clean up any spills in your garage right away. This includes oil or grease spills. What can I do in the bathroom?  Use night lights.  Install grab bars by the toilet and in the tub and shower. Do not use towel bars as grab bars.  Use non-skid mats or decals in the tub or shower.  If you need to sit down in the shower, use a plastic, non-slip stool.  Keep the floor dry. Clean up any water that spills on the floor as soon as it happens.  Remove soap buildup in the tub or shower regularly.  Attach bath mats securely with double-sided non-slip  rug tape.  Do not have throw rugs and other things on the floor that can make you trip. What can I do in the bedroom?  Use night lights.  Make sure that you have a light by your bed that is easy to reach.  Do not use any sheets or blankets that are too big for your bed. They should not hang down onto the floor.  Have a firm chair that has side arms. You can use this for support while you get dressed.  Do not have throw rugs and other things on the floor that can make you trip. What can I do in the kitchen?  Clean up any spills right away.  Avoid walking on wet floors.  Keep items that you use a lot in easy-to-reach places.  If you need to reach something above you, use a strong step stool that has a grab bar.  Keep electrical cords out of the way.  Do not use floor polish or wax that makes floors slippery. If you must use wax, use non-skid floor wax.  Do not have throw rugs and other things on the floor that can make you trip. What  can I do with my stairs?  Do not leave any items on the stairs.  Make sure that there are handrails on both sides of the stairs and use them. Fix handrails that are broken or loose. Make sure that handrails are as long as the stairways.  Check any carpeting to make sure that it is firmly attached to the stairs. Fix any carpet that is loose or worn.  Avoid having throw rugs at the top or bottom of the stairs. If you do have throw rugs, attach them to the floor with carpet tape.  Make sure that you have a light switch at the top of the stairs and the bottom of the stairs. If you do not have them, ask someone to add them for you. What else can I do to help prevent falls?  Wear shoes that:  Do not have high heels.  Have rubber bottoms.  Are comfortable and fit you well.  Are closed at the toe. Do not wear sandals.  If you use a stepladder:  Make sure that it is fully opened. Do not climb a closed stepladder.  Make sure that both sides of  the stepladder are locked into place.  Ask someone to hold it for you, if possible.  Clearly mark and make sure that you can see:  Any grab bars or handrails.  First and last steps.  Where the edge of each step is.  Use tools that help you move around (mobility aids) if they are needed. These include:  Canes.  Walkers.  Scooters.  Crutches.  Turn on the lights when you go into a dark area. Replace any light bulbs as soon as they burn out.  Set up your furniture so you have a clear path. Avoid moving your furniture around.  If any of your floors are uneven, fix them.  If there are any pets around you, be aware of where they are.  Review your medicines with your doctor. Some medicines can make you feel dizzy. This can increase your chance of falling. Ask your doctor what other things that you can do to help prevent falls. This information is not intended to replace advice given to you by your health care provider. Make sure you discuss any questions you have with your health care provider. Document Released: 12/29/2008 Document Revised: 08/10/2015 Document Reviewed: 04/08/2014 Elsevier Interactive Patient Education  2017 [employer].

## 2018-09-28 NOTE — Progress Notes (Signed)
Subjective:   [name] [name] is a 73 y.o. male who presents for Medicare Annual/Subsequent preventive examination.  Review of Systems:  Cardiac Risk Factors include: advanced age (>30men, >1 women);male gender;hypertension     Objective:    Vitals: BP 134/72 (BP Location: Left Arm, Patient Position: Sitting, Cuff Size: Normal)   Pulse (!) 53   Temp 98 F (36.7 C) (Temporal)   Resp 16   Ht 6\' 1"  (1.854 m)   Wt 218 lb (98.9 kg)   BMI 28.76 kg/m   Body mass index is 28.76 kg/m.  Advanced Directives 09/28/2018 09/03/2017 08/28/2016 05/05/2015 04/18/2015 04/18/2015  Does Patient Have a Medical Advance Directive? Yes Yes Yes No Yes Yes  Type of Advance Directive Living will;Healthcare Power of Attorney Living will;Healthcare Power of [place];Living will - Living will Living will;Healthcare Power of Attorney  Does patient want to make changes to medical advance directive? - - - - No - Patient declined No - Patient declined  Copy of [place] in Chart? Yes - validated most recent copy scanned in chart (See row information) Yes Yes No - copy requested No - copy requested No - copy requested  Would patient like information on creating a medical advance directive? - - - No - patient declined information - -    Tobacco Social History   Tobacco Use  Smoking Status Former Smoker  . Types: Cigarettes  . Quit date: 09/05/1994  . Years since quitting: 24.0  Smokeless Tobacco Former Systems developer  . Types: Chew  . Quit date: 09/04/2004     Counseling given: Not Answered   Clinical Intake:  Pre-visit preparation completed: Yes  Pain : No/denies pain     Nutritional Risks: None Diabetes: No  How often do you need to have someone help you when you read instructions, pamphlets, or other written materials from your doctor or pharmacy?: 1 - Never What is the last grade level you completed in school?: associates  Interpreter Needed?: No   Information entered by :: Tiffany Hill,LPN  Past Medical History:  Diagnosis Date  . A-fib ([place])   . Atrial fibrillation with RVR ([place])   . Dupuytren's contracture   . Essential hypertension 09/05/2014  . Hypertension   . Hypokalemia   . Hypothyroidism   . 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 Mitral regurgitation    a. echo 03/2015: echo 03/2015: EF 50-55%, no RWMA, no sufficienct to allow for LV diastolic fxn, mild MR, LA mildly dilated at 46 mm, RV systolic fxn nl, PASP nl  . Nephrolithiasis   . New onset atrial fibrillation ([place]) 04/18/2015  . OSA (obstructive sleep apnea) 05/11/2015  . Persistent atrial fibrillation    Past Surgical History:  Procedure Laterality Date  . COLONOSCOPY WITH PROPOFOL N/A 04/18/2015   Procedure: COLONOSCOPY WITH PROPOFOL;  Surgeon: Lucilla Lame, MD;  Location: [facility] ENDOSCOPY;  Service: Endoscopy;  Laterality: N/A;  . ELECTROPHYSIOLOGIC STUDY N/A 05/05/2015   Procedure: CARDIOVERSION;  Surgeon: Wellington Hampshire, MD;  Location: [facility] ORS;  Service: Cardiovascular;  Laterality: N/A;  . RADIOACTIVE SEED IMPLANT N/A    Family History  Problem Relation Age of Onset  . Heart attack Mother   . Heart disease Father    Social History   Socioeconomic History  . Marital status: Married    Spouse name: Not on file  . Number of children: Not on file  . Years of education:  Not on file  . Highest education level: Associate degree: academic program  Occupational History  . Occupation: retired   Scientific laboratory technician  . Financial resource strain: Not hard at all  . Food insecurity    Worry: Never true    Inability: Never true  . Transportation needs    Medical: No    Non-medical: No  Tobacco Use  . Smoking status: Former Smoker    Types: Cigarettes    Quit date: 09/05/1994    Years since quitting: 24.0  . Smokeless tobacco: Former Systems developer    Types: [place] date: 09/04/2004  Substance and Sexual Activity  . Alcohol  use: No  . Drug use: No  . Sexual activity: Not on file  Lifestyle  . Physical activity    Days per week: 2 days    Minutes per session: 120 min  . Stress: Not at all  Relationships  . Social Herbalist on phone: Never    Gets together: Three times a week    Attends religious service: More than 4 times per year    Active member of club or organization: Yes    Attends meetings of clubs or organizations: More than 4 times per year    Relationship status: Married  Other Topics Concern  . Not on file  Social History Narrative   Plays racket ball twice a week, works outside     Outpatient Encounter Medications as of 09/28/2018  Medication Sig  . amiodarone (PACERONE) 200 MG tablet Take 1 tablet (200 mg total) by mouth daily.  Marland Kitchen apixaban (ELIQUIS) 5 MG TABS tablet Take 1 tablet (5 mg total) by mouth 2 (two) times daily.  Marland Kitchen levothyroxine (SYNTHROID, LEVOTHROID) 50 MCG tablet Take 1 tablet (50 mcg total) by mouth daily before breakfast.  . metoprolol tartrate (LOPRESSOR) 50 MG tablet Take 1 tablet (50 mg total) by mouth 2 (two) times daily.  . Multiple Vitamin (MULTIVITAMIN) tablet Take 1 tablet by mouth daily.   No facility-administered encounter medications on file as of 09/28/2018.     Activities of Daily Living In your present state of health, do you have any difficulty performing the following activities: 09/28/2018  Hearing? N  Vision? N  Difficulty concentrating or making decisions? N  Walking or climbing stairs? N  Dressing or bathing? N  Doing errands, shopping? N  Preparing Food and eating ? N  Using the Toilet? N  In the past six months, have you accidently leaked urine? N  Do you have problems with loss of bowel control? N  Managing your Medications? N  Managing your Finances? N  Housekeeping or managing your Housekeeping? N  Some recent data might be hidden    Patient Care Team: Guadalupe Maple, MD as PCP - General (Family Medicine) Guadalupe Maple,  MD as PCP - Family Medicine (Family Medicine) Lucilla Lame, MD as Consulting Physician (Gastroenterology) Minna Merritts, MD as Consulting Physician (Cardiology) Dasher, Rayvon Char, MD as Consulting Physician (Dermatology)   Assessment:   This is a routine wellness examination for [place].  Exercise Activities and Dietary recommendations Current Exercise Habits: Home exercise routine, Frequency (Times/Week): 2, Intensity: Mild, Exercise limited by: None identified  Goals    . DIET - INCREASE WATER INTAKE     Recommend drinking at least 6-8 glasses of water a day     . Increase water intake     Recommend drinking at least 3-4 glasses of water a  day        Fall Risk: Fall Risk  09/28/2018 09/24/2017 09/03/2017 09/12/2016 08/28/2016  Falls in the past year? 0 No No No No    FALL RISK PREVENTION PERTAINING TO THE HOME:  Any stairs in or around the home? No, 2 steps  If so, are there any without handrails? No   Home free of loose throw rugs in walkways, pet beds, electrical cords, etc? Yes  Adequate lighting in your home to reduce risk of falls? Yes   ASSISTIVE DEVICES UTILIZED TO PREVENT FALLS:  Life alert? No  Use of a cane, walker or w/c? No  Grab bars in the bathroom? No  Shower chair or bench in shower? No  Elevated toilet seat or a handicapped toilet? No   TIMED UP AND GO:  Was the test performed? Yes .  Length of time to ambulate 10 feet: 12 sec.   GAIT:  Appearance of gait: Gait steady and fast without the use of an assistive device.  Education: Fall risk prevention has been discussed.  Intervention(s) required? No  DME/home health order needed?  No   Depression Screen PHQ 2/9 Scores 09/28/2018 09/24/2017 09/03/2017 09/12/2016  PHQ - 2 Score 0 0 0 0  PHQ- 9 Score 0 - - -    Cognitive Function     6CIT Screen 09/28/2018 09/03/2017 08/28/2016  What Year? 0 points 0 points 0 points  What month? 0 points 0 points 0 points  What time? 0 points 0 points 0 points   Count back from 20 0 points 0 points 0 points  Months in reverse 0 points 0 points 0 points  Repeat phrase 0 points 0 points 0 points  Total Score 0 0 0    Immunization History  Administered Date(s) Administered  . Influenza, High Dose Seasonal PF 03/07/2016, 03/26/2017, 03/31/2018  . Influenza,inj,Quad PF,6+ Mos 03/07/2015  . Pneumococcal Conjugate-13 02/28/2014  . Pneumococcal Polysaccharide-23 08/02/2009  . Tdap 07/28/2008  . Zoster 08/14/2010    Qualifies for Shingles Vaccine? Yes  Zostavax completed 08/14/2010. Due for Shingrix. Education has been provided regarding the importance of this vaccine. Pt has been advised to call insurance company to determine out of pocket expense. Advised may also receive vaccine at local pharmacy or Health Dept. Verbalized acceptance and understanding.  Tdap: Although this vaccine is not a covered service during a Wellness Exam, does the patient still wish to receive this vaccine today?  No .  Education has been provided regarding the importance of this vaccine. Advised may receive this vaccine at local pharmacy or Health Dept. Aware to provide a copy of the vaccination record if obtained from local pharmacy or Health Dept. Verbalized acceptance and understanding.  Flu Vaccine: up to date   Pneumococcal Vaccine: up to date   Screening Tests Health Maintenance  Topic Date Due  . TETANUS/TDAP  07/29/2018  . INFLUENZA VACCINE  10/17/2018  . COLONOSCOPY  04/17/2025  . Hepatitis C Screening  Completed   Cancer Screenings:  Colorectal Screening: wsa not completed in 2017 per patient due to a-fib, due- declined   Lung Cancer Screening: (Low Dose CT Chest recommended if Age 58-80 years, 30 pack-year currently smoking OR have quit w/in 15years.) does not qualify.    Additional Screening:  Hepatitis C Screening: does qualify; Completed 09/24/2017  Vision Screening: Recommended annual ophthalmology exams for early detection of glaucoma and other  disorders of the eye. Is the patient up to date with their annual eye exam?  Yes  Who is the provider or what is the name of the office in which the pt attends annual eye exams? [facility]   Dental Screening: Recommended annual dental exams for proper oral hygiene  Community Resource Referral:  CRR required this visit?  No        Plan:  I have personally reviewed and addressed the Medicare Annual Wellness questionnaire and have noted the following in the patient's chart:  A. Medical and social history B. Use of alcohol, tobacco or illicit drugs  C. Current medications and supplements D. Functional ability and status E.  Nutritional status F.  Physical activity G. Advance directives H. List of other physicians I.  Hospitalizations, surgeries, and ER visits in previous 12 months J.  [place] such as hearing and vision if needed, cognitive and depression L. Referrals and appointments   In addition, I have reviewed and discussed with patient certain preventive protocols, quality metrics, and best practice recommendations. A written personalized care plan for preventive services as well as general preventive health recommendations were provided to patient.   Signed,   Bevelyn Ngo, LPN  7/82/4235 Nurse Health Advisor   Nurse Notes: none

## 2018-09-29 LAB — LIPID PANEL W/O CHOL/HDL RATIO
Cholesterol, Total: 153 mg/dL (ref 100–199)
HDL: 49 mg/dL (ref >39)
LDL Calculated: 94 mg/dL (ref 0–99)
Triglycerides: 50 mg/dL (ref 0–149)
VLDL Cholesterol Cal: 10 mg/dL (ref 5–40)

## 2018-09-29 LAB — COMPREHENSIVE METABOLIC PANEL
ALT: 16 IU/L (ref 0–44)
AST: 15 IU/L (ref 0–40)
Albumin/Globulin Ratio: 2.4 — ABNORMAL HIGH (ref 1.2–2.2)
Albumin: 4.3 g/dL (ref 3.7–4.7)
Alkaline Phosphatase: 97 IU/L (ref 39–117)
BUN/Creatinine Ratio: 11 (ref 10–24)
BUN: 12 mg/dL (ref 8–27)
Bilirubin Total: 0.9 mg/dL (ref 0.0–1.2)
CO2: 26 mmol/L (ref 20–29)
Calcium: 9.2 mg/dL (ref 8.6–10.2)
Chloride: 106 mmol/L (ref 96–106)
Creatinine, Ser: 1.12 mg/dL (ref 0.76–1.27)
GFR calc Af Amer: 75 mL/min/{1.73_m2} (ref >59)
GFR calc non Af Amer: 65 mL/min/{1.73_m2} (ref >59)
Globulin, Total: 1.8 g/dL (ref 1.5–4.5)
Glucose: 103 mg/dL — ABNORMAL HIGH (ref 65–99)
Potassium: 4.4 mmol/L (ref 3.5–5.2)
Sodium: 145 mmol/L — ABNORMAL HIGH (ref 134–144)
Total Protein: 6.1 g/dL (ref 6.0–8.5)

## 2018-09-29 LAB — CBC WITH DIFFERENTIAL/PLATELET
Basophils Absolute: 0 10*3/uL (ref 0.0–0.2)
Basos: 1 %
EOS (ABSOLUTE): 0.1 10*3/uL (ref 0.0–0.4)
Eos: 2 %
Hematocrit: 47.6 % (ref 37.5–51.0)
Hemoglobin: 15 g/dL (ref 13.0–17.7)
Immature Grans (Abs): 0 10*3/uL (ref 0.0–0.1)
Immature Granulocytes: 1 %
Lymphocytes Absolute: 1.5 10*3/uL (ref 0.7–3.1)
Lymphs: 35 %
MCH: 29.8 pg (ref 26.6–33.0)
MCHC: 31.5 g/dL (ref 31.5–35.7)
MCV: 95 fL (ref 79–97)
Monocytes Absolute: 0.6 10*3/uL (ref 0.1–0.9)
Monocytes: 14 %
Neutrophils Absolute: 2 10*3/uL (ref 1.4–7.0)
Neutrophils: 47 %
Platelets: 143 10*3/uL — ABNORMAL LOW (ref 150–450)
RBC: 5.03 x10E6/uL (ref 4.14–5.80)
RDW: 13.8 % (ref 11.6–15.4)
WBC: 4.2 10*3/uL (ref 3.4–10.8)

## 2018-09-29 LAB — TSH: TSH: 5.82 u[IU]/mL — ABNORMAL HIGH (ref 0.450–4.500)

## 2018-09-29 LAB — PSA: Prostate Specific Ag, Serum: <0.1 ng/mL (ref 0.0–4.0)

## 2018-09-30 ENCOUNTER — Other Ambulatory Visit: Payer: Self-pay

## 2018-09-30 ENCOUNTER — Encounter: Payer: Self-pay | Admitting: Family Medicine

## 2018-09-30 ENCOUNTER — Ambulatory Visit (INDEPENDENT_AMBULATORY_CARE_PROVIDER_SITE_OTHER): Payer: PPO | Admitting: Family Medicine

## 2018-09-30 DIAGNOSIS — Z7189 Other specified counseling: Secondary | ICD-10-CM

## 2018-09-30 DIAGNOSIS — M720 Palmar fascial fibromatosis [Dupuytren]: Secondary | ICD-10-CM | POA: Diagnosis not present

## 2018-09-30 DIAGNOSIS — E039 Hypothyroidism, unspecified: Secondary | ICD-10-CM | POA: Diagnosis not present

## 2018-09-30 DIAGNOSIS — I10 Essential (primary) hypertension: Secondary | ICD-10-CM | POA: Diagnosis not present

## 2018-09-30 DIAGNOSIS — I4891 Unspecified atrial fibrillation: Secondary | ICD-10-CM

## 2018-09-30 MED ORDER — LEVOTHYROXINE SODIUM 50 MCG PO TABS: 50.0000 ug | ORAL_TABLET | Freq: Every day | ORAL | 4 refills | Status: DC

## 2018-09-30 MED ORDER — LEVOTHYROXINE SODIUM 75 MCG PO TABS: 75.0000 ug | ORAL_TABLET | Freq: Every day | ORAL | 0 refills | Status: DC

## 2018-09-30 NOTE — Assessment & Plan Note (Signed)
A voluntary discussion about advanced care planning including explanation and discussion of advanced directives was extentively discussed with the patient.  Explained about the healthcare proxy and living will was reviewed and packet with forms with expiration of how to fill them out was given.  Time spent: Encounter 16+ min individuals present: Patient 

## 2018-09-30 NOTE — Assessment & Plan Note (Signed)
Discussed hypothyroid and interaction of thyroid medication with Pacerone will need to increase thyroid from 50 mcg a day to 75 mcg a day recheck TSH in 2 months

## 2018-09-30 NOTE — Assessment & Plan Note (Signed)
Discussed atrial fibrillation doing well no complaints from medications will continue as per Dr. Rockey Situ

## 2018-09-30 NOTE — Progress Notes (Signed)
There were no vitals taken for this visit.   Subjective:    Patient ID: [name] [name], male    DOB: 1945/08/02, 73 y.o.   MRN: [id number]  HPI: [name] [name] is a 73 y.o. male  Med check  Discussed with patient doing well with medications been on Pacerone almost a year with good control of heart rate taking metoprolol and Eliquis without problems bleeding bruising issues. Patient's been taking his thyroid medications faithfully 50 mcg without problems.  Has not had his levels checked until earlier this week since starting Pacerone.   Relevant past medical, surgical, family and social history reviewed and updated as indicated. Interim medical history since our last visit reviewed. Allergies and medications reviewed and updated.  Review of Systems  Constitutional: Negative.   HENT: Negative.   Eyes: Negative.   Respiratory: Negative.   Cardiovascular: Negative.   Gastrointestinal: Negative.   Endocrine: Negative.   Genitourinary: Negative.   Musculoskeletal: Negative.   Skin: Negative.   Allergic/Immunologic: Negative.   Neurological: Negative.   Hematological: Negative.   Psychiatric/Behavioral: Negative.     Per HPI unless specifically indicated above     Objective:    There were no vitals taken for this visit.  Wt Readings from Last 3 Encounters:  09/28/18 218 lb (98.9 kg)  03/31/18 242 lb 6.4 oz (110 kg)  09/24/17 233 lb (105.7 kg)    Physical Exam  Results for orders placed or performed in visit on 09/28/18  PSA  Result Value Ref Range   Prostate Specific Ag, Serum <0.1 0.0 - 4.0 ng/mL  TSH  Result Value Ref Range   TSH 5.820 (H) 0.450 - 4.500 uIU/mL  Comp Met (CMET)  Result Value Ref Range   Glucose 103 (H) 65 - 99 mg/dL   BUN 12 8 - 27 mg/dL   Creatinine, Ser 1.12 0.76 - 1.27 mg/dL   GFR calc non Af Amer 65 >59 mL/min/1.73   GFR calc Af Amer 75 >59 mL/min/1.73   BUN/Creatinine Ratio 11 10 - 24   Sodium 145 (H) 134 - 144 mmol/L   Potassium  4.4 3.5 - 5.2 mmol/L   Chloride 106 96 - 106 mmol/L   CO2 26 20 - 29 mmol/L   Calcium 9.2 8.6 - 10.2 mg/dL   Total Protein 6.1 6.0 - 8.5 g/dL   Albumin 4.3 3.7 - 4.7 g/dL   Globulin, Total 1.8 1.5 - 4.5 g/dL   Albumin/Globulin Ratio 2.4 (H) 1.2 - 2.2   Bilirubin Total 0.9 0.0 - 1.2 mg/dL   Alkaline Phosphatase 97 39 - 117 IU/L   AST 15 0 - 40 IU/L   ALT 16 0 - 44 IU/L  Lipid Panel w/o Chol/HDL Ratio  Result Value Ref Range   Cholesterol, Total 153 100 - 199 mg/dL   Triglycerides 50 0 - 149 mg/dL   HDL 49 >39 mg/dL   VLDL Cholesterol Cal 10 5 - 40 mg/dL   LDL Calculated 94 0 - 99 mg/dL  Urinalysis, Routine w reflex microscopic  Result Value Ref Range   Specific Gravity, UA 1.020 1.005 - 1.030   pH, UA 6.0 5.0 - 7.5   Color, UA Yellow Yellow   Appearance Ur Clear Clear   Leukocytes,UA Negative Negative   Protein,UA Negative Negative/Trace   Glucose, UA Negative Negative   Ketones, UA Negative Negative   RBC, UA Negative Negative   Bilirubin, UA Negative Negative   Urobilinogen, Ur 0.2 0.2 - 1.0 mg/dL  Nitrite, UA Negative Negative  CBC with Differential  Result Value Ref Range   WBC 4.2 3.4 - 10.8 x10E3/uL   RBC 5.03 4.14 - 5.80 x10E6/uL   Hemoglobin 15.0 13.0 - 17.7 g/dL   Hematocrit 47.6 37.5 - 51.0 %   MCV 95 79 - 97 fL   MCH 29.8 26.6 - 33.0 pg   MCHC 31.5 31.5 - 35.7 g/dL   RDW 13.8 11.6 - 15.4 %   Platelets 143 (L) 150 - 450 x10E3/uL   Neutrophils 47 Not Estab. %   Lymphs 35 Not Estab. %   Monocytes 14 Not Estab. %   Eos 2 Not Estab. %   Basos 1 Not Estab. %   Neutrophils Absolute 2.0 1.4 - 7.0 x10E3/uL   Lymphocytes Absolute 1.5 0.7 - 3.1 x10E3/uL   Monocytes Absolute 0.6 0.1 - 0.9 x10E3/uL   EOS (ABSOLUTE) 0.1 0.0 - 0.4 x10E3/uL   Basophils Absolute 0.0 0.0 - 0.2 x10E3/uL   Immature Granulocytes 1 Not Estab. %   Immature Grans (Abs) 0.0 0.0 - 0.1 x10E3/uL      Assessment & Plan:   Problem List Items Addressed This Visit      Cardiovascular and  Mediastinum   Essential hypertension (Chronic)    The current medical regimen is effective;  continue present plan and medications.       Atrial fibrillation with RVR (HCC)    Discussed atrial fibrillation doing well no complaints from medications will continue as per Dr. Gollan        Endocrine   Hypothyroidism (Chronic)    Discussed hypothyroid and interaction of thyroid medication with Pacerone will need to increase thyroid from 50 mcg a day to 75 mcg a day recheck TSH in 2 months      Relevant Medications   levothyroxine (SYNTHROID) 75 MCG tablet   Other Relevant Orders   TSH     Other   Dupuytren's contracture    The current medical regimen is effective;  continue present plan and medications.       Advanced care planning/counseling discussion    A voluntary discussion about advanced care planning including explanation and discussion of advanced directives was extentively discussed with the patient.  Explained about the healthcare proxy and living will was reviewed and packet with forms with expiration of how to fill them out was given.  Time spent: Encounter 16+ min individuals present: Patient          Follow up plan: Return for Physical Exam, in office soon and in 6 months follow-up with labs, BMP,  Lipids, ALT, AST.      

## 2018-09-30 NOTE — Assessment & Plan Note (Signed)
The current medical regimen is effective;  continue present plan and medications.  

## 2018-10-21 ENCOUNTER — Other Ambulatory Visit: Payer: Self-pay

## 2018-10-21 ENCOUNTER — Ambulatory Visit (INDEPENDENT_AMBULATORY_CARE_PROVIDER_SITE_OTHER): Payer: PPO | Admitting: Nurse Practitioner

## 2018-10-21 ENCOUNTER — Encounter: Payer: Self-pay | Admitting: Nurse Practitioner

## 2018-10-21 VITALS — BP 142/88 | HR 62 | Temp 98.1°F | Ht 73.0 in | Wt 216.2 lb

## 2018-10-21 DIAGNOSIS — E039 Hypothyroidism, unspecified: Secondary | ICD-10-CM

## 2018-10-21 DIAGNOSIS — G4733 Obstructive sleep apnea (adult) (pediatric): Secondary | ICD-10-CM

## 2018-10-21 DIAGNOSIS — Z0000 Encounter for general adult medical examination without abnormal findings: Secondary | ICD-10-CM

## 2018-10-21 DIAGNOSIS — I10 Essential (primary) hypertension: Secondary | ICD-10-CM | POA: Diagnosis not present

## 2018-10-21 DIAGNOSIS — I4891 Unspecified atrial fibrillation: Secondary | ICD-10-CM

## 2018-10-21 NOTE — Patient Instructions (Signed)

## 2018-10-21 NOTE — Assessment & Plan Note (Signed)
Chronic, stable rate control.  Continue current medication regimen and collaboration with caridology.   

## 2018-10-21 NOTE — Assessment & Plan Note (Signed)
Chronic, ongoing with initial elevation in BP, but repeat closer to goal.  Continue current medication regimen.  Recommend checking BP three mornings a week and documenting.  Continue collaboration with cardiology.

## 2018-10-21 NOTE — Assessment & Plan Note (Signed)
Chronic, ongoing.  Continue current medication regimen and adjust as needed.  Return for blood work as scheduled.

## 2018-10-21 NOTE — Progress Notes (Signed)
BP (!) 142/88 (BP Location: Left Arm)   Pulse 62   Temp 98.1 F (36.7 C) (Oral)   Ht 6' 1"  (1.854 m)   Wt 216 lb 3.2 oz (98.1 kg)   SpO2 99%   BMI 28.52 kg/m    Subjective:    Patient ID: [name] [name], male    DOB: 1945-06-18, 73 y.o.   MRN: [id number]  HPI: [name] [name] is a 73 y.o. male presenting on 10/21/2018 for comprehensive medical examination. Current medical complaints include:none  He currently lives with: wife Interim Problems from his last visit: no   HYPERTENSION / HYPERLIPIDEMIA Currently taking Metoprolol 50 MG BID and Amiodarone 200 MG daily.  Followed by Dr. Rockey Situ, talked to him on phone in April.  Sees him again in October.  No medication changes. Satisfied with current treatment? yes Duration of hypertension: chronic BP monitoring frequency: a few times a week BP range: 150-170/80 BP medication side effects: no Duration of hyperlipidemia: chronic Cholesterol medication side effects: no Cholesterol supplements: none Medication compliance: good compliance Aspirin: no Recent stressors: no Recurrent headaches: no Visual changes: no Palpitations: no Dyspnea: no Chest pain: no Lower extremity edema: no Dizzy/lightheaded: no   ATRIAL FIBRILLATION Continues on Amiodarone, Metoprolol, and Apixaban. Atrial fibrillation status: stable Satisfied with current treatment: yes  Medication side effects:  no Medication compliance: good compliance Etiology of atrial fibrillation:  Palpitations:  no Chest pain:  no Dyspnea on exertion:  no Orthopnea:  no Syncope:  no Edema:  no Ventricular rate control: B-blocker Anti-coagulation: long acting  HYPOTHYROIDISM Recent TSH 5.820.  Continues on Levothyroxine 75 MCG. Thyroid control status:stable Satisfied with current treatment? yes Medication side effects: no Medication compliance: good compliance Etiology of hypothyroidism:  Recent dose adjustment:yes Fatigue: no Cold intolerance: no Heat  intolerance: no Weight gain: no Weight loss: no Constipation: no Diarrhea/loose stools: no Palpitations: no Lower extremity edema: no Anxiety/depressed mood: no  Functional Status Survey: Is the patient deaf or have difficulty hearing?: No Does the patient have difficulty seeing, even when wearing glasses/contacts?: No Does the patient have difficulty concentrating, remembering, or making decisions?: No Does the patient have difficulty walking or climbing stairs?: No Does the patient have difficulty dressing or bathing?: No Does the patient have difficulty doing errands alone such as visiting a doctor's office or shopping?: No  FALL RISK: Fall Risk  09/28/2018 09/24/2017 09/03/2017 09/12/2016 08/28/2016  Falls in the past year? 0 No No No No    Depression Screen Depression screen [facility] 2/9 09/28/2018 09/24/2017 09/03/2017 09/12/2016 08/28/2016  Decreased Interest 0 0 0 0 0  Down, Depressed, Hopeless 0 0 0 0 0  PHQ - 2 Score 0 0 0 0 0  Altered sleeping 0 - - - -  Tired, decreased energy 0 - - - -  Change in appetite 0 - - - -  Feeling bad or failure about yourself  0 - - - -  Trouble concentrating 0 - - - -  Moving slowly or fidgety/restless 0 - - - -  Suicidal thoughts 0 - - - -  PHQ-9 Score 0 - - - -  Difficult doing work/chores Not difficult at all - - - -    Advanced Directives <no information>  Past Medical History:  Past Medical History:  Diagnosis Date  . A-fib ([place])   . Atrial fibrillation with RVR ([place])   . Dupuytren's contracture   . Essential hypertension 09/05/2014  . Hypertension   . Hypokalemia   .  Hypothyroidism   . 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 Mitral regurgitation    a. echo 03/2015: echo 03/2015: EF 50-55%, no RWMA, no sufficienct to allow for LV diastolic fxn, mild MR, LA mildly dilated at 46 mm, RV systolic fxn nl, PASP nl  . Nephrolithiasis   . New onset atrial fibrillation ([place]) 04/18/2015  . OSA  (obstructive sleep apnea) 05/11/2015  . Persistent atrial fibrillation     Surgical History:  Past Surgical History:  Procedure Laterality Date  . COLONOSCOPY WITH PROPOFOL N/A 04/18/2015   Procedure: COLONOSCOPY WITH PROPOFOL;  Surgeon: Lucilla Lame, MD;  Location: [facility] ENDOSCOPY;  Service: Endoscopy;  Laterality: N/A;  . ELECTROPHYSIOLOGIC STUDY N/A 05/05/2015   Procedure: CARDIOVERSION;  Surgeon: Wellington Hampshire, MD;  Location: [facility] ORS;  Service: Cardiovascular;  Laterality: N/A;  . RADIOACTIVE SEED IMPLANT N/A     Medications:  Current Outpatient Medications on File Prior to Visit  Medication Sig  . amiodarone (PACERONE) 200 MG tablet Take 1 tablet (200 mg total) by mouth daily.  Marland Kitchen apixaban (ELIQUIS) 5 MG TABS tablet Take 1 tablet (5 mg total) by mouth 2 (two) times daily.  Marland Kitchen levothyroxine (SYNTHROID) 75 MCG tablet Take 1 tablet (75 mcg total) by mouth daily.  . metoprolol tartrate (LOPRESSOR) 50 MG tablet Take 1 tablet (50 mg total) by mouth 2 (two) times daily.  . Multiple Vitamin (MULTIVITAMIN) tablet Take 1 tablet by mouth daily.   No current facility-administered medications on file prior to visit.     Allergies:  No Known Allergies  Social History:  Social History   Socioeconomic History  . Marital status: Married    Spouse name: Not on file  . Number of children: Not on file  . Years of education: Not on file  . Highest education level: Associate degree: academic program  Occupational History  . Occupation: retired   Scientific laboratory technician  . Financial resource strain: Not hard at all  . Food insecurity    Worry: Never true    Inability: Never true  . Transportation needs    Medical: No    Non-medical: No  Tobacco Use  . Smoking status: Former Smoker    Types: Cigarettes    Quit date: 09/05/1994    Years since quitting: 24.1  . Smokeless tobacco: Former Systems developer    Types: [place] date: 09/04/2004  Substance and Sexual Activity  . Alcohol use: No  . Drug use: No   . Sexual activity: Not on file  Lifestyle  . Physical activity    Days per week: 2 days    Minutes per session: 120 min  . Stress: Not at all  Relationships  . Social Herbalist on phone: Never    Gets together: Three times a week    Attends religious service: More than 4 times per year    Active member of club or organization: Yes    Attends meetings of clubs or organizations: More than 4 times per year    Relationship status: Married  . Intimate partner violence    Fear of current or ex partner: No    Emotionally abused: No    Physically abused: No    Forced sexual activity: No  Other Topics Concern  . Not on file  Social History Narrative   Plays racket ball twice a week, works outside    Social History   Tobacco Use  Smoking  Status Former Smoker  . Types: Cigarettes  . Quit date: 09/05/1994  . Years since quitting: 24.1  Smokeless Tobacco Former Systems developer  . Types: Chew  . Quit date: 09/04/2004   Social History   Substance and Sexual Activity  Alcohol Use No    Family History:  Family History  Problem Relation Age of Onset  . Heart attack Mother   . Heart disease Father     Past medical history, surgical history, medications, allergies, family history and social history reviewed with patient today and changes made to appropriate areas of the chart.   Review of Systems - negative All other ROS negative except what is listed above and in the HPI.      Objective:    BP (!) 142/88 (BP Location: Left Arm)   Pulse 62   Temp 98.1 F (36.7 C) (Oral)   Ht 6' 1"  (1.854 m)   Wt 216 lb 3.2 oz (98.1 kg)   SpO2 99%   BMI 28.52 kg/m   Wt Readings from Last 3 Encounters:  10/21/18 216 lb 3.2 oz (98.1 kg)  09/28/18 218 lb (98.9 kg)  03/31/18 242 lb 6.4 oz (110 kg)    Physical Exam Vitals signs and nursing note reviewed.  Constitutional:      General: He is awake. He is not in acute distress.    Appearance: He is well-developed and overweight. He is  not ill-appearing.  HENT:     Head: Normocephalic and atraumatic.     Right Ear: Hearing, tympanic membrane, ear canal and external ear normal. No drainage.     Left Ear: Hearing, tympanic membrane, ear canal and external ear normal. No drainage.     Nose: Nose normal.     Mouth/Throat:     Mouth: Mucous membranes are moist.     Pharynx: Oropharynx is clear. Uvula midline.  Eyes:     General: Lids are normal.        Right eye: No discharge.        Left eye: No discharge.     Extraocular Movements: Extraocular movements intact.     Conjunctiva/sclera: Conjunctivae normal.     Pupils: Pupils are equal, round, and reactive to light.     Visual Fields: Right eye visual fields normal and left eye visual fields normal.  Neck:     Musculoskeletal: Normal range of motion and neck supple.     Thyroid: No thyromegaly.     Vascular: No carotid bruit.     Trachea: Trachea normal.  Cardiovascular:     Rate and Rhythm: Normal rate and regular rhythm.     Heart sounds: Normal heart sounds, S1 normal and S2 normal. No murmur. No gallop.   Pulmonary:     Effort: Pulmonary effort is normal. No accessory muscle usage or respiratory distress.     Breath sounds: Normal breath sounds.  Abdominal:     General: Bowel sounds are normal.     Palpations: Abdomen is soft. There is no hepatomegaly or splenomegaly.     Tenderness: There is no abdominal tenderness.  Genitourinary:    Comments: Deferred per patient request Musculoskeletal: Normal range of motion.     Right lower leg: No edema.     Left lower leg: No edema.  Skin:    General: Skin is warm and dry.     Capillary Refill: Capillary refill takes less than 2 seconds.     Findings: No rash.  Neurological:     Mental  Status: He is alert and oriented to person, place, and time.     Cranial Nerves: Cranial nerves are intact.     Gait: Gait is intact.     Deep Tendon Reflexes: Reflexes are normal and symmetric.  Psychiatric:        Attention and  Perception: Attention normal.        Mood and Affect: Mood normal.        Speech: Speech normal.        Behavior: Behavior normal. Behavior is cooperative.        Thought Content: Thought content normal.        Cognition and Memory: Cognition normal.        Judgment: Judgment normal.      6CIT Screen 10/21/2018 09/28/2018 09/03/2017 08/28/2016  What Year? 0 points 0 points 0 points 0 points  What month? 0 points 0 points 0 points 0 points  What time? 0 points 0 points 0 points 0 points  Count back from 20 0 points 0 points 0 points 0 points  Months in reverse 0 points 0 points 0 points 0 points  Repeat phrase 0 points 0 points 0 points 0 points  Total Score 0 0 0 0    Results for orders placed or performed in visit on 09/28/18  PSA  Result Value Ref Range   Prostate Specific Ag, Serum <0.1 0.0 - 4.0 ng/mL  TSH  Result Value Ref Range   TSH 5.820 (H) 0.450 - 4.500 uIU/mL  Comp Met (CMET)  Result Value Ref Range   Glucose 103 (H) 65 - 99 mg/dL   BUN 12 8 - 27 mg/dL   Creatinine, Ser 1.12 0.76 - 1.27 mg/dL   GFR calc non Af Amer 65 >59 mL/min/1.73   GFR calc Af Amer 75 >59 mL/min/1.73   BUN/Creatinine Ratio 11 10 - 24   Sodium 145 (H) 134 - 144 mmol/L   Potassium 4.4 3.5 - 5.2 mmol/L   Chloride 106 96 - 106 mmol/L   CO2 26 20 - 29 mmol/L   Calcium 9.2 8.6 - 10.2 mg/dL   Total Protein 6.1 6.0 - 8.5 g/dL   Albumin 4.3 3.7 - 4.7 g/dL   Globulin, Total 1.8 1.5 - 4.5 g/dL   Albumin/Globulin Ratio 2.4 (H) 1.2 - 2.2   Bilirubin Total 0.9 0.0 - 1.2 mg/dL   Alkaline Phosphatase 97 39 - 117 IU/L   AST 15 0 - 40 IU/L   ALT 16 0 - 44 IU/L  Lipid Panel w/o Chol/HDL Ratio  Result Value Ref Range   Cholesterol, Total 153 100 - 199 mg/dL   Triglycerides 50 0 - 149 mg/dL   HDL 49 >39 mg/dL   VLDL Cholesterol Cal 10 5 - 40 mg/dL   LDL Calculated 94 0 - 99 mg/dL  Urinalysis, Routine w reflex microscopic  Result Value Ref Range   Specific Gravity, UA 1.020 1.005 - 1.030   pH, UA 6.0  5.0 - 7.5   Color, UA Yellow Yellow   Appearance Ur Clear Clear   Leukocytes,UA Negative Negative   Protein,UA Negative Negative/Trace   Glucose, UA Negative Negative   Ketones, UA Negative Negative   RBC, UA Negative Negative   Bilirubin, UA Negative Negative   Urobilinogen, Ur 0.2 0.2 - 1.0 mg/dL   Nitrite, UA Negative Negative  CBC with Differential  Result Value Ref Range   WBC 4.2 3.4 - 10.8 x10E3/uL   RBC 5.03 4.14 - 5.80 x10E6/uL  Hemoglobin 15.0 13.0 - 17.7 g/dL   Hematocrit 47.6 37.5 - 51.0 %   MCV 95 79 - 97 fL   MCH 29.8 26.6 - 33.0 pg   MCHC 31.5 31.5 - 35.7 g/dL   RDW 13.8 11.6 - 15.4 %   Platelets 143 (L) 150 - 450 x10E3/uL   Neutrophils 47 Not Estab. %   Lymphs 35 Not Estab. %   Monocytes 14 Not Estab. %   Eos 2 Not Estab. %   Basos 1 Not Estab. %   Neutrophils Absolute 2.0 1.4 - 7.0 x10E3/uL   Lymphocytes Absolute 1.5 0.7 - 3.1 x10E3/uL   Monocytes Absolute 0.6 0.1 - 0.9 x10E3/uL   EOS (ABSOLUTE) 0.1 0.0 - 0.4 x10E3/uL   Basophils Absolute 0.0 0.0 - 0.2 x10E3/uL   Immature Granulocytes 1 Not Estab. %   Immature Grans (Abs) 0.0 0.0 - 0.1 x10E3/uL      Assessment & Plan:   Problem List Items Addressed This Visit      Cardiovascular and Mediastinum   Essential hypertension (Chronic)    Chronic, ongoing with initial elevation in BP, but repeat closer to goal.  Continue current medication regimen.  Recommend checking BP three mornings a week and documenting.  Continue collaboration with cardiology.      Atrial fibrillation with RVR (HCC)    Chronic, stable rate control.  Continue current medication regimen and collaboration with caridology.          Respiratory   OSA (obstructive sleep apnea)    Continue use of CPAP.        Endocrine   Hypothyroidism (Chronic)    Chronic, ongoing.  Continue current medication regimen and adjust as needed.  Return for blood work as scheduled.         Other Visit Diagnoses    Annual physical exam    -  Primary        Discussed aspirin prophylaxis for myocardial infarction prevention and decision was it was not indicated  LABORATORY TESTING:  Health maintenance labs ordered today as discussed above.   The natural history of prostate cancer and ongoing controversy regarding screening and potential treatment outcomes of prostate cancer has been discussed with the patient. The meaning of a false positive PSA and a false negative PSA has been discussed. He indicates understanding of the limitations of this screening test and wishes to proceed with screening PSA testing.   IMMUNIZATIONS:   - Tdap: Tetanus vaccination status reviewed: wishes to get next visit - Influenza: up to date - Pneumovax: Up to date - Prevnar: Up to date - Zostavax vaccine: Up to date  SCREENING: - Colonoscopy: Up to date  Discussed with patient purpose of the colonoscopy is to detect colon cancer at curable precancerous or early stages   - AAA Screening: Not applicable  -Hearing Test: Not applicable  -Spirometry: Not applicable   PATIENT COUNSELING:    Sexuality: Discussed sexually transmitted diseases, partner selection, use of condoms, avoidance of unintended pregnancy  and contraceptive alternatives.   Advised to avoid cigarette smoking.  I discussed with the patient that most people either abstain from alcohol or drink within safe limits (<=14/week and <=4 drinks/occasion for males, <=7/weeks and <= 3 drinks/occasion for females) and that the risk for alcohol disorders and other health effects rises proportionally with the number of drinks per week and how often a drinker exceeds daily limits.  Discussed cessation/primary prevention of drug use and availability of treatment for abuse.   Diet:  Encouraged to adjust caloric intake to maintain  or achieve ideal body weight, to reduce intake of dietary saturated fat and total fat, to limit sodium intake by avoiding high sodium foods and not adding table salt, and to  maintain adequate dietary potassium and calcium preferably from fresh fruits, vegetables, and low-fat dairy products.    stressed the importance of regular exercise  Injury prevention: Discussed safety belts, safety helmets, smoke detector, smoking near bedding or upholstery.   Dental health: Discussed importance of regular tooth brushing, flossing, and dental visits.   Follow up plan: NEXT PREVENTATIVE PHYSICAL DUE IN 1 YEAR. Return in about 6 months (around 04/23/2019) for Follow-up.

## 2018-10-21 NOTE — Assessment & Plan Note (Signed)
Continue use of CPAP. 

## 2018-12-02 ENCOUNTER — Telehealth: Payer: Self-pay | Admitting: Cardiovascular Disease

## 2018-12-04 ENCOUNTER — Ambulatory Visit (INDEPENDENT_AMBULATORY_CARE_PROVIDER_SITE_OTHER): Payer: PPO

## 2018-12-04 ENCOUNTER — Other Ambulatory Visit: Payer: Self-pay

## 2018-12-04 ENCOUNTER — Other Ambulatory Visit: Payer: PPO

## 2018-12-04 DIAGNOSIS — E039 Hypothyroidism, unspecified: Secondary | ICD-10-CM

## 2018-12-04 DIAGNOSIS — Z23 Encounter for immunization: Secondary | ICD-10-CM

## 2018-12-05 ENCOUNTER — Encounter: Payer: Self-pay | Admitting: Family Medicine

## 2018-12-05 LAB — TSH: TSH: 3.61 u[IU]/mL (ref 0.450–4.500)

## 2018-12-22 NOTE — Progress Notes (Signed)
Virtual Visit via Telephone Note   This visit type was conducted due to national recommendations for restrictions regarding the COVID-19 Pandemic (e.g. social distancing) in an effort to limit this patient's exposure and mitigate transmission in our community.  Due to her co-morbid illnesses, this patient is at least at moderate risk for complications without adequate follow up.  This format is felt to be most appropriate for this patient at this time.  The patient did not have access to video technology/had technical difficulties with video requiring transitioning to audio format only (telephone).  All issues noted in this document were discussed and addressed.  No physical exam could be performed with this format.  Please refer to the patient's chart for her  consent to telehealth for [facility].    Date:  12/22/2018   ID:  [name] [name], DOB [birth year], MRN [id number]  Patient Location:  [place] [address]   Provider location:   [facility], [place] office  PCP:  Guadalupe Maple, MD  Cardiologist:  Patsy Baltimore  Chief Complaint:  Atrial fib, palpitations, OSA   History of Present Illness:    [name] [name] is a 73 y.o. male who presents via audio/video conferencing for a telehealth visit today.   The patient does not symptoms concerning for COVID-19 infection (fever, chills, cough, or new SHORTNESS OF BREATH).   Patient has a past medical history of Afib with RVR with heart rates in the 180's in the setting of hypokalemia and colonoscopy prep,  HTN,  hypothyroidism,  sleep apnea on cpap prostate cancer, previous cardioversion for Persistent atrial fibrillation Sleep apnea, wears his CPAP, avg about 3 hours then gets nasal congestion No diabetes, goof cholesterol He was started on Eliquis given his CHADS2VASc of 2 (HTN and age x 1).  He presents for routine follow-up of his paroxysmal atrial fibrillation  Typically with no  sx when in atrial fib Takes amio 200 daily  BP: 135/80 Sometimes diastolic 90 Pulse 0000000  Weight down 230 to 218 Intentional weight loss  Doing garden, house chores  Total chol 153, LDL 94  Denies any significant shortness of breath or chest pain No regular exercise program, good activity level  Prior CV studies:   The following studies were reviewed today:  Previous echocardiogram ECHO EF 50 to 55%, 04/18/2015  Prior clinic visit 05/30/16 he reported being in atrial fibrillation for several days 05/26/16, elevated HR at home on his measurements  flushedrecently, does not feel as well when playing racquetball Wears CPAP, number of apnea events up to 15, previously 5 to 9 overnight  Heart Rate 123 bpm on EKG  compliant on his anticoagulation Take eliquis 5 mg twice a day He has been taking metoprolol 50 mg twice a day   previously on benazepril, stopped for low blood pressure  He did have episode of hematuria May 2017 Had a Cystoscopy. No significant pathology per the patient No more hematuria since then   Previous CT scan ABD showing no CAD, no aorta disease or iliac vessel    bowel prep for a routine screening colonoscopy which he was at [facility] as an outpatient for on 04/18/15. he was noted to be in new onset Afib with RVR with HR in the 180's  He was completely asymptomatic.   Echo showed EF 50-55%, no RWMA, not sufficient to allow for LV diastolic function, mildly dilated left atrium at 46 mm, mild MR, normal RV systolic function, and  normal PASP.   remote smoking history when he was in his 4s and 29s,   Past Medical History:  Diagnosis Date  . A-fib ([place])   . Atrial fibrillation with RVR ([place])   . Dupuytren's contracture   . Essential hypertension 09/05/2014  . Hypertension   . Hypokalemia   . Hypothyroidism   . Malignant neoplasm of prostate ([place]) 09/05/2014   prostate,skin of the scalp  . Mitral regurgitation    a. echo 03/2015: echo 03/2015: EF  50-55%, no RWMA, no sufficienct to allow for LV diastolic fxn, mild MR, LA mildly dilated at 46 mm, RV systolic fxn nl, PASP nl  . Nephrolithiasis   . New onset atrial fibrillation ([place]) 04/18/2015  . OSA (obstructive sleep apnea) 05/11/2015  . Persistent atrial fibrillation    Past Surgical History:  Procedure Laterality Date  . COLONOSCOPY WITH PROPOFOL N/A 04/18/2015   Procedure: COLONOSCOPY WITH PROPOFOL;  Surgeon: Lucilla Lame, MD;  Location: [facility] ENDOSCOPY;  Service: Endoscopy;  Laterality: N/A;  . ELECTROPHYSIOLOGIC STUDY N/A 05/05/2015   Procedure: CARDIOVERSION;  Surgeon: Wellington Hampshire, MD;  Location: [facility] ORS;  Service: Cardiovascular;  Laterality: N/A;  . RADIOACTIVE SEED IMPLANT N/A      No outpatient medications have been marked as taking for the 12/23/18 encounter (Appointment) with Minna Merritts, MD.     Allergies:   Patient has no known allergies.   Social History   Tobacco Use  . Smoking status: Former Smoker    Types: Cigarettes    Quit date: 09/05/1994    Years since quitting: 24.3  . Smokeless tobacco: Former Systems developer    Types: Chew    Quit date: 09/04/2004  Substance Use Topics  . Alcohol use: No  . Drug use: No     Current Outpatient Medications on File Prior to Visit  Medication Sig Dispense Refill  . amiodarone (PACERONE) 200 MG tablet Take 1 tablet (200 mg total) by mouth daily. 90 tablet 3  . apixaban (ELIQUIS) 5 MG TABS tablet Take 1 tablet (5 mg total) by mouth 2 (two) times daily. 180 tablet 3  . levothyroxine (SYNTHROID) 75 MCG tablet Take 1 tablet (75 mcg total) by mouth daily. 90 tablet 0  . metoprolol tartrate (LOPRESSOR) 50 MG tablet Take 1 tablet (50 mg total) by mouth 2 (two) times daily. 180 tablet 3  . Multiple Vitamin (MULTIVITAMIN) tablet Take 1 tablet by mouth daily.     No current facility-administered medications on file prior to visit.      Family Hx: The patient's family history includes Heart attack in his mother; Heart disease  in his father.  ROS:   Please see the history of present illness.    Review of Systems  Constitutional: Negative.   Respiratory: Negative.   Cardiovascular: Positive for palpitations.  Gastrointestinal: Negative.   Musculoskeletal: Negative.   Neurological: Negative.   Psychiatric/Behavioral: Negative.   All other systems reviewed and are negative.     Labs/Other Tests and Data Reviewed:    Recent Labs: 09/28/2018: ALT 16; BUN 12; Creatinine, Ser 1.12; Hemoglobin 15.0; Platelets 143; Potassium 4.4; Sodium 145 12/04/2018: TSH 3.610   Recent Lipid Panel Lab Results  Component Value Date/Time   CHOL 153 09/28/2018 08:46 AM   TRIG 50 09/28/2018 08:46 AM   HDL 49 09/28/2018 08:46 AM   CHOLHDL 2.7 09/24/2017 10:24 AM   LDLCALC 94 09/28/2018 08:46 AM    Wt Readings from Last 3 Encounters:  10/21/18 216 lb 3.2 oz (  98.1 kg)  09/28/18 218 lb (98.9 kg)  03/31/18 242 lb 6.4 oz (110 kg)     Exam:    Vital Signs: Vital signs may also be detailed in the HPI There were no vitals taken for this visit.  Wt Readings from Last 3 Encounters:  10/21/18 216 lb 3.2 oz (98.1 kg)  09/28/18 218 lb (98.9 kg)  03/31/18 242 lb 6.4 oz (110 kg)   Temp Readings from Last 3 Encounters:  10/21/18 98.1 F (36.7 C) (Oral)  09/28/18 98 F (36.7 C) (Temporal)  03/31/18 98.1 F (36.7 C) (Oral)   BP Readings from Last 3 Encounters:  10/21/18 (!) 142/88  09/28/18 134/72  03/31/18 (!) 170/99   Pulse Readings from Last 3 Encounters:  10/21/18 62  09/28/18 (!) 53  03/31/18 64     Well nourished, well developed male in no acute distress. Constitutional:  oriented to person, place, and time. No distress.    ASSESSMENT & PLAN:    Persistent atrial fibrillation NSR by his hx, same on same medications Normal TSH last month  Essential hypertension Blood pressure is well controlled on today's visit. No changes made to the medications.  OSA (obstructive sleep apnea) Wears CPAP on a regular  basis Lots of nasal congestion, has to take off the mask after a few hours.  Discussed with him, recommended he try Zyrtec, possibly Flonase nasal spray.  We will discuss with pulmonary whether they think pressures are too high.  By his account there is a lag time of 45 minutes before pressures kick in every night when he wears it.  COVID-19 Education: The signs and symptoms of COVID-19 were discussed with the patient and how to seek care for testing (follow up with PCP or arrange E-visit).  The importance of social distancing was discussed today.  Patient Risk:   After full review of this patients clinical status, I feel that they are at least moderate risk at this time.  Time:   Today, I have spent 25 minutes with the patient with telehealth technology discussing the cardiac and medical problems/diagnoses detailed above    Medication Adjustments/Labs and Tests Ordered: Current medicines are reviewed at length with the patient today.  Concerns regarding medicines are outlined above.   Tests Ordered: No tests ordered   Medication Changes: No changes made   Disposition: Follow-up in 12 months   Signed, Ida Rogue, MD  12/22/2018 7:54 AM    [place] [facility] [address], [place], [place] [address]

## 2018-12-23 ENCOUNTER — Telehealth (INDEPENDENT_AMBULATORY_CARE_PROVIDER_SITE_OTHER): Payer: PPO | Admitting: Cardiovascular Disease

## 2018-12-23 ENCOUNTER — Other Ambulatory Visit: Payer: Self-pay

## 2018-12-23 VITALS — BP 129/93 | HR 67 | Wt 216.0 lb

## 2018-12-23 DIAGNOSIS — I10 Essential (primary) hypertension: Secondary | ICD-10-CM

## 2018-12-23 DIAGNOSIS — I4819 Other persistent atrial fibrillation: Secondary | ICD-10-CM | POA: Diagnosis not present

## 2018-12-23 DIAGNOSIS — E039 Hypothyroidism, unspecified: Secondary | ICD-10-CM | POA: Diagnosis not present

## 2018-12-23 DIAGNOSIS — G4733 Obstructive sleep apnea (adult) (pediatric): Secondary | ICD-10-CM

## 2018-12-23 NOTE — Patient Instructions (Addendum)
Medication Instructions:  No changes  If you need a refill on your cardiac medications before your next appointment, please call your pharmacy.    Lab work: No new labs needed   If you have labs (blood work) drawn today and your tests are completely normal, you will receive your results only by: Marland Kitchen MyChart Message (if you have MyChart) OR . A paper copy in the mail If you have any lab test that is abnormal or we need to change your treatment, we will call you to review the results.   Testing/Procedures: No new testing needed   Follow-Up: At [facility], you and your health needs are our priority.  As part of our continuing mission to provide you with exceptional heart care, we have created designated Provider Care Teams.  These Care Teams include your primary Cardiologist (physician) and Advanced Practice Providers (APPs -  Physician Assistants and Nurse Practitioners) who all work together to provide you with the care you need, when you need it.  . You will need a follow up appointment in 12 months (October 2021) .   Please call our office 2 months in advance to schedule this appointment.  (Call in early August 2021 to schedule)  . Providers on your designated Care Team:   . Murray Hodgkins, NP . Christell Faith, PA-C . Marrianne Mood, PA-C  Any Other Special Instructions Will Be Listed Below (If Applicable).  For educational health videos Log in to : [email or web address] Or : [email or web address], password : [email or web address]

## 2018-12-28 ENCOUNTER — Other Ambulatory Visit: Payer: Self-pay | Admitting: Family Medicine

## 2019-02-09 ENCOUNTER — Other Ambulatory Visit: Payer: Self-pay

## 2019-03-01 DIAGNOSIS — D485 Neoplasm of uncertain behavior of skin: Secondary | ICD-10-CM | POA: Diagnosis not present

## 2019-03-01 DIAGNOSIS — D2261 Melanocytic nevi of right upper limb, including shoulder: Secondary | ICD-10-CM | POA: Diagnosis not present

## 2019-03-01 DIAGNOSIS — D044 Carcinoma in situ of skin of scalp and neck: Secondary | ICD-10-CM | POA: Diagnosis not present

## 2019-03-01 DIAGNOSIS — D2271 Melanocytic nevi of right lower limb, including hip: Secondary | ICD-10-CM | POA: Diagnosis not present

## 2019-03-01 DIAGNOSIS — D0439 Carcinoma in situ of skin of other parts of face: Secondary | ICD-10-CM | POA: Diagnosis not present

## 2019-03-01 DIAGNOSIS — Z85828 Personal history of other malignant neoplasm of skin: Secondary | ICD-10-CM | POA: Diagnosis not present

## 2019-03-01 DIAGNOSIS — X32XXXA Exposure to sunlight, initial encounter: Secondary | ICD-10-CM | POA: Diagnosis not present

## 2019-03-01 DIAGNOSIS — D225 Melanocytic nevi of trunk: Secondary | ICD-10-CM | POA: Diagnosis not present

## 2019-03-01 DIAGNOSIS — L570 Actinic keratosis: Secondary | ICD-10-CM | POA: Diagnosis not present

## 2019-03-01 DIAGNOSIS — D2262 Melanocytic nevi of left upper limb, including shoulder: Secondary | ICD-10-CM | POA: Diagnosis not present

## 2019-03-30 ENCOUNTER — Other Ambulatory Visit: Payer: Self-pay

## 2019-03-30 MED ORDER — LEVOTHYROXINE SODIUM 75 MCG PO TABS: 75.0000 ug | ORAL_TABLET | Freq: Every day | ORAL | 0 refills | Status: DC

## 2019-03-30 NOTE — Telephone Encounter (Signed)
Last seen 10/21/2018 with Jolene.

## 2019-04-07 ENCOUNTER — Ambulatory Visit: Payer: Self-pay | Admitting: Family Medicine

## 2019-04-09 DIAGNOSIS — D0439 Carcinoma in situ of skin of other parts of face: Secondary | ICD-10-CM | POA: Diagnosis not present

## 2019-04-16 DIAGNOSIS — D044 Carcinoma in situ of skin of scalp and neck: Secondary | ICD-10-CM | POA: Diagnosis not present

## 2019-05-13 NOTE — Telephone Encounter (Signed)

## 2019-06-23 ENCOUNTER — Other Ambulatory Visit: Payer: Self-pay | Admitting: Nurse Practitioner

## 2019-07-05 ENCOUNTER — Other Ambulatory Visit: Payer: Self-pay | Admitting: Cardiovascular Disease

## 2019-07-05 NOTE — Telephone Encounter (Signed)
Refill request for Eliquis

## 2019-08-24 DIAGNOSIS — Z85828 Personal history of other malignant neoplasm of skin: Secondary | ICD-10-CM | POA: Diagnosis not present

## 2019-08-24 DIAGNOSIS — C4442 Squamous cell carcinoma of skin of scalp and neck: Secondary | ICD-10-CM | POA: Diagnosis not present

## 2019-08-24 DIAGNOSIS — L821 Other seborrheic keratosis: Secondary | ICD-10-CM | POA: Diagnosis not present

## 2019-08-24 DIAGNOSIS — D2271 Melanocytic nevi of right lower limb, including hip: Secondary | ICD-10-CM | POA: Diagnosis not present

## 2019-08-24 DIAGNOSIS — R208 Other disturbances of skin sensation: Secondary | ICD-10-CM | POA: Diagnosis not present

## 2019-08-24 DIAGNOSIS — D485 Neoplasm of uncertain behavior of skin: Secondary | ICD-10-CM | POA: Diagnosis not present

## 2019-08-24 DIAGNOSIS — L570 Actinic keratosis: Secondary | ICD-10-CM | POA: Diagnosis not present

## 2019-08-24 DIAGNOSIS — D225 Melanocytic nevi of trunk: Secondary | ICD-10-CM | POA: Diagnosis not present

## 2019-08-24 DIAGNOSIS — X32XXXA Exposure to sunlight, initial encounter: Secondary | ICD-10-CM | POA: Diagnosis not present

## 2019-08-24 DIAGNOSIS — D2262 Melanocytic nevi of left upper limb, including shoulder: Secondary | ICD-10-CM | POA: Diagnosis not present

## 2019-09-09 ENCOUNTER — Telehealth: Payer: Self-pay | Admitting: Family Medicine

## 2019-09-09 NOTE — Telephone Encounter (Signed)
Copied from [place] [id number]. Topic: Medicare AWV >> Sep 09, 2019  2:17 PM Cher Nakai R wrote: Reason for CRM:  Need to reschedule AWVS from 09/27/19 to another date to do by phone due to schedule changes

## 2019-09-09 NOTE — Telephone Encounter (Signed)
Copied from [place] [id number]. Topic: Medicare AWV >> Sep 09, 2019  2:22 PM Weston Anna wrote: Reason for CRM:  Need to reschedule AWVS from September 30, 2019 to July 16,2021 [facility] or another date due to schedule changes-srs

## 2019-09-20 ENCOUNTER — Other Ambulatory Visit: Payer: Self-pay | Admitting: Cardiovascular Disease

## 2019-09-20 ENCOUNTER — Other Ambulatory Visit: Payer: Self-pay | Admitting: Nurse Practitioner

## 2019-09-28 DIAGNOSIS — C4442 Squamous cell carcinoma of skin of scalp and neck: Secondary | ICD-10-CM | POA: Diagnosis not present

## 2019-09-29 ENCOUNTER — Ambulatory Visit: Payer: PPO

## 2019-10-01 ENCOUNTER — Ambulatory Visit (INDEPENDENT_AMBULATORY_CARE_PROVIDER_SITE_OTHER): Payer: PPO

## 2019-10-01 VITALS — Ht 73.0 in | Wt 220.0 lb

## 2019-10-01 DIAGNOSIS — Z0000 Encounter for general adult medical examination without abnormal findings: Secondary | ICD-10-CM

## 2019-10-01 NOTE — Patient Instructions (Signed)
[name] [name] , Thank you for taking time to come for your Medicare Wellness Visit. I appreciate your ongoing commitment to your health goals. Please review the following plan we discussed and let me know if I can assist you in the future.   Screening recommendations/referrals: Colonoscopy: completed 04/18/2015 Recommended yearly ophthalmology/optometry visit for glaucoma screening and checkup Recommended yearly dental visit for hygiene and checkup  Vaccinations: Influenza vaccine: completed 12/04/2018, due 10/17/2019 Pneumococcal vaccine: completed 02/28/2014 Tdap vaccine: due Shingles vaccine: discussed   Covid-19: 05/07/2019, 05/28/2019  Advanced directives: Please bring a copy of your POA (Power of Attorney) and/or Living Will to your next appointment.   Conditions/risks identified: none  Next appointment: Follow up in one year for your annual wellness visit.   Preventive Care 41 Years and Older, Male Preventive care refers to lifestyle choices and visits with your health care provider that can promote health and wellness. What does preventive care include?  A yearly physical exam. This is also called an annual well check.  Dental exams once or twice a year.  Routine eye exams. Ask your health care provider how often you should have your eyes checked.  Personal lifestyle choices, including:  Daily care of your teeth and gums.  Regular physical activity.  Eating a healthy diet.  Avoiding tobacco and drug use.  Limiting alcohol use.  Practicing safe sex.  Taking low doses of aspirin every day.  Taking vitamin and mineral supplements as recommended by your health care provider. What happens during an annual well check? The services and screenings done by your health care provider during your annual well check will depend on your age, overall health, lifestyle risk factors, and family history of disease. Counseling  Your health care provider may ask you questions about  your:  Alcohol use.  Tobacco use.  Drug use.  Emotional well-being.  Home and relationship well-being.  Sexual activity.  Eating habits.  History of falls.  Memory and ability to understand (cognition).  Work and work Statistician. Screening  You may have the following tests or measurements:  Height, weight, and BMI.  Blood pressure.  Lipid and cholesterol levels. These may be checked every 5 years, or more frequently if you are over 56 years old.  Skin check.  Lung cancer screening. You may have this screening every year starting at age 54 if you have a 30-pack-year history of smoking and currently smoke or have quit within the past 15 years.  Fecal occult blood test (FOBT) of the stool. You may have this test every year starting at age 78.  Flexible sigmoidoscopy or colonoscopy. You may have a sigmoidoscopy every 5 years or a colonoscopy every 10 years starting at age 53.  Prostate cancer screening. Recommendations will vary depending on your family history and other risks.  Hepatitis C blood test.  Hepatitis B blood test.  Sexually transmitted disease (STD) testing.  Diabetes screening. This is done by checking your blood sugar (glucose) after you have not eaten for a while (fasting). You may have this done every 1-3 years.  Abdominal aortic aneurysm (AAA) screening. You may need this if you are a current or former smoker.  Osteoporosis. You may be screened starting at age 82 if you are at high risk. Talk with your health care provider about your test results, treatment options, and if necessary, the need for more tests. Vaccines  Your health care provider may recommend certain vaccines, such as:  Influenza vaccine. This is recommended every year.  Tetanus, diphtheria, and acellular pertussis (Tdap, Td) vaccine. You may need a Td booster every 10 years.  Zoster vaccine. You may need this after age 3.  Pneumococcal 13-valent conjugate (PCV13) vaccine.  One dose is recommended after age 82.  Pneumococcal polysaccharide (PPSV23) vaccine. One dose is recommended after age 60. Talk to your health care provider about which screenings and vaccines you need and how often you need them. This information is not intended to replace advice given to you by your health care provider. Make sure you discuss any questions you have with your health care provider. Document Released: 03/31/2015 Document Revised: 11/22/2015 Document Reviewed: 01/03/2015 Elsevier Interactive Patient Education  2017 [place] Prevention in the Home Falls can cause injuries. They can happen to people of all ages. There are many things you can do to make your home safe and to help prevent falls. What can I do on the outside of my home?  Regularly fix the edges of walkways and driveways and fix any cracks.  Remove anything that might make you trip as you walk through a door, such as a raised step or threshold.  Trim any bushes or trees on the path to your home.  Use bright outdoor lighting.  Clear any walking paths of anything that might make someone trip, such as rocks or tools.  Regularly check to see if handrails are loose or broken. Make sure that both sides of any steps have handrails.  Any raised decks and porches should have guardrails on the edges.  Have any leaves, snow, or ice cleared regularly.  Use sand or salt on walking paths during winter.  Clean up any spills in your garage right away. This includes oil or grease spills. What can I do in the bathroom?  Use night lights.  Install grab bars by the toilet and in the tub and shower. Do not use towel bars as grab bars.  Use non-skid mats or decals in the tub or shower.  If you need to sit down in the shower, use a plastic, non-slip stool.  Keep the floor dry. Clean up any water that spills on the floor as soon as it happens.  Remove soap buildup in the tub or shower regularly.  Attach bath  mats securely with double-sided non-slip rug tape.  Do not have throw rugs and other things on the floor that can make you trip. What can I do in the bedroom?  Use night lights.  Make sure that you have a light by your bed that is easy to reach.  Do not use any sheets or blankets that are too big for your bed. They should not hang down onto the floor.  Have a firm chair that has side arms. You can use this for support while you get dressed.  Do not have throw rugs and other things on the floor that can make you trip. What can I do in the kitchen?  Clean up any spills right away.  Avoid walking on wet floors.  Keep items that you use a lot in easy-to-reach places.  If you need to reach something above you, use a strong step stool that has a grab bar.  Keep electrical cords out of the way.  Do not use floor polish or wax that makes floors slippery. If you must use wax, use non-skid floor wax.  Do not have throw rugs and other things on the floor that can make you trip. What can I do  with my stairs?  Do not leave any items on the stairs.  Make sure that there are handrails on both sides of the stairs and use them. Fix handrails that are broken or loose. Make sure that handrails are as long as the stairways.  Check any carpeting to make sure that it is firmly attached to the stairs. Fix any carpet that is loose or worn.  Avoid having throw rugs at the top or bottom of the stairs. If you do have throw rugs, attach them to the floor with carpet tape.  Make sure that you have a light switch at the top of the stairs and the bottom of the stairs. If you do not have them, ask someone to add them for you. What else can I do to help prevent falls?  Wear shoes that:  Do not have high heels.  Have rubber bottoms.  Are comfortable and fit you well.  Are closed at the toe. Do not wear sandals.  If you use a stepladder:  Make sure that it is fully opened. Do not climb a closed  stepladder.  Make sure that both sides of the stepladder are locked into place.  Ask someone to hold it for you, if possible.  Clearly mark and make sure that you can see:  Any grab bars or handrails.  First and last steps.  Where the edge of each step is.  Use tools that help you move around (mobility aids) if they are needed. These include:  Canes.  Walkers.  Scooters.  Crutches.  Turn on the lights when you go into a dark area. Replace any light bulbs as soon as they burn out.  Set up your furniture so you have a clear path. Avoid moving your furniture around.  If any of your floors are uneven, fix them.  If there are any pets around you, be aware of where they are.  Review your medicines with your doctor. Some medicines can make you feel dizzy. This can increase your chance of falling. Ask your doctor what other things that you can do to help prevent falls. This information is not intended to replace advice given to you by your health care provider. Make sure you discuss any questions you have with your health care provider. Document Released: 12/29/2008 Document Revised: 08/10/2015 Document Reviewed: 04/08/2014 Elsevier Interactive Patient Education  2017 [employer].

## 2019-10-01 NOTE — Progress Notes (Addendum)
I connected with [name] today by telephone and verified that I am speaking with the correct person using two identifiers. Location patient: home Location provider: work Persons participating in the virtual visit: Rhonin Trott, Glenna Durand LPN.   I discussed the limitations, risks, security and privacy concerns of performing an evaluation and management service by telephone and the availability of in person appointments. I also discussed with the patient that there may be a patient responsible charge related to this service. The patient expressed understanding and verbally consented to this telephonic visit.    Interactive audio and video telecommunications were attempted between this provider and patient, however failed, due to patient having technical difficulties OR patient did not have access to video capability.  We continued and completed visit with audio only.  Vital signs may be patient reported or missing.     Subjective:   JAMORIAN DIMARIA is a 74 y.o. male who presents for Medicare Annual/Subsequent preventive examination.  Review of Systems     Cardiac Risk Factors include: advanced age (>59men, >26 women);hypertension;male gender     Objective:    Today's Vitals   10/01/19 0941  Weight: 220 lb (99.8 kg)  Height: 6\' 1"  (1.854 m)   Body mass index is 29.03 kg/m.  Advanced Directives 10/01/2019 09/28/2018 09/03/2017 08/28/2016 05/05/2015 04/18/2015 04/18/2015  Does Patient Have a Medical Advance Directive? Yes Yes Yes Yes No Yes Yes  Type of Paramedic of [place];Living will Living will;Healthcare Power of Attorney Living will;Healthcare Power of [place];Living will - Living will Living will;Healthcare Power of Attorney  Does patient want to make changes to medical advance directive? - - - - - No - Patient declined No - Patient declined  Copy of [place] in Chart? No - copy requested Yes -  validated most recent copy scanned in chart (See row information) Yes Yes No - copy requested No - copy requested No - copy requested  Would patient like information on creating a medical advance directive? - - - - No - patient declined information - -    Current Medications (verified) Outpatient Encounter Medications as of 10/01/2019  Medication Sig  . Cholecalciferol (VITAMIN D3) 1.25 MG (50000 UT) TABS Take 1 capsule by mouth daily.  Marland Kitchen ELIQUIS 5 MG TABS tablet Take 1 tablet by mouth twice daily  . EUTHYROX 75 MCG tablet Take 1 tablet by mouth once daily  . metoprolol tartrate (LOPRESSOR) 50 MG tablet Take 1 tablet by mouth twice daily  . Multiple Vitamin (MULTIVITAMIN) tablet Take 1 tablet by mouth daily.  Marland Kitchen PACERONE 200 MG tablet Take 1 tablet by mouth once daily  . Zinc 50 MG TABS Take 50 mg by mouth daily.   No facility-administered encounter medications on file as of 10/01/2019.    Allergies (verified) Patient has no known allergies.   History: Past Medical History:  Diagnosis Date  . A-fib ([place])   . Atrial fibrillation with RVR ([place])   . Dupuytren's contracture   . Essential hypertension 09/05/2014  . Hypertension   . Hypokalemia   . Hypothyroidism   . 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 Mitral regurgitation    a. echo 03/2015: echo 03/2015: EF 50-55%, no RWMA, no sufficienct to allow for LV diastolic fxn, mild MR, LA mildly dilated at 46 mm, RV systolic fxn nl, PASP nl  . Nephrolithiasis   . New onset atrial fibrillation ([place])  04/18/2015  . OSA (obstructive sleep apnea) 05/11/2015  . Persistent atrial fibrillation [facility])    Past Surgical History:  Procedure Laterality Date  . COLONOSCOPY WITH PROPOFOL N/A 04/18/2015   Procedure: COLONOSCOPY WITH PROPOFOL;  Surgeon: Lucilla Lame, MD;  Location: [facility] ENDOSCOPY;  Service: Endoscopy;  Laterality: N/A;  . ELECTROPHYSIOLOGIC STUDY N/A 05/05/2015   Procedure: CARDIOVERSION;  Surgeon:  Wellington Hampshire, MD;  Location: [facility] ORS;  Service: Cardiovascular;  Laterality: N/A;  . RADIOACTIVE SEED IMPLANT N/A    Family History  Problem Relation Age of Onset  . Heart attack Mother   . Heart disease Father    Social History   Socioeconomic History  . Marital status: Married    Spouse name: Not on file  . Number of children: Not on file  . Years of education: Not on file  . Highest education level: Associate degree: academic program  Occupational History  . Occupation: retired   Tobacco Use  . Smoking status: Former Smoker    Types: Cigarettes    Quit date: 09/05/1994    Years since quitting: 25.0  . Smokeless tobacco: Former Systems developer    Types: Chew    Quit date: 09/04/2004  Vaping Use  . Vaping Use: Never used  Substance and Sexual Activity  . Alcohol use: No  . Drug use: No  . Sexual activity: Not Currently  Other Topics Concern  . Not on file  Social History Narrative   Plays racket ball twice a week, works outside    Scientist, physiological Strain: [place]   . Difficulty of Paying Living Expenses: Not hard at all  Food Insecurity: No Food Insecurity  . Worried About Charity fundraiser in the Last Year: Never true  . Ran Out of Food in the Last Year: Never true  Transportation Needs: No Transportation Needs  . Lack of Transportation (Medical): No  . Lack of Transportation (Non-Medical): No  Physical Activity: Insufficiently Active  . Days of Exercise per Week: 2 days  . Minutes of Exercise per Session: 60 min  Stress: No Stress Concern Present  . Feeling of Stress : Not at all  Social Connections:   . Frequency of Communication with Friends and Family:   . Frequency of Social Gatherings with Friends and Family:   . Attends Religious Services:   . Active Member of Clubs or Organizations:   . Attends Archivist Meetings:   Marland Kitchen Marital Status:     Tobacco Counseling Counseling given: Not Answered   Clinical  Intake:  Pre-visit preparation completed: Yes  Pain : No/denies pain     Nutritional Status: BMI 25 -29 Overweight Nutritional Risks: None Diabetes: No  How often do you need to have someone help you when you read instructions, pamphlets, or other written materials from your doctor or pharmacy?: 1 - Never What is the last grade level you completed in school?: college  Diabetic? no  Interpreter Needed?: No  Information entered by :: NAllen LPN   Activities of Daily Living In your present state of health, do you have any difficulty performing the following activities: 10/01/2019 10/21/2018  Hearing? N N  Vision? N N  Difficulty concentrating or making decisions? N N  Walking or climbing stairs? N N  Dressing or bathing? N N  Doing errands, shopping? N N  Preparing Food and eating ? N -  Using the Toilet? N -  In the past six months, have you  accidently leaked urine? N -  Do you have problems with loss of bowel control? N -  Managing your Medications? N -  Managing your Finances? N -  Housekeeping or managing your Housekeeping? N -  Some recent data might be hidden    Patient Care Team: Guadalupe Maple, MD as PCP - General (Family Medicine) Guadalupe Maple, MD as PCP - Family Medicine (Family Medicine) Lucilla Lame, MD as Consulting Physician (Gastroenterology) Minna Merritts, MD as Consulting Physician (Cardiology) Dasher, Rayvon Char, MD as Consulting Physician (Dermatology)  Indicate any recent Medical Services you may have received from other than [facility] providers in the past year (date may be approximate).     Assessment:   This is a routine wellness examination for Eluterio.  Hearing/Vision screen  Hearing Screening   125Hz  250Hz  500Hz  1000Hz  2000Hz  3000Hz  4000Hz  6000Hz  8000Hz   Right ear:           Left ear:           Vision Screening Comments: No regular eye exams, Dr. Joya San  Dietary issues and exercise activities discussed: Current Exercise Habits: Home  exercise routine, Type of exercise: Other - see comments (yardwork, hunting and fishing), Time (Minutes): 60, Frequency (Times/Week): 2, Weekly Exercise (Minutes/Week): 120  Goals    . DIET - INCREASE WATER INTAKE     Recommend drinking at least 6-8 glasses of water a day     . Increase water intake     Recommend drinking at least 3-4 glasses of water a day     . Patient Stated     10/01/2019, wants to lose 5 pounds      Depression Screen PHQ 2/9 Scores 10/01/2019 09/28/2018 09/24/2017 09/03/2017 09/12/2016 08/28/2016 09/07/2015  PHQ - 2 Score 0 0 0 0 0 0 0  PHQ- 9 Score 0 0 - - - - -    Fall Risk Fall Risk  10/01/2019 02/09/2019 09/28/2018 09/24/2017 09/03/2017  Falls in the past year? 0 0 0 No No  Comment - Emmi Telephone Survey: data to providers prior to load - - -  Risk for fall due to : Medication side effect - - - -  Follow up Falls evaluation completed;Education provided;Falls prevention discussed - - - -    Any stairs in or around the home? Yes  If so, are there any without handrails? No  Home free of loose throw rugs in walkways, pet beds, electrical cords, etc? Yes  Adequate lighting in your home to reduce risk of falls? Yes   ASSISTIVE DEVICES UTILIZED TO PREVENT FALLS:  Life alert? No  Use of a cane, walker or w/c? No  Grab bars in the bathroom? No  Shower chair or bench in shower? No  Elevated toilet seat or a handicapped toilet? No   TIMED UP AND GO:  Was the test performed? No .    Cognitive Function:     6CIT Screen 10/01/2019 10/21/2018 09/28/2018 09/03/2017 08/28/2016  What Year? 0 points 0 points 0 points 0 points 0 points  What month? 0 points 0 points 0 points 0 points 0 points  What time? 0 points 0 points 0 points 0 points 0 points  Count back from 20 0 points 0 points 0 points 0 points 0 points  Months in reverse 2 points 0 points 0 points 0 points 0 points  Repeat phrase 0 points 0 points 0 points 0 points 0 points  Total Score 2 0 0 0 0  Immunizations Immunization History  Administered Date(s) Administered  . Fluad Quad(high Dose 65+) 12/04/2018  . Influenza, High Dose Seasonal PF 03/07/2016, 03/26/2017, 03/31/2018  . Influenza,inj,Quad PF,6+ Mos 03/07/2015  . PFIZER SARS-COV-2 Vaccination 05/07/2019, 05/28/2019  . Pneumococcal Conjugate-13 02/28/2014  . Pneumococcal Polysaccharide-23 08/02/2009  . Tdap 07/28/2008  . Zoster 08/14/2010    TDAP status: Due, Education has been provided regarding the importance of this vaccine. Advised may receive this vaccine at local pharmacy or Health Dept. Aware to provide a copy of the vaccination record if obtained from local pharmacy or Health Dept. Verbalized acceptance and understanding. Flu Vaccine status: Up to date Pneumococcal vaccine status: Up to date Covid-19 vaccine status: Completed vaccines  Qualifies for Shingles Vaccine? Yes   Zostavax completed Yes   Shingrix Completed?: No.    Education has been provided regarding the importance of this vaccine. Patient has been advised to call insurance company to determine out of pocket expense if they have not yet received this vaccine. Advised may also receive vaccine at local pharmacy or Health Dept. Verbalized acceptance and understanding.  Screening Tests Health Maintenance  Topic Date Due  . TETANUS/TDAP  07/29/2018  . INFLUENZA VACCINE  10/17/2019  . COLONOSCOPY  04/17/2025  . COVID-19 Vaccine  Completed  . Hepatitis C Screening  Completed    Health Maintenance  Health Maintenance Due  Topic Date Due  . TETANUS/TDAP  07/29/2018    Colorectal cancer screening: Completed 04/18/2015. Repeat every 10 years  Lung Cancer Screening: (Low Dose CT Chest recommended if Age 49-80 years, 30 pack-year currently smoking OR have quit w/in 15years.) does not qualify.   Lung Cancer Screening Referral: no  Additional Screening:  Hepatitis C Screening: does qualify; Completed 09/24/2017  Vision Screening: Recommended  annual ophthalmology exams for early detection of glaucoma and other disorders of the eye. Is the patient up to date with their annual eye exam?  No  Who is the provider or what is the name of the office in which the patient attends annual eye exams? Dr. Joya San If pt is not established with a provider, would they like to be referred to a provider to establish care? No .   Dental Screening: Recommended annual dental exams for proper oral hygiene  Community Resource Referral / Chronic Care Management: CRR required this visit?  No   CCM required this visit?  No      Plan:     I have personally reviewed and noted the following in the patient's chart:   . Medical and social history . Use of alcohol, tobacco or illicit drugs  . Current medications and supplements . Functional ability and status . Nutritional status . Physical activity . Advanced directives . List of other physicians . Hospitalizations, surgeries, and ER visits in previous 12 months . Vitals . Screenings to include cognitive, depression, and falls . Referrals and appointments  In addition, I have reviewed and discussed with patient certain preventive protocols, quality metrics, and best practice recommendations. A written personalized care plan for preventive services as well as general preventive health recommendations were provided to patient.   Due to this being a telephonic visit, the after visit summary with patients personalized plan was offered to patient via mail or my-chart.Patient would like to access on my-chart.  Kellie Simmering, LPN   09/25/6267   Nurse Notes: Due for TDAP

## 2019-10-04 ENCOUNTER — Other Ambulatory Visit: Payer: Self-pay | Admitting: Cardiovascular Disease

## 2019-10-04 NOTE — Telephone Encounter (Signed)
Please review for refill, Thanks !  

## 2019-10-05 ENCOUNTER — Other Ambulatory Visit: Payer: Self-pay

## 2019-10-05 ENCOUNTER — Encounter: Payer: Self-pay | Admitting: Nurse Practitioner

## 2019-10-05 ENCOUNTER — Ambulatory Visit (INDEPENDENT_AMBULATORY_CARE_PROVIDER_SITE_OTHER): Payer: PPO | Admitting: Nurse Practitioner

## 2019-10-05 VITALS — BP 130/72 | HR 59 | Temp 98.4°F | Wt 224.6 lb

## 2019-10-05 DIAGNOSIS — I10 Essential (primary) hypertension: Secondary | ICD-10-CM | POA: Diagnosis not present

## 2019-10-05 DIAGNOSIS — I4891 Unspecified atrial fibrillation: Secondary | ICD-10-CM | POA: Diagnosis not present

## 2019-10-05 DIAGNOSIS — G4733 Obstructive sleep apnea (adult) (pediatric): Secondary | ICD-10-CM | POA: Diagnosis not present

## 2019-10-05 DIAGNOSIS — E785 Hyperlipidemia, unspecified: Secondary | ICD-10-CM

## 2019-10-05 DIAGNOSIS — E039 Hypothyroidism, unspecified: Secondary | ICD-10-CM | POA: Diagnosis not present

## 2019-10-05 DIAGNOSIS — E669 Obesity, unspecified: Secondary | ICD-10-CM | POA: Insufficient documentation

## 2019-10-05 DIAGNOSIS — Z6829 Body mass index (BMI) 29.0-29.9, adult: Secondary | ICD-10-CM | POA: Diagnosis not present

## 2019-10-05 NOTE — Assessment & Plan Note (Signed)
BMI 29.63 today.  Recommended eating smaller high protein, low fat meals more frequently and exercising 30 mins a day 5 times a week with a goal of 10-15lb weight loss in the next 3 months. Patient voiced their understanding and motivation to adhere to these recommendations.

## 2019-10-05 NOTE — Assessment & Plan Note (Signed)
Continue use of CPAP. 

## 2019-10-05 NOTE — Assessment & Plan Note (Addendum)
No current statin, discussed at length current levels and ASCVD 25.2%.  He wishes to further discuss with cardiology at next visit.  If statin initiated would need to be low dose and assess closely as takes Amiodarone.  Lipid check today.

## 2019-10-05 NOTE — Assessment & Plan Note (Signed)
Chronic, ongoing with initial elevation in BP, but repeat at and home readings almost consistently <130/80.  Continue current medication regimen and adjust as needed.  Recommend checking BP three mornings a week and documenting + focus on DASH diet.  Continue collaboration with cardiology.  BMP today.  Return in 6 months.

## 2019-10-05 NOTE — Patient Instructions (Addendum)
Atorvastatin tablets What is this medicine? ATORVASTATIN (a TORE va sta tin) is known as a HMG-CoA reductase inhibitor or 'statin'. It lowers the level of cholesterol and triglycerides in the blood. This drug may also reduce the risk of heart attack, stroke, or other health problems in patients with risk factors for heart disease. Diet and lifestyle changes are often used with this drug. This medicine may be used for other purposes; ask your health care provider or pharmacist if you have questions. COMMON BRAND NAME(S): Lipitor What should I tell my health care provider before I take this medicine? They need to know if you have any of these conditions:  diabetes  if you often drink alcohol  history of stroke  kidney disease  liver disease  muscle aches or weakness  thyroid disease  an unusual or allergic reaction to atorvastatin, other medicines, foods, dyes, or preservatives  pregnant or trying to get pregnant  breast-feeding How should I use this medicine? Take this medicine by mouth with a glass of water. Follow the directions on the prescription label. You can take it with or without food. If it upsets your stomach, take it with food. Do not take with grapefruit juice. Take your medicine at regular intervals. Do not take it more often than directed. Do not stop taking except on your doctor's advice. Talk to your pediatrician regarding the use of this medicine in children. While this drug may be prescribed for children as young as 10 for selected conditions, precautions do apply. Overdosage: If you think you have taken too much of this medicine contact a poison control center or emergency room at once. NOTE: This medicine is only for you. Do not share this medicine with others. What if I miss a dose? If you miss a dose, take it as soon as you can. If your next dose is to be taken in less than 12 hours, then do not take the missed dose. Take the next dose at your regular time. Do  not take double or extra doses. What may interact with this medicine? Do not take this medicine with any of the following medications:  dasabuvir; ombitasvir; paritaprevir; ritonavir  ombitasvir; paritaprevir; ritonavir  posaconazole  red yeast rice This medicine may also interact with the following medications:  alcohol  birth control pills  certain antibiotics like erythromycin and clarithromycin  certain antivirals for HIV or hepatitis  certain medicines for cholesterol like fenofibrate, gemfibrozil, and niacin  certain medicines for fungal infections like ketoconazole and itraconazole  colchicine  cyclosporine  digoxin  grapefruit juice  rifampin This list may not describe all possible interactions. Give your health care provider a list of all the medicines, herbs, non-prescription drugs, or dietary supplements you use. Also tell them if you smoke, drink alcohol, or use illegal drugs. Some items may interact with your medicine. What should I watch for while using this medicine? Visit your doctor or health care professional for regular check-ups. You may need regular tests to make sure your liver is working properly. Your health care professional may tell you to stop taking this medicine if you develop muscle problems. If your muscle problems do not go away after stopping this medicine, contact your health care professional. Do not become pregnant while taking this medicine. Women should inform their health care professional if they wish to become pregnant or think they might be pregnant. There is a potential for serious side effects to an unborn child. Talk to your health care professional or  pharmacist for more information. Do not breast-feed an infant while taking this medicine. This medicine may increase blood sugar. Ask your healthcare provider if changes in diet or medicines are needed if you have diabetes. If you are going to need surgery or other procedure, tell  your doctor that you are using this medicine. This drug is only part of a total heart-health program. Your doctor or a dietician can suggest a low-cholesterol and low-fat diet to help. Avoid alcohol and smoking, and keep a proper exercise schedule. This medicine may cause a decrease in Co-Enzyme Q-10. You should make sure that you get enough Co-Enzyme Q-10 while you are taking this medicine. Discuss the foods you eat and the vitamins you take with your health care professional. What side effects may I notice from receiving this medicine? Side effects that you should report to your doctor or health care professional as soon as possible:  allergic reactions like skin rash, itching or hives, swelling of the face, lips, or tongue  fever  joint pain  loss of memory  redness, blistering, peeling or loosening of the skin, including inside the mouth  signs and symptoms of high blood sugar such as being more thirsty or hungry or having to urinate more than normal. You may also feel very tired or have blurry vision.  signs and symptoms of liver injury like dark yellow or brown urine; general ill feeling or flu-like symptoms; light-belly pain; unusually weak or tired; yellowing of the eyes or skin  signs and symptoms of muscle injury like dark urine; trouble passing urine or change in the amount of urine; unusually weak or tired; muscle pain or side or back pain Side effects that usually do not require medical attention (report to your doctor or health care professional if they continue or are bothersome):  diarrhea  nausea  stomach pain  trouble sleeping  upset stomach This list may not describe all possible side effects. Call your doctor for medical advice about side effects. You may report side effects to FDA at 1-800-FDA-1088. Where should I keep my medicine? Keep out of the reach of children. Store between 20 and 25 degrees C (68 and 77 degrees F). Throw away any unused medicine after  the expiration date. NOTE: This sheet is a summary. It may not cover all possible information. If you have questions about this medicine, talk to your doctor, pharmacist, or health care provider.  2020 Elsevier/Gold Standard (2017-12-24 11:36:16)   Preventing High Cholesterol Cholesterol is a white, waxy substance similar to fat that the human body needs to help build cells. The liver makes all the cholesterol that a person's body needs. Having high cholesterol (hypercholesterolemia) increases a person's risk for heart disease and stroke. Extra (excess) cholesterol comes from the food the person eats. High cholesterol can often be prevented with diet and lifestyle changes. If you already have high cholesterol, you can control it with diet and lifestyle changes and with medicine. How can high cholesterol affect me? If you have high cholesterol, deposits (plaques) may build up on the walls of your arteries. The arteries are the blood vessels that carry blood away from your heart. Plaques make the arteries narrower and stiffer. This can limit or block blood flow and cause blood clots to form. Blood clots:  Are tiny balls of cells that form in your blood.  Can move to the heart or brain, causing a heart attack or stroke. Plaques in arteries greatly increase your risk for heart attack and  stroke.Making diet and lifestyle changes can reduce your risk for these conditions that may threaten your life. What can increase my risk? This condition is more likely to develop in people who:  Eat foods that are high in saturated fat or cholesterol. Saturated fat is mostly found in: ? Foods that contain animal fat, such as red meat and some dairy products. ? Certain fatty foods made from plants, such as tropical oils.  Are overweight.  Are not getting enough exercise.  Have a family history of high cholesterol. What actions can I take to prevent this? Nutrition   Eat less saturated fat.  Avoid trans  fats (partially hydrogenated oils). These are often found in margarine and in some baked goods, fried foods, and snacks bought in packages.  Avoid precooked or cured meat, such as sausages or meat loaves.  Avoid foods and drinks that have added sugars.  Eat more fruits, vegetables, and whole grains.  Choose healthy sources of protein, such as fish, poultry, lean cuts of red meat, beans, peas, lentils, and nuts.  Choose healthy sources of fat, such as: ? Nuts. ? Vegetable oils, especially olive oil. ? Fish that have healthy fats (omega-3 fatty acids), such as mackerel or salmon. The items listed above may not be a complete list of recommended foods and beverages. Contact a dietitian for more information. Lifestyle  Lose weight if you are overweight. Losing 5-10 lb (2.3-4.5 kg) can help prevent or control high cholesterol. It can also lower your risk for diabetes and high blood pressure. Ask your health care provider to help you with a diet and exercise plan to lose weight safely.  Do not use any products that contain nicotine or tobacco, such as cigarettes, e-cigarettes, and chewing tobacco. If you need help quitting, ask your health care provider.  Limit your alcohol intake. ? Do not drink alcohol if:  Your health care provider tells you not to drink.  You are pregnant, may be pregnant, or are planning to become pregnant. ? If you drink alcohol:  Limit how much you use to:  0-1 drink a day for women.  0-2 drinks a day for men.  Be aware of how much alcohol is in your drink. In the [place] one drink equals one 12 oz bottle of beer (355 mL), one 5 oz glass of wine (148 mL), or one 1 oz glass of hard liquor (44 mL). Activity   Get enough exercise. Each week, do at least 150 minutes of exercise that takes a medium level of effort (moderate-intensity exercise). ? This is exercise that:  Makes your heart beat faster and makes you breathe harder than usual.  Allows you to still be  able to talk. ? You could exercise in short sessions several times a day or longer sessions a few times a week. For example, on 5 days each week, you could walk fast or ride your bike 3 times a day for 10 minutes each time.  Do exercises as told by your health care provider. Medicines  In addition to diet and lifestyle changes, your health care provider may recommend medicines to help lower cholesterol. This may be a medicine to lower the amount of cholesterol your liver makes. You may need medicine if: ? Diet and lifestyle changes do not lower your cholesterol enough. ? You have high cholesterol and other risk factors for heart disease or stroke.  Take over-the-counter and prescription medicines only as told by your health care provider. General information  Manage  your risk factors for high cholesterol. Talk with your health care provider about all your risk factors and how to lower your risk.  Manage other conditions that you have, such as diabetes or high blood pressure (hypertension).  Have blood tests to check your cholesterol levels at regular points in time as told by your health care provider.  Keep all follow-up visits as told by your health care provider. This is important. Where to find more information  American Heart Association: www.heart.org  National Heart, Lung, and Blood Institute: https://wilson-eaton.com/ Summary  High cholesterol increases your risk for heart disease and stroke. By keeping your cholesterol level low, you can reduce your risk for these conditions.  High cholesterol can often be prevented with diet and lifestyle changes.  Work with your health care provider to manage your risk factors, and have your blood tested regularly. This information is not intended to replace advice given to you by your health care provider. Make sure you discuss any questions you have with your health care provider. Document Revised: 06/26/2018 Document Reviewed:  11/11/2015 Elsevier Patient Education  2020 [employer].

## 2019-10-05 NOTE — Progress Notes (Signed)
BP 130/72 (BP Location: Left Arm)   Pulse (!) 59   Temp 98.4 F (36.9 C) (Oral)   Wt 224 lb 9.6 oz (101.9 kg)   SpO2 99%   BMI 29.63 kg/m    Subjective:    Patient ID: [name] [name], male    DOB: 02-May-1945, 74 y.o.   MRN: [id number]  HPI: [name] [name] is a 74 y.o. male  Chief Complaint  Patient presents with  . Hypertension  . Hypothyroidism   HYPERTENSION / HYPERLIPIDEMIA Currently taking Metoprolol 50 MG BID and Amiodarone 200 MG daily. Followed by Dr. Rockey [name], last saw 12/23/2018.  No medication changes.  Discussed statin therapy with patient and current ASCVD.  He has CPAP and utilizes this at home consistently. Satisfied with current treatment? yes Duration of hypertension: chronic BP monitoring frequency: a few times a week BP range: 104/66 to 146/82 -- majority below goal <130/80 BP medication side effects: no Duration of hyperlipidemia: chronic Cholesterol medication side effects: no Cholesterol supplements: none Medication compliance: good compliance Aspirin: no Recent stressors: no Recurrent headaches: no Visual changes: no Palpitations: no Dyspnea: no Chest pain: no Lower extremity edema: no Dizzy/lightheaded: no  The 10-year ASCVD risk score [name] [name]) is: 25.2%   Values used to calculate the score:     Age: 66 years     Sex: Male     Is Non-Hispanic African American: No     Diabetic: No     Tobacco smoker: No     Systolic Blood Pressure: 767 mmHg     Is BP treated: Yes     HDL Cholesterol: 49 mg/dL     Total Cholesterol: 153 mg/dL   ATRIAL FIBRILLATION Continues on Amiodarone 200 MG daily, Metoprolol 50 MG BID, and Apixaban 5 MG BID. Atrial fibrillation status: stable Satisfied with current treatment: yes  Medication side effects:  no Medication compliance: good compliance Etiology of atrial fibrillation:  Palpitations:  no Chest pain:  no Dyspnea on exertion:  no Orthopnea:  no Syncope:  no Edema:   no Ventricular rate control: B-blocker Anti-coagulation: long acting  HYPOTHYROIDISM Recent TSH 5.820.  Continues on Levothyroxine 75 MCG. Thyroid control status:stable Satisfied with current treatment? yes Medication side effects: no Medication compliance: good compliance Etiology of hypothyroidism:  Recent dose adjustment:yes Fatigue: no Cold intolerance: no Heat intolerance: no Weight gain: no Weight loss: no Constipation: no Diarrhea/loose stools: no Palpitations: no Lower extremity edema: no Anxiety/depressed mood: no  Relevant past medical, surgical, family and social history reviewed and updated as indicated. Interim medical history since our last visit reviewed. Allergies and medications reviewed and updated.  Review of Systems  Constitutional: Negative for activity change, diaphoresis, fatigue and fever.  Respiratory: Negative for cough, chest tightness, shortness of breath and wheezing.   Cardiovascular: Negative for chest pain, palpitations and leg swelling.  Gastrointestinal: Negative.   Endocrine: Negative.   Neurological: Negative.   Psychiatric/Behavioral: Negative.     Per HPI unless specifically indicated above     Objective:    BP 130/72 (BP Location: Left Arm)   Pulse (!) 59   Temp 98.4 F (36.9 C) (Oral)   Wt 224 lb 9.6 oz (101.9 kg)   SpO2 99%   BMI 29.63 kg/m   Wt Readings from Last 3 Encounters:  10/05/19 224 lb 9.6 oz (101.9 kg)  10/01/19 220 lb (99.8 kg)  12/23/18 216 lb (98 kg)    Physical Exam Vitals and nursing note reviewed.  Constitutional:      General: He is awake. He is not in acute distress.    Appearance: He is well-developed, well-groomed and overweight. He is not ill-appearing.  HENT:     Head: Normocephalic and atraumatic.     Right Ear: Hearing normal. No drainage.     Left Ear: Hearing normal. No drainage.  Eyes:     General: Lids are normal.        Right eye: No discharge.        Left eye: No discharge.      Conjunctiva/sclera: Conjunctivae normal.     Pupils: Pupils are equal, round, and reactive to light.  Neck:     Thyroid: No thyromegaly.     Vascular: No carotid bruit.     Trachea: Trachea normal.  Cardiovascular:     Rate and Rhythm: Normal rate and regular rhythm.     Heart sounds: Normal heart sounds, S1 normal and S2 normal. No murmur heard.  No gallop.   Pulmonary:     Effort: Pulmonary effort is normal. No accessory muscle usage or respiratory distress.     Breath sounds: Normal breath sounds.  Abdominal:     General: Bowel sounds are normal.     Palpations: Abdomen is soft. There is no hepatomegaly or splenomegaly.  Musculoskeletal:        General: Normal range of motion.     Cervical back: Normal range of motion and neck supple.     Right lower leg: No edema.     Left lower leg: No edema.  Skin:    General: Skin is warm and dry.     Capillary Refill: Capillary refill takes less than 2 seconds.  Neurological:     Mental Status: He is alert and oriented to person, place, and time.     Deep Tendon Reflexes: Reflexes are normal and symmetric.  Psychiatric:        Attention and Perception: Attention normal.        Mood and Affect: Mood normal.        Speech: Speech normal.        Behavior: Behavior normal. Behavior is cooperative.        Thought Content: Thought content normal.    Results for orders placed or performed in visit on 12/04/18  TSH  Result Value Ref Range   TSH 3.610 0.450 - 4.500 uIU/mL      Assessment & Plan:   Problem List Items Addressed This Visit      Cardiovascular and Mediastinum   Essential hypertension (Chronic)    Chronic, ongoing with initial elevation in BP, but repeat at and home readings almost consistently <130/80.  Continue current medication regimen and adjust as needed.  Recommend checking BP three mornings a week and documenting + focus on DASH diet.  Continue collaboration with cardiology.  BMP today.  Return in 6 months.       Relevant Orders   Basic metabolic panel   Lipid Panel w/o Chol/HDL Ratio   Atrial fibrillation with RVR (HCC) - Primary    Chronic, stable rate control.  Continue current medication regimen and collaboration with caridology.          Respiratory   OSA (obstructive sleep apnea)    Continue use of CPAP.        Endocrine   Hypothyroidism (Chronic)    Chronic, ongoing.  Continue current medication regimen and adjust as needed.  TSH and Free T4, monitor closely as is  also taking Amiodarone.      Relevant Orders   TSH   T4, free     Other   BMI 29.0-29.9,adult    BMI 29.63 today.  Recommended eating smaller high protein, low fat meals more frequently and exercising 30 mins a day 5 times a week with a goal of 10-15lb weight loss in the next 3 months. Patient voiced their understanding and motivation to adhere to these recommendations.       Hyperlipidemia LDL goal <70    No current statin, discussed at length current levels and ASCVD 25.2%.  He wishes to further discuss with cardiology at next visit.  If statin initiated would need to be low dose and assess closely as takes Amiodarone.  Lipid check today.          Follow up plan: Return in about 6 months (around 04/06/2020) for Annual physical.

## 2019-10-05 NOTE — Assessment & Plan Note (Signed)
Chronic, stable rate control.  Continue current medication regimen and collaboration with caridology.

## 2019-10-05 NOTE — Assessment & Plan Note (Signed)
Chronic, ongoing.  Continue current medication regimen and adjust as needed.  TSH and Free T4, monitor closely as is also taking Amiodarone.

## 2019-10-06 LAB — BASIC METABOLIC PANEL
BUN/Creatinine Ratio: 12 (ref 10–24)
BUN: 14 mg/dL (ref 8–27)
CO2: 27 mmol/L (ref 20–29)
Calcium: 9.2 mg/dL (ref 8.6–10.2)
Chloride: 105 mmol/L (ref 96–106)
Creatinine, Ser: 1.21 mg/dL (ref 0.76–1.27)
GFR calc Af Amer: 68 mL/min/{1.73_m2} (ref >59)
GFR calc non Af Amer: 59 mL/min/{1.73_m2} — ABNORMAL LOW (ref >59)
Glucose: 95 mg/dL (ref 65–99)
Potassium: 4.3 mmol/L (ref 3.5–5.2)
Sodium: 143 mmol/L (ref 134–144)

## 2019-10-06 LAB — LIPID PANEL W/O CHOL/HDL RATIO
Cholesterol, Total: 154 mg/dL (ref 100–199)
HDL: 48 mg/dL (ref >39)
LDL Chol Calc (NIH): 93 mg/dL (ref 0–99)
Triglycerides: 62 mg/dL (ref 0–149)
VLDL Cholesterol Cal: 13 mg/dL (ref 5–40)

## 2019-10-06 LAB — TSH: TSH: 3.93 u[IU]/mL (ref 0.450–4.500)

## 2019-10-06 LAB — T4, FREE: Free T4: 2.02 ng/dL — ABNORMAL HIGH (ref 0.82–1.77)

## 2019-10-06 NOTE — Progress Notes (Signed)
Contacted via [place] afternoon Mr. Nay.  Your labs have returned.  Kidney function and electrolytes remain stable.  Cholesterol levels are good, would like to see LDL less than 70 in future, we will monitor.  Thyroid is good, and you can continue current medication dose.  If any questions let me know.  Keep being awesome!! Kindest regards, Crystal Ellwood

## 2019-11-05 ENCOUNTER — Other Ambulatory Visit: Payer: Self-pay | Admitting: Cardiovascular Disease

## 2019-11-05 NOTE — Telephone Encounter (Signed)
Please review for refill, Thanks !  

## 2019-11-05 NOTE — Telephone Encounter (Signed)
Eliquis 5mg  refill request received. Patient is 74 years old, weight-101.9kg, Crea-1.21 on 10/05/2019, Diagnosis-Afib, and last seen by Dr. Rockey Situ on 12/23/2018 and has a pending appt on 02/01/2020. Dose is appropriate based on dosing criteria. Will send in refill to requested pharmacy.

## 2019-12-23 ENCOUNTER — Other Ambulatory Visit: Payer: Self-pay | Admitting: Cardiovascular Disease

## 2019-12-23 ENCOUNTER — Other Ambulatory Visit: Payer: Self-pay | Admitting: Nurse Practitioner

## 2019-12-28 ENCOUNTER — Other Ambulatory Visit: Payer: Self-pay

## 2019-12-28 ENCOUNTER — Encounter: Payer: Self-pay | Admitting: Nurse Practitioner

## 2019-12-28 ENCOUNTER — Ambulatory Visit (INDEPENDENT_AMBULATORY_CARE_PROVIDER_SITE_OTHER): Payer: PPO | Admitting: Nurse Practitioner

## 2019-12-28 VITALS — BP 136/77 | HR 66 | Temp 98.3°F | Wt 221.8 lb

## 2019-12-28 DIAGNOSIS — R220 Localized swelling, mass and lump, head: Secondary | ICD-10-CM | POA: Diagnosis not present

## 2019-12-28 DIAGNOSIS — Z23 Encounter for immunization: Secondary | ICD-10-CM

## 2019-12-28 NOTE — Progress Notes (Signed)
BP 136/77 (BP Location: Left Arm, Cuff Size: Normal)   Pulse 66   Temp 98.3 F (36.8 C) (Oral)   Wt 221 lb 12.8 oz (100.6 kg)   SpO2 98%   BMI 29.26 kg/m    Subjective:    Patient ID: [name] [name], male    DOB: 1945-07-29, 74 y.o.   MRN: [id number]  HPI: [name] [name] is a 74 y.o. male presenting with a face mass.   Chief Complaint  Patient presents with  . Mass    pt states he noticed a lump under his chin about 2 weeks ago. No drainage and has decreased in size a little bit per patient.   LUMP Duration: weeks Location: left side of face; below ear Onset: gradual Painful: no Discomfort: no Status:  smaller Trauma: no Redness: no Bruising: no Recent infection: no Swollen lymph nodes: no Requesting removal: no History of cancer: no Family history of cancer: no History of the same: no Associated signs and symptoms: none  No Known Allergies  Outpatient Encounter Medications as of 12/28/2019  Medication Sig  . apixaban (ELIQUIS) 5 MG TABS tablet Take 1 tablet (5 mg total) by mouth 2 (two) times daily.  . Cholecalciferol (VITAMIN D-3) 125 MCG (5000 UT) TABS Take 5,000 Units by mouth daily.  [name] [name] 75 MCG tablet Take 1 tablet by mouth once daily  . metoprolol tartrate (LOPRESSOR) 50 MG tablet Take 1 tablet by mouth twice daily  . Multiple Vitamin (MULTIVITAMIN) tablet Take 1 tablet by mouth daily.  Marland Kitchen PACERONE 200 MG tablet Take 1 tablet by mouth once daily  . Zinc 50 MG TABS Take 50 mg by mouth daily.   No facility-administered encounter medications on file as of 12/28/2019.   Patient Active Problem List   Diagnosis Date Noted  . Mass of mandible 12/28/2019  . BMI 29.0-29.9,adult 10/05/2019  . Hyperlipidemia LDL goal <70 10/05/2019  . Advanced care planning/counseling discussion 09/12/2016  . OSA (obstructive sleep apnea) 05/11/2015  . Atrial fibrillation with RVR ([place])   . Essential hypertension 09/05/2014  . Dupuytren's contracture 09/05/2014    . Hypothyroidism 09/05/2014   Past Medical History:  Diagnosis Date  . A-fib ([place])   . Atrial fibrillation with RVR ([place])   . Dupuytren's contracture   . Essential hypertension 09/05/2014  . Hypertension   . Hypokalemia   . Hypothyroidism   . Malignant neoplasm of prostate ([place]) 09/05/2014   prostate,skin of the scalp  . Mitral regurgitation    a. echo 03/2015: echo 03/2015: EF 50-55%, no RWMA, no sufficienct to allow for LV diastolic fxn, mild MR, LA mildly dilated at 46 mm, RV systolic fxn nl, PASP nl  . Nephrolithiasis   . New onset atrial fibrillation ([place]) 04/18/2015  . OSA (obstructive sleep apnea) 05/11/2015  . Persistent atrial fibrillation ([facility])    Relevant past medical, surgical, family and social history reviewed and updated as indicated. Interim medical history since our last visit reviewed.  Review of Systems  Constitutional: Negative.  Negative for diaphoresis, fatigue and fever.  HENT: Negative.  Negative for sore throat and trouble swallowing.   Respiratory: Negative.   Cardiovascular: Negative.   Gastrointestinal: Negative.   Skin: Negative.  Negative for color change, pallor, rash and wound.  Hematological: Negative.  Negative for adenopathy. Does not bruise/bleed easily.    Per HPI unless specifically indicated above     Objective:    BP 136/77 (BP Location: Left Arm, Cuff Size: Normal)  Pulse 66   Temp 98.3 F (36.8 C) (Oral)   Wt 221 lb 12.8 oz (100.6 kg)   SpO2 98%   BMI 29.26 kg/m   Wt Readings from Last 3 Encounters:  12/28/19 221 lb 12.8 oz (100.6 kg)  10/05/19 224 lb 9.6 oz (101.9 kg)  10/01/19 220 lb (99.8 kg)    Physical Exam Vitals and nursing note reviewed.  Constitutional:      General: He is not in acute distress.    Appearance: Normal appearance. He is not toxic-appearing.  HENT:     Head: Normocephalic and atraumatic.     Nose: Nose normal. No congestion or rhinorrhea.     Mouth/Throat:     Mouth: Mucous membranes are moist.      Pharynx: Oropharynx is clear. No oropharyngeal exudate or posterior oropharyngeal erythema.     Comments: Palpable 1 cm x 1 cm mass above left mandible near parotid area Eyes:     Extraocular Movements: Extraocular movements intact.  Musculoskeletal:     Cervical back: Normal range of motion and neck supple.  Lymphadenopathy:     Cervical: No cervical adenopathy.  Skin:    General: Skin is warm and dry.     Capillary Refill: Capillary refill takes less than 2 seconds.     Coloration: Skin is not jaundiced or pale.     Findings: No erythema.  Neurological:     Mental Status: He is alert and oriented to person, place, and time.     Motor: No weakness.     Gait: Gait normal.  Psychiatric:        Mood and Affect: Mood normal.        Behavior: Behavior normal.        Thought Content: Thought content normal.        Judgment: Judgment normal.       Assessment & Plan:   Problem List Items Addressed This Visit      Musculoskeletal and Integument   Mass of mandible - Primary    Acute, ongoing.  No signs or symptoms of infection today-no erythema, warmth, swelling, or pain upon palpation.  May be parotid stone-we will obtain ultrasound of area to rule out.  Will also obtain CBC to rule out infection/inflammatory process.  If symptoms persist or worsen, return to clinic.  Otherwise return to clinic as scheduled.      Relevant Orders   US Soft Tissue Head/Neck (NON-THYROID)   CBC with Differential/Platelet (Completed)    Other Visit Diagnoses    Need for influenza vaccination       Relevant Orders   Flu Vaccine QUAD High Dose(Fluad) (Completed)       Follow up plan: Return if symptoms worsen or fail to improve.

## 2019-12-28 NOTE — Patient Instructions (Addendum)
Influenza (Flu) Vaccine (Inactivated or Recombinant): What You Need to Know 1. Why get vaccinated? Influenza vaccine can prevent influenza (flu). Flu is a contagious disease that spreads around the [place] every year, usually between October and May. Anyone can get the flu, but it is more dangerous for some people. Infants and young children, people 75 years of age and older, pregnant women, and people with certain health conditions or a weakened immune system are at greatest risk of flu complications. Pneumonia, bronchitis, sinus infections and ear infections are examples of flu-related complications. If you have a medical condition, such as heart disease, cancer or diabetes, flu can make it worse. Flu can cause fever and chills, sore throat, muscle aches, fatigue, cough, headache, and runny or stuffy nose. Some people may have vomiting and diarrhea, though this is more common in children than adults. Each year thousands of people in the [place] die from flu, and many more are hospitalized. Flu vaccine prevents millions of illnesses and flu-related visits to the doctor each year. 2. Influenza vaccine CDC recommends everyone 53 months of age and older get vaccinated every flu season. Children 6 months through 91 years of age may need 2 doses during a single flu season. Everyone else needs only 1 dose each flu season. It takes about 2 weeks for protection to develop after vaccination. There are many flu viruses, and they are always changing. Each year a new flu vaccine is made to protect against three or four viruses that are likely to cause disease in the upcoming flu season. Even when the vaccine doesn't exactly match these viruses, it may still provide some protection. Influenza vaccine does not cause flu. Influenza vaccine may be given at the same time as other vaccines. 3. Talk with your health care provider Tell your vaccine provider if the person getting the vaccine:  Has had an  allergic reaction after a previous dose of influenza vaccine, or has any severe, life-threatening allergies.  Has ever had Guillain-Barr Syndrome (also called GBS). In some cases, your health care provider may decide to postpone influenza vaccination to a future visit. People with minor illnesses, such as a cold, may be vaccinated. People who are moderately or severely ill should usually wait until they recover before getting influenza vaccine. Your health care provider can give you more information. 4. Risks of a vaccine reaction  Soreness, redness, and swelling where shot is given, fever, muscle aches, and headache can happen after influenza vaccine.  There may be a very small increased risk of Guillain-Barr Syndrome (GBS) after inactivated influenza vaccine (the flu shot). Young children who get the flu shot along with pneumococcal vaccine (PCV13), and/or DTaP vaccine at the same time might be slightly more likely to have a seizure caused by fever. Tell your health care provider if a child who is getting flu vaccine has ever had a seizure. People sometimes faint after medical procedures, including vaccination. Tell your provider if you feel dizzy or have vision changes or ringing in the ears. As with any medicine, there is a very remote chance of a vaccine causing a severe allergic reaction, other serious injury, or death. 5. What if there is a serious problem? An allergic reaction could occur after the vaccinated person leaves the clinic. If you see signs of a severe allergic reaction (hives, swelling of the face and throat, difficulty breathing, a fast heartbeat, dizziness, or weakness), call 9-1-1 and get the person to the nearest hospital. For other signs that  concern you, call your health care provider. Adverse reactions should be reported to the Vaccine Adverse Event Reporting System (VAERS). Your health care provider will usually file this report, or you can do it yourself. Visit the  VAERS website at www.vaers.SamedayNews.es or call (618) 697-3615.VAERS is only for reporting reactions, and VAERS staff do not give medical advice. 6. The National Vaccine Injury Compensation Program The [employer] Vaccine Injury Compensation Program (VICP) is a federal program that was created to compensate people who may have been injured by certain vaccines. Visit the VICP website at GoldCloset.com.ee or call 954 715 9293 to learn about the program and about filing a claim. There is a time limit to file a claim for compensation. 7. How can I learn more?  Ask your healthcare provider.  Call your local or state health department.  Contact the Centers for Disease Control and Prevention (CDC): ? Call 507-524-6189 (1-800-CDC-INFO) or ? Visit CDC's https://gibson.com/ Vaccine Information Statement (Interim) Inactivated Influenza Vaccine (10/30/2017) This information is not intended to replace advice given to you by your health care provider. Make sure you discuss any questions you have with your health care provider. Document Revised: 06/23/2018 Document Reviewed: 11/03/2017 Elsevier Patient Education  [place].  Parotitis  Parotitis means that you have irritation and swelling (inflammation) in one or both of your parotid glands. These glands make saliva. They are found on each side of your face, below and in front of your earlobes. You may or may not have pain with this condition. What are the causes? This condition may be caused by:  Infections from germs (bacteria or viruses).  Something blocking the flow of saliva through the parotid glands. This can be a stone, scar tissue, or a tumor.  Diseases that cause your body's defense system (immune system) to attack healthy cells in your salivary glands. These are called autoimmune diseases. What increases the risk? You are more likely to get this condition if:  You are 34 years old or older.  You do not drink enough fluids  (are dehydrated).  You drink too much alcohol.  You have: ? A dry mouth. ? Diabetes. ? Gout. ? A long-term illness.  You do not take good care of your mouth and teeth (poor dental hygiene).  You have had radiation treatments to the head and neck.  You take certain medicines. What are the signs or symptoms? Symptoms of this condition depend on the cause. They may include:  Swelling under and in front of the ear. This may get worse after you eat.  Redness of the skin over the parotid gland.  Pain and tenderness over the parotid gland. This may get worse after you eat.  Fever or chills.  Pus coming from the ducts inside the mouth.  Dry mouth.  A bad taste in the mouth. How is this treated? Treatment for this condition depends on the cause. Treatment may include:  Antibiotic medicine for an infection from bacteria.  Drinking more fluids.  Removing a stone or obstruction.  Treating a disease that is causing parotitis.  Surgery to drain an infection, remove a growth, or remove the whole gland. Treatment may not be needed if the swelling goes away with home care. Follow these instructions at home: Medicines   Take over-the-counter and prescription medicines only as told by your doctor.  If you were prescribed an antibiotic medicine, take it as told by your doctor. Do not stop taking the antibiotic even if you start to feel better. Managing pain and  swelling  If told, put heat on the affected area. Do this as often as told by your doctor. Use the heat source that your doctor recommends, such as a moist heat pack or a heating pad. ? Place a towel between your skin and the heat source. ? Leave the heat on for 20-30 minutes. ? Remove the heat if your skin turns bright red. This is very important if you are unable to feel pain, heat, or cold. You may have a greater risk of getting burned.  Gargle with salt water 3-4 times a day or as needed. To make salt water, dissolve  -1 tsp (3-6 g) of salt in 1 cup (237 mL) of warm water.  Gently rub your parotid glands as told by your doctor. General instructions   Drink enough fluid to keep your pee (urine) pale yellow.  Keep your mouth clean and moist.  Suck on sour candy. This may help to: ? Make your mouth less dry. ? Make more saliva.  Take good care of your mouth: ? Brush your teeth at least two times a day. ? Floss your teeth every day. ? See your dentist regularly.  Do not use any products that contain nicotine or tobacco. These include cigarettes, e-cigarettes, and chewing tobacco. If you need help quitting, ask your doctor.  Do not drink alcohol.  Keep all follow-up visits as told by your doctor. This is important. Contact a doctor if:  You have a fever or chills.  You have new symptoms.  Your symptoms get worse.  Your symptoms do not get better with treatment. Get help right away if:  You have trouble breathing or swallowing. Summary  Parotitis means that you have irritation and swelling (inflammation) in one or both of your parotid glands.  Symptoms include pain and swelling under and in front of the ear.  Treatment for parotitis depends on the cause. In some cases, the condition may go away on its own with home care.  You should drink plenty of fluids, take good care of your mouth, and avoid tobacco products. This information is not intended to replace advice given to you by your health care provider. Make sure you discuss any questions you have with your health care provider. Document Revised: 09/30/2017 Document Reviewed: 09/30/2017 Elsevier Patient Education  [place].

## 2019-12-28 NOTE — Assessment & Plan Note (Addendum)
Acute, ongoing.  No signs or symptoms of infection today-no erythema, warmth, swelling, or pain upon palpation.  May be parotid stone-we will obtain ultrasound of area to rule out.  Will also obtain CBC to rule out infection/inflammatory process.  If symptoms persist or worsen, return to clinic.  Otherwise return to clinic as scheduled.

## 2019-12-29 LAB — CBC WITH DIFFERENTIAL/PLATELET
Basophils Absolute: 0.1 10*3/uL (ref 0.0–0.2)
Basos: 1 %
EOS (ABSOLUTE): 0.1 10*3/uL (ref 0.0–0.4)
Eos: 1 %
Hematocrit: 42.1 % (ref 37.5–51.0)
Hemoglobin: 13.8 g/dL (ref 13.0–17.7)
Immature Grans (Abs): 0 10*3/uL (ref 0.0–0.1)
Immature Granulocytes: 0 %
Lymphocytes Absolute: 1.7 10*3/uL (ref 0.7–3.1)
Lymphs: 35 %
MCH: 30.5 pg (ref 26.6–33.0)
MCHC: 32.8 g/dL (ref 31.5–35.7)
MCV: 93 fL (ref 79–97)
Monocytes Absolute: 0.6 10*3/uL (ref 0.1–0.9)
Monocytes: 12 %
Neutrophils Absolute: 2.4 10*3/uL (ref 1.4–7.0)
Neutrophils: 51 %
Platelets: 146 10*3/uL — ABNORMAL LOW (ref 150–450)
RBC: 4.52 x10E6/uL (ref 4.14–5.80)
RDW: 13.5 % (ref 11.6–15.4)
WBC: 4.8 10*3/uL (ref 3.4–10.8)

## 2020-01-03 ENCOUNTER — Ambulatory Visit
Admission: RE | Admit: 2020-01-03 | Discharge: 2020-01-03 | Disposition: A | Discharge status: Home / Self Care | Payer: PPO | Source: Ambulatory Visit | Attending: Nurse Practitioner | Admitting: Nurse Practitioner

## 2020-01-03 ENCOUNTER — Other Ambulatory Visit: Payer: Self-pay

## 2020-01-03 DIAGNOSIS — R220 Localized swelling, mass and lump, head: Secondary | ICD-10-CM | POA: Insufficient documentation

## 2020-01-03 DIAGNOSIS — E041 Nontoxic single thyroid nodule: Secondary | ICD-10-CM | POA: Diagnosis not present

## 2020-01-03 DIAGNOSIS — K116 Mucocele of salivary gland: Secondary | ICD-10-CM | POA: Diagnosis not present

## 2020-01-03 DIAGNOSIS — G930 Cerebral cysts: Secondary | ICD-10-CM | POA: Diagnosis not present

## 2020-01-12 ENCOUNTER — Other Ambulatory Visit: Payer: Self-pay | Admitting: Cardiovascular Disease

## 2020-01-31 DIAGNOSIS — D2271 Melanocytic nevi of right lower limb, including hip: Secondary | ICD-10-CM | POA: Diagnosis not present

## 2020-01-31 DIAGNOSIS — D225 Melanocytic nevi of trunk: Secondary | ICD-10-CM | POA: Diagnosis not present

## 2020-01-31 DIAGNOSIS — D2261 Melanocytic nevi of right upper limb, including shoulder: Secondary | ICD-10-CM | POA: Diagnosis not present

## 2020-01-31 DIAGNOSIS — B078 Other viral warts: Secondary | ICD-10-CM | POA: Diagnosis not present

## 2020-01-31 DIAGNOSIS — D2262 Melanocytic nevi of left upper limb, including shoulder: Secondary | ICD-10-CM | POA: Diagnosis not present

## 2020-01-31 DIAGNOSIS — X32XXXA Exposure to sunlight, initial encounter: Secondary | ICD-10-CM | POA: Diagnosis not present

## 2020-01-31 DIAGNOSIS — L570 Actinic keratosis: Secondary | ICD-10-CM | POA: Diagnosis not present

## 2020-01-31 DIAGNOSIS — Z85828 Personal history of other malignant neoplasm of skin: Secondary | ICD-10-CM | POA: Diagnosis not present

## 2020-01-31 NOTE — Progress Notes (Signed)
Date:  02/01/2020   ID:  [name] [name], DOB 1945/09/06, MRN [id number]  Patient Location:  [place] [address]   Provider location:   [facility], [place] office  PCP:  Venita Lick, NP  Cardiologist:  Patsy Baltimore  Chief Complaint  Patient presents with  . Other    12 month follow up. Meds reviewed verbally with patient.     History of Present Illness:    [name] [name] is a 74 y.o. male  past medical history of Afib with RVR with heart rates in the 180's in the setting of hypokalemia and colonoscopy prep,  previous cardioversion   started on Eliquis given his CHADS2VASc of 2 (HTN and age x 1).  Prior hematuria HTN,  remote smoking history when he was in his 62s and 32s, hypothyroidism,  prostate cancer,  CT scan ABD showing no CAD, no aorta disease or iliac vessel  Sleep apnea, wears his CPAP, avg about 3 hours then gets nasal congestion He presents for routine follow-up of his paroxysmal atrial fibrillation  Feels well, no shortness of breath, no chest pain on exertion Typically with no sx when in atrial fib  Continues to take amio 200 daily BP numbers reviewed from home: 120 to 140/80 Pulse 50s to 60s  Weight down on last visit,  Now trending back upwards, no racketball since covid Less exercise  doing garden, house chores  EKG personally reviewed by myself on todays visit NSR rate 66 bpm, PAC   Lab Results  Component Value Date   CHOL 154 10/05/2019   HDL 48 10/05/2019   LDLCALC 93 10/05/2019   TRIG 62 10/05/2019   Other past medical hx review  Previous echocardiogram ECHO EF 50 to 55%, 04/18/2015  clinic visit 05/30/16, atrial fibrillation for several days  does not feel as well when playing racquetball    previously on benazepril, stopped for low blood pressure  He did have episode of hematuria May 2017 Had a Cystoscopy. No significant pathology per the patient No more hematuria  since then    bowel prep for a routine screening colonoscopy which he was at [facility] as an outpatient for on 04/18/15. he was noted to be in new onset Afib with RVR with HR in the 180's  He was completely asymptomatic.    Past Medical History:  Diagnosis Date  . A-fib ([place])   . Atrial fibrillation with RVR ([place])   . Dupuytren's contracture   . Essential hypertension 09/05/2014  . Hypertension   . Hypokalemia   . Hypothyroidism   . Malignant neoplasm of prostate ([place]) 09/05/2014   prostate,skin of the scalp  . Mitral regurgitation    a. echo 03/2015: echo 03/2015: EF 50-55%, no RWMA, no sufficienct to allow for LV diastolic fxn, mild MR, LA mildly dilated at 46 mm, RV systolic fxn nl, PASP nl  . Nephrolithiasis   . New onset atrial fibrillation ([place]) 04/18/2015  . OSA (obstructive sleep apnea) 05/11/2015  . Persistent atrial fibrillation [facility])    Past Surgical History:  Procedure Laterality Date  . COLONOSCOPY WITH PROPOFOL N/A 04/18/2015   Procedure: COLONOSCOPY WITH PROPOFOL;  Surgeon: Lucilla Lame, MD;  Location: [facility] ENDOSCOPY;  Service: Endoscopy;  Laterality: N/A;  . ELECTROPHYSIOLOGIC STUDY N/A 05/05/2015   Procedure: CARDIOVERSION;  Surgeon: Wellington Hampshire, MD;  Location: [facility] ORS;  Service: Cardiovascular;  Laterality: N/A;  . RADIOACTIVE SEED IMPLANT N/A  Current Meds  Medication Sig  . amiodarone (PACERONE) 200 MG tablet Take 1 tablet by mouth once daily  . apixaban (ELIQUIS) 5 MG TABS tablet Take 1 tablet (5 mg total) by mouth 2 (two) times daily.  . Cholecalciferol (VITAMIN D-3) 125 MCG (5000 UT) TABS Take 5,000 Units by mouth daily.  Arna Medici 75 MCG tablet Take 1 tablet by mouth once daily  . metoprolol tartrate (LOPRESSOR) 50 MG tablet Take 1 tablet by mouth twice daily  . Multiple Vitamin (MULTIVITAMIN) tablet Take 1 tablet by mouth daily.  . Zinc 50 MG TABS Take 50 mg by mouth daily.     Allergies:   Patient has no known allergies.   Social History    Tobacco Use  . Smoking status: Former Smoker    Types: Cigarettes    Quit date: 09/05/1994    Years since quitting: 25.4  . Smokeless tobacco: Former Systems developer    Types: Chew    Quit date: 09/04/2004  Vaping Use  . Vaping Use: Never used  Substance Use Topics  . Alcohol use: No  . Drug use: No     Family Hx: The patient's family history includes Arthritis in his maternal grandmother; Heart attack in his mother; Heart disease in his father.  ROS:   Please see the history of present illness.    Review of Systems  Constitutional: Negative.   Respiratory: Negative.   Cardiovascular: Positive for palpitations.  Gastrointestinal: Negative.   Musculoskeletal: Negative.   Neurological: Negative.   Psychiatric/Behavioral: Negative.   All other systems reviewed and are negative.    Labs/Other Tests and Data Reviewed:    Recent Labs: 10/05/2019: BUN 14; Creatinine, Ser 1.21; Potassium 4.3; Sodium 143; TSH 3.930 12/28/2019: Hemoglobin 13.8; Platelets 146   Recent Lipid Panel Lab Results  Component Value Date/Time   CHOL 154 10/05/2019 09:55 AM   TRIG 62 10/05/2019 09:55 AM   HDL 48 10/05/2019 09:55 AM   CHOLHDL 2.7 09/24/2017 10:24 AM   LDLCALC 93 10/05/2019 09:55 AM    Wt Readings from Last 3 Encounters:  02/01/20 224 lb (101.6 kg)  12/28/19 221 lb 12.8 oz (100.6 kg)  10/05/19 224 lb 9.6 oz (101.9 kg)     Exam:    BP (!) 152/94 (BP Location: Left Arm, Patient Position: Sitting, Cuff Size: Normal)   Pulse 66   Ht 6\' 1"  (1.854 m)   Wt 224 lb (101.6 kg)   SpO2 98%   BMI 29.55 kg/m  Constitutional:  oriented to person, place, and time. No distress.  HENT:  Head: Grossly normal Eyes:  no discharge. No scleral icterus.  Neck: No JVD, no carotid bruits  Cardiovascular: Regular rate and rhythm, no murmurs appreciated Pulmonary/Chest: Clear to auscultation bilaterally, no wheezes or rails Abdominal: Soft.  no distension.  no tenderness.  Musculoskeletal: Normal range of  motion Neurological:  normal muscle tone. Coordination normal. No atrophy Skin: Skin warm and dry Psychiatric: normal affect, pleasant   ASSESSMENT & PLAN:    Persistent atrial fibrillation Recommend he continue amiodarone, metoprolol, Eliquis Amiodarone surveillance lab work completed July 2021 Last LFTs July 2020 normal range  Essential hypertension Blood pressure is well controlled on today's visit. No changes made to the medications.  OSA (obstructive sleep apnea) Wears CPAP on a regular basis Lots of nasal congestion, has to take off the mask after a few hours.     Total encounter time more than 25 minutes  Greater than 50% was spent in counseling  and coordination of care with the patient   Signed, Ida Rogue, MD  02/01/2020 8:56 AM    [place] [facility] [address], [place], [place] [address]

## 2020-02-01 ENCOUNTER — Ambulatory Visit: Payer: PPO | Admitting: Cardiovascular Disease

## 2020-02-01 ENCOUNTER — Other Ambulatory Visit: Payer: Self-pay

## 2020-02-01 ENCOUNTER — Encounter: Payer: Self-pay | Admitting: Cardiovascular Disease

## 2020-02-01 VITALS — BP 152/94 | HR 66 | Ht 73.0 in | Wt 224.0 lb

## 2020-02-01 DIAGNOSIS — G4733 Obstructive sleep apnea (adult) (pediatric): Secondary | ICD-10-CM | POA: Diagnosis not present

## 2020-02-01 DIAGNOSIS — I4819 Other persistent atrial fibrillation: Secondary | ICD-10-CM

## 2020-02-01 DIAGNOSIS — I10 Essential (primary) hypertension: Secondary | ICD-10-CM

## 2020-02-01 DIAGNOSIS — E039 Hypothyroidism, unspecified: Secondary | ICD-10-CM

## 2020-02-01 NOTE — Patient Instructions (Signed)
Medication Instructions:  No changes  If you need a refill on your cardiac medications before your next appointment, please call your pharmacy.    Lab work: No new labs needed   If you have labs (blood work) drawn today and your tests are completely normal, you will receive your results only by: . MyChart Message (if you have MyChart) OR . A paper copy in the mail If you have any lab test that is abnormal or we need to change your treatment, we will call you to review the results.   Testing/Procedures: No new testing needed   Follow-Up: At [facility], you and your health needs are our priority.  As part of our continuing mission to provide you with exceptional heart care, we have created designated Provider Care Teams.  These Care Teams include your primary Cardiologist (physician) and Advanced Practice Providers (APPs -  Physician Assistants and Nurse Practitioners) who all work together to provide you with the care you need, when you need it.  . You will need a follow up appointment in 12 months  . Providers on your designated Care Team:   . Christopher Berge, NP . Ryan Dunn, PA-C . Jacquelyn Visser, PA-C  Any Other Special Instructions Will Be Listed Below (If Applicable).  COVID-19 Vaccine Information can be found at: [email or web address] For questions related to vaccine distribution or appointments, please email [email or web address] or call [phone].     

## 2020-02-14 ENCOUNTER — Other Ambulatory Visit: Payer: Self-pay | Admitting: Cardiovascular Disease

## 2020-02-14 NOTE — Telephone Encounter (Signed)
Rx request sent to pharmacy.  

## 2020-02-24 ENCOUNTER — Other Ambulatory Visit: Payer: Self-pay | Admitting: Nurse Practitioner

## 2020-03-16 ENCOUNTER — Other Ambulatory Visit: Payer: Self-pay | Admitting: Cardiovascular Disease

## 2020-03-24 ENCOUNTER — Other Ambulatory Visit: Payer: Self-pay | Admitting: Cardiovascular Disease

## 2020-03-31 ENCOUNTER — Other Ambulatory Visit: Payer: Self-pay | Admitting: Cardiovascular Disease

## 2020-04-13 ENCOUNTER — Other Ambulatory Visit: Payer: Self-pay

## 2020-04-13 ENCOUNTER — Encounter: Payer: Self-pay | Admitting: Nurse Practitioner

## 2020-04-13 ENCOUNTER — Ambulatory Visit (INDEPENDENT_AMBULATORY_CARE_PROVIDER_SITE_OTHER): Payer: PPO | Admitting: Nurse Practitioner

## 2020-04-13 VITALS — BP 128/78 | HR 54 | Temp 98.3°F | Ht 72.32 in | Wt 226.0 lb

## 2020-04-13 DIAGNOSIS — E785 Hyperlipidemia, unspecified: Secondary | ICD-10-CM | POA: Diagnosis not present

## 2020-04-13 DIAGNOSIS — Z8546 Personal history of malignant neoplasm of prostate: Secondary | ICD-10-CM | POA: Diagnosis not present

## 2020-04-13 DIAGNOSIS — E6609 Other obesity due to excess calories: Secondary | ICD-10-CM | POA: Diagnosis not present

## 2020-04-13 DIAGNOSIS — Z23 Encounter for immunization: Secondary | ICD-10-CM

## 2020-04-13 DIAGNOSIS — Z0000 Encounter for general adult medical examination without abnormal findings: Secondary | ICD-10-CM

## 2020-04-13 DIAGNOSIS — Z6830 Body mass index (BMI) 30.0-30.9, adult: Secondary | ICD-10-CM

## 2020-04-13 DIAGNOSIS — G4733 Obstructive sleep apnea (adult) (pediatric): Secondary | ICD-10-CM | POA: Diagnosis not present

## 2020-04-13 DIAGNOSIS — I10 Essential (primary) hypertension: Secondary | ICD-10-CM | POA: Diagnosis not present

## 2020-04-13 DIAGNOSIS — E039 Hypothyroidism, unspecified: Secondary | ICD-10-CM

## 2020-04-13 DIAGNOSIS — I4891 Unspecified atrial fibrillation: Secondary | ICD-10-CM | POA: Diagnosis not present

## 2020-04-13 MED ORDER — LEVOTHYROXINE SODIUM 75 MCG PO TABS: 75.0000 ug | ORAL_TABLET | Freq: Every day | ORAL | 4 refills | Status: DC

## 2020-04-13 NOTE — Assessment & Plan Note (Signed)
No current statin, discussed at length current levels and ASCVD 26.6%.  He wishes to further discuss with cardiology at next visit.  If statin initiated would need to be low dose and assess closely as takes Amiodarone.  Lipid check today.

## 2020-04-13 NOTE — Assessment & Plan Note (Signed)
Chronic, ongoing.  Continue current medication regimen and adjust as needed.  TSH and Free T4, monitor closely as is also taking Amiodarone. 

## 2020-04-13 NOTE — Patient Instructions (Signed)

## 2020-04-13 NOTE — Assessment & Plan Note (Signed)
Chronic, ongoing with initial elevation in BP, but repeat at goal and home readings almost consistently <130/80.  Continue current medication regimen and adjust as needed.  Recommend checking BP three mornings a week and documenting + focus on DASH diet.  Continue collaboration with cardiology.  CMP today.  Return in 6 months.

## 2020-04-13 NOTE — Progress Notes (Signed)
BP 128/78 (BP Location: Left Arm)   Pulse (!) 54   Temp 98.3 F (36.8 C) (Oral)   Ht 6' 0.32" (1.837 m)   Wt 226 lb (102.5 kg)   SpO2 98%   BMI 30.38 kg/m    Subjective:    Patient ID: [name] [name], male    DOB: [birth year], 75 y.o.   MRN: [id number]  HPI: [name] [name] is a 75 y.o. male presenting on 04/13/2020 for comprehensive medical examination. Current medical complaints include:none  He currently lives with: wife Interim Problems from his last visit: no   HYPERTENSION / HYPERLIPIDEMIA Currently taking Metoprolol 50 MG BID and Amiodarone 200 MG daily. Followed by Dr. Mariah MillingGollan, last saw 02/01/20. No medication changes.  Discussed statin therapy with patient and current ASCVD. He has CPAP and utilizes this at home consistently. Satisfied with current treatment?yes Duration of hypertension:chronic BP monitoring frequency:a few times a week BP range:101/62 to 143/80 -- majority below goal <130/80 BP medication side effects:no Duration of hyperlipidemia:chronic Cholesterol medication side effects:no Cholesterol supplements: none Medication compliance:good compliance Aspirin:no Recent stressors:no Recurrent headaches:no Visual changes:no Palpitations:no Dyspnea:no Chest pain:no Lower extremity edema:no Dizzy/lightheaded:no The 10-year ASCVD risk score Denman George(Goff DC Jr., et al., 2013) is: 26.6%   Values used to calculate the score:     Age: [age] years     Sex: Male     Is Non-Hispanic African American: No     Diabetic: No     Tobacco smoker: No     Systolic Blood Pressure: 128 mmHg     Is BP treated: Yes     HDL Cholesterol: 48 mg/dL     Total Cholesterol: 154 mg/dL   ATRIAL FIBRILLATION Continues on Amiodarone 200 MG daily, Metoprolol 50 MG BID, and Apixaban 5 MG BID. Atrial fibrillation status:stable Satisfied with current treatment:yes Medication side effects:no Medication compliance:good compliance Etiology of atrial fibrillation:   Palpitations:no Chest pain:no Dyspnea on exertion:no Orthopnea:no Syncope:no Edema:no Ventricular rate control:B-blocker Anti-coagulation:long acting  HYPOTHYROIDISM Recent TSH 5.820. Continues on Levothyroxine 75 MCG. Thyroid control status:stable Satisfied with current treatment?yes Medication side effects:no Medication compliance:good compliance Etiology of hypothyroidism:  Recent dose adjustment:yes Fatigue:no Cold intolerance:no Heat intolerance:no Weight gain:no Weight loss:no Constipation:no Diarrhea/loose stools:no Palpitations:no Lower extremity edema:no Anxiety/depressed mood:no  Functional Status Survey: Is the patient deaf or have difficulty hearing?: No Does the patient have difficulty seeing, even when wearing glasses/contacts?: No Does the patient have difficulty concentrating, remembering, or making decisions?: No Does the patient have difficulty walking or climbing stairs?: No Does the patient have difficulty dressing or bathing?: No Does the patient have difficulty doing errands alone such as visiting a doctor's office or shopping?: No  FALL RISK: Fall Risk  04/13/2020 10/01/2019 02/09/2019 09/28/2018 09/24/2017  Falls in the past year? 0 0 0 0 No  Comment - - Emmi Telephone Survey: data to providers prior to load - -  Risk for fall due to : - Medication side effect - - -  Follow up - Falls evaluation completed;Education provided;Falls prevention discussed - - -    Depression Screen [facility] screen [facility] 2[facility] 04/13/2020 10/01/2019 09/28/2018 09/24/2017 09/03/2017  Decreased Interest 0 0 0 0 0  Down, Depressed, Hopeless 0 0 0 0 0  PHQ - 2 Score 0 0 0 0 0  Altered sleeping 0 0 0 - -  Tired, decreased energy 0 0 0 - -  Change in appetite 0 0 0 - -  Feeling bad or failure about yourself  0  0 0 - -  Trouble concentrating 0 0 0 - -  Moving slowly or fidgety/restless 0 0 0 - -  Suicidal thoughts 0 0 0 - -  PHQ-9 Score 0 0 0 - -   Difficult doing work/chores - Not difficult at all Not difficult at all - -    Advanced Directives <no information>  Past Medical History:  Past Medical History:  Diagnosis Date  . A-fib ([place])   . Atrial fibrillation with RVR ([place])   . Dupuytren's contracture   . Essential hypertension 09/05/2014  . Hypertension   . Hypokalemia   . Hypothyroidism   . 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 Mitral regurgitation    a. echo 03/2015: echo 03/2015: EF 50-55%, no RWMA, no sufficienct to allow for LV diastolic fxn, mild MR, LA mildly dilated at 46 mm, RV systolic fxn nl, PASP nl  . Nephrolithiasis   . New onset atrial fibrillation ([place]) 04/18/2015  . OSA (obstructive sleep apnea) 05/11/2015  . Persistent atrial fibrillation [facility])     Surgical History:  Past Surgical History:  Procedure Laterality Date  . COLONOSCOPY WITH PROPOFOL N/A 04/18/2015   Procedure: COLONOSCOPY WITH PROPOFOL;  Surgeon: Lucilla Lame, MD;  Location: [facility] ENDOSCOPY;  Service: Endoscopy;  Laterality: N/A;  . ELECTROPHYSIOLOGIC STUDY N/A 05/05/2015   Procedure: CARDIOVERSION;  Surgeon: Wellington Hampshire, MD;  Location: [facility] ORS;  Service: Cardiovascular;  Laterality: N/A;  . RADIOACTIVE SEED IMPLANT N/A     Medications:  Current Outpatient Medications on File Prior to Visit  Medication Sig  . amiodarone (PACERONE) 200 MG tablet Take 1 tablet by mouth once daily  . apixaban (ELIQUIS) 5 MG TABS tablet Take 1 tablet (5 mg total) by mouth 2 (two) times daily.  . Cholecalciferol (VITAMIN D-3) 125 MCG (5000 UT) TABS Take 5,000 Units by mouth daily.  . metoprolol tartrate (LOPRESSOR) 50 MG tablet Take 1 tablet by mouth twice daily  . Multiple Vitamin (MULTIVITAMIN) tablet Take 1 tablet by mouth daily.  . Zinc 50 MG TABS Take 50 mg by mouth daily.   No current facility-administered medications on file prior to visit.    Allergies:  No Known Allergies  Social History:  Social  History   Socioeconomic History  . Marital status: Married    Spouse name: Not on file  . Number of children: Not on file  . Years of education: Not on file  . Highest education level: Associate degree: academic program  Occupational History  . Occupation: retired   Tobacco Use  . Smoking status: Former Smoker    Types: Cigarettes    Quit date: 09/05/1994    Years since quitting: 25.6  . Smokeless tobacco: Former Systems developer    Types: Chew    Quit date: 09/04/2004  Vaping Use  . Vaping Use: Never used  Substance and Sexual Activity  . Alcohol use: No  . Drug use: No  . Sexual activity: Not Currently  Other Topics Concern  . Not on file  Social History Narrative   Plays racket ball twice a week, works outside    Scientist, physiological Strain: [place]   . Difficulty of Paying Living Expenses: Not hard at all  Food Insecurity: No Food Insecurity  . Worried About Charity fundraiser in the Last Year: Never true  . Ran Out of Food in the Last Year: Never true  Transportation Needs: No Transportation  Needs  . Lack of Transportation (Medical): No  . Lack of Transportation (Non-Medical): No  Physical Activity: Insufficiently Active  . Days of Exercise per Week: 2 days  . Minutes of Exercise per Session: 60 min  Stress: No Stress Concern Present  . Feeling of Stress : Not at all  Social Connections: Not on file  Intimate Partner Violence: Not on file   Social History   Tobacco Use  Smoking Status Former Smoker  . Types: Cigarettes  . Quit date: 09/05/1994  . Years since quitting: 25.6  Smokeless Tobacco Former Systems developer  . Types: Chew  . Quit date: 09/04/2004   Social History   Substance and Sexual Activity  Alcohol Use No    Family History:  Family History  Problem Relation Age of Onset  . Heart attack Mother   . Heart disease Father   . Arthritis Maternal Grandmother     Past medical history, surgical history, medications, allergies,  family history and social history reviewed with patient today and changes made to appropriate areas of the chart.   Review of Systems - negative All other ROS negative except what is listed above and in the HPI.      Objective:    BP 128/78 (BP Location: Left Arm)   Pulse (!) 54   Temp 98.3 F (36.8 C) (Oral)   Ht 6' 0.32" (1.837 m)   Wt 226 lb (102.5 kg)   SpO2 98%   BMI 30.38 kg/m   Wt Readings from Last 3 Encounters:  04/13/20 226 lb (102.5 kg)  02/01/20 224 lb (101.6 kg)  12/28/19 221 lb 12.8 oz (100.6 kg)    Physical Exam Vitals and nursing note reviewed.  Constitutional:      General: He is awake. He is not in acute distress.    Appearance: He is well-developed and well-groomed. He is obese. He is not ill-appearing.  HENT:     Head: Normocephalic and atraumatic.     Right Ear: Hearing, tympanic membrane, ear canal and external ear normal. No drainage.     Left Ear: Hearing, tympanic membrane, ear canal and external ear normal. No drainage.     Nose: Nose normal.     Mouth/Throat:     Pharynx: Uvula midline.  Eyes:     General: Lids are normal.        Right eye: No discharge.        Left eye: No discharge.     Extraocular Movements: Extraocular movements intact.     Conjunctiva/sclera: Conjunctivae normal.     Pupils: Pupils are equal, round, and reactive to light.     Visual Fields: Right eye visual fields normal and left eye visual fields normal.  Neck:     Thyroid: No thyromegaly.     Vascular: No carotid bruit or JVD.     Trachea: Trachea normal.  Cardiovascular:     Rate and Rhythm: Normal rate and regular rhythm.     Heart sounds: Normal heart sounds, S1 normal and S2 normal. No murmur heard. No gallop.   Pulmonary:     Effort: Pulmonary effort is normal. No accessory muscle usage or respiratory distress.     Breath sounds: Normal breath sounds.  Abdominal:     General: Bowel sounds are normal.     Palpations: Abdomen is soft. There is no hepatomegaly  or splenomegaly.     Tenderness: There is no abdominal tenderness.  Musculoskeletal:        General: Normal  range of motion.     Cervical back: Normal range of motion and neck supple.     Right lower leg: No edema.     Left lower leg: No edema.  Lymphadenopathy:     Head:     Right side of head: No submental, submandibular, tonsillar, preauricular or posterior auricular adenopathy.     Left side of head: No submental, submandibular, tonsillar, preauricular or posterior auricular adenopathy.     Cervical: No cervical adenopathy.  Skin:    General: Skin is warm and dry.     Capillary Refill: Capillary refill takes less than 2 seconds.     Findings: No rash.  Neurological:     Mental Status: He is alert and oriented to person, place, and time.     Cranial Nerves: Cranial nerves are intact.     Gait: Gait is intact.     Deep Tendon Reflexes: Reflexes are normal and symmetric.     Reflex Scores:      Brachioradialis reflexes are 2+ on the right side and 2+ on the left side.      Patellar reflexes are 2+ on the right side and 2+ on the left side. Psychiatric:        Attention and Perception: Attention normal.        Mood and Affect: Mood normal.        Speech: Speech normal.        Behavior: Behavior normal. Behavior is cooperative.        Thought Content: Thought content normal.        Cognition and Memory: Cognition normal.        Judgment: Judgment normal.      Results for orders placed or performed in visit on 12/28/19  CBC with Differential/Platelet  Result Value Ref Range   WBC 4.8 3.4 - 10.8 x10E3/uL   RBC 4.52 4.14 - 5.80 x10E6/uL   Hemoglobin 13.8 13.0 - 17.7 g/dL   Hematocrit 42.1 37.5 - 51.0 %   MCV 93 79 - 97 fL   MCH 30.5 26.6 - 33.0 pg   MCHC 32.8 31.5 - 35.7 g/dL   RDW 13.5 11.6 - 15.4 %   Platelets 146 (L) 150 - 450 x10E3/uL   Neutrophils 51 Not Estab. %   Lymphs 35 Not Estab. %   Monocytes 12 Not Estab. %   Eos 1 Not Estab. %   Basos 1 Not Estab. %    Neutrophils Absolute 2.4 1.4 - 7.0 x10E3/uL   Lymphocytes Absolute 1.7 0.7 - 3.1 x10E3/uL   Monocytes Absolute 0.6 0.1 - 0.9 x10E3/uL   EOS (ABSOLUTE) 0.1 0.0 - 0.4 x10E3/uL   Basophils Absolute 0.1 0.0 - 0.2 x10E3/uL   Immature Granulocytes 0 Not Estab. %   Immature Grans (Abs) 0.0 0.0 - 0.1 x10E3/uL      Assessment & Plan:   Problem List Items Addressed This Visit      Cardiovascular and Mediastinum   Essential hypertension (Chronic)    Chronic, ongoing with initial elevation in BP, but repeat at goal and home readings almost consistently <130/80.  Continue current medication regimen and adjust as needed.  Recommend checking BP three mornings a week and documenting + focus on DASH diet.  Continue collaboration with cardiology.  CMP today.  Return in 6 months.      Relevant Orders   CBC with Differential/Platelet   Comprehensive metabolic panel   Atrial fibrillation with RVR ([facility]) - Primary    Chronic,  stable rate control.  Continue current medication regimen and collaboration with caridology.  Labs today.        Respiratory   OSA (obstructive sleep apnea)    Continue use of CPAP 100% of the time.        Endocrine   Hypothyroidism (Chronic)    Chronic, ongoing.  Continue current medication regimen and adjust as needed.  TSH and Free T4, monitor closely as is also taking Amiodarone.      Relevant Medications   levothyroxine (EUTHYROX) 75 MCG tablet   Other Relevant Orders   TSH   T4, free     Other   Obesity    BMI 30.38 today.  Recommended eating smaller high protein, low fat meals more frequently and exercising 30 mins a day 5 times a week with a goal of 10-15lb weight loss in the next 3 months. Patient voiced their understanding and motivation to adhere to these recommendations.       Hyperlipidemia LDL goal <70    No current statin, discussed at length current levels and ASCVD 26.6%.  He wishes to further discuss with cardiology at next visit.  If statin  initiated would need to be low dose and assess closely as takes Amiodarone.  Lipid check today.      Relevant Orders   Lipid Panel w/o Chol/HDL Ratio   History of prostate cancer    No current symptoms, check PSA today      Relevant Orders   PSA    Other Visit Diagnoses    Encounter for annual physical exam       Annual labs today to include CBC, CMP, TSH, lipid, PSA.  Tetanus vaccine today.   Need for vaccination       Tetanus updated today   Relevant Orders   Td vaccine greater than or equal to 7yo preservative free IM (Completed)      Discussed aspirin prophylaxis for myocardial infarction prevention and decision was not indicated.  LABORATORY TESTING:  Health maintenance labs ordered today as discussed above.   The natural history of prostate cancer and ongoing controversy regarding screening and potential treatment outcomes of prostate cancer has been discussed with the patient. The meaning of a false positive PSA and a false negative PSA has been discussed. He indicates understanding of the limitations of this screening test and wishes to proceed with screening PSA testing.   IMMUNIZATIONS:   - Tdap: Tetanus vaccination status reviewed: Td vaccination indicated and given today. - Influenza: Up to date - Pneumovax: Up to date - Prevnar: Up to date - Zostavax vaccine: Up to date  SCREENING: - Colonoscopy: Up to date  Discussed with patient purpose of the colonoscopy is to detect colon cancer at curable precancerous or early stages   - AAA Screening: Not applicable  -Hearing Test: Not applicable  -Spirometry: Not applicable   PATIENT COUNSELING:    Sexuality: Discussed sexually transmitted diseases, partner selection, use of condoms, avoidance of unintended pregnancy  and contraceptive alternatives.   Advised to avoid cigarette smoking.  I discussed with the patient that most people either abstain from alcohol or drink within safe limits (<=14/week and <=4  drinks/occasion for males, <=7/weeks and <= 3 drinks/occasion for females) and that the risk for alcohol disorders and other health effects rises proportionally with the number of drinks per week and how often a drinker exceeds daily limits.  Discussed cessation/primary prevention of drug use and availability of treatment for abuse.   Diet: Encouraged  to adjust caloric intake to maintain  or achieve ideal body weight, to reduce intake of dietary saturated fat and total fat, to limit sodium intake by avoiding high sodium foods and not adding table salt, and to maintain adequate dietary potassium and calcium preferably from fresh fruits, vegetables, and low-fat dairy products.    Stressed the importance of regular exercise  Injury prevention: Discussed safety belts, safety helmets, smoke detector, smoking near bedding or upholstery.   Dental health: Discussed importance of regular tooth brushing, flossing, and dental visits.   Follow up plan: NEXT PREVENTATIVE PHYSICAL DUE IN 1 YEAR. Return in about 6 months (around 10/11/2020) for HTN/HLD, Thyroid, A-fib.

## 2020-04-13 NOTE — Assessment & Plan Note (Signed)
Continue use of CPAP 100% of the time.

## 2020-04-13 NOTE — Assessment & Plan Note (Signed)
BMI 30.38 today.  Recommended eating smaller high protein, low fat meals more frequently and exercising 30 mins a day 5 times a week with a goal of 10-15lb weight loss in the next 3 months. Patient voiced their understanding and motivation to adhere to these recommendations.

## 2020-04-13 NOTE — Assessment & Plan Note (Signed)
Chronic, stable rate control.  Continue current medication regimen and collaboration with caridology.  Labs today.

## 2020-04-13 NOTE — Assessment & Plan Note (Signed)
No current symptoms, check PSA today °

## 2020-04-14 LAB — LIPID PANEL W/O CHOL/HDL RATIO
Cholesterol, Total: 158 mg/dL (ref 100–199)
HDL: 48 mg/dL (ref >39)
LDL Chol Calc (NIH): 98 mg/dL (ref 0–99)
Triglycerides: 59 mg/dL (ref 0–149)
VLDL Cholesterol Cal: 12 mg/dL (ref 5–40)

## 2020-04-14 LAB — CBC WITH DIFFERENTIAL/PLATELET
Basophils Absolute: 0.1 10*3/uL (ref 0.0–0.2)
Basos: 1 %
EOS (ABSOLUTE): 0.1 10*3/uL (ref 0.0–0.4)
Eos: 1 %
Hematocrit: 47.1 % (ref 37.5–51.0)
Hemoglobin: 15.5 g/dL (ref 13.0–17.7)
Immature Grans (Abs): 0 10*3/uL (ref 0.0–0.1)
Immature Granulocytes: 0 %
Lymphocytes Absolute: 1.6 10*3/uL (ref 0.7–3.1)
Lymphs: 27 %
MCH: 30.6 pg (ref 26.6–33.0)
MCHC: 32.9 g/dL (ref 31.5–35.7)
MCV: 93 fL (ref 79–97)
Monocytes Absolute: 0.8 10*3/uL (ref 0.1–0.9)
Monocytes: 14 %
Neutrophils Absolute: 3.2 10*3/uL (ref 1.4–7.0)
Neutrophils: 57 %
Platelets: 132 10*3/uL — ABNORMAL LOW (ref 150–450)
RBC: 5.07 x10E6/uL (ref 4.14–5.80)
RDW: 13.2 % (ref 11.6–15.4)
WBC: 5.7 10*3/uL (ref 3.4–10.8)

## 2020-04-14 LAB — PSA: Prostate Specific Ag, Serum: <0.1 ng/mL (ref 0.0–4.0)

## 2020-04-14 LAB — COMPREHENSIVE METABOLIC PANEL
ALT: 21 IU/L (ref 0–44)
AST: 20 IU/L (ref 0–40)
Albumin/Globulin Ratio: 3.1 — ABNORMAL HIGH (ref 1.2–2.2)
Albumin: 4.6 g/dL (ref 3.7–4.7)
Alkaline Phosphatase: 105 IU/L (ref 44–121)
BUN/Creatinine Ratio: 10 (ref 10–24)
BUN: 13 mg/dL (ref 8–27)
Bilirubin Total: 1.3 mg/dL — ABNORMAL HIGH (ref 0.0–1.2)
CO2: 25 mmol/L (ref 20–29)
Calcium: 9.1 mg/dL (ref 8.6–10.2)
Chloride: 105 mmol/L (ref 96–106)
Creatinine, Ser: 1.28 mg/dL — ABNORMAL HIGH (ref 0.76–1.27)
GFR calc Af Amer: 63 mL/min/{1.73_m2} (ref >59)
GFR calc non Af Amer: 54 mL/min/{1.73_m2} — ABNORMAL LOW (ref >59)
Globulin, Total: 1.5 g/dL (ref 1.5–4.5)
Glucose: 93 mg/dL (ref 65–99)
Potassium: 4.1 mmol/L (ref 3.5–5.2)
Sodium: 144 mmol/L (ref 134–144)
Total Protein: 6.1 g/dL (ref 6.0–8.5)

## 2020-04-14 LAB — TSH: TSH: 4.2 u[IU]/mL (ref 0.450–4.500)

## 2020-04-14 LAB — T4, FREE: Free T4: 2.18 ng/dL — ABNORMAL HIGH (ref 0.82–1.77)

## 2020-04-14 NOTE — Progress Notes (Signed)
Contacted via MyChart   Good evening Mr. Wachter.  Your labs have returned: - Platelets remain on low side, but mild and stable at baseline.  We will continue to monitor these at visits. - Kidney function continues to show some mild kidney disease with no decline.  Continue to ensure plenty of water intake and we will monitor at visits. - Remainder of labs, cholesterol, thyroid, and prostate labs are stable.  Continue all current medications.  Any questions? Keep being awesome!!  Thank you for allowing me to participate in your care. Kindest regards, Emylie Amster

## 2020-05-05 ENCOUNTER — Other Ambulatory Visit: Payer: Self-pay | Admitting: Cardiovascular Disease

## 2020-05-08 NOTE — Telephone Encounter (Signed)
Eliquis 5mg  refill request received. Patient is 75 years old, weight-102.5kg, Crea-1.28 on 04/13/2020, Diagnosis-Afib, and last seen by Dr. Rockey Situ on 02/01/2020. Dose is appropriate based on dosing criteria. Will send in refill to requested pharmacy.

## 2020-05-08 NOTE — Telephone Encounter (Signed)
Please review for refill. Thanks!  

## 2020-05-22 ENCOUNTER — Other Ambulatory Visit: Payer: Self-pay | Admitting: Nurse Practitioner

## 2020-07-18 ENCOUNTER — Other Ambulatory Visit: Payer: Self-pay | Admitting: Cardiovascular Disease

## 2020-07-31 DIAGNOSIS — D044 Carcinoma in situ of skin of scalp and neck: Secondary | ICD-10-CM | POA: Diagnosis not present

## 2020-07-31 DIAGNOSIS — D2272 Melanocytic nevi of left lower limb, including hip: Secondary | ICD-10-CM | POA: Diagnosis not present

## 2020-07-31 DIAGNOSIS — D225 Melanocytic nevi of trunk: Secondary | ICD-10-CM | POA: Diagnosis not present

## 2020-07-31 DIAGNOSIS — D2261 Melanocytic nevi of right upper limb, including shoulder: Secondary | ICD-10-CM | POA: Diagnosis not present

## 2020-07-31 DIAGNOSIS — L821 Other seborrheic keratosis: Secondary | ICD-10-CM | POA: Diagnosis not present

## 2020-07-31 DIAGNOSIS — Z85828 Personal history of other malignant neoplasm of skin: Secondary | ICD-10-CM | POA: Diagnosis not present

## 2020-07-31 DIAGNOSIS — D485 Neoplasm of uncertain behavior of skin: Secondary | ICD-10-CM | POA: Diagnosis not present

## 2020-07-31 DIAGNOSIS — L570 Actinic keratosis: Secondary | ICD-10-CM | POA: Diagnosis not present

## 2020-07-31 DIAGNOSIS — X32XXXA Exposure to sunlight, initial encounter: Secondary | ICD-10-CM | POA: Diagnosis not present

## 2020-07-31 DIAGNOSIS — D2271 Melanocytic nevi of right lower limb, including hip: Secondary | ICD-10-CM | POA: Diagnosis not present

## 2020-07-31 DIAGNOSIS — D2262 Melanocytic nevi of left upper limb, including shoulder: Secondary | ICD-10-CM | POA: Diagnosis not present

## 2020-07-31 DIAGNOSIS — Z08 Encounter for follow-up examination after completed treatment for malignant neoplasm: Secondary | ICD-10-CM | POA: Diagnosis not present

## 2020-08-18 ENCOUNTER — Other Ambulatory Visit: Payer: Self-pay

## 2020-08-18 ENCOUNTER — Ambulatory Visit (INDEPENDENT_AMBULATORY_CARE_PROVIDER_SITE_OTHER): Payer: PPO | Admitting: Nurse Practitioner

## 2020-08-18 ENCOUNTER — Encounter: Payer: Self-pay | Admitting: Nurse Practitioner

## 2020-08-18 DIAGNOSIS — H6123 Impacted cerumen, bilateral: Secondary | ICD-10-CM | POA: Insufficient documentation

## 2020-08-18 NOTE — Patient Instructions (Signed)

## 2020-08-18 NOTE — Progress Notes (Signed)
BP (!) 147/83 (BP Location: Left Wrist, Cuff Size: Normal)   Pulse 63   Temp 98.6 F (37 C) (Oral)   Wt 222 lb 6.4 oz (100.9 kg)   SpO2 97%   BMI 29.89 kg/m    Subjective:    Patient ID: [name] [name], male    DOB: 1945/12/24, 75 y.o.   MRN: [id number]  HPI: [name] [name] is a 75 y.o. male  Chief Complaint  Patient presents with  . Ear Fullness    Patient states both of his ears are stopped and states he has tried OTC medications and nothing seemed to help. Patient states he nothing came out as he was trying to clean them and thinks he may have made them worse. Patient states it has gradually gotten worse over the past two weeks.    EARS CLOGGED Has had clogged ears for 2 weeks and continues to have issues.  Tried Neomed kit to clear ears.  For a week or so would clear up and then recently will not unclog.  R>L.  He was ill 1-2 weeks ago, they started clogging before then. Duration: weeks Involved ear(s): bilateral Fever: no Otorrhea: no Upper respiratory infection symptoms: yes Pruritus: no Hearing loss: yes Water immersion no Using Q-tips: no Recurrent otitis media: no Status: fluctuating Treatments attempted: Neomed  Relevant past medical, surgical, family and social history reviewed and updated as indicated. Interim medical history since our last visit reviewed. Allergies and medications reviewed and updated.  Review of Systems  Constitutional: Negative for activity change, diaphoresis, fatigue and fever.  HENT: Negative for ear discharge and ear pain.   Respiratory: Negative for cough, chest tightness, shortness of breath and wheezing.   Cardiovascular: Negative for chest pain, palpitations and leg swelling.  Neurological: Negative.     Per HPI unless specifically indicated above     Objective:    BP (!) 147/83 (BP Location: Left Wrist, Cuff Size: Normal)   Pulse 63   Temp 98.6 F (37 C) (Oral)   Wt 222 lb 6.4 oz (100.9 kg)   SpO2 97%   BMI  29.89 kg/m   Wt Readings from Last 3 Encounters:  08/18/20 222 lb 6.4 oz (100.9 kg)  04/13/20 226 lb (102.5 kg)  02/01/20 224 lb (101.6 kg)    Physical Exam Vitals and nursing note reviewed.  Constitutional:      General: He is awake. He is not in acute distress.    Appearance: He is well-developed, well-groomed and overweight. He is not ill-appearing.  HENT:     Head: Normocephalic and atraumatic.     Comments: Both ears irrigated with luke warm water and post irrigation canal clear, able to view TM without difficulty.  Patient reports no further pressure and able to hear well.    Right Ear: Hearing and external ear normal. There is impacted cerumen.     Left Ear: Hearing and external ear normal. There is impacted cerumen.  Eyes:     General: Lids are normal.        Right eye: No discharge.        Left eye: No discharge.     Conjunctiva/sclera: Conjunctivae normal.     Pupils: Pupils are equal, round, and reactive to light.  Neck:     Thyroid: No thyromegaly.     Vascular: No carotid bruit.     Trachea: Trachea normal.  Cardiovascular:     Rate and Rhythm: Normal rate and regular rhythm.  Heart sounds: Normal heart sounds, S1 normal and S2 normal. No murmur heard. No gallop.   Pulmonary:     Effort: Pulmonary effort is normal. No accessory muscle usage or respiratory distress.     Breath sounds: Normal breath sounds.  Abdominal:     General: Bowel sounds are normal.     Palpations: Abdomen is soft. There is no hepatomegaly or splenomegaly.  Musculoskeletal:        General: Normal range of motion.     Cervical back: Normal range of motion and neck supple.     Right lower leg: No edema.     Left lower leg: No edema.  Skin:    General: Skin is warm and dry.     Capillary Refill: Capillary refill takes less than 2 seconds.  Neurological:     Mental Status: He is alert and oriented to person, place, and time.     Deep Tendon Reflexes: Reflexes are normal and symmetric.   Psychiatric:        Attention and Perception: Attention normal.        Mood and Affect: Mood normal.        Speech: Speech normal.        Behavior: Behavior normal. Behavior is cooperative.        Thought Content: Thought content normal.     Results for orders placed or performed in visit on 04/13/20  CBC with Differential/Platelet  Result Value Ref Range   WBC 5.7 3.4 - 10.8 x10E3/uL   RBC 5.07 4.14 - 5.80 x10E6/uL   Hemoglobin 15.5 13.0 - 17.7 g/dL   Hematocrit 47.1 37.5 - 51.0 %   MCV 93 79 - 97 fL   MCH 30.6 26.6 - 33.0 pg   MCHC 32.9 31.5 - 35.7 g/dL   RDW 13.2 11.6 - 15.4 %   Platelets 132 (L) 150 - 450 x10E3/uL   Neutrophils 57 Not Estab. %   Lymphs 27 Not Estab. %   Monocytes 14 Not Estab. %   Eos 1 Not Estab. %   Basos 1 Not Estab. %   Neutrophils Absolute 3.2 1.4 - 7.0 x10E3/uL   Lymphocytes Absolute 1.6 0.7 - 3.1 x10E3/uL   Monocytes Absolute 0.8 0.1 - 0.9 x10E3/uL   EOS (ABSOLUTE) 0.1 0.0 - 0.4 x10E3/uL   Basophils Absolute 0.1 0.0 - 0.2 x10E3/uL   Immature Granulocytes 0 Not Estab. %   Immature Grans (Abs) 0.0 0.0 - 0.1 x10E3/uL  Comprehensive metabolic panel  Result Value Ref Range   Glucose 93 65 - 99 mg/dL   BUN 13 8 - 27 mg/dL   Creatinine, Ser 1.28 (H) 0.76 - 1.27 mg/dL   GFR calc non Af Amer 54 (L) >59 mL/min/1.73   GFR calc Af Amer 63 >59 mL/min/1.73   BUN/Creatinine Ratio 10 10 - 24   Sodium 144 134 - 144 mmol/L   Potassium 4.1 3.5 - 5.2 mmol/L   Chloride 105 96 - 106 mmol/L   CO2 25 20 - 29 mmol/L   Calcium 9.1 8.6 - 10.2 mg/dL   Total Protein 6.1 6.0 - 8.5 g/dL   Albumin 4.6 3.7 - 4.7 g/dL   Globulin, Total 1.5 1.5 - 4.5 g/dL   Albumin/Globulin Ratio 3.1 (H) 1.2 - 2.2   Bilirubin Total 1.3 (H) 0.0 - 1.2 mg/dL   Alkaline Phosphatase 105 44 - 121 IU/L   AST 20 0 - 40 IU/L   ALT 21 0 - 44 IU/L  Lipid Panel w/o  Chol/HDL Ratio  Result Value Ref Range   Cholesterol, Total 158 100 - 199 mg/dL   Triglycerides 59 0 - 149 mg/dL   HDL 48 >39  mg/dL   VLDL Cholesterol Cal 12 5 - 40 mg/dL   LDL Chol Calc (NIH) 98 0 - 99 mg/dL  TSH  Result Value Ref Range   TSH 4.200 0.450 - 4.500 uIU/mL  PSA  Result Value Ref Range   Prostate Specific Ag, Serum <0.1 0.0 - 4.0 ng/mL  T4, free  Result Value Ref Range   Free T4 2.18 (H) 0.82 - 1.77 ng/dL      Assessment & Plan:   Problem List Items Addressed This Visit      Nervous and Auditory   Bilateral hearing loss due to cerumen impaction    Acute for 2 weeks.  Bilateral ears irrigated for moderate cerumen, able to hear well post procedure and no further symptoms.  Recommend use of Debrox as home as needed.  Return to office if symptoms present again and return as scheduled.          Follow up plan: Return for as scheduled in August.

## 2020-08-18 NOTE — Assessment & Plan Note (Signed)
Acute for 2 weeks.  Bilateral ears irrigated for moderate cerumen, able to hear well post procedure and no further symptoms.  Recommend use of Debrox as home as needed.  Return to office if symptoms present again and return as scheduled.

## 2020-09-05 DIAGNOSIS — D044 Carcinoma in situ of skin of scalp and neck: Secondary | ICD-10-CM | POA: Diagnosis not present

## 2020-10-02 ENCOUNTER — Ambulatory Visit (INDEPENDENT_AMBULATORY_CARE_PROVIDER_SITE_OTHER): Payer: PPO

## 2020-10-02 VITALS — Ht 73.0 in | Wt 225.0 lb

## 2020-10-02 DIAGNOSIS — Z0000 Encounter for general adult medical examination without abnormal findings: Secondary | ICD-10-CM | POA: Diagnosis not present

## 2020-10-02 NOTE — Patient Instructions (Signed)
[name] , Thank you for taking time to come for your Medicare Wellness Visit. I appreciate your ongoing commitment to your health goals. Please review the following plan we discussed and let me know if I can assist you in the future.   Screening recommendations/referrals: Colonoscopy: completed 04/18/2015 Recommended yearly ophthalmology/optometry visit for glaucoma screening and checkup Recommended yearly dental visit for hygiene and checkup  Vaccinations: Influenza vaccine: completed 12/28/2019, due 10/16/2020 Pneumococcal vaccine: completed 02/28/2014 Tdap vaccine: completed 04/13/2020 Shingles vaccine: discussed   Covid-19:  12/14/2019, 05/28/2019, 05/07/2019  Advanced directives: Please bring a copy of your POA (Power of Attorney) and/or Living Will to your next appointment.   Conditions/risks identified: none  Next appointment: Follow up in one year for your annual wellness visit.   Preventive Care 46 Years and Older, Male Preventive care refers to lifestyle choices and visits with your health care provider that can promote health and wellness. What does preventive care include? A yearly physical exam. This is also called an annual well check. Dental exams once or twice a year. Routine eye exams. Ask your health care provider how often you should have your eyes checked. Personal lifestyle choices, including: Daily care of your teeth and gums. Regular physical activity. Eating a healthy diet. Avoiding tobacco and drug use. Limiting alcohol use. Practicing safe sex. Taking low doses of aspirin every day. Taking vitamin and mineral supplements as recommended by your health care provider. What happens during an annual well check? The services and screenings done by your health care provider during your annual well check will depend on your age, overall health, lifestyle risk factors, and family history of disease. Counseling  Your health care provider may ask you questions about  your: Alcohol use. Tobacco use. Drug use. Emotional well-being. Home and relationship well-being. Sexual activity. Eating habits. History of falls. Memory and ability to understand (cognition). Work and work Statistician. Screening  You may have the following tests or measurements: Height, weight, and BMI. Blood pressure. Lipid and cholesterol levels. These may be checked every 5 years, or more frequently if you are over 29 years old. Skin check. Lung cancer screening. You may have this screening every year starting at age 79 if you have a 30-pack-year history of smoking and currently smoke or have quit within the past 15 years. Fecal occult blood test (FOBT) of the stool. You may have this test every year starting at age 22. Flexible sigmoidoscopy or colonoscopy. You may have a sigmoidoscopy every 5 years or a colonoscopy every 10 years starting at age 73. Prostate cancer screening. Recommendations will vary depending on your family history and other risks. Hepatitis C blood test. Hepatitis B blood test. Sexually transmitted disease (STD) testing. Diabetes screening. This is done by checking your blood sugar (glucose) after you have not eaten for a while (fasting). You may have this done every 1-3 years. Abdominal aortic aneurysm (AAA) screening. You may need this if you are a current or former smoker. Osteoporosis. You may be screened starting at age 17 if you are at high risk. Talk with your health care provider about your test results, treatment options, and if necessary, the need for more tests. Vaccines  Your health care provider may recommend certain vaccines, such as: Influenza vaccine. This is recommended every year. Tetanus, diphtheria, and acellular pertussis (Tdap, Td) vaccine. You may need a Td booster every 10 years. Zoster vaccine. You may need this after age 70. Pneumococcal 13-valent conjugate (PCV13) vaccine. One dose is recommended  after age 21. Pneumococcal  polysaccharide (PPSV23) vaccine. One dose is recommended after age 42. Talk to your health care provider about which screenings and vaccines you need and how often you need them. This information is not intended to replace advice given to you by your health care provider. Make sure you discuss any questions you have with your health care provider. Document Released: 03/31/2015 Document Revised: 11/22/2015 Document Reviewed: 01/03/2015 Elsevier Interactive Patient Education  2017 [place] Prevention in the Home Falls can cause injuries. They can happen to people of all ages. There are many things you can do to make your home safe and to help prevent falls. What can I do on the outside of my home? Regularly fix the edges of walkways and driveways and fix any cracks. Remove anything that might make you trip as you walk through a door, such as a raised step or threshold. Trim any bushes or trees on the path to your home. Use bright outdoor lighting. Clear any walking paths of anything that might make someone trip, such as rocks or tools. Regularly check to see if handrails are loose or broken. Make sure that both sides of any steps have handrails. Any raised decks and porches should have guardrails on the edges. Have any leaves, snow, or ice cleared regularly. Use sand or salt on walking paths during winter. Clean up any spills in your garage right away. This includes oil or grease spills. What can I do in the bathroom? Use night lights. Install grab bars by the toilet and in the tub and shower. Do not use towel bars as grab bars. Use non-skid mats or decals in the tub or shower. If you need to sit down in the shower, use a plastic, non-slip stool. Keep the floor dry. Clean up any water that spills on the floor as soon as it happens. Remove soap buildup in the tub or shower regularly. Attach bath mats securely with double-sided non-slip rug tape. Do not have throw rugs and other  things on the floor that can make you trip. What can I do in the bedroom? Use night lights. Make sure that you have a light by your bed that is easy to reach. Do not use any sheets or blankets that are too big for your bed. They should not hang down onto the floor. Have a firm chair that has side arms. You can use this for support while you get dressed. Do not have throw rugs and other things on the floor that can make you trip. What can I do in the kitchen? Clean up any spills right away. Avoid walking on wet floors. Keep items that you use a lot in easy-to-reach places. If you need to reach something above you, use a strong step stool that has a grab bar. Keep electrical cords out of the way. Do not use floor polish or wax that makes floors slippery. If you must use wax, use non-skid floor wax. Do not have throw rugs and other things on the floor that can make you trip. What can I do with my stairs? Do not leave any items on the stairs. Make sure that there are handrails on both sides of the stairs and use them. Fix handrails that are broken or loose. Make sure that handrails are as long as the stairways. Check any carpeting to make sure that it is firmly attached to the stairs. Fix any carpet that is loose or worn. Avoid having throw  rugs at the top or bottom of the stairs. If you do have throw rugs, attach them to the floor with carpet tape. Make sure that you have a light switch at the top of the stairs and the bottom of the stairs. If you do not have them, ask someone to add them for you. What else can I do to help prevent falls? Wear shoes that: Do not have high heels. Have rubber bottoms. Are comfortable and fit you well. Are closed at the toe. Do not wear sandals. If you use a stepladder: Make sure that it is fully opened. Do not climb a closed stepladder. Make sure that both sides of the stepladder are locked into place. Ask someone to hold it for you, if possible. Clearly  mark and make sure that you can see: Any grab bars or handrails. First and last steps. Where the edge of each step is. Use tools that help you move around (mobility aids) if they are needed. These include: Canes. Walkers. Scooters. Crutches. Turn on the lights when you go into a dark area. Replace any light bulbs as soon as they burn out. Set up your furniture so you have a clear path. Avoid moving your furniture around. If any of your floors are uneven, fix them. If there are any pets around you, be aware of where they are. Review your medicines with your doctor. Some medicines can make you feel dizzy. This can increase your chance of falling. Ask your doctor what other things that you can do to help prevent falls. This information is not intended to replace advice given to you by your health care provider. Make sure you discuss any questions you have with your health care provider. Document Released: 12/29/2008 Document Revised: 08/10/2015 Document Reviewed: 04/08/2014 Elsevier Interactive Patient Education  2017 [employer].

## 2020-10-02 NOTE — Progress Notes (Signed)
I connected with [name] [name] today by telephone and verified that I am speaking with the correct person using two identifiers. Location patient: home Location provider: work Persons participating in the virtual visit: [name] [name], [name] Durand LPN.   I discussed the limitations, risks, security and privacy concerns of performing an evaluation and management service by telephone and the availability of in person appointments. I also discussed with the patient that there may be a patient responsible charge related to this service. The patient expressed understanding and verbally consented to this telephonic visit.    Interactive audio and video telecommunications were attempted between this provider and patient, however failed, due to patient having technical difficulties OR patient did not have access to video capability.  We continued and completed visit with audio only.     Vital signs may be patient reported or missing.  Subjective:   [name] [name] is a 75 y.o. male who presents for Medicare Annual/Subsequent preventive examination.  Review of Systems     Cardiac Risk Factors include: advanced age (>57men, >37 women);hypertension;male gender;dyslipidemia     Objective:    Today's Vitals   10/02/20 0952  Weight: 225 lb (102.1 kg)  Height: 6\' 1"  (1.854 m)   Body mass index is 29.69 kg/m.  Advanced Directives 10/02/2020 10/01/2019 09/28/2018 09/03/2017 08/28/2016 05/05/2015 04/18/2015  Does Patient Have a Medical Advance Directive? Yes Yes Yes Yes Yes No Yes  Type of Paramedic of [place];Living will [place];Living will Living will;Healthcare Power of Attorney Living will;Healthcare Power of [place];Living will - Living will  Does patient want to make changes to medical advance directive? - - - - - - No - Patient declined  Copy of [place] in Chart? No - copy requested No - copy  requested Yes - validated most recent copy scanned in chart (See row information) Yes Yes No - copy requested No - copy requested  Would patient like information on creating a medical advance directive? - - - - - No - patient declined information -    Current Medications (verified) Outpatient Encounter Medications as of 10/02/2020  Medication Sig   amiodarone (PACERONE) 200 MG tablet Take 1 tablet by mouth once daily   Cholecalciferol (VITAMIN D-3) 125 MCG (5000 UT) TABS Take 5,000 Units by mouth daily.   ELIQUIS 5 MG TABS tablet Take 1 tablet by mouth twice daily   levothyroxine (EUTHYROX) 75 MCG tablet Take 1 tablet (75 mcg total) by mouth daily.   metoprolol tartrate (LOPRESSOR) 50 MG tablet Take 1 tablet by mouth twice daily   Multiple Vitamin (MULTIVITAMIN) tablet Take 1 tablet by mouth daily.   Zinc 50 MG TABS Take 50 mg by mouth daily.   No facility-administered encounter medications on file as of 10/02/2020.    Allergies (verified) Patient has no known allergies.   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 echo 03/2015: echo 03/2015: EF 50-55%, no RWMA, no sufficienct to allow for LV diastolic fxn, mild MR, LA mildly dilated at 46 mm, RV systolic fxn nl, PASP nl   Nephrolithiasis    New onset atrial fibrillation (  [place]) 04/18/2015   OSA (obstructive sleep apnea) 05/11/2015   Persistent atrial fibrillation [facility])    Past Surgical History:  Procedure Laterality Date   COLONOSCOPY WITH PROPOFOL N/A 04/18/2015   Procedure: COLONOSCOPY WITH PROPOFOL;  Surgeon: Lucilla Lame, MD;  Location: [facility] ENDOSCOPY;  Service: Endoscopy;  Laterality: N/A;   ELECTROPHYSIOLOGIC STUDY N/A 05/05/2015   Procedure:  CARDIOVERSION;  Surgeon: Wellington Hampshire, MD;  Location: [facility] ORS;  Service: Cardiovascular;  Laterality: N/A;   RADIOACTIVE SEED IMPLANT N/A    Family History  Problem Relation Age of Onset   Heart attack Mother    Heart disease Father    Arthritis Maternal Grandmother    Social History   Socioeconomic History   Marital status: Married    Spouse name: Not on file   Number of children: Not on file   Years of education: Not on file   Highest education level: Associate degree: academic program  Occupational History   Occupation: retired   Tobacco Use   Smoking status: Former    Types: Cigarettes    Quit date: 09/05/1994    Years since quitting: 26.0   Smokeless tobacco: Former    Types: Chew    Quit date: 09/04/2004  Vaping Use   Vaping Use: Never used  Substance and Sexual Activity   Alcohol use: No   Drug use: No   Sexual activity: Not Currently  Other Topics Concern   Not on file  Social History Narrative   Plays racket ball twice a week, works outside    [place] Strain: Low Risk    Difficulty of Paying Living Expenses: Not hard at all  Food Insecurity: No Food Insecurity   Worried About Charity fundraiser in the Last Year: Never true   Arboriculturist in the Last Year: Never true  Transportation Needs: No Transportation Needs   Lack of Transportation (Medical): No   Lack of Transportation (Non-Medical): No  Physical Activity: Sufficiently Active   Days of Exercise per Week: 2 days   Minutes of Exercise per Session: 90 min  Stress: No Stress Concern Present   Feeling of Stress : Not at all  Social Connections: Not on file    Tobacco Counseling Counseling given: Not Answered   Clinical Intake:  Pre-visit preparation completed: Yes  Pain : No/denies pain     Nutritional Status: BMI 25 -29 Overweight Nutritional Risks: None Diabetes: No  How often do you need to have someone help you when you read  instructions, pamphlets, or other written materials from your doctor or pharmacy?: 1 - Never What is the last grade level you completed in school?: college  Diabetic? no  Interpreter Needed?: No  Information entered by :: NAllen LPN   Activities of Daily Living In your present state of health, do you have any difficulty performing the following activities: 10/02/2020 04/13/2020  Hearing? N N  Vision? N N  Difficulty concentrating or making decisions? N N  Walking or climbing stairs? N N  Dressing or bathing? N N  Doing errands, shopping? N N  Preparing Food and eating ? N -  Using the Toilet? N -  In the past six months, have you accidently leaked urine? N -  Do you have problems with loss of bowel control? N -  Managing your Medications? N -  Managing your Finances? N -  Housekeeping or managing your Housekeeping? N -  Some recent data might  be hidden    Patient Care Team: Venita Lick, NP as PCP - General (Nurse Practitioner) Lucilla Lame, MD as Consulting Physician (Gastroenterology) Minna Merritts, MD as Consulting Physician (Cardiology) Dasher, Rayvon Char, MD as Consulting Physician (Dermatology)  Indicate any recent Medical Services you may have received from other than [facility] providers in the past year (date may be approximate).     Assessment:   This is a routine wellness examination for [name] [name].  Hearing/Vision screen No results found.  Dietary issues and exercise activities discussed: Current Exercise Habits: Home exercise routine, Type of exercise: Other - see comments (racketball), Time (Minutes): > 60, Frequency (Times/Week): 2, Weekly Exercise (Minutes/Week): 0   Goals Addressed             This Visit's Progress    Patient Stated       10/02/2020, wants to lose 5 pounds       Depression Screen PHQ 2/9 Scores 10/02/2020 04/13/2020 10/01/2019 09/28/2018 09/24/2017 09/03/2017 09/12/2016  PHQ - 2 Score 0 0 0 0 0 0 0  PHQ- 9 Score - 0 0 0 - - -    Fall  Risk Fall Risk  10/02/2020 04/13/2020 10/01/2019 02/09/2019 09/28/2018  Falls in the past year? 0 0 0 0 0  Comment - - - Emmi Telephone Survey: data to providers prior to load -  Risk for fall due to : Medication side effect - Medication side effect - -  Follow up Falls evaluation completed;Education provided;Falls prevention discussed - Falls evaluation completed;Education provided;Falls prevention discussed - -    FALL RISK PREVENTION PERTAINING TO THE HOME:  Any stairs in or around the home? No  If so, are there any without handrails?  N/a Home free of loose throw rugs in walkways, pet beds, electrical cords, etc? Yes  Adequate lighting in your home to reduce risk of falls? Yes   ASSISTIVE DEVICES UTILIZED TO PREVENT FALLS:  Life alert? No  Use of a cane, walker or w/c? No  Grab bars in the bathroom? No  Shower chair or bench in shower? Yes  Elevated toilet seat or a handicapped toilet? Yes   TIMED UP AND GO:  Was the test performed? No .      Cognitive Function:     6CIT Screen 10/02/2020 10/01/2019 10/21/2018 09/28/2018 09/03/2017  What Year? 0 points 0 points 0 points 0 points 0 points  What month? 0 points 0 points 0 points 0 points 0 points  What time? 0 points 0 points 0 points 0 points 0 points  Count back from 20 0 points 0 points 0 points 0 points 0 points  Months in reverse 0 points 2 points 0 points 0 points 0 points  Repeat phrase 0 points 0 points 0 points 0 points 0 points  Total Score 0 2 0 0 0    Immunizations Immunization History  Administered Date(s) Administered   Fluad Quad(high Dose 65+) 12/04/2018, 12/28/2019   Influenza, High Dose Seasonal PF 03/07/2016, 03/26/2017, 03/31/2018   Influenza,inj,Quad PF,6+ Mos 03/07/2015   PFIZER(Purple Top)SARS-COV-2 Vaccination 05/07/2019, 05/28/2019, 12/14/2019   Pneumococcal Conjugate-13 02/28/2014   Pneumococcal Polysaccharide-23 08/02/2009   Td 04/13/2020   Tdap 07/28/2008   Zoster, Live 08/14/2010    TDAP  status: Up to date  Flu Vaccine status: Up to date  Pneumococcal vaccine status: Up to date  Covid-19 vaccine status: Completed vaccines  Qualifies for Shingles Vaccine? Yes   Zostavax completed Yes   Shingrix Completed?: No.  Education has been provided regarding the importance of this vaccine. Patient has been advised to call insurance company to determine out of pocket expense if they have not yet received this vaccine. Advised may also receive vaccine at local pharmacy or Health Dept. Verbalized acceptance and understanding.  Screening Tests Health Maintenance  Topic Date Due   Zoster Vaccines- Shingrix (1 of 2) Never done   COVID-19 Vaccine (4 - Booster for Pfizer series) 03/14/2020   INFLUENZA VACCINE  10/16/2020   COLONOSCOPY (Pts 45-65yrs Insurance coverage will need to be confirmed)  04/17/2025   TETANUS/TDAP  04/13/2030   Hepatitis C Screening  Completed   HPV VACCINES  Aged Out    Health Maintenance  Health Maintenance Due  Topic Date Due   Zoster Vaccines- Shingrix (1 of 2) Never done   COVID-19 Vaccine (4 - Booster for Pfizer series) 03/14/2020    Colorectal cancer screening: Type of screening: Colonoscopy. Completed 04/18/2015. Repeat every 10 years  Lung Cancer Screening: (Low Dose CT Chest recommended if Age 80-80 years, 30 pack-year currently smoking OR have quit w/in 15years.) does not qualify.   Lung Cancer Screening Referral: no  Additional Screening:  Hepatitis C Screening: does qualify; Completed 09/24/2017  Vision Screening: Recommended annual ophthalmology exams for early detection of glaucoma and other disorders of the eye. Is the patient up to date with their annual eye exam?  No  Who is the provider or what is the name of the office in which the patient attends annual eye exams? [facility] If pt is not established with a provider, would they like to be referred to a provider to establish care? No .   Dental Screening: Recommended  annual dental exams for proper oral hygiene  Community Resource Referral / Chronic Care Management: CRR required this visit?  No   CCM required this visit?  No      Plan:     I have personally reviewed and noted the following in the patient's chart:   Medical and social history Use of alcohol, tobacco or illicit drugs  Current medications and supplements including opioid prescriptions. Patient is not currently taking opioid prescriptions. Functional ability and status Nutritional status Physical activity Advanced directives List of other physicians Hospitalizations, surgeries, and ER visits in previous 12 months Vitals Screenings to include cognitive, depression, and falls Referrals and appointments  In addition, I have reviewed and discussed with patient certain preventive protocols, quality metrics, and best practice recommendations. A written personalized care plan for preventive services as well as general preventive health recommendations were provided to patient.     Kellie Simmering, LPN   [id number]   Nurse Notes:

## 2020-10-11 ENCOUNTER — Other Ambulatory Visit: Payer: Self-pay

## 2020-10-11 ENCOUNTER — Ambulatory Visit (INDEPENDENT_AMBULATORY_CARE_PROVIDER_SITE_OTHER): Payer: PPO | Admitting: Nurse Practitioner

## 2020-10-11 ENCOUNTER — Encounter: Payer: Self-pay | Admitting: Nurse Practitioner

## 2020-10-11 VITALS — BP 126/74 | HR 60 | Temp 98.6°F | Wt 224.2 lb

## 2020-10-11 DIAGNOSIS — Z8546 Personal history of malignant neoplasm of prostate: Secondary | ICD-10-CM

## 2020-10-11 DIAGNOSIS — R3121 Asymptomatic microscopic hematuria: Secondary | ICD-10-CM | POA: Diagnosis not present

## 2020-10-11 DIAGNOSIS — I10 Essential (primary) hypertension: Secondary | ICD-10-CM | POA: Diagnosis not present

## 2020-10-11 DIAGNOSIS — Z6830 Body mass index (BMI) 30.0-30.9, adult: Secondary | ICD-10-CM | POA: Diagnosis not present

## 2020-10-11 DIAGNOSIS — E785 Hyperlipidemia, unspecified: Secondary | ICD-10-CM

## 2020-10-11 DIAGNOSIS — I4891 Unspecified atrial fibrillation: Secondary | ICD-10-CM

## 2020-10-11 DIAGNOSIS — E039 Hypothyroidism, unspecified: Secondary | ICD-10-CM | POA: Diagnosis not present

## 2020-10-11 DIAGNOSIS — E6609 Other obesity due to excess calories: Secondary | ICD-10-CM | POA: Diagnosis not present

## 2020-10-11 DIAGNOSIS — G4733 Obstructive sleep apnea (adult) (pediatric): Secondary | ICD-10-CM | POA: Diagnosis not present

## 2020-10-11 LAB — URINALYSIS, ROUTINE W REFLEX MICROSCOPIC
Bilirubin, UA: NEGATIVE
Glucose, UA: NEGATIVE
Ketones, UA: NEGATIVE
Nitrite, UA: NEGATIVE
Protein,UA: NEGATIVE
Specific Gravity, UA: 1.015 (ref 1.005–1.030)
Urobilinogen, Ur: 0.2 mg/dL (ref 0.2–1.0)
pH, UA: 6.5 (ref 5.0–7.5)

## 2020-10-11 LAB — MICROSCOPIC EXAMINATION
Bacteria, UA: NONE SEEN
Epithelial Cells (non renal): NONE SEEN /hpf (ref 0–10)

## 2020-10-11 MED ORDER — FINASTERIDE 5 MG PO TABS: 5.0000 mg | ORAL_TABLET | Freq: Every day | ORAL | 12 refills | Status: DC

## 2020-10-11 NOTE — Assessment & Plan Note (Signed)
Chronic, stable rate control.  Continue current medication regimen and collaboration with caridology.  Labs today.

## 2020-10-11 NOTE — Progress Notes (Addendum)
BP 126/74 (BP Location: Left Arm)   Pulse 60   Temp 98.6 F (37 C) (Oral)   Wt 224 lb 3.2 oz (101.7 kg)   SpO2 97%   BMI 29.58 kg/m    Subjective:    Patient ID: [name] [name], male    DOB: [birth year], 75 y.o.   MRN: [id number]  HPI: [name] [name] is a 75 y.o. male  Chief Complaint  Patient presents with   Hyperlipidemia   Hypertension   Atrial Fibrillation   Hypothyroidism   Hematuria    Patient states Thursday he noticed blood in his urine and states he knew he had an upcoming appointment with provider and wanted to wait and discuss. Patient denies having any pain with urination.    HYPERTENSION / HYPERLIPIDEMIA Taking Metoprolol 50 MG BID and Amiodarone 200 MG daily. Followed by Dr. Rockey Situ, last saw 02/01/2020.  No medication changes.  Discussed statin therapy with patient and current ASCVD.  He has CPAP and utilizes this at home consistently. Satisfied with current treatment? yes Duration of hypertension: chronic BP monitoring frequency: a few times a week BP range: 113/67 and 138/79  and HR 60's -- majority below goal <130/80 BP medication side effects: no Duration of hyperlipidemia: chronic Cholesterol medication side effects: no Cholesterol supplements: none Medication compliance: good compliance Aspirin: no Recent stressors: no Recurrent headaches: no Visual changes: no Palpitations: no Dyspnea: no Chest pain: no Lower extremity edema: no Dizzy/lightheaded: no  The 10-year ASCVD risk score [name], et al., 2013) is: 25.4%   Values used to calculate the score:     Age: 1 years     Sex: Male     Is Non-Hispanic African American: No     Diabetic: No     Tobacco smoker: No     Systolic Blood Pressure: 024 mmHg     Is BP treated: Yes     HDL Cholesterol: 51 mg/dL     Total Cholesterol: 152 mg/dL    ATRIAL FIBRILLATION Continues on Amiodarone 200 MG daily, Metoprolol 50 MG BID, and Apixaban 5 MG BID. Atrial fibrillation status:  stable Satisfied with current treatment: yes  Medication side effects:  no Medication compliance: good compliance Etiology of atrial fibrillation:  Palpitations:  no Chest pain:  no Dyspnea on exertion:  no Orthopnea:  no Syncope:  no Edema:  no Ventricular rate control: B-blocker Anti-coagulation: long acting   HYPOTHYROIDISM Recent TSH 4.200 and Free T4 2.18.  Continues on Levothyroxine 75 MCG. Thyroid control status:stable Satisfied with current treatment? yes Medication side effects: no Medication compliance: good compliance Etiology of hypothyroidism:  Recent dose adjustment:yes Fatigue: no Cold intolerance: no Heat intolerance: no Weight gain: no Weight loss: no Constipation: no Diarrhea/loose stools: no Palpitations: no Lower extremity edema: no Anxiety/depressed mood: no  HEMATURIA Started last Thursday (21st) with episode x 2 of hematuria = initially dark red and then started to lighten, then Friday/Saturday/Sunday was clear.  Monday morning (25th) was present again, last most of day and was gone by evening = more bright red on that day.  Tuesday (26th) was good all day, until evening when he came home from racquetball, was medium red and then cleared.  No pain present with hematuria.  No recent injuries or falls.  Has started back to playing racquetball the 1st of July.   Is on Eliquis daily.  Has personal history of smoking, quit 40 years == got up to 1 PPD.  No alcohol use at  home. Has history of kidney stones, started in high school -- from 66 to age 63 had about 1/2 dozen.  Had surgery in past, in 30's.   Has history of seeing urology in July 2017 -- full work-up with CT and cystoscopy.  Has history of prostate cancer with brachytherapy and that that time was suspected to be related to a vessel breaking.  They offered Finasteride at the time to help reduce prostate vascularity, he refused -- they recommended to start this if hematuria presented again.   Dysuria:  no Urinary frequency:  after the episodes noted frequency at night Urgency: no Small volume voids: no Urinary incontinence: no Foul odor: no Hematuria: yes Abdominal pain: no Back pain: to lower back -- comes and goes, left lower side Suprapubic pain/pressure: no Flank pain: no Fever:  no Vomiting: no Status: stable Recurrent urinary tract infection: no Sexual activity: monogomous History of sexually transmitted disease: no Penile discharge: no Treatments attempted: none    Relevant past medical, surgical, family and social history reviewed and updated as indicated. Interim medical history since our last visit reviewed. Allergies and medications reviewed and updated.  Review of Systems  Constitutional:  Negative for activity change, diaphoresis, fatigue and fever.  Respiratory:  Negative for cough, chest tightness, shortness of breath and wheezing.   Cardiovascular:  Negative for chest pain, palpitations and leg swelling.  Gastrointestinal: Negative.   Endocrine: Negative.   Neurological: Negative.   Psychiatric/Behavioral: Negative.     Per HPI unless specifically indicated above     Objective:    BP 126/74 (BP Location: Left Arm)   Pulse 60   Temp 98.6 F (37 C) (Oral)   Wt 224 lb 3.2 oz (101.7 kg)   SpO2 97%   BMI 29.58 kg/m   Wt Readings from Last 3 Encounters:  10/11/20 224 lb 3.2 oz (101.7 kg)  10/02/20 225 lb (102.1 kg)  08/18/20 222 lb 6.4 oz (100.9 kg)    Physical Exam Vitals and nursing note reviewed.  Constitutional:      General: He is awake. He is not in acute distress.    Appearance: He is well-developed, well-groomed and overweight. He is not ill-appearing.  HENT:     Head: Normocephalic and atraumatic.     Right Ear: Hearing normal. No drainage.     Left Ear: Hearing normal. No drainage.  Eyes:     General: Lids are normal.        Right eye: No discharge.        Left eye: No discharge.     Conjunctiva/sclera: Conjunctivae normal.      Pupils: Pupils are equal, round, and reactive to light.  Neck:     Thyroid: No thyromegaly.     Vascular: No carotid bruit.     Trachea: Trachea normal.  Cardiovascular:     Rate and Rhythm: Normal rate and regular rhythm.     Heart sounds: Normal heart sounds, S1 normal and S2 normal. No murmur heard.   No gallop.  Pulmonary:     Effort: Pulmonary effort is normal. No accessory muscle usage or respiratory distress.     Breath sounds: Normal breath sounds.  Abdominal:     General: Bowel sounds are normal. There is no distension.     Palpations: Abdomen is soft. There is no hepatomegaly.     Tenderness: There is no abdominal tenderness. There is no right CVA tenderness or left CVA tenderness.  Musculoskeletal:  General: Normal range of motion.     Cervical back: Normal range of motion and neck supple.     Right lower leg: No edema.     Left lower leg: No edema.  Skin:    General: Skin is warm and dry.     Capillary Refill: Capillary refill takes less than 2 seconds.  Neurological:     Mental Status: He is alert and oriented to person, place, and time.     Deep Tendon Reflexes: Reflexes are normal and symmetric.  Psychiatric:        Attention and Perception: Attention normal.        Mood and Affect: Mood normal.        Speech: Speech normal.        Behavior: Behavior normal. Behavior is cooperative.        Thought Content: Thought content normal.   Results for orders placed or performed in visit on 10/11/20  Urine Culture   Specimen: Urine   UR  Result Value Ref Range   Urine Culture, Routine Final report    Organism ID, Bacteria No growth   Microscopic Examination   Urine  Result Value Ref Range   WBC, UA 0-5 0 - 5 /hpf   RBC 3-10 (A) 0 - 2 /hpf   Epithelial Cells (non renal) None seen 0 - 10 /hpf   Bacteria, UA None seen None seen/Few  Urinalysis, Routine w reflex microscopic  Result Value Ref Range   Specific Gravity, UA 1.015 1.005 - 1.030   pH, UA 6.5 5.0 -  7.5   Color, UA Yellow Yellow   Appearance Ur Clear Clear   Leukocytes,UA Trace (A) Negative   Protein,UA Negative Negative/Trace   Glucose, UA Negative Negative   Ketones, UA Negative Negative   RBC, UA 3+ (A) Negative   Bilirubin, UA Negative Negative   Urobilinogen, Ur 0.2 0.2 - 1.0 mg/dL   Nitrite, UA Negative Negative   Microscopic Examination See below:   CBC with Differential/Platelet  Result Value Ref Range   WBC 6.5 3.4 - 10.8 x10E3/uL   RBC 4.94 4.14 - 5.80 x10E6/uL   Hemoglobin 14.8 13.0 - 17.7 g/dL   Hematocrit 46.5 37.5 - 51.0 %   MCV 94 79 - 97 fL   MCH 30.0 26.6 - 33.0 pg   MCHC 31.8 31.5 - 35.7 g/dL   RDW 14.0 11.6 - 15.4 %   Platelets 106 (L) 150 - 450 x10E3/uL   Neutrophils 48 Not Estab. %   Lymphs 33 Not Estab. %   Monocytes 16 Not Estab. %   Eos 1 Not Estab. %   Basos 1 Not Estab. %   Neutrophils Absolute 3.1 1.4 - 7.0 x10E3/uL   Lymphocytes Absolute 2.1 0.7 - 3.1 x10E3/uL   Monocytes Absolute 1.0 (H) 0.1 - 0.9 x10E3/uL   EOS (ABSOLUTE) 0.1 0.0 - 0.4 x10E3/uL   Basophils Absolute 0.1 0.0 - 0.2 x10E3/uL   Immature Granulocytes 1 Not Estab. %   Immature Grans (Abs) 0.0 0.0 - 0.1 x10E3/uL   Hematology Comments: Note:   Comprehensive metabolic panel  Result Value Ref Range   Glucose 96 65 - 99 mg/dL   BUN 10 8 - 27 mg/dL   Creatinine, Ser 1.26 0.76 - 1.27 mg/dL   eGFR 59 (L) >59 mL/min/1.73   BUN/Creatinine Ratio 8 (L) 10 - 24   Sodium 145 (H) 134 - 144 mmol/L   Potassium 4.1 3.5 - 5.2 mmol/L   Chloride 104  96 - 106 mmol/L   CO2 26 20 - 29 mmol/L   Calcium 9.5 8.6 - 10.2 mg/dL   Total Protein 6.4 6.0 - 8.5 g/dL   Albumin 4.9 (H) 3.7 - 4.7 g/dL   Globulin, Total 1.5 1.5 - 4.5 g/dL   Albumin/Globulin Ratio 3.3 (H) 1.2 - 2.2   Bilirubin Total 1.7 (H) 0.0 - 1.2 mg/dL   Alkaline Phosphatase 117 44 - 121 IU/L   AST 21 0 - 40 IU/L   ALT 18 0 - 44 IU/L  Lipid Panel w/o Chol/HDL Ratio  Result Value Ref Range   Cholesterol, Total 152 100 - 199 mg/dL    Triglycerides 48 0 - 149 mg/dL   HDL 51 >39 mg/dL   VLDL Cholesterol Cal 10 5 - 40 mg/dL   LDL Chol Calc (NIH) 91 0 - 99 mg/dL      Assessment & Plan:   Problem List Items Addressed This Visit       Cardiovascular and Mediastinum   Essential hypertension (Chronic)    Chronic, ongoing with initial elevation in BP, but repeat at goal and home readings almost consistently <130/80.  Continue current medication regimen and adjust as needed.  Recommend checking BP three mornings a week and documenting + focus on DASH diet.  Continue collaboration with cardiology.  CMP today.  Return in 6 months.      Relevant Orders   Comprehensive metabolic panel (Completed)   Atrial fibrillation with RVR (HCC) - Primary    Chronic, stable rate control.  Continue current medication regimen and collaboration with caridology.  Labs today.        Respiratory   OSA (obstructive sleep apnea)    Continue use of CPAP 100% of the time.        Endocrine   Hypothyroidism (Chronic)    Chronic, ongoing.  Continue current medication regimen and adjust as needed.  TSH and Free T4 stable last check, monitor closely as is also taking Amiodarone.        Genitourinary   Asymptomatic microscopic hematuria    Acute for almost one week, intermittent with report of some lower left back pain.  UA 3+ blood, 3-10 RBC today.  History of kidney stones and history of similar in past with work-up by urology unremarkable -- they recommended Finasteride at the time.  At this time will obtain CT to assess for kidney stones.  Start Finasteride 5 MG daily, discussed at length with patient.  Referral to urology.  Recommend if bright red blood and significant amount to hold Eliquis and immediately alert provider.  Plan for return in 4 weeks.      Relevant Orders   Urinalysis, Routine w reflex microscopic (Completed)   CBC with Differential/Platelet (Completed)   CT HEMATURIA WORKUP (Completed)   Urine Culture (Completed)    Ambulatory referral to Urology     Other   Obesity    BMI 29.58 today.  Recommended eating smaller high protein, low fat meals more frequently and exercising 30 mins a day 5 times a week with a goal of 10-15lb weight loss in the next 3 months. Patient voiced their understanding and motivation to adhere to these recommendations.       Hyperlipidemia LDL goal <70    No current statin, discussed at length current levels and ASCVD 26.1%.  He wishes to further discuss with cardiology at next visit.  If statin initiated would need to be low dose and assess closely as takes Amiodarone.  Lipid check today.      Relevant Orders   Lipid Panel w/o Chol/HDL Ratio (Completed)   History of prostate cancer    No current symptoms, recent PSA stable.        Follow up plan: Return in about 4 weeks (around 11/08/2020) for Hematuria.

## 2020-10-11 NOTE — Patient Instructions (Signed)

## 2020-10-11 NOTE — Assessment & Plan Note (Signed)
Chronic, ongoing with initial elevation in BP, but repeat at goal and home readings almost consistently <130/80.  Continue current medication regimen and adjust as needed.  Recommend checking BP three mornings a week and documenting + focus on DASH diet.  Continue collaboration with cardiology.  CMP today.  Return in 6 months.

## 2020-10-11 NOTE — Assessment & Plan Note (Signed)
Continue use of CPAP 100% of the time.

## 2020-10-11 NOTE — Assessment & Plan Note (Signed)
No current symptoms, recent PSA stable.

## 2020-10-11 NOTE — Assessment & Plan Note (Signed)
BMI 29.58 today.  Recommended eating smaller high protein, low fat meals more frequently and exercising 30 mins a day 5 times a week with a goal of 10-15lb weight loss in the next 3 months. Patient voiced their understanding and motivation to adhere to these recommendations.

## 2020-10-11 NOTE — Assessment & Plan Note (Signed)
Acute for almost one week, intermittent with report of some lower left back pain.  UA 3+ blood, 3-10 RBC today.  History of kidney stones and history of similar in past with work-up by urology unremarkable -- they recommended Finasteride at the time.  At this time will obtain CT to assess for kidney stones.  Start Finasteride 5 MG daily, discussed at length with patient.  Referral to urology.  Recommend if bright red blood and significant amount to hold Eliquis and immediately alert provider.  Plan for return in 4 weeks.

## 2020-10-11 NOTE — Assessment & Plan Note (Signed)
No current statin, discussed at length current levels and ASCVD 26.1%.  He wishes to further discuss with cardiology at next visit.  If statin initiated would need to be low dose and assess closely as takes Amiodarone.  Lipid check today.

## 2020-10-11 NOTE — Assessment & Plan Note (Signed)
Chronic, ongoing.  Continue current medication regimen and adjust as needed.  TSH and Free T4 stable last check, monitor closely as is also taking Amiodarone.

## 2020-10-12 ENCOUNTER — Encounter: Payer: Self-pay | Admitting: Nurse Practitioner

## 2020-10-12 DIAGNOSIS — D696 Thrombocytopenia, unspecified: Secondary | ICD-10-CM | POA: Insufficient documentation

## 2020-10-12 LAB — CBC WITH DIFFERENTIAL/PLATELET
Basophils Absolute: 0.1 10*3/uL (ref 0.0–0.2)
Basos: 1 %
EOS (ABSOLUTE): 0.1 10*3/uL (ref 0.0–0.4)
Eos: 1 %
Hematocrit: 46.5 % (ref 37.5–51.0)
Hemoglobin: 14.8 g/dL (ref 13.0–17.7)
Immature Grans (Abs): 0 10*3/uL (ref 0.0–0.1)
Immature Granulocytes: 1 %
Lymphocytes Absolute: 2.1 10*3/uL (ref 0.7–3.1)
Lymphs: 33 %
MCH: 30 pg (ref 26.6–33.0)
MCHC: 31.8 g/dL (ref 31.5–35.7)
MCV: 94 fL (ref 79–97)
Monocytes Absolute: 1 10*3/uL — ABNORMAL HIGH (ref 0.1–0.9)
Monocytes: 16 %
Neutrophils Absolute: 3.1 10*3/uL (ref 1.4–7.0)
Neutrophils: 48 %
Platelets: 106 10*3/uL — ABNORMAL LOW (ref 150–450)
RBC: 4.94 x10E6/uL (ref 4.14–5.80)
RDW: 14 % (ref 11.6–15.4)
WBC: 6.5 10*3/uL (ref 3.4–10.8)

## 2020-10-12 LAB — COMPREHENSIVE METABOLIC PANEL
ALT: 18 IU/L (ref 0–44)
AST: 21 IU/L (ref 0–40)
Albumin/Globulin Ratio: 3.3 — ABNORMAL HIGH (ref 1.2–2.2)
Albumin: 4.9 g/dL — ABNORMAL HIGH (ref 3.7–4.7)
Alkaline Phosphatase: 117 IU/L (ref 44–121)
BUN/Creatinine Ratio: 8 — ABNORMAL LOW (ref 10–24)
BUN: 10 mg/dL (ref 8–27)
Bilirubin Total: 1.7 mg/dL — ABNORMAL HIGH (ref 0.0–1.2)
CO2: 26 mmol/L (ref 20–29)
Calcium: 9.5 mg/dL (ref 8.6–10.2)
Chloride: 104 mmol/L (ref 96–106)
Creatinine, Ser: 1.26 mg/dL (ref 0.76–1.27)
Globulin, Total: 1.5 g/dL (ref 1.5–4.5)
Glucose: 96 mg/dL (ref 65–99)
Potassium: 4.1 mmol/L (ref 3.5–5.2)
Sodium: 145 mmol/L — ABNORMAL HIGH (ref 134–144)
Total Protein: 6.4 g/dL (ref 6.0–8.5)
eGFR: 59 mL/min/{1.73_m2} — ABNORMAL LOW (ref >59)

## 2020-10-12 LAB — LIPID PANEL W/O CHOL/HDL RATIO
Cholesterol, Total: 152 mg/dL (ref 100–199)
HDL: 51 mg/dL (ref >39)
LDL Chol Calc (NIH): 91 mg/dL (ref 0–99)
Triglycerides: 48 mg/dL (ref 0–149)
VLDL Cholesterol Cal: 10 mg/dL (ref 5–40)

## 2020-10-12 NOTE — Progress Notes (Signed)
Contacted via [place] afternoon Massey, your labs have returned: - Platelet count remains a bit on low side, has been this way for a couple years.  Platelets are what help clot the blood.  We will continue to monitor these to ensure they do not get too low, right now they are only mildly low. - Kidney function, creatinine and eGFR, show some very mild reduction in eGFR which we will recheck next visit.  Sodium, salt, as a little high.  I recommend increase water intake at home and lower salt intake, will recheck next visit. - Cholesterol levels remain at baseline.  Any questions? Keep being awesome!!  Thank you for allowing me to participate in your care.  I appreciate you. Kindest regards, Domanic Matusek

## 2020-10-13 LAB — URINE CULTURE: Organism ID, Bacteria: NO GROWTH

## 2020-10-13 NOTE — Progress Notes (Signed)
Contacted via MyChart   Good morning [name] [name], urine culture shows no growth, no infection.

## 2020-10-19 ENCOUNTER — Ambulatory Visit: Payer: Self-pay | Admitting: Urology

## 2020-10-25 ENCOUNTER — Other Ambulatory Visit: Payer: Self-pay | Admitting: Cardiovascular Disease

## 2020-11-02 ENCOUNTER — Ambulatory Visit
Admission: RE | Admit: 2020-11-02 | Discharge: 2020-11-02 | Disposition: A | Discharge status: Home / Self Care | Payer: PPO | Source: Ambulatory Visit | Attending: Nurse Practitioner | Admitting: Nurse Practitioner

## 2020-11-02 DIAGNOSIS — R3121 Asymptomatic microscopic hematuria: Secondary | ICD-10-CM | POA: Diagnosis not present

## 2020-11-02 DIAGNOSIS — R310 Gross hematuria: Secondary | ICD-10-CM | POA: Diagnosis not present

## 2020-11-02 DIAGNOSIS — K7689 Other specified diseases of liver: Secondary | ICD-10-CM | POA: Diagnosis not present

## 2020-11-02 DIAGNOSIS — N200 Calculus of kidney: Secondary | ICD-10-CM | POA: Diagnosis not present

## 2020-11-02 DIAGNOSIS — K5730 Diverticulosis of large intestine without perforation or abscess without bleeding: Secondary | ICD-10-CM | POA: Diagnosis not present

## 2020-11-02 MED ORDER — IOHEXOL 350 MG/ML SOLN
100.0000 mL | Freq: Once | INTRAVENOUS | Status: AC | PRN
  Administered 2020-11-02: 100 mL via INTRAVENOUS

## 2020-11-03 ENCOUNTER — Encounter: Payer: Self-pay | Admitting: Nurse Practitioner

## 2020-11-03 DIAGNOSIS — I700 Atherosclerosis of aorta: Secondary | ICD-10-CM | POA: Insufficient documentation

## 2020-11-03 NOTE — Progress Notes (Signed)
Contacted via [place] morning [name] [name], your imaging has returned and overall is reassuring.  You do have a stone in the left side of kidneys, but this is not obstructing at this time.  If could possibly be reason for recent blood in urine if it shifted or irritated, but we will recheck urine next visit.  If still blood in urine I may send you to urology.  Other then that no acute findings, which is great news.  We will recheck urine at next visit and discuss more.  Any questions? Keep being awesome!!  Thank you for allowing me to participate in your care.  I appreciate you. Kindest regards, Irine Heminger

## 2020-11-04 ENCOUNTER — Encounter: Payer: Self-pay | Admitting: Nurse Practitioner

## 2020-11-04 DIAGNOSIS — D6869 Other thrombophilia: Secondary | ICD-10-CM | POA: Insufficient documentation

## 2020-11-04 DIAGNOSIS — N183 Chronic kidney disease, stage 3 unspecified: Secondary | ICD-10-CM | POA: Insufficient documentation

## 2020-11-08 ENCOUNTER — Other Ambulatory Visit: Payer: Self-pay

## 2020-11-08 ENCOUNTER — Ambulatory Visit (INDEPENDENT_AMBULATORY_CARE_PROVIDER_SITE_OTHER): Payer: PPO | Admitting: Nurse Practitioner

## 2020-11-08 ENCOUNTER — Encounter: Payer: Self-pay | Admitting: Nurse Practitioner

## 2020-11-08 VITALS — BP 136/68 | HR 69 | Temp 99.2°F | Ht 73.0 in | Wt 229.6 lb

## 2020-11-08 DIAGNOSIS — R3121 Asymptomatic microscopic hematuria: Secondary | ICD-10-CM | POA: Diagnosis not present

## 2020-11-08 DIAGNOSIS — N401 Enlarged prostate with lower urinary tract symptoms: Secondary | ICD-10-CM | POA: Diagnosis not present

## 2020-11-08 DIAGNOSIS — R351 Nocturia: Secondary | ICD-10-CM | POA: Diagnosis not present

## 2020-11-08 LAB — URINALYSIS, ROUTINE W REFLEX MICROSCOPIC
Bilirubin, UA: NEGATIVE
Glucose, UA: NEGATIVE
Leukocytes,UA: NEGATIVE
Nitrite, UA: NEGATIVE
Protein,UA: NEGATIVE
RBC, UA: NEGATIVE
Specific Gravity, UA: 1.025 (ref 1.005–1.030)
Urobilinogen, Ur: 0.2 mg/dL (ref 0.2–1.0)
pH, UA: 5.5 (ref 5.0–7.5)

## 2020-11-08 NOTE — Progress Notes (Signed)
BP 136/68   Pulse 69   Temp 99.2 F (37.3 C) (Oral)   Ht _0  (1.854 m)   Wt 229 lb 9.6 oz (104.1 kg)   SpO2 95%   BMI 30.29 kg/m    Subjective:    Patient ID: [name] [name], male    DOB: 1946-02-25, 75 y.o.   MRN: [id number]  HPI: [name] [name] is a 75 y.o. male  Chief Complaint  Patient presents with   Hematuria    Patient states he is doing better and has not noticed any more blood in his urine at least to be seen by the naked eye.    HEMATURIA Follow-up for hematuria.  Was first noticed on Thursday, August 21st with episode x 2 of hematuria = initially dark red and then started to lighten, then Friday/Saturday/Sunday was clear.  Does have history of kidney stones.  On recent CT imaging there was non obstructing  left renal calculus in inferior pole.  Sees urology tomorrow.  No pain present with hematuria, but he does endorse some occasional back pain at times.  No recent injuries or falls.    Is on Eliquis daily.  Has personal history of smoking, quit 40 years == got up to 1 PPD.  Has history of kidney stones, started in high school -- from 77 to age 42 had about 1/2 dozen.  Had surgery in past, in 30's.   Has history of seeing urology in July 2017 -- full work-up with CT and cystoscopy.  Has history of prostate cancer with brachytherapy and that that time was suspected to be related to a vessel breaking.  They offered Finasteride at the time to help reduce prostate vascularity, he refused at that time.  We started this last visit and he is tolerating it well.  He does endorse slightly weaker stream at baseline. Dysuria: no Urinary frequency: 2-3 times a night Urgency: no Small volume voids: no Urinary incontinence: no Foul odor: no Hematuria: none recently Abdominal pain: no Back pain: to lower back on occasion Suprapubic pain/pressure: no Flank pain: no Fever:  no Vomiting: no Status: stable Recurrent urinary tract infection: no Sexual activity:  monogomous History of sexually transmitted disease: no Penile discharge: no Treatments attempted: none   IPSS Questionnaire (AUA-7): 8 today Over the past month.   1)  How often have you had a sensation of not emptying your bladder completely after you finish urinating?  1 - Less than 1 time in 5  2)  How often have you had to urinate again less than two hours after you finished urinating? 0 - Not at all  3)  How often have you found you stopped and started again several times when you urinated?  0 - Not at all  4) How difficult have you found it to postpone urination?  0 - Not at all  5) How often have you had a weak urinary stream?  3 - About half the time  6) How often have you had to push or strain to begin urination?  1 - Less than 1 time in 5  7) How many times did you most typically get up to urinate from the time you went to bed until the time you got up in the morning?  3 - 3 times  Total score:  0-7 mildly symptomatic   8-19 moderately symptomatic   20-35 severely symptomatic   Relevant past medical, surgical, family and social history reviewed and updated as indicated.  Interim medical history since our last visit reviewed. Allergies and medications reviewed and updated.  Review of Systems  Constitutional:  Negative for activity change, diaphoresis, fatigue and fever.  Respiratory:  Negative for cough, chest tightness, shortness of breath and wheezing.   Cardiovascular:  Negative for chest pain, palpitations and leg swelling.  Gastrointestinal: Negative.   Endocrine: Negative.   Neurological: Negative.   Psychiatric/Behavioral: Negative.     Per HPI unless specifically indicated above     Objective:    BP 136/68   Pulse 69   Temp 99.2 F (37.3 C) (Oral)   Ht _0  (1.854 m)   Wt 229 lb 9.6 oz (104.1 kg)   SpO2 95%   BMI 30.29 kg/m   Wt Readings from Last 3 Encounters:  11/08/20 229 lb 9.6 oz (104.1 kg)  10/11/20 224 lb 3.2 oz (101.7 kg)  10/02/20 225 lb (102.1  kg)    Physical Exam Vitals and nursing note reviewed.  Constitutional:      General: He is awake. He is not in acute distress.    Appearance: He is well-developed, well-groomed and overweight. He is not ill-appearing.  HENT:     Head: Normocephalic and atraumatic.     Right Ear: Hearing normal. No drainage.     Left Ear: Hearing normal. No drainage.  Eyes:     General: Lids are normal.        Right eye: No discharge.        Left eye: No discharge.     Conjunctiva/sclera: Conjunctivae normal.     Pupils: Pupils are equal, round, and reactive to light.  Neck:     Thyroid: No thyromegaly.     Vascular: No carotid bruit.     Trachea: Trachea normal.  Cardiovascular:     Rate and Rhythm: Normal rate and regular rhythm.     Heart sounds: Normal heart sounds, S1 normal and S2 normal. No murmur heard.   No gallop.  Pulmonary:     Effort: Pulmonary effort is normal. No accessory muscle usage or respiratory distress.     Breath sounds: Normal breath sounds.  Abdominal:     General: Bowel sounds are normal. There is no distension.     Palpations: Abdomen is soft. There is no hepatomegaly.     Tenderness: There is no abdominal tenderness. There is no right CVA tenderness or left CVA tenderness.  Musculoskeletal:        General: Normal range of motion.     Cervical back: Normal range of motion and neck supple.     Right lower leg: No edema.     Left lower leg: No edema.  Skin:    General: Skin is warm and dry.     Capillary Refill: Capillary refill takes less than 2 seconds.  Neurological:     Mental Status: He is alert and oriented to person, place, and time.     Deep Tendon Reflexes: Reflexes are normal and symmetric.  Psychiatric:        Attention and Perception: Attention normal.        Mood and Affect: Mood normal.        Speech: Speech normal.        Behavior: Behavior normal. Behavior is cooperative.        Thought Content: Thought content normal.   Results for orders  placed or performed in visit on 10/11/20  Urine Culture   Specimen: Urine   UR  Result Value Ref  Range   Urine Culture, Routine Final report    Organism ID, Bacteria No growth   Microscopic Examination   Urine  Result Value Ref Range   WBC, UA 0-5 0 - 5 /hpf   RBC 3-10 (A) 0 - 2 /hpf   Epithelial Cells (non renal) None seen 0 - 10 /hpf   Bacteria, UA None seen None seen/Few  Urinalysis, Routine w reflex microscopic  Result Value Ref Range   Specific Gravity, UA 1.015 1.005 - 1.030   pH, UA 6.5 5.0 - 7.5   Color, UA Yellow Yellow   Appearance Ur Clear Clear   Leukocytes,UA Trace (A) Negative   Protein,UA Negative Negative/Trace   Glucose, UA Negative Negative   Ketones, UA Negative Negative   RBC, UA 3+ (A) Negative   Bilirubin, UA Negative Negative   Urobilinogen, Ur 0.2 0.2 - 1.0 mg/dL   Nitrite, UA Negative Negative   Microscopic Examination See below:   CBC with Differential/Platelet  Result Value Ref Range   WBC 6.5 3.4 - 10.8 x10E3/uL   RBC 4.94 4.14 - 5.80 x10E6/uL   Hemoglobin 14.8 13.0 - 17.7 g/dL   Hematocrit 46.5 37.5 - 51.0 %   MCV 94 79 - 97 fL   MCH 30.0 26.6 - 33.0 pg   MCHC 31.8 31.5 - 35.7 g/dL   RDW 14.0 11.6 - 15.4 %   Platelets 106 (L) 150 - 450 x10E3/uL   Neutrophils 48 Not Estab. %   Lymphs 33 Not Estab. %   Monocytes 16 Not Estab. %   Eos 1 Not Estab. %   Basos 1 Not Estab. %   Neutrophils Absolute 3.1 1.4 - 7.0 x10E3/uL   Lymphocytes Absolute 2.1 0.7 - 3.1 x10E3/uL   Monocytes Absolute 1.0 (H) 0.1 - 0.9 x10E3/uL   EOS (ABSOLUTE) 0.1 0.0 - 0.4 x10E3/uL   Basophils Absolute 0.1 0.0 - 0.2 x10E3/uL   Immature Granulocytes 1 Not Estab. %   Immature Grans (Abs) 0.0 0.0 - 0.1 x10E3/uL   Hematology Comments: Note:   Comprehensive metabolic panel  Result Value Ref Range   Glucose 96 65 - 99 mg/dL   BUN 10 8 - 27 mg/dL   Creatinine, Ser 1.26 0.76 - 1.27 mg/dL   eGFR 59 (L) >59 mL/min/1.73   BUN/Creatinine Ratio 8 (L) 10 - 24   Sodium 145 (H)  134 - 144 mmol/L   Potassium 4.1 3.5 - 5.2 mmol/L   Chloride 104 96 - 106 mmol/L   CO2 26 20 - 29 mmol/L   Calcium 9.5 8.6 - 10.2 mg/dL   Total Protein 6.4 6.0 - 8.5 g/dL   Albumin 4.9 (H) 3.7 - 4.7 g/dL   Globulin, Total 1.5 1.5 - 4.5 g/dL   Albumin/Globulin Ratio 3.3 (H) 1.2 - 2.2   Bilirubin Total 1.7 (H) 0.0 - 1.2 mg/dL   Alkaline Phosphatase 117 44 - 121 IU/L   AST 21 0 - 40 IU/L   ALT 18 0 - 44 IU/L  Lipid Panel w/o Chol/HDL Ratio  Result Value Ref Range   Cholesterol, Total 152 100 - 199 mg/dL   Triglycerides 48 0 - 149 mg/dL   HDL 51 >39 mg/dL   VLDL Cholesterol Cal 10 5 - 40 mg/dL   LDL Chol Calc (NIH) 91 0 - 99 mg/dL      Assessment & Plan:   Problem List Items Addressed This Visit       Genitourinary   Asymptomatic microscopic hematuria - Primary  No further bleeding at this time.  CT did note a non obstructing stone left side.  History of kidney stones and history of similar in past with work-up by urology unremarkable -- they recommended Finasteride at the time, which we started last visit.  At this time continue Finasteride 5 MG daily, discussed at length with patient.  Sees urology tomorrow, will review note after he sees them, appreciate their input.  Recheck urine today.      Relevant Orders   Urinalysis, Routine w reflex microscopic     Other   BPH associated with nocturia    AUA 8, history of prostate CA.  Started Finasteride last visit, will continue this.  Sees urology tomorrow, appreciate their input.        Follow up plan: Return in about 5 months (around 04/13/2021) for HTN/HLD, A-FIB, CKD, OSA.

## 2020-11-08 NOTE — Patient Instructions (Signed)

## 2020-11-08 NOTE — Assessment & Plan Note (Signed)
AUA 8, history of prostate CA.  Started Finasteride last visit, will continue this.  Sees urology tomorrow, appreciate their input.

## 2020-11-08 NOTE — Assessment & Plan Note (Signed)
No further bleeding at this time.  CT did note a non obstructing stone left side.  History of kidney stones and history of similar in past with work-up by urology unremarkable -- they recommended Finasteride at the time, which we started last visit.  At this time continue Finasteride 5 MG daily, discussed at length with patient.  Sees urology tomorrow, will review note after he sees them, appreciate their input.  Recheck urine today.

## 2020-11-08 NOTE — Progress Notes (Signed)
Contacted via [facility], no blood this time, but do have some ketones, meaning you need to drink a little more fluid during the day.

## 2020-11-09 ENCOUNTER — Encounter: Payer: Self-pay | Admitting: Urology

## 2020-11-09 ENCOUNTER — Ambulatory Visit: Payer: PPO | Admitting: Urology

## 2020-11-09 VITALS — BP 126/64 | HR 80 | Ht 73.0 in | Wt 229.0 lb

## 2020-11-09 DIAGNOSIS — R3129 Other microscopic hematuria: Secondary | ICD-10-CM

## 2020-11-09 DIAGNOSIS — C61 Malignant neoplasm of prostate: Secondary | ICD-10-CM | POA: Diagnosis not present

## 2020-11-09 DIAGNOSIS — R310 Gross hematuria: Secondary | ICD-10-CM

## 2020-11-09 NOTE — Progress Notes (Signed)
11/09/20 12:26 PM   [name] May 20, 1945 [id number]  CC: Gross hematuria, history of prostate cancer  HPI: I saw Mr. Bary Castilla for evaluation of gross hematuria.  He was last seen by Dr. Pilar Jarvis in August 2018 when he was having gross hematuria and small clots, and CT urogram was benign and cystoscopy showed some hypervascularity of the prostate.  He has not had any problems since that time.  He has a history of low risk prostate cancer in 2006 treated with brachytherapy, and PSA has remained undetectable.  He reports 2 days of gross hematuria a few weeks ago that was painless and resolved spontaneously.  A urinalysis with PCP showed 3-10 RBCs, and repeat a few weeks later was completely benign.  He denies any problems currently.  He was started on finasteride by PCP for the gross hematuria.  CT urogram was ordered by PCP on 11/02/2020 and was benign.   PMH: Past Medical History:  Diagnosis Date   A-fib [facility])    Atrial fibrillation with RVR ([place])    Dupuytren's contracture    Essential hypertension 09/05/2014   Hypertension    Hypokalemia    Hypothyroidism    Malignant neoplasm of prostate ([place]) 09/05/2014   prostate,skin of the scalp   Mitral regurgitation    a. echo 03/2015: echo 03/2015: EF 50-55%, no RWMA, no sufficienct to allow for LV diastolic fxn, mild MR, LA mildly dilated at 46 mm, RV systolic fxn nl, PASP nl   Nephrolithiasis    New onset atrial fibrillation ([place]) 04/18/2015   OSA (obstructive sleep apnea) 05/11/2015   Persistent atrial fibrillation ([place])     Surgical History: Past Surgical History:  Procedure Laterality Date   COLONOSCOPY WITH PROPOFOL N/A 04/18/2015   Procedure: COLONOSCOPY WITH PROPOFOL;  Surgeon: Lucilla Lame, MD;  Location: [facility] ENDOSCOPY;  Service: Endoscopy;  Laterality: N/A;   ELECTROPHYSIOLOGIC STUDY N/A 05/05/2015   Procedure: CARDIOVERSION;  Surgeon: Wellington Hampshire, MD;  Location: [facility] ORS;  Service: Cardiovascular;  Laterality: N/A;    RADIOACTIVE SEED IMPLANT N/A       Family History: Family History  Problem Relation Age of Onset   Heart attack Mother    Heart disease Father    Arthritis Maternal Grandmother     Social History:  reports that he quit smoking about 26 years ago. His smoking use included cigarettes. He quit smokeless tobacco use about 16 years ago.  His smokeless tobacco use included chew. He reports that he does not drink alcohol and does not use drugs.  Physical Exam: BP 126/64   Pulse 80   Ht '6\' 1"'$  (1.854 m)   Wt 229 lb (103.9 kg)   BMI 30.21 kg/m    Constitutional:  Alert and oriented, No acute distress. Cardiovascular: No clubbing, cyanosis, or edema. Respiratory: Normal respiratory effort, no increased work of breathing. GI: Abdomen is soft, nontender, nondistended, no abdominal masses   Laboratory Data: Reviewed  Pertinent Imaging: I have personally viewed and interpreted the CT urogram dated 11/02/2020 that shows no urologic abnormalities, 4 mm left lower pole stone with no obstruction.  Assessment & Plan:   75 year old male with history of gross hematuria and negative work-up in 2017, with recurrence of 2 days of gross hematuria that resolved spontaneously.  To follow-up urinalyses were essentially benign, and CT urogram 11/02/2020 was benign.  We discussed common possible etiologies of hematuria including BPH, malignancy, urolithiasis, medical renal disease, and idiopathic. Standard workup recommended by the AUA includes imaging with  CT urogram to assess the upper tracts, and cystoscopy. Cytology is performed on patient's with gross hematuria to look for malignant cells in the urine.  I recommended cystoscopy to complete his gross hematuria work-up.  He opts to defer with his negative work-up in 2017, and we discussed return precautions extensively.  I agree with continuing the finasteride, as likely etiology of his gross hematuria is hypervascular prostate per the cystoscopy notes in  2017, we discussed finasteride can take a few months to help shrink these blood vessels in the prostate and reduce episodes of gross hematuria   Nickolas Madrid, MD 11/09/2020  [place] [address] [place], [place] [address] [phone]

## 2020-11-10 LAB — URINALYSIS, COMPLETE
Bilirubin, UA: NEGATIVE
Glucose, UA: NEGATIVE
Ketones, UA: NEGATIVE
Leukocytes,UA: NEGATIVE
Nitrite, UA: NEGATIVE
Protein,UA: NEGATIVE
RBC, UA: NEGATIVE
Specific Gravity, UA: 1.015 (ref 1.005–1.030)
Urobilinogen, Ur: 0.2 mg/dL (ref 0.2–1.0)
pH, UA: 7.5 (ref 5.0–7.5)

## 2020-11-10 LAB — MICROSCOPIC EXAMINATION
Bacteria, UA: NONE SEEN
Epithelial Cells (non renal): NONE SEEN /hpf (ref 0–10)

## 2020-12-20 ENCOUNTER — Other Ambulatory Visit: Payer: Self-pay | Admitting: Cardiovascular Disease

## 2021-01-26 ENCOUNTER — Other Ambulatory Visit: Payer: Self-pay | Admitting: Cardiovascular Disease

## 2021-01-31 ENCOUNTER — Encounter: Payer: Self-pay | Admitting: Cardiovascular Disease

## 2021-01-31 ENCOUNTER — Other Ambulatory Visit: Payer: Self-pay

## 2021-01-31 ENCOUNTER — Ambulatory Visit: Payer: PPO | Admitting: Cardiovascular Disease

## 2021-01-31 VITALS — BP 150/102 | HR 65 | Ht 73.0 in | Wt 226.0 lb

## 2021-01-31 DIAGNOSIS — G4733 Obstructive sleep apnea (adult) (pediatric): Secondary | ICD-10-CM | POA: Diagnosis not present

## 2021-01-31 DIAGNOSIS — E039 Hypothyroidism, unspecified: Secondary | ICD-10-CM

## 2021-01-31 DIAGNOSIS — I10 Essential (primary) hypertension: Secondary | ICD-10-CM | POA: Diagnosis not present

## 2021-01-31 DIAGNOSIS — I4819 Other persistent atrial fibrillation: Secondary | ICD-10-CM | POA: Diagnosis not present

## 2021-01-31 NOTE — Patient Instructions (Signed)
Medication Instructions:  No changes  If you need a refill on your cardiac medications before your next appointment, please call your pharmacy.    Lab work: No new labs needed   If you have labs (blood work) drawn today and your tests are completely normal, you will receive your results only by: MyChart Message (if you have MyChart) OR A paper copy in the mail If you have any lab test that is abnormal or we need to change your treatment, we will call you to review the results.   Testing/Procedures: No new testing needed   Follow-Up: At [facility], you and your health needs are our priority.  As part of our continuing mission to provide you with exceptional heart care, we have created designated Provider Care Teams.  These Care Teams include your primary Cardiologist (physician) and Advanced Practice Providers (APPs -  Physician Assistants and Nurse Practitioners) who all work together to provide you with the care you need, when you need it.  You will need a follow up appointment in 12 months  Providers on your designated Care Team:   Christopher Berge, NP Ryan Dunn, PA-C Jacquelyn Visser, PA-C Cadence Furth, PA-C  Any Other Special Instructions Will Be Listed Below (If Applicable).  COVID-19 Vaccine Information can be found at: [email or web address] For questions related to vaccine distribution or appointments, please email [email or web address] or call [phone].    

## 2021-01-31 NOTE — Progress Notes (Signed)
Date:  01/31/2021   ID:  [name] [name], DOB 12/23/1945, MRN [id number]  Patient Location:  [place] [id number]   Provider location:   [facility], [place] office  PCP:  Venita Lick, NP  Cardiologist:  Patsy Baltimore  Chief Complaint  Patient presents with   12 month follow up     "Doing well." Medications reviewed by the patient verbally.     History of Present Illness:    [name] [name] is a 75 y.o. male  past medical history of Afib with RVR with heart rates in the 180's in the setting of hypokalemia and colonoscopy prep,  previous cardioversion   started on Eliquis given his CHADS2VASc of 2 (HTN and age x 1).  Prior hematuria HTN,  remote smoking history when he was in his 58s and 82s, hypothyroidism,  prostate cancer, CT scan ABD showing no CAD, no aorta disease or iliac vessel  Sleep apnea, wears his CPAP, avg about 3 hours then gets nasal congestion He presents for routine follow-up of his paroxysmal atrial fibrillation  In follow-up today reports he feels well Weight stable, denies significant symptoms of tachypalpitations concerning for atrial fibrillation Plays racquetball  Pressure at home: 144 to 384 Avg 536 systolic  No SOB, no chest pain  Hx of hematuria,heavy in 2021, seen by urology Had CT scan, images reviewed, minimal aorta plaque noted  Continues to take amio 200 daily doing garden, house chores  EKG personally reviewed by myself on todays visit NSR rate 65 bpm, PAC no significant ST or T wave changes   Lab Results  Component Value Date   CHOL 152 10/11/2020   HDL 51 10/11/2020   Zoar 91 10/11/2020   TRIG 48 10/11/2020   Other past medical hx review  Previous echocardiogram ECHO EF 50 to 55%, 04/18/2015   clinic visit 05/30/16, atrial fibrillation for several days  does not feel as well when playing racquetball     previously on benazepril, stopped for low blood  pressure   He did have episode of hematuria May 2017 Had a Cystoscopy. No significant pathology per the patient No more hematuria since then     bowel prep for a routine screening colonoscopy which he was at [facility] as an outpatient for on 04/18/15.  he was noted to be in new onset Afib with RVR with HR in the 180's  He was completely asymptomatic.     Past Medical History:  Diagnosis Date   A-fib [facility])    Atrial fibrillation with RVR ([place])    Dupuytren's contracture    Essential hypertension 09/05/2014   Hypertension    Hypokalemia    Hypothyroidism    Malignant neoplasm of prostate ([place]) 09/05/2014   prostate,skin of the scalp   Mitral regurgitation    a. echo 03/2015: echo 03/2015: EF 50-55%, no RWMA, no sufficienct to allow for LV diastolic fxn, mild MR, LA mildly dilated at 46 mm, RV systolic fxn nl, PASP nl   Nephrolithiasis    New onset atrial fibrillation ([place]) 04/18/2015   OSA (obstructive sleep apnea) 05/11/2015   Persistent atrial fibrillation ([facility])    Past Surgical History:  Procedure Laterality Date   COLONOSCOPY WITH PROPOFOL N/A 04/18/2015   Procedure: COLONOSCOPY WITH PROPOFOL;  Surgeon: Lucilla Lame, MD;  Location: [facility] ENDOSCOPY;  Service: Endoscopy;  Laterality: N/A;   ELECTROPHYSIOLOGIC STUDY N/A 05/05/2015   Procedure: CARDIOVERSION;  Surgeon: Wellington Hampshire, MD;  Location: [facility] ORS;  Service: Cardiovascular;  Laterality: N/A;   RADIOACTIVE SEED IMPLANT N/A      Current Meds  Medication Sig   amiodarone (PACERONE) 200 MG tablet Take 1 tablet by mouth once daily   Cholecalciferol (VITAMIN D-3) 125 MCG (5000 UT) TABS Take 5,000 Units by mouth daily.   ELIQUIS 5 MG TABS tablet Take 1 tablet by mouth twice daily   finasteride (PROSCAR) 5 MG tablet Take 1 tablet (5 mg total) by mouth daily.   levothyroxine (EUTHYROX) 75 MCG tablet Take 1 tablet (75 mcg total) by mouth daily.   metoprolol tartrate (LOPRESSOR) 50 MG tablet Take 1 tablet by mouth twice daily   Multiple  Vitamin (MULTIVITAMIN) tablet Take 1 tablet by mouth daily.   Zinc 50 MG TABS Take 50 mg by mouth daily.     Allergies:   Patient has no known allergies.   Social History   Tobacco Use   Smoking status: Former    Types: Cigarettes    Quit date: 09/05/1994    Years since quitting: 26.4   Smokeless tobacco: Former    Types: Chew    Quit date: 09/04/2004  Vaping Use   Vaping Use: Never used  Substance Use Topics   Alcohol use: No   Drug use: No     Family Hx: The patient's family history includes Arthritis in his maternal grandmother; Heart attack in his mother; Heart disease in his father.  ROS:   Please see the history of present illness.    Review of Systems  Constitutional: Negative.   HENT: Negative.    Respiratory: Negative.    Cardiovascular: Negative.   Gastrointestinal: Negative.   Musculoskeletal: Negative.   Neurological: Negative.   Psychiatric/Behavioral: Negative.    All other systems reviewed and are negative.   Labs/Other Tests and Data Reviewed:    Recent Labs: 04/13/2020: TSH 4.200 10/11/2020: ALT 18; BUN 10; Creatinine, Ser 1.26; Hemoglobin 14.8; Platelets 106; Potassium 4.1; Sodium 145   Recent Lipid Panel Lab Results  Component Value Date/Time   CHOL 152 10/11/2020 09:42 AM   TRIG 48 10/11/2020 09:42 AM   HDL 51 10/11/2020 09:42 AM   CHOLHDL 2.7 09/24/2017 10:24 AM   LDLCALC 91 10/11/2020 09:42 AM    Wt Readings from Last 3 Encounters:  01/31/21 226 lb (102.5 kg)  11/09/20 229 lb (103.9 kg)  11/08/20 229 lb 9.6 oz (104.1 kg)     Exam:    BP (!) 150/102 (BP Location: Left Arm, Patient Position: Sitting, Cuff Size: Normal)   Pulse 65   Ht 6\' 1"  (1.854 m)   Wt 226 lb (102.5 kg)   SpO2 98%   BMI 29.82 kg/m  Constitutional:  oriented to person, place, and time. No distress.  HENT:  Head: Grossly normal Eyes:  no discharge. No scleral icterus.  Neck: No JVD, no carotid bruits  Cardiovascular: Regular rate and rhythm, no murmurs  appreciated Pulmonary/Chest: Clear to auscultation bilaterally, no wheezes or rails Abdominal: Soft.  no distension.  no tenderness.  Musculoskeletal: Normal range of motion Neurological:  normal muscle tone. Coordination normal. No atrophy Skin: Skin warm and dry Psychiatric: normal affect, pleasant   ASSESSMENT & PLAN:    Persistent atrial fibrillation Recommend he continue amiodarone, metoprolol, Eliquis Amiodarone surveillance lab work up-to-date Maintaining normal sinus rhythm  Essential hypertension Blood pressure is well controlled on today's visit. No changes made to the medications.  OSA (obstructive sleep apnea) Wears CPAP on a regular basis  Encounter for anticoagulation discussion and monitoring On Eliquis, CBC stable    Total encounter time more than 25 minutes  Greater than 50% was spent in counseling and coordination of care with the patient   Signed, Ida Rogue, MD  01/31/2021 10:04 AM    [place] Office [address], [place], [place] [address]

## 2021-02-09 ENCOUNTER — Other Ambulatory Visit: Payer: Self-pay | Admitting: Cardiovascular Disease

## 2021-02-09 DIAGNOSIS — I4891 Unspecified atrial fibrillation: Secondary | ICD-10-CM

## 2021-02-12 NOTE — Telephone Encounter (Signed)
Eliquis 5mg  refill request received. Patient is 75 years old, weight-102.5kg, Crea-1.26 on 10/11/2020, Diagnosis-Afib, and last seen by Dr. Rockey Situ on 01/31/2021. Dose is appropriate based on dosing criteria. Will send in refill to requested pharmacy.

## 2021-02-12 NOTE — Telephone Encounter (Signed)
Prescription refill request for Eliquis received. Indication: afib  Last office visit: 01/31/2021, [place]  Scr: 1.26, 10/11/2020 Age: 75 yo  Weight: 102.5 kg   Refill sent.

## 2021-02-12 NOTE — Telephone Encounter (Signed)
Refill request

## 2021-04-03 DIAGNOSIS — X32XXXA Exposure to sunlight, initial encounter: Secondary | ICD-10-CM | POA: Diagnosis not present

## 2021-04-03 DIAGNOSIS — Z85828 Personal history of other malignant neoplasm of skin: Secondary | ICD-10-CM | POA: Diagnosis not present

## 2021-04-03 DIAGNOSIS — D485 Neoplasm of uncertain behavior of skin: Secondary | ICD-10-CM | POA: Diagnosis not present

## 2021-04-03 DIAGNOSIS — D0421 Carcinoma in situ of skin of right ear and external auricular canal: Secondary | ICD-10-CM | POA: Diagnosis not present

## 2021-04-03 DIAGNOSIS — D2271 Melanocytic nevi of right lower limb, including hip: Secondary | ICD-10-CM | POA: Diagnosis not present

## 2021-04-03 DIAGNOSIS — L570 Actinic keratosis: Secondary | ICD-10-CM | POA: Diagnosis not present

## 2021-04-03 DIAGNOSIS — D2261 Melanocytic nevi of right upper limb, including shoulder: Secondary | ICD-10-CM | POA: Diagnosis not present

## 2021-04-03 DIAGNOSIS — D2262 Melanocytic nevi of left upper limb, including shoulder: Secondary | ICD-10-CM | POA: Diagnosis not present

## 2021-04-03 DIAGNOSIS — D0439 Carcinoma in situ of skin of other parts of face: Secondary | ICD-10-CM | POA: Diagnosis not present

## 2021-04-13 ENCOUNTER — Ambulatory Visit (INDEPENDENT_AMBULATORY_CARE_PROVIDER_SITE_OTHER): Payer: PPO | Admitting: Nurse Practitioner

## 2021-04-13 ENCOUNTER — Other Ambulatory Visit: Payer: Self-pay

## 2021-04-13 ENCOUNTER — Encounter: Payer: Self-pay | Admitting: Nurse Practitioner

## 2021-04-13 VITALS — BP 138/84 | HR 64 | Temp 98.3°F | Ht 73.0 in | Wt 223.2 lb

## 2021-04-13 DIAGNOSIS — Z8546 Personal history of malignant neoplasm of prostate: Secondary | ICD-10-CM

## 2021-04-13 DIAGNOSIS — I10 Essential (primary) hypertension: Secondary | ICD-10-CM | POA: Diagnosis not present

## 2021-04-13 DIAGNOSIS — Z6830 Body mass index (BMI) 30.0-30.9, adult: Secondary | ICD-10-CM

## 2021-04-13 DIAGNOSIS — N1831 Chronic kidney disease, stage 3a: Secondary | ICD-10-CM | POA: Diagnosis not present

## 2021-04-13 DIAGNOSIS — G4733 Obstructive sleep apnea (adult) (pediatric): Secondary | ICD-10-CM

## 2021-04-13 DIAGNOSIS — E039 Hypothyroidism, unspecified: Secondary | ICD-10-CM

## 2021-04-13 DIAGNOSIS — E6609 Other obesity due to excess calories: Secondary | ICD-10-CM | POA: Diagnosis not present

## 2021-04-13 DIAGNOSIS — E785 Hyperlipidemia, unspecified: Secondary | ICD-10-CM

## 2021-04-13 DIAGNOSIS — D696 Thrombocytopenia, unspecified: Secondary | ICD-10-CM

## 2021-04-13 DIAGNOSIS — I4891 Unspecified atrial fibrillation: Secondary | ICD-10-CM

## 2021-04-13 DIAGNOSIS — I700 Atherosclerosis of aorta: Secondary | ICD-10-CM

## 2021-04-13 DIAGNOSIS — R351 Nocturia: Secondary | ICD-10-CM

## 2021-04-13 DIAGNOSIS — D6869 Other thrombophilia: Secondary | ICD-10-CM

## 2021-04-13 DIAGNOSIS — N401 Enlarged prostate with lower urinary tract symptoms: Secondary | ICD-10-CM

## 2021-04-13 DIAGNOSIS — Z23 Encounter for immunization: Secondary | ICD-10-CM | POA: Diagnosis not present

## 2021-04-13 LAB — MICROALBUMIN, URINE WAIVED
Creatinine, Urine Waived: 300 mg/dL (ref 10–300)
Microalb, Ur Waived: 80 mg/L — ABNORMAL HIGH (ref 0–19)
Microalb/Creat Ratio: <30 mg/g (ref <30)

## 2021-04-13 MED ORDER — FINASTERIDE 5 MG PO TABS: 5.0000 mg | ORAL_TABLET | Freq: Every day | ORAL | 4 refills | Status: DC

## 2021-04-13 MED ORDER — LEVOTHYROXINE SODIUM 75 MCG PO TABS: 75.0000 ug | ORAL_TABLET | Freq: Every day | ORAL | 4 refills | Status: DC

## 2021-04-13 NOTE — Assessment & Plan Note (Signed)
Continue use of CPAP 100% of the time.

## 2021-04-13 NOTE — Patient Instructions (Signed)

## 2021-04-13 NOTE — Progress Notes (Signed)
BP 138/84 (BP Location: Left Arm)    Pulse 64    Temp 98.3 F (36.8 C) (Oral)    Ht 6\' 1"  (1.854 m)    Wt 223 lb 3.2 oz (101.2 kg)    SpO2 99%    BMI 29.45 kg/m    Subjective:    Patient ID: [name] [name], male    DOB: [birth year], 76 y.o.   MRN: [id number]  HPI: [name] [name] is a 76 y.o. male  Chief Complaint  Patient presents with   Chronic Kidney Disease   Sleep Apnea   Hyperlipidemia   Hypertension   Atrial Fibrillation   HYPERTENSION / HYPERLIPIDEMIA Taking Metoprolol 50 MG BID and Amiodarone 200 MG daily. Followed by Dr. Rockey Situ, last saw 01/31/2021 with no changes made.  No medication changes.  Discussed statin therapy with patient and current ASCVD.  Has CPAP and utilizes this at home consistently -- 100% of the time. Satisfied with current treatment? yes Duration of hypertension: chronic BP monitoring frequency: a few times a month BP range: majority below goal <130/80, very rarely higher BP medication side effects: no Duration of hyperlipidemia: chronic Cholesterol medication side effects: no Cholesterol supplements: none Medication compliance: good compliance Aspirin: no Recent stressors: no Recurrent headaches: no Visual changes: no Palpitations: no Dyspnea: no Chest pain: no Lower extremity edema: no Dizzy/lightheaded: no  The 10-year ASCVD risk score (Arnett DK, et al., 2019) is: 31.1%   Values used to calculate the score:     Age: 72 years     Sex: Male     Is Non-Hispanic African American: No     Diabetic: No     Tobacco smoker: No     Systolic Blood Pressure: 297 mmHg     Is BP treated: Yes     HDL Cholesterol: 51 mg/dL     Total Cholesterol: 152 mg/dL   ATRIAL FIBRILLATION Continues on Amiodarone 200 MG daily, Metoprolol 50 MG BID, and Apixaban 5 MG BID. Atrial fibrillation status: stable Satisfied with current treatment: yes  Medication side effects:  no Medication compliance: good compliance Etiology of atrial fibrillation:   Palpitations:  no Chest pain:  no Dyspnea on exertion:  no Orthopnea:  no Syncope:  no Edema:  no Ventricular rate control: B-blocker Anti-coagulation: long acting  BPH Takes Finasteride -- history of prostate CA. BPH status: stable Satisfied with current treatment?: yes Medication side effects: no Medication compliance: good compliance Duration: chronic Nocturia: 2-3x per night Urinary frequency:no Incomplete voiding: no Urgency: no Weak urinary stream:  occasional Straining to start stream: no Dysuria: no Onset: gradual Severity: mild Alleviating factors: Finasteride Aggravating factors: unknown Treatments attempted:  IPSS Questionnaire (AUA-7): 4 score Over the past month   1)  How often have you had a sensation of not emptying your bladder completely after you finish urinating?  0 - Not at all  2)  How often have you had to urinate again less than two hours after you finished urinating? 0 - Not at all  3)  How often have you found you stopped and started again several times when you urinated?  0 - Not at all  4) How difficult have you found it to postpone urination?  0 - Not at all  5) How often have you had a weak urinary stream?  1 - Less than 1 time in 5  6) How often have you had to push or strain to begin urination?  0 - Not at all  7) How many times did you most typically get up to urinate from the time you went to bed until the time you got up in the morning?  3 - 3 times  Total score:  0-7 mildly symptomatic   8-19 moderately symptomatic   20-35 severely symptomatic      HYPOTHYROIDISM Continues on Levothyroxine 75 MCG. Thyroid control status:stable Satisfied with current treatment? yes Medication side effects: no Medication compliance: good compliance Etiology of hypothyroidism:  Recent dose adjustment:yes Fatigue: no Cold intolerance: no Heat intolerance: no Weight gain: no Weight loss: no Constipation: no Diarrhea/loose stools:  no Palpitations: no Lower extremity edema: no Anxiety/depressed mood: no  CHRONIC KIDNEY DISEASE CKD status: stable Medications renally dose: yes Previous renal evaluation: no Pneumovax:  Up to Date Influenza Vaccine:  Up to Date  Relevant past medical, surgical, family and social history reviewed and updated as indicated. Interim medical history since our last visit reviewed. Allergies and medications reviewed and updated.  Review of Systems  Constitutional:  Negative for activity change, diaphoresis, fatigue and fever.  Respiratory:  Negative for cough, chest tightness, shortness of breath and wheezing.   Cardiovascular:  Negative for chest pain, palpitations and leg swelling.  Gastrointestinal: Negative.   Endocrine: Negative.   Neurological: Negative.   Psychiatric/Behavioral: Negative.     Per HPI unless specifically indicated above     Objective:    BP 138/84 (BP Location: Left Arm)    Pulse 64    Temp 98.3 F (36.8 C) (Oral)    Ht 6\' 1"  (1.854 m)    Wt 223 lb 3.2 oz (101.2 kg)    SpO2 99%    BMI 29.45 kg/m   Wt Readings from Last 3 Encounters:  04/13/21 223 lb 3.2 oz (101.2 kg)  01/31/21 226 lb (102.5 kg)  11/09/20 229 lb (103.9 kg)    Physical Exam Vitals and nursing note reviewed.  Constitutional:      General: He is awake. He is not in acute distress.    Appearance: He is well-developed, well-groomed and overweight. He is not ill-appearing.  HENT:     Head: Normocephalic and atraumatic.     Right Ear: Hearing normal. No drainage.     Left Ear: Hearing normal. No drainage.  Eyes:     General: Lids are normal.        Right eye: No discharge.        Left eye: No discharge.     Conjunctiva/sclera: Conjunctivae normal.     Pupils: Pupils are equal, round, and reactive to light.  Neck:     Thyroid: No thyromegaly.     Vascular: No carotid bruit.     Trachea: Trachea normal.  Cardiovascular:     Rate and Rhythm: Normal rate and regular rhythm.     Heart  sounds: Normal heart sounds, S1 normal and S2 normal. No murmur heard.   No gallop.  Pulmonary:     Effort: Pulmonary effort is normal. No accessory muscle usage or respiratory distress.     Breath sounds: Normal breath sounds.  Abdominal:     General: Bowel sounds are normal.     Palpations: Abdomen is soft.     Tenderness: There is no abdominal tenderness.  Musculoskeletal:        General: Normal range of motion.     Cervical back: Normal range of motion and neck supple.     Right lower leg: No edema.     Left lower leg: No  edema.  Skin:    General: Skin is warm and dry.     Capillary Refill: Capillary refill takes less than 2 seconds.  Neurological:     Mental Status: He is alert and oriented to person, place, and time.     Deep Tendon Reflexes: Reflexes are normal and symmetric.  Psychiatric:        Attention and Perception: Attention normal.        Mood and Affect: Mood normal.        Speech: Speech normal.        Behavior: Behavior normal. Behavior is cooperative.        Thought Content: Thought content normal.   Results for orders placed or performed in visit on 11/09/20  Microscopic Examination   Urine  Result Value Ref Range   WBC, UA 0-5 0 - 5 /hpf   RBC 0-2 0 - 2 /hpf   Epithelial Cells (non renal) None seen 0 - 10 /hpf   Bacteria, UA None seen None seen/Few  Urinalysis, Complete  Result Value Ref Range   Specific Gravity, UA 1.015 1.005 - 1.030   pH, UA 7.5 5.0 - 7.5   Color, UA Yellow Yellow   Appearance Ur Clear Clear   Leukocytes,UA Negative Negative   Protein,UA Negative Negative/Trace   Glucose, UA Negative Negative   Ketones, UA Negative Negative   RBC, UA Negative Negative   Bilirubin, UA Negative Negative   Urobilinogen, Ur 0.2 0.2 - 1.0 mg/dL   Nitrite, UA Negative Negative   Microscopic Examination See below:       Assessment & Plan:   Problem List Items Addressed This Visit       Cardiovascular and Mediastinum   Essential hypertension  (Chronic)    Chronic, ongoing with initial elevation in BP, but repeat at goal and home readings almost consistently <130/80.  Continue current medication regimen and adjust as needed.  Recommend checking BP three mornings a week and documenting + focus on DASH diet.  Continue collaboration with cardiology.  CMP, CBC, urine ALB today.  Return in 6 months.      Relevant Orders   CBC with Differential/Platelet   Comprehensive metabolic panel   Microalbumin, Urine Waived   Aortic atherosclerosis (HCC)    Noted on CT scan 11/02/20 -- have recommended statin initiation for prevention and reviewed imaging and ASCVD scoring with him.  He will think about this.      Relevant Orders   Lipid Panel w/o Chol/HDL Ratio   Atrial fibrillation with RVR (HCC) - Primary    Chronic, stable rate control.  Continue current medication regimen and collaboration with caridology.  Labs today.      Relevant Orders   Comprehensive metabolic panel     Respiratory   OSA (obstructive sleep apnea)    Continue use of CPAP 100% of the time.        Endocrine   Hypothyroidism (Chronic)    Chronic, ongoing.  Continue current medication regimen and adjust as needed.  TSH and Free T4 + thyroid antibody today, monitor closely as is also taking Amiodarone.      Relevant Medications   levothyroxine (EUTHYROX) 75 MCG tablet   Other Relevant Orders   T4, free   Thyroid peroxidase antibody   TSH     Genitourinary   CKD (chronic kidney disease), stage III ([place])    Noted on past labs on and off since 2019.  Monitor closely and recommend adequate hydration at home +  avoid NSAIDs.  CMP and urine ALB today.  Refer to nephrology as needed.      Relevant Orders   Comprehensive metabolic panel   Microalbumin, Urine Waived     Hematopoietic and Hemostatic   Other thrombophilia ([place])    On Eliquis and has A-Fib.  Continue to monitor for any bleeding or wounds.  CBC today.      Relevant Orders   CBC with  Differential/Platelet   Thrombocytopenia ([place])    Noted past labs for years ranging from 106 to 146.  Monitor closely and send to hematology as needed.      Relevant Orders   CBC with Differential/Platelet     Other   BPH associated with nocturia    AUA 4, history of prostate CA.  Continue Finasteride as tolerating well. Sees urology as needed.  PSA today.      Relevant Orders   PSA   History of prostate cancer    No current symptoms, check PSA today      Relevant Orders   PSA   Hyperlipidemia LDL goal <70    No current statin, discussed at length current levels and ASCVD 31.1%.  He wishes to further discuss with cardiology.  If statin initiated would need to be low dose and assess closely as takes Amiodarone.  Lipid check today.      Relevant Orders   Comprehensive metabolic panel   Lipid Panel w/o Chol/HDL Ratio   Obesity    BMI 29.45 today, some loss.  Recommended eating smaller high protein, low fat meals more frequently and exercising 30 mins a day 5 times a week with a goal of 10-15lb weight loss in the next 3 months. Patient voiced their understanding and motivation to adhere to these recommendations.       Other Visit Diagnoses     Flu vaccine need       High dose flu today.   Relevant Orders   Flu Vaccine QUAD High Dose(Fluad)        Follow up plan: Return in about 6 months (around 10/11/2021) for HTN/HLD, A-FIB, CKD, BPH.

## 2021-04-13 NOTE — Assessment & Plan Note (Signed)
Noted on past labs on and off since 2019.  Monitor closely and recommend adequate hydration at home + avoid NSAIDs.  CMP and urine ALB today.  Refer to nephrology as needed.

## 2021-04-13 NOTE — Assessment & Plan Note (Signed)
Chronic, ongoing with initial elevation in BP, but repeat at goal and home readings almost consistently <130/80.  Continue current medication regimen and adjust as needed.  Recommend checking BP three mornings a week and documenting + focus on DASH diet.  Continue collaboration with cardiology.  CMP, CBC, urine ALB today.  Return in 6 months.

## 2021-04-13 NOTE — Assessment & Plan Note (Addendum)
No current statin, discussed at length current levels and ASCVD 31.1%.  He wishes to further discuss with cardiology.  If statin initiated would need to be low dose and assess closely as takes Amiodarone.  Lipid check today.

## 2021-04-13 NOTE — Assessment & Plan Note (Signed)
On Eliquis and has A-Fib.  Continue to monitor for any bleeding or wounds.  CBC today. 

## 2021-04-13 NOTE — Assessment & Plan Note (Signed)
BMI 29.45 today, some loss.  Recommended eating smaller high protein, low fat meals more frequently and exercising 30 mins a day 5 times a week with a goal of 10-15lb weight loss in the next 3 months. Patient voiced their understanding and motivation to adhere to these recommendations.

## 2021-04-13 NOTE — Assessment & Plan Note (Signed)
Noted past labs for years ranging from 106 to 146.  Monitor closely and send to hematology as needed.

## 2021-04-13 NOTE — Assessment & Plan Note (Signed)
AUA 4, history of prostate CA.  Continue Finasteride as tolerating well. Sees urology as needed.  PSA today.

## 2021-04-13 NOTE — Assessment & Plan Note (Signed)
Chronic, ongoing.  Continue current medication regimen and adjust as needed.  TSH and Free T4 + thyroid antibody today, monitor closely as is also taking Amiodarone.

## 2021-04-13 NOTE — Assessment & Plan Note (Signed)
No current symptoms, check PSA today

## 2021-04-13 NOTE — Assessment & Plan Note (Signed)
Chronic, stable rate control.  Continue current medication regimen and collaboration with caridology.  Labs today.

## 2021-04-13 NOTE — Assessment & Plan Note (Signed)
Noted on CT scan 11/02/20 -- have recommended statin initiation for prevention and reviewed imaging and ASCVD scoring with him.  He will think about this.

## 2021-04-14 LAB — CBC WITH DIFFERENTIAL/PLATELET
Basophils Absolute: 0.1 10*3/uL (ref 0.0–0.2)
Basos: 1 %
EOS (ABSOLUTE): 0 10*3/uL (ref 0.0–0.4)
Eos: 1 %
Hematocrit: 47 % (ref 37.5–51.0)
Hemoglobin: 15.2 g/dL (ref 13.0–17.7)
Immature Grans (Abs): 0 10*3/uL (ref 0.0–0.1)
Immature Granulocytes: 0 %
Lymphocytes Absolute: 1.5 10*3/uL (ref 0.7–3.1)
Lymphs: 27 %
MCH: 30.3 pg (ref 26.6–33.0)
MCHC: 32.3 g/dL (ref 31.5–35.7)
MCV: 94 fL (ref 79–97)
Monocytes Absolute: 1 10*3/uL — ABNORMAL HIGH (ref 0.1–0.9)
Monocytes: 19 %
Neutrophils Absolute: 2.8 10*3/uL (ref 1.4–7.0)
Neutrophils: 52 %
Platelets: 142 10*3/uL — ABNORMAL LOW (ref 150–450)
RBC: 5.02 x10E6/uL (ref 4.14–5.80)
RDW: 13.8 % (ref 11.6–15.4)
WBC: 5.4 10*3/uL (ref 3.4–10.8)

## 2021-04-14 LAB — COMPREHENSIVE METABOLIC PANEL
ALT: 21 IU/L (ref 0–44)
AST: 23 IU/L (ref 0–40)
Albumin/Globulin Ratio: 2.6 — ABNORMAL HIGH (ref 1.2–2.2)
Albumin: 4.5 g/dL (ref 3.7–4.7)
Alkaline Phosphatase: 125 IU/L — ABNORMAL HIGH (ref 44–121)
BUN/Creatinine Ratio: 13 (ref 10–24)
BUN: 17 mg/dL (ref 8–27)
Bilirubin Total: 1.3 mg/dL — ABNORMAL HIGH (ref 0.0–1.2)
CO2: 27 mmol/L (ref 20–29)
Calcium: 9.1 mg/dL (ref 8.6–10.2)
Chloride: 105 mmol/L (ref 96–106)
Creatinine, Ser: 1.33 mg/dL — ABNORMAL HIGH (ref 0.76–1.27)
Globulin, Total: 1.7 g/dL (ref 1.5–4.5)
Glucose: 101 mg/dL — ABNORMAL HIGH (ref 70–99)
Potassium: 3.9 mmol/L (ref 3.5–5.2)
Sodium: 145 mmol/L — ABNORMAL HIGH (ref 134–144)
Total Protein: 6.2 g/dL (ref 6.0–8.5)
eGFR: 55 mL/min/{1.73_m2} — ABNORMAL LOW (ref >59)

## 2021-04-14 LAB — LIPID PANEL W/O CHOL/HDL RATIO
Cholesterol, Total: 148 mg/dL (ref 100–199)
HDL: 51 mg/dL (ref >39)
LDL Chol Calc (NIH): 88 mg/dL (ref 0–99)
Triglycerides: 38 mg/dL (ref 0–149)
VLDL Cholesterol Cal: 9 mg/dL (ref 5–40)

## 2021-04-14 LAB — TSH: TSH: 3.3 u[IU]/mL (ref 0.450–4.500)

## 2021-04-14 LAB — T4, FREE: Free T4: 2.2 ng/dL — ABNORMAL HIGH (ref 0.82–1.77)

## 2021-04-14 LAB — THYROID PEROXIDASE ANTIBODY: Thyroperoxidase Ab SerPl-aCnc: <9 IU/mL (ref 0–34)

## 2021-04-14 LAB — PSA: Prostate Specific Ag, Serum: <0.1 ng/mL (ref 0.0–4.0)

## 2021-04-14 NOTE — Progress Notes (Signed)
Contacted via [place] morning [place], your labs have returned: - Thyroid labs show a normal TSH and antibody, Free T4 means a bit elevated -- at this time to avoid pushing you to more hyperthyroid level we will continue your Levothyroxine at current dosing and monitor. - CBC shows no anemia.  Platelets remain a little on lower side, but no worsening.  We will monitor. - Kidney function, creatinine and eGFR, continues to show some mild kidney disease and sodium a little elevated.  Please cut back on salt intake and increase water intake a little bit.  Liver function, AST and ALT, is normal. - Cholesterol levels are stable, however I do continue to recommend a low dose statin to help improve and decrease cardiac risk. - Prostate lab remains at normal level.  Any questions? Keep being awesome!!  Thank you for allowing me to participate in your care.  I appreciate you. Kindest regards, Lynze Reddy

## 2021-04-27 ENCOUNTER — Other Ambulatory Visit: Payer: Self-pay | Admitting: Cardiovascular Disease

## 2021-05-18 ENCOUNTER — Other Ambulatory Visit: Payer: Self-pay | Admitting: Nurse Practitioner

## 2021-05-18 ENCOUNTER — Other Ambulatory Visit: Payer: Self-pay | Admitting: Cardiovascular Disease

## 2021-05-18 NOTE — Telephone Encounter (Signed)
rx was sent in 04/13/21 #90/4 ?Requested Prescriptions  ?Refused Prescriptions Disp Refills  ?? levothyroxine (SYNTHROID) 75 MCG tablet [Pharmacy Med Name: Levothyroxine Sodium 75 MCG Oral Tablet] 90 tablet 0  ?  Sig: Take 1 tablet by mouth once daily  ?  ? Endocrinology:  Hypothyroid Agents Passed - 05/18/2021  9:42 AM  ?  ?  Passed - TSH in normal range and within 360 days  ?  TSH  ?Date Value Ref Range Status  ?04/13/2021 3.300 0.450 - 4.500 uIU/mL Final  ?   ?  ?  Passed - Valid encounter within last 12 months  ?  Recent Outpatient Visits   ?      ? 1 month ago Atrial fibrillation with RVR ([place])  ? [facility] [place], [place] T, NP  ? 6 months ago Asymptomatic microscopic hematuria  ? [place], [name] T, NP  ? 7 months ago Atrial fibrillation with RVR ([place])  ? [facility] [place], [place] T, NP  ? 9 months ago Bilateral hearing loss due to cerumen impaction  ? [place], [place] T, NP  ? 1 year ago Atrial fibrillation with RVR ([place])  ? [facility] [place], Henrine Screws T, NP  ?  ?  ?Future Appointments   ?        ? In 4 months  [facility], [facility]  ? In 4 months [place], Barbaraann Faster, NP [employer], [facility]  ?  ? ?  ?  ?  ? ? ?

## 2021-05-31 DIAGNOSIS — D0439 Carcinoma in situ of skin of other parts of face: Secondary | ICD-10-CM | POA: Diagnosis not present

## 2021-05-31 DIAGNOSIS — L905 Scar conditions and fibrosis of skin: Secondary | ICD-10-CM | POA: Diagnosis not present

## 2021-06-07 DIAGNOSIS — D0421 Carcinoma in situ of skin of right ear and external auricular canal: Secondary | ICD-10-CM | POA: Diagnosis not present

## 2021-07-12 ENCOUNTER — Other Ambulatory Visit: Payer: Self-pay | Admitting: Cardiovascular Disease

## 2021-08-08 ENCOUNTER — Other Ambulatory Visit: Payer: Self-pay | Admitting: Cardiovascular Disease

## 2021-08-08 DIAGNOSIS — I4891 Unspecified atrial fibrillation: Secondary | ICD-10-CM

## 2021-08-08 NOTE — Telephone Encounter (Signed)
Prescription refill request for Eliquis received. Indication: Atrial Fib Last office visit: 01/31/21  Johnny Bridge MD Scr: 1.33 on 04/13/21 Age: 76 Weight: 102.5kg  Based on above findings Eliquis '5mg'$  twice daily is the appropriate dose.  Refill approved.

## 2021-09-12 ENCOUNTER — Other Ambulatory Visit: Payer: Self-pay | Admitting: Cardiovascular Disease

## 2021-10-05 ENCOUNTER — Ambulatory Visit: Payer: PPO

## 2021-10-07 NOTE — Patient Instructions (Signed)
Chronic Kidney Disease, Adult Chronic kidney disease is when lasting damage happens to the kidneys slowly over a long time. The kidneys help to: Make pee (urine). Make hormones. Keep the right amount of fluids and chemicals in the body. Most often, this disease does not go away. You must take steps to help keep the kidney damage from getting worse. If steps are not taken, the kidneys might stop working forever. What are the causes? Diabetes. High blood pressure. Diseases that affect the heart and blood vessels. Other kidney diseases. Diseases of the body's disease-fighting system. A problem with the flow of pee. Infections of the organs that make pee, store it, and take it out of the body. Swelling or irritation of your blood vessels. What increases the risk? Getting older. Having someone in your family who has kidney disease or kidney failure. Having a disease caused by genes. Taking medicines often that harm the kidneys. Being near or having contact with harmful substances. Being very overweight. Using tobacco now or in the past. What are the signs or symptoms? Feeling very tired. Having a swollen face, legs, ankles, or feet. Feeling like you may vomit or vomiting. Not feeling hungry. Being confused or not able to focus. Twitches and cramps in the leg muscles or other muscles. Dry, itchy skin. A taste of metal in your mouth. Making less pee, or making more pee. Shortness of breath. Trouble sleeping. You may also become anemic or get weak bones. Anemic means there is not enough red blood cells or hemoglobin in your blood. You may get symptoms slowly. You may not notice them until the kidney damage gets very bad. How is this treated? Often, there is no cure for this disease. Treatment can help with symptoms and help keep the disease from getting worse. You may need to: Avoid alcohol. Avoid foods that are high in salt, potassium, phosphorous, and protein. Take medicines for  symptoms and to help control other conditions. Have dialysis. This treatment gets harmful waste out of your body. Treat other problems that cause your kidney disease or make it worse. Follow these instructions at home: Medicines Take over-the-counter and prescription medicines only as told by your doctor. Do not take any new medicines, vitamins, or supplements unless your doctor says it is okay. Lifestyle  Do not smoke or use any products that contain nicotine or tobacco. If you need help quitting, ask your doctor. If you drink alcohol: Limit how much you use to: 0-1 drink a day for women who are not pregnant. 0-2 drinks a day for men. Know how much alcohol is in your drink. In the [place] one drink equals one 12 oz bottle of beer (355 mL), one 5 oz glass of wine (148 mL), or one 1 oz glass of hard liquor (44 mL). Stay at a healthy weight. If you need help losing weight, ask your doctor. General instructions  Follow instructions from your doctor about what you cannot eat or drink. Track your blood pressure at home. Tell your doctor about any changes. If you have diabetes, track your blood sugar. Exercise at least 30 minutes a day, 5 days a week. Keep your shots (vaccinations) up to date. Keep all follow-up visits. Where to find more information American Association of Kidney Patients: www.aakp.org National Kidney Foundation: www.kidney.org American Kidney Fund: www.akfinc.org Life Options: www.lifeoptions.org Kidney School: www.kidneyschool.org Contact a doctor if: Your symptoms get worse. You get new symptoms. Get help right away if: You get symptoms of end-stage kidney disease. These   include: Headaches. Losing feeling in your hands or feet. Easy bruising. Having hiccups often. Chest pain. Shortness of breath. Lack of menstrual periods, in women. You have a fever. You make less pee than normal. You have pain or you bleed when you pee or poop. These symptoms may be an  emergency. Get help right away. Call your local emergency services (911 in the [place] Do not wait to see if the symptoms will go away. Do not drive yourself to the hospital. Summary Chronic kidney disease is when lasting damage happens to the kidneys slowly over a long time. Causes of this disease include diabetes and high blood pressure. Often, there is no cure for this disease. Treatment can help symptoms and help keep the disease from getting worse. Treatment may involve lifestyle changes, medicines, and dialysis. This information is not intended to replace advice given to you by your health care provider. Make sure you discuss any questions you have with your health care provider. Document Revised: 06/09/2019 Document Reviewed: 06/09/2019 Elsevier Patient Education  2023 Elsevier Inc.  

## 2021-10-11 ENCOUNTER — Ambulatory Visit (INDEPENDENT_AMBULATORY_CARE_PROVIDER_SITE_OTHER): Payer: PPO | Admitting: Nurse Practitioner

## 2021-10-11 ENCOUNTER — Encounter: Payer: Self-pay | Admitting: Nurse Practitioner

## 2021-10-11 VITALS — BP 148/72 | HR 56 | Temp 97.8°F | Ht 73.0 in | Wt 223.2 lb

## 2021-10-11 DIAGNOSIS — Z6829 Body mass index (BMI) 29.0-29.9, adult: Secondary | ICD-10-CM | POA: Diagnosis not present

## 2021-10-11 DIAGNOSIS — E785 Hyperlipidemia, unspecified: Secondary | ICD-10-CM | POA: Diagnosis not present

## 2021-10-11 DIAGNOSIS — I4891 Unspecified atrial fibrillation: Secondary | ICD-10-CM | POA: Diagnosis not present

## 2021-10-11 DIAGNOSIS — N1831 Chronic kidney disease, stage 3a: Secondary | ICD-10-CM

## 2021-10-11 DIAGNOSIS — M720 Palmar fascial fibromatosis [Dupuytren]: Secondary | ICD-10-CM | POA: Diagnosis not present

## 2021-10-11 DIAGNOSIS — D6869 Other thrombophilia: Secondary | ICD-10-CM | POA: Diagnosis not present

## 2021-10-11 DIAGNOSIS — R3121 Asymptomatic microscopic hematuria: Secondary | ICD-10-CM

## 2021-10-11 DIAGNOSIS — D696 Thrombocytopenia, unspecified: Secondary | ICD-10-CM | POA: Diagnosis not present

## 2021-10-11 DIAGNOSIS — I10 Essential (primary) hypertension: Secondary | ICD-10-CM

## 2021-10-11 DIAGNOSIS — E039 Hypothyroidism, unspecified: Secondary | ICD-10-CM

## 2021-10-11 DIAGNOSIS — I700 Atherosclerosis of aorta: Secondary | ICD-10-CM | POA: Diagnosis not present

## 2021-10-11 DIAGNOSIS — G4733 Obstructive sleep apnea (adult) (pediatric): Secondary | ICD-10-CM | POA: Diagnosis not present

## 2021-10-11 DIAGNOSIS — Z8546 Personal history of malignant neoplasm of prostate: Secondary | ICD-10-CM

## 2021-10-11 DIAGNOSIS — E6609 Other obesity due to excess calories: Secondary | ICD-10-CM

## 2021-10-11 LAB — MICROSCOPIC EXAMINATION
Bacteria, UA: NONE SEEN
Epithelial Cells (non renal): NONE SEEN /hpf (ref 0–10)
RBC, Urine: >30 /hpf — ABNORMAL HIGH (ref 0–2)
WBC, UA: NONE SEEN /hpf (ref 0–5)

## 2021-10-11 LAB — URINALYSIS, ROUTINE W REFLEX MICROSCOPIC
Bilirubin, UA: NEGATIVE
Glucose, UA: NEGATIVE
Ketones, UA: NEGATIVE
Leukocytes,UA: NEGATIVE
Nitrite, UA: NEGATIVE
Specific Gravity, UA: >1.03 — ABNORMAL HIGH (ref 1.005–1.030)
Urobilinogen, Ur: 1 mg/dL (ref 0.2–1.0)
pH, UA: 5.5 (ref 5.0–7.5)

## 2021-10-11 MED ORDER — OLMESARTAN MEDOXOMIL 5 MG PO TABS: 10.0000 mg | ORAL_TABLET | Freq: Every day | ORAL | 4 refills | Status: DC

## 2021-10-11 NOTE — Assessment & Plan Note (Signed)
Chronic, ongoing.  Continue current medication regimen and adjust as needed. Thyroid labs up to date, monitor closely as is also taking Amiodarone.

## 2021-10-11 NOTE — Assessment & Plan Note (Signed)
Chronic, ongoing with elevation above goal on initial and repeat + elevations occasionally at home.  Continue current medication regimen, but add on Olmesartan 10 MG daily for BP and kidney protection with CKD, and adjust as needed.  Educated him on this medication use and side effects to monitor for.  Recommend checking BP three mornings a week and documenting + focus on DASH diet.  Continue collaboration with cardiology.  CBC and CMP today.  Return in 4 weeks.

## 2021-10-11 NOTE — Assessment & Plan Note (Signed)
BMI 29.45.  Recommended eating smaller high protein, low fat meals more frequently and exercising 30 mins a day 5 times a week with a goal of 10-15lb weight loss in the next 3 months. Patient voiced their understanding and motivation to adhere to these recommendations.

## 2021-10-11 NOTE — Assessment & Plan Note (Signed)
Refer to history of prostate cancer plan of care.

## 2021-10-11 NOTE — Assessment & Plan Note (Signed)
History of with seed placement, currently having hematuria issues again.  3+ BLD on UA.  Will check PSA.  Hold Eliquis for 3 days at this time and ensure good hydration.  He will return to urology if ongoing or worsening presents, discussed with him today.

## 2021-10-11 NOTE — Assessment & Plan Note (Signed)
Ongoing and stable.  Continue use of CPAP 100% of the time.

## 2021-10-11 NOTE — Assessment & Plan Note (Signed)
Present to left 5th finger, will place referral to ortho for possible injection to area.

## 2021-10-11 NOTE — Assessment & Plan Note (Addendum)
Ongoing.  Seen on CT scan 11/02/20 -- have recommended statin initiation for prevention and reviewed imaging and ASCVD scoring with him.  He wishes to think about this and repeat lipid panel.

## 2021-10-11 NOTE — Assessment & Plan Note (Signed)
On Eliquis and has A-Fib.  Continue to monitor for any bleeding or wounds.  CBC today.

## 2021-10-11 NOTE — Progress Notes (Signed)
BP (!) 148/72 (BP Location: Left Arm)   Pulse (!) 56   Temp 97.8 F (36.6 C) (Oral)   Ht '6\' 1"'$  (1.854 m)   Wt 223 lb 3.2 oz (101.2 kg)   SpO2 99%   BMI 29.45 kg/m    Subjective:    Patient ID: [name] [name], male    DOB: 05/30/1945, 76 y.o.   MRN: [id number]  HPI: [name] [name] is a 76 y.o. male  Chief Complaint  Patient presents with   Hyperlipidemia   Hypertension   Atrial Fibrillation   Chronic Kidney Disease   Benign Prostatic Hypertrophy   Hematuria    Patient says he has noticed blood in his urine. Patient says it has been off and on since the beginning of the month. Patient says he was turned loose from his Urologist office last year. Patient says he was seen for a similar situation.    Finger Problem    Patient says the pinky finger on his L hand is giving him issues of not wanting to straighten out all the way and can be painful when trying to straighten out.    HYPERTENSION / HYPERLIPIDEMIA Taking Metoprolol 50 MG BID and Amiodarone 200 MG daily. Followed by Dr. Rockey Situ, last saw 01/31/2021 with no changes made at time.  Discussed statin therapy with patient and current ASCVD.  Does endorse enjoying salty foods at home.  Has CPAP and utilizes this at home consistently -- 100% of the time. Satisfied with current treatment? yes Duration of hypertension: chronic BP monitoring frequency: a few times a month BP range: 117/75 to 147/86 BP medication side effects: no Duration of hyperlipidemia: chronic Cholesterol medication side effects: no Cholesterol supplements: none Medication compliance: good compliance Aspirin: no Recent stressors: no Recurrent headaches: no Visual changes: no Palpitations: no Dyspnea: no Chest pain: no Lower extremity edema: no Dizzy/lightheaded: no  The 10-year ASCVD risk score (Arnett DK, et al., 2019) is: 34.2%   Values used to calculate the score:     Age: 56 years     Sex: Male     Is Non-Hispanic African American: No      Diabetic: No     Tobacco smoker: No     Systolic Blood Pressure: 875 mmHg     Is BP treated: Yes     HDL Cholesterol: 51 mg/dL     Total Cholesterol: 148 mg/dL  ATRIAL FIBRILLATION Taking Amiodarone 200 MG daily, Metoprolol 50 MG BID, and Apixaban 5 MG BID.  Followed by cardiology, has not scheduled as of yet for this year. Atrial fibrillation status: stable Satisfied with current treatment: yes  Medication side effects:  no Medication compliance: good compliance Etiology of atrial fibrillation:  Palpitations:  no Chest pain:  no Dyspnea on exertion:  no Orthopnea:  no Syncope:  no Edema:  no Ventricular rate control: B-blocker Anti-coagulation: long acting  CHRONIC KIDNEY DISEASE Continues to be stable on labs. CKD status: controlled Medications renally dose: yes Previous renal evaluation: yes Pneumovax:  refused Influenza Vaccine:  Up to Date   BPH Takes Finasteride -- history of prostate CA (received implants - seeds).  Started to have blood in urine at first of month again, different then "last time" -- previously was a lot of blood, but this time was lighter and passing lots of "stuff" in January -- he held Eliquis and this cleared.  Then recently started again, he held Eliquis but it continues to be very light.  Last saw Dr.  Sninsky 11/09/20 where it was felt possibly due to seeds.  He denies any pain. BPH status: stable Satisfied with current treatment?: yes Medication side effects: no Medication compliance: good compliance Duration: chronic Nocturia: 2-3x per night Urinary frequency:no Incomplete voiding: no Urgency: no Weak urinary stream: varies Straining to start stream: no Dysuria: no Onset: gradual Severity: mild Alleviating factors: Finasteride Aggravating factors: unknown Treatments attempted:  IPSS Questionnaire (AUA-7): 7 AUA Over the past month.   1)  How often have you had a sensation of not emptying your bladder completely after you finish  urinating?  1 - Less than 1 time in 5  2)  How often have you had to urinate again less than two hours after you finished urinating? 0 - Not at all  3)  How often have you found you stopped and started again several times when you urinated?  1 - Less than 1 time in 5  4) How difficult have you found it to postpone urination?  0 - Not at all  5) How often have you had a weak urinary stream?  2 - Less than half the time  6) How often have you had to push or strain to begin urination?  0 - Not at all  7) How many times did you most typically get up to urinate from the time you went to bed until the time you got up in the morning?  3 - 3 times  Total score:  0-7 mildly symptomatic   8-19 moderately symptomatic   20-35 severely symptomatic   HYPOTHYROIDISM Continues on Levothyroxine 75 MCG. Thyroid control status:stable Satisfied with current treatment? yes Medication side effects: no Medication compliance: good compliance Etiology of hypothyroidism:  Recent dose adjustment:yes Fatigue: no Cold intolerance: no Heat intolerance: no Weight gain: no Weight loss: no Constipation: no Diarrhea/loose stools: no Palpitations: no Lower extremity edema: no Anxiety/depressed mood: no  FINGER ISSUES History of Dupuytren's contracture -- currently present to left 5th finger.  Started 2-3 weeks ago. Duration: weeks Involved hand: left Mechanism of injury: unknown Location: 5th finger Treatments attempted: none Relief with NSAIDs?: No NSAIDs Taken Weakness: no Numbness: no Redness: no Swelling:no Bruising: no Fevers: no   Relevant past medical, surgical, family and social history reviewed and updated as indicated. Interim medical history since our last visit reviewed. Allergies and medications reviewed and updated.  Review of Systems  Constitutional:  Negative for activity change, diaphoresis, fatigue and fever.  Respiratory:  Negative for cough, chest tightness, shortness of breath and  wheezing.   Cardiovascular:  Negative for chest pain, palpitations and leg swelling.  Gastrointestinal: Negative.   Endocrine: Negative.   Genitourinary:  Positive for hematuria. Negative for dysuria, frequency and urgency.  Musculoskeletal:  Positive for arthralgias.  Neurological: Negative.   Psychiatric/Behavioral: Negative.      Per HPI unless specifically indicated above     Objective:    BP (!) 148/72 (BP Location: Left Arm)   Pulse (!) 56   Temp 97.8 F (36.6 C) (Oral)   Ht '6\' 1"'$  (1.854 m)   Wt 223 lb 3.2 oz (101.2 kg)   SpO2 99%   BMI 29.45 kg/m   Wt Readings from Last 3 Encounters:  10/11/21 223 lb 3.2 oz (101.2 kg)  04/13/21 223 lb 3.2 oz (101.2 kg)  01/31/21 226 lb (102.5 kg)    Physical Exam Vitals and nursing note reviewed.  Constitutional:      General: He is awake. He is not in acute distress.  Appearance: He is well-developed, well-groomed and overweight. He is not ill-appearing.  HENT:     Head: Normocephalic and atraumatic.     Right Ear: Hearing normal. No drainage.     Left Ear: Hearing normal. No drainage.  Eyes:     General: Lids are normal.        Right eye: No discharge.        Left eye: No discharge.     Conjunctiva/sclera: Conjunctivae normal.     Pupils: Pupils are equal, round, and reactive to light.  Neck:     Thyroid: No thyromegaly.     Vascular: No carotid bruit.     Trachea: Trachea normal.  Cardiovascular:     Rate and Rhythm: Normal rate and regular rhythm.     Heart sounds: Normal heart sounds, S1 normal and S2 normal. No murmur heard.    No gallop.  Pulmonary:     Effort: Pulmonary effort is normal. No accessory muscle usage or respiratory distress.     Breath sounds: Normal breath sounds.  Abdominal:     General: Bowel sounds are normal. There is no distension.     Palpations: Abdomen is soft.     Tenderness: There is no abdominal tenderness. There is no right CVA tenderness or left CVA tenderness.  Musculoskeletal:      Right hand: Deformity (5th finger with contracture, unable to fully flex or extend) present. No swelling or tenderness. Decreased range of motion (5th finger). Normal strength. Normal sensation. Normal pulse.     Left hand: Normal.     Cervical back: Normal range of motion and neck supple.     Right lower leg: No edema.     Left lower leg: No edema.  Skin:    General: Skin is warm and dry.     Capillary Refill: Capillary refill takes less than 2 seconds.  Neurological:     Mental Status: He is alert and oriented to person, place, and time.     Deep Tendon Reflexes: Reflexes are normal and symmetric.  Psychiatric:        Attention and Perception: Attention normal.        Mood and Affect: Mood normal.        Speech: Speech normal.        Behavior: Behavior normal. Behavior is cooperative.        Thought Content: Thought content normal.    Results for orders placed or performed in visit on 10/11/21  Microscopic Examination   Urine  Result Value Ref Range   WBC, UA None seen 0 - 5 /hpf   RBC, Urine >30 (H) 0 - 2 /hpf   Epithelial Cells (non renal) None seen 0 - 10 /hpf   Mucus, UA Present (A) Not Estab.   Bacteria, UA None seen None seen/Few  Urinalysis, Routine w reflex microscopic  Result Value Ref Range   Specific Gravity, UA >1.030 (H) 1.005 - 1.030   pH, UA 5.5 5.0 - 7.5   Color, UA Red (A) Yellow   Appearance Ur Cloudy (A) Clear   Leukocytes,UA Negative Negative   Protein,UA 2+ (A) Negative/Trace   Glucose, UA Negative Negative   Ketones, UA Negative Negative   RBC, UA 3+ (A) Negative   Bilirubin, UA Negative Negative   Urobilinogen, Ur 1.0 0.2 - 1.0 mg/dL   Nitrite, UA Negative Negative   Microscopic Examination See below:       Assessment & Plan:   Problem List Items Addressed  This Visit       Cardiovascular and Mediastinum   Essential hypertension (Chronic)    Chronic, ongoing with elevation above goal on initial and repeat + elevations occasionally at home.   Continue current medication regimen, but add on Olmesartan 10 MG daily for BP and kidney protection with CKD, and adjust as needed.  Educated him on this medication use and side effects to monitor for.  Recommend checking BP three mornings a week and documenting + focus on DASH diet.  Continue collaboration with cardiology.  CBC and CMP today.  Return in 4 weeks.      Relevant Medications   olmesartan (BENICAR) 5 MG tablet   Other Relevant Orders   Comprehensive metabolic panel   Aortic atherosclerosis ([place])    Ongoing.  Seen on CT scan 11/02/20 -- have recommended statin initiation for prevention and reviewed imaging and ASCVD scoring with him.  He wishes to think about this and repeat lipid panel.      Relevant Medications   olmesartan (BENICAR) 5 MG tablet   Other Relevant Orders   Comprehensive metabolic panel   Lipid Panel w/o Chol/HDL Ratio   Atrial fibrillation with RVR (HCC) - Primary    Chronic, stable with rate controlled.  Continue current medication regimen and collaboration with caridology.  Labs today.      Relevant Medications   olmesartan (BENICAR) 5 MG tablet   Other Relevant Orders   Comprehensive metabolic panel     Respiratory   OSA (obstructive sleep apnea)    Ongoing and stable.  Continue use of CPAP 100% of the time.        Endocrine   Hypothyroidism (Chronic)    Chronic, ongoing.  Continue current medication regimen and adjust as needed. Thyroid labs up to date, monitor closely as is also taking Amiodarone.        Musculoskeletal and Integument   Dupuytren's contracture    Present to left 5th finger, will place referral to ortho for possible injection to area.      Relevant Orders   Ambulatory referral to Orthopedics     Genitourinary   Asymptomatic microscopic hematuria    Refer to history of prostate cancer plan of care.      Relevant Orders   Urinalysis, Routine w reflex microscopic (Completed)   CKD (chronic kidney disease), stage III  (HCC)    Chronic, noted on past labs on and off since 2019.  Monitor closely and recommend adequate hydration at home + avoid NSAIDs.  CMP today.  Refer to nephrology as needed.  Adding on Olmesartan for BP, which may also offer benefit to kidneys.      Relevant Orders   Comprehensive metabolic panel   CBC with Differential/Platelet     Hematopoietic and Hemostatic   Other thrombophilia ([place])    On Eliquis and has A-Fib.  Continue to monitor for any bleeding or wounds.  CBC today.      Relevant Orders   CBC with Differential/Platelet     Other   BMI 29.0-29.9,adult    BMI 29.45.  Recommended eating smaller high protein, low fat meals more frequently and exercising 30 mins a day 5 times a week with a goal of 10-15lb weight loss in the next 3 months. Patient voiced their understanding and motivation to adhere to these recommendations.       History of prostate cancer    History of with seed placement, currently having hematuria issues again.  3+ BLD on UA.  Will check PSA.  Hold Eliquis for 3 days at this time and ensure good hydration.  He will return to urology if ongoing or worsening presents, discussed with him today.      Relevant Orders   PSA   Hyperlipidemia LDL goal <70    Ongoing.  No current statin, discussed at length current levels and ASCVD 34.2%.  He wishes to further discuss with cardiology.  If statin initiated would need to be low dose and assess closely as takes Amiodarone.  Lipid check today.      Relevant Medications   olmesartan (BENICAR) 5 MG tablet   Other Relevant Orders   Comprehensive metabolic panel   Lipid Panel w/o Chol/HDL Ratio     Follow up plan: Return in about 4 weeks (around 11/08/2021) for HTN AND HEMATURIA.

## 2021-10-11 NOTE — Assessment & Plan Note (Signed)
Chronic, noted on past labs on and off since 2019.  Monitor closely and recommend adequate hydration at home + avoid NSAIDs.  CMP today.  Refer to nephrology as needed.  Adding on Olmesartan for BP, which may also offer benefit to kidneys.

## 2021-10-11 NOTE — Assessment & Plan Note (Signed)
Ongoing.  No current statin, discussed at length current levels and ASCVD 34.2%.  He wishes to further discuss with cardiology.  If statin initiated would need to be low dose and assess closely as takes Amiodarone.  Lipid check today.

## 2021-10-11 NOTE — Assessment & Plan Note (Signed)
Chronic, stable with rate controlled.  Continue current medication regimen and collaboration with caridology.  Labs today.

## 2021-10-12 LAB — CBC WITH DIFFERENTIAL/PLATELET
Basophils Absolute: 0.1 10*3/uL (ref 0.0–0.2)
Basos: 1 %
EOS (ABSOLUTE): 0.1 10*3/uL (ref 0.0–0.4)
Eos: 1 %
Hematocrit: 45 % (ref 37.5–51.0)
Hemoglobin: 14.6 g/dL (ref 13.0–17.7)
Immature Grans (Abs): 0 10*3/uL (ref 0.0–0.1)
Immature Granulocytes: 0 %
Lymphocytes Absolute: 2 10*3/uL (ref 0.7–3.1)
Lymphs: 32 %
MCH: 30.2 pg (ref 26.6–33.0)
MCHC: 32.4 g/dL (ref 31.5–35.7)
MCV: 93 fL (ref 79–97)
Monocytes Absolute: 1 10*3/uL — ABNORMAL HIGH (ref 0.1–0.9)
Monocytes: 16 %
Neutrophils Absolute: 3.1 10*3/uL (ref 1.4–7.0)
Neutrophils: 50 %
Platelets: 135 10*3/uL — ABNORMAL LOW (ref 150–450)
RBC: 4.84 x10E6/uL (ref 4.14–5.80)
RDW: 13.7 % (ref 11.6–15.4)
WBC: 6.3 10*3/uL (ref 3.4–10.8)

## 2021-10-12 LAB — LIPID PANEL W/O CHOL/HDL RATIO
Cholesterol, Total: 157 mg/dL (ref 100–199)
HDL: 54 mg/dL (ref >39)
LDL Chol Calc (NIH): 92 mg/dL (ref 0–99)
Triglycerides: 52 mg/dL (ref 0–149)
VLDL Cholesterol Cal: 11 mg/dL (ref 5–40)

## 2021-10-12 LAB — COMPREHENSIVE METABOLIC PANEL
ALT: 28 IU/L (ref 0–44)
AST: 27 IU/L (ref 0–40)
Albumin/Globulin Ratio: 2.4 — ABNORMAL HIGH (ref 1.2–2.2)
Albumin: 4.6 g/dL (ref 3.8–4.8)
Alkaline Phosphatase: 133 IU/L — ABNORMAL HIGH (ref 44–121)
BUN/Creatinine Ratio: 14 (ref 10–24)
BUN: 17 mg/dL (ref 8–27)
Bilirubin Total: 1.4 mg/dL — ABNORMAL HIGH (ref 0.0–1.2)
CO2: 24 mmol/L (ref 20–29)
Calcium: 9.6 mg/dL (ref 8.6–10.2)
Chloride: 105 mmol/L (ref 96–106)
Creatinine, Ser: 1.23 mg/dL (ref 0.76–1.27)
Globulin, Total: 1.9 g/dL (ref 1.5–4.5)
Glucose: 109 mg/dL — ABNORMAL HIGH (ref 70–99)
Potassium: 4.2 mmol/L (ref 3.5–5.2)
Sodium: 144 mmol/L (ref 134–144)
Total Protein: 6.5 g/dL (ref 6.0–8.5)
eGFR: 61 mL/min/{1.73_m2} (ref >59)

## 2021-10-12 LAB — PSA: Prostate Specific Ag, Serum: <0.1 ng/mL (ref 0.0–4.0)

## 2021-10-12 NOTE — Progress Notes (Signed)
Contacted via MyChart   Good afternoon [name], your labs have returned: - Kidney function, creatinine and eGFR, remains normal, as is liver function, AST and ALT.  Alkaline phosphatase is a little elevated, trending up.  Do you still have gall bladder? - CBC shows no anemia or infection.  Platelets still a little on low side, but remain stable. - Cholesterol panel shows stable LDL, but I do still recommend statin for heart health -- definitely discuss with cardiology.  Your ASCVD scoring below is still elevated. - PSA level remains stable.  Any questions? Keep being amazing!!  Thank you for allowing me to participate in your care.  I appreciate you. Kindest regards, Colt Martelle

## 2021-10-15 DIAGNOSIS — D485 Neoplasm of uncertain behavior of skin: Secondary | ICD-10-CM | POA: Diagnosis not present

## 2021-10-15 DIAGNOSIS — L821 Other seborrheic keratosis: Secondary | ICD-10-CM | POA: Diagnosis not present

## 2021-10-15 DIAGNOSIS — X32XXXA Exposure to sunlight, initial encounter: Secondary | ICD-10-CM | POA: Diagnosis not present

## 2021-10-15 DIAGNOSIS — D0439 Carcinoma in situ of skin of other parts of face: Secondary | ICD-10-CM | POA: Diagnosis not present

## 2021-10-15 DIAGNOSIS — D2261 Melanocytic nevi of right upper limb, including shoulder: Secondary | ICD-10-CM | POA: Diagnosis not present

## 2021-10-15 DIAGNOSIS — D2271 Melanocytic nevi of right lower limb, including hip: Secondary | ICD-10-CM | POA: Diagnosis not present

## 2021-10-15 DIAGNOSIS — L570 Actinic keratosis: Secondary | ICD-10-CM | POA: Diagnosis not present

## 2021-10-15 DIAGNOSIS — D2262 Melanocytic nevi of left upper limb, including shoulder: Secondary | ICD-10-CM | POA: Diagnosis not present

## 2021-10-15 DIAGNOSIS — Z85828 Personal history of other malignant neoplasm of skin: Secondary | ICD-10-CM | POA: Diagnosis not present

## 2021-10-22 DIAGNOSIS — M720 Palmar fascial fibromatosis [Dupuytren]: Secondary | ICD-10-CM | POA: Diagnosis not present

## 2021-11-04 NOTE — Patient Instructions (Signed)

## 2021-11-06 ENCOUNTER — Ambulatory Visit (INDEPENDENT_AMBULATORY_CARE_PROVIDER_SITE_OTHER): Payer: PPO | Admitting: *Deleted

## 2021-11-06 DIAGNOSIS — Z0000 Encounter for general adult medical examination without abnormal findings: Secondary | ICD-10-CM | POA: Diagnosis not present

## 2021-11-06 NOTE — Progress Notes (Signed)
Subjective:   [name] [name] is a 76 y.o. male who presents for Medicare Annual/Subsequent preventive examination.  I connected with  [name] [name] on 11/06/21 by a telephone enabled telemedicine application and verified that I am speaking with the correct person using two identifiers.   I discussed the limitations of evaluation and management by telemedicine. The patient expressed understanding and agreed to proceed.  Patient location: home  Provider location: Tele-home-home    Review of Systems     Cardiac Risk Factors include: advanced age (>43mn, >>58women);male gender;hypertension     Objective:    Today's Vitals   There is no height or weight on file to calculate BMI.     11/06/2021   11:03 AM 10/02/2020    9:56 AM 10/01/2019    9:48 AM 09/28/2018    8:16 AM 09/03/2017    8:38 AM 08/28/2016    1:04 PM 05/05/2015    7:08 AM  Advanced Directives  Does Patient Have a Medical Advance Directive? Yes Yes Yes Yes Yes Yes No  Type of AParamedicof A[place] will [place] will Living will;Healthcare Power of Attorney Living will;Healthcare Power of [place]ar[place]Living will   Copy of HPotosiin Chart? Yes - validated most recent copy scanned in chart (See row information) No - copy requested No - copy requested Yes - validated most recent copy scanned in chart (See row information) Yes Yes No - copy requested  Would patient like information on creating a medical advance directive?       No - patient declined information    Current Medications (verified) Outpatient Encounter Medications as of 11/06/2021  Medication Sig   amiodarone (PACERONE) 200 MG tablet Take 1 tablet by mouth once daily   apixaban (ELIQUIS) 5 MG TABS tablet Take 1 tablet by mouth twice daily   Cholecalciferol (VITAMIN D-3) 125 MCG (5000 UT) TABS Take 5,000 Units by mouth  daily.   finasteride (PROSCAR) 5 MG tablet Take 1 tablet (5 mg total) by mouth daily.   levothyroxine (EUTHYROX) 75 MCG tablet Take 1 tablet (75 mcg total) by mouth daily.   metoprolol tartrate (LOPRESSOR) 50 MG tablet Take 1 tablet by mouth twice daily   Multiple Vitamin (MULTIVITAMIN) tablet Take 1 tablet by mouth daily.   olmesartan (BENICAR) 5 MG tablet Take 2 tablets (10 mg total) by mouth daily.   Zinc 50 MG TABS Take 50 mg by mouth daily.   No facility-administered encounter medications on file as of 11/06/2021.    Allergies (verified) Patient has no known allergies.   History: Past Medical History:  Diagnosis Date   A-fib ([facility]    Atrial fibrillation with RVR ([facility]    Dupuytren's contracture    Essential hypertension 09/05/2014   Hypertension    Hypokalemia    Hypothyroidism    Malignant neoplasm of prostate ([facility] 09/05/2014   prostate,skin of the scalp   Mitral regurgitation    a. echo 03/2015: echo 03/2015: EF 50-55%, no RWMA, no sufficienct to allow for LV diastolic fxn, mild MR, LA mildly dilated at 46 mm, RV systolic fxn nl, PASP nl   Nephrolithiasis    New onset atrial fibrillation ([facility] 04/18/2015   OSA (obstructive sleep apnea) 05/11/2015   Persistent atrial fibrillation ([facility]    Past Surgical History:  Procedure Laterality Date   COLONOSCOPY WITH PROPOFOL N/A 04/18/2015   Procedure: COLONOSCOPY WITH PROPOFOL;  Surgeon: Lucilla Lame, MD;  Location: [facility] ENDOSCOPY;  Service: Endoscopy;  Laterality: N/A;   ELECTROPHYSIOLOGIC STUDY N/A 05/05/2015   Procedure: CARDIOVERSION;  Surgeon: Wellington Hampshire, MD;  Location: [facility] ORS;  Service: Cardiovascular;  Laterality: N/A;   RADIOACTIVE SEED IMPLANT N/A    Family History  Problem Relation Age of Onset   Heart attack Mother    Heart disease Father    Arthritis Maternal Grandmother    Social History   Socioeconomic History   Marital status: Married    Spouse name: Not on file   Number of children: Not on file    Years of education: Not on file   Highest education level: Associate degree: academic program  Occupational History   Occupation: retired   Tobacco Use   Smoking status: Former    Types: Cigarettes    Quit date: 09/05/1994    Years since quitting: 27.1   Smokeless tobacco: Former    Types: Chew    Quit date: 09/04/2004  Vaping Use   Vaping Use: Never used  Substance and Sexual Activity   Alcohol use: No   Drug use: No   Sexual activity: Not Currently  Other Topics Concern   Not on file  Social History Narrative   Plays racket ball twice a week, works outside    Investment banker, 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[employer] Strain: [employer]  (11/06/2021)   Overall Financial Resource Strain (CARDIA)    Difficulty of Paying Living Expenses: Not hard at all  Food Insecurity: No [place] (11/06/2021)   Hunger Vital Sign    Worried About Running Out of Food in the Last Year: Never true    [place] in the Last Year: Never true  Transportation Needs: No Transportation Needs (10/02/2020)   PRAPARE - Hydrologist (Medical): No    Lack of Transportation (Non-Medical): No  Physical Activity: Insufficiently Active (11/06/2021)   Exercise Vital Sign    Days of Exercise per Week: 2 days    Minutes of Exercise per Session: 50 min  Stress: No Stress Concern Present (11/06/2021)   [place]    Feeling of Stress : Not at all  Social Connections: [place] (11/06/2021)   Social Connection and Isolation Panel [NHANES]    Frequency of Communication with Friends and Family: Twice a week    Frequency of Social Gatherings with Friends and Family: Once a week    Attends Religious Services: More than 4 times per year    Active Member of [employer] or Organizations: Yes    Attends Music therapist: More than 4 times per year    Marital Status: Married    Tobacco Counseling Counseling  given: Not Answered   Clinical Intake:  Pre-visit preparation completed: Yes  Pain : No/denies pain     Nutritional Risks: None  How often do you need to have someone help you when you read instructions, pamphlets, or other written materials from your doctor or pharmacy?: 1 - Never  Diabetic?  no  Interpreter Needed?: No  Information entered by :: Leroy Kennedy LPN   Activities of Daily Living    11/06/2021   11:07 AM  In your present state of health, do you have any difficulty performing the following activities:  Hearing? 0  Vision? 0  Difficulty concentrating or making decisions? 0  Walking or climbing stairs? 0  Dressing or bathing? 0  Doing errands, shopping? 0  Preparing Food and eating ? N  Using the Toilet? N  In the past six months, have you accidently leaked urine? N  Do you have problems with loss of bowel control? N  Managing your Medications? N  Managing your Finances? N    Patient Care Team: Venita Lick, NP as PCP - General (Nurse Practitioner) Lucilla Lame, MD as Consulting Physician (Gastroenterology) Minna Merritts, MD as Consulting Physician (Cardiology) Dasher, Rayvon Char, MD as Consulting Physician (Dermatology)  Indicate any recent Medical Services you may have received from other than [facility] providers in the past year (date may be approximate).     Assessment:   This is a routine wellness examination for Ronith.  Hearing/Vision screen Hearing Screening - Comments:: No trouble hearing Vision Screening - Comments:: Up to date Joya San  Dietary issues and exercise activities discussed: Current Exercise Habits: Home exercise routine (plays racquet ball 2 x a week), Time (Minutes): 50, Frequency (Times/Week): 2, Weekly Exercise (Minutes/Week): 100, Intensity: Mild   Goals Addressed             This Visit's Progress    Weight (lb) < 200 lb (90.7 kg)       5 pounds       Depression Screen    11/06/2021   11:19 AM 10/11/2021     9:31 AM 04/13/2021    8:52 AM 10/02/2020    9:57 AM 04/13/2020    8:47 AM 10/01/2019    9:49 AM 09/28/2018    8:15 AM  PHQ 2/9 Scores  PHQ - 2 Score 0 0 0 0 0 0 0  PHQ- 9 Score  0 0  0 0 0    Fall Risk    11/06/2021   11:03 AM 10/11/2021    9:31 AM 10/02/2020    9:57 AM 04/13/2020    8:47 AM 10/01/2019    9:49 AM  Fall Risk   Falls in the past year? 0 0 0 0 0  Number falls in past yr: 0 0     Injury with Fall? 0 0     Risk for fall due to :  No Fall Risks Medication side effect  Medication side effect  Follow up Falls evaluation completed;Education provided;Falls prevention discussed Falls evaluation completed Falls evaluation completed;Education provided;Falls prevention discussed  Falls evaluation completed;Education provided;Falls prevention discussed    FALL RISK PREVENTION PERTAINING TO THE HOME:  Any stairs in or around the home? Yes  If so, are there any without handrails? No  Home free of loose throw rugs in walkways, pet beds, electrical cords, etc? Yes  Adequate lighting in your home to reduce risk of falls? Yes   ASSISTIVE DEVICES UTILIZED TO PREVENT FALLS:  Life alert? No  Use of a cane, walker or w/c? No  Grab bars in the bathroom? No  Shower chair or bench in shower? Yes  Elevated toilet seat or a handicapped toilet? No   TIMED UP AND GO:  Was the test performed? No .    Cognitive Function:        11/06/2021   11:05 AM 10/02/2020    9:58 AM 10/01/2019    9:51 AM 10/21/2018    8:24 AM 09/28/2018    8:16 AM  6CIT Screen  What Year? 0 points 0 points 0 points 0 points 0 points  What month? 0 points 0 points 0 points 0 points 0 points  What time? 0 points 0 points 0 points  0 points 0 points  Count back from 20 0 points 0 points 0 points 0 points 0 points  Months in reverse 0 points 0 points 2 points 0 points 0 points  Repeat phrase 0 points 0 points 0 points 0 points 0 points  Total Score 0 points 0 points 2 points 0 points 0 points     Immunizations Immunization History  Administered Date(s) Administered   Fluad Quad(high Dose 65+) 12/04/2018, 12/28/2019, 04/13/2021   Influenza, High Dose Seasonal PF 03/07/2016, 03/26/2017, 03/31/2018   Influenza,inj,Quad PF,6+ Mos 03/07/2015   PFIZER(Purple Top)SARS-COV-2 Vaccination 05/07/2019, 05/28/2019, 12/14/2019   Pneumococcal Conjugate-13 02/28/2014   Pneumococcal Polysaccharide-23 08/02/2009   Td 04/13/2020   Tdap 07/28/2008   Zoster, Live 08/14/2010    TDAP status: Up to date  Flu Vaccine status: Up to date  Pneumococcal vaccine status: Up to date  Covid-19 vaccine status: Information provided on how to obtain vaccines.   Qualifies for Shingles Vaccine? Yes   Zostavax completed Yes   Shingrix Completed?: No.    Education has been provided regarding the importance of this vaccine. Patient has been advised to call insurance company to determine out of pocket expense if they have not yet received this vaccine. Advised may also receive vaccine at local pharmacy or Health Dept. Verbalized acceptance and understanding.  Screening Tests Health Maintenance  Topic Date Due   INFLUENZA VACCINE  10/16/2021   COVID-19 Vaccine (4 - Pfizer risk series) 11/22/2021 (Originally 02/08/2020)   Zoster Vaccines- Shingrix (1 of 2) 01/11/2022 (Originally 02/25/1965)   TETANUS/TDAP  04/13/2030   Hepatitis C Screening  Completed   HPV VACCINES  Aged Out   Pneumonia Vaccine 9+ Years old  Discontinued   COLONOSCOPY (Pts 45-85yr Insurance coverage will need to be confirmed)  Discontinued    Health Maintenance  Health Maintenance Due  Topic Date Due   INFLUENZA VACCINE  10/16/2021    Colorectal cancer screening: No longer required.   Lung Cancer Screening: (Low Dose CT Chest recommended if Age 77-80years, 30 pack-year currently smoking OR have quit w/in 15years.) does not qualify.   Lung Cancer Screening Referral:   Additional Screening:  Hepatitis C Screening: does not  qualify; Completed 2019  Vision Screening: Recommended annual ophthalmology exams for early detection of glaucoma and other disorders of the eye. Is the patient up to date with their annual eye exam?  Yes  Who is the provider or what is the name of the office in which the patient attends annual eye exams? T[facility] SanIf pt is not established with a provider, would they like to be referred to a provider to establish care? No .   Dental Screening: Recommended annual dental exams for proper oral hygiene  Community Resource Referral / Chronic Care Management: CRR required this visit?  No   CCM required this visit?  No      Plan:     I have personally reviewed and noted the following in the patient's chart:   Medical and social history Use of alcohol, tobacco or illicit drugs  Current medications and supplements including opioid prescriptions. Patient is not currently taking opioid prescriptions. Functional ability and status Nutritional status Physical activity Advanced directives List of other physicians Hospitalizations, surgeries, and ER visits in previous 12 months Vitals Screenings to include cognitive, depression, and falls Referrals and appointments  In addition, I have reviewed and discussed with patient certain preventive protocols, quality metrics, and best practice recommendations. A written personalized care plan for preventive services  as well as general preventive health recommendations were provided to patient.     Leroy Kennedy, LPN   11/28/6857   Nurse Notes:

## 2021-11-06 NOTE — Patient Instructions (Signed)
[name] , Thank you for taking time to come for your Medicare Wellness Visit. I appreciate your ongoing commitment to your health goals. Please review the following plan we discussed and let me know if I can assist you in the future.   Screening recommendations/referrals: Colonoscopy: no longer required Recommended yearly ophthalmology/optometry visit for glaucoma screening and checkup Recommended yearly dental visit for hygiene and checkup  Vaccinations: Influenza vaccine: up to date Pneumococcal vaccine: up to date Tdap vaccine: up to date Shingles vaccine: Education provided    Advanced directives: on file    Next appointment: 11-09-2021 @ 10:20  [name] [name] 65 Years and Older, Male Preventive care refers to lifestyle choices and visits with your health care provider that can promote health and wellness. What does preventive care include? A yearly physical exam. This is also called an annual well check. Dental exams once or twice a year. Routine eye exams. Ask your health care provider how often you should have your eyes checked. Personal lifestyle choices, including: Daily care of your teeth and gums. Regular physical activity. Eating a healthy diet. Avoiding tobacco and drug use. Limiting alcohol use. Practicing safe sex. Taking low doses of aspirin every day. Taking vitamin and mineral supplements as recommended by your health care provider. What happens during an annual well check? The services and screenings done by your health care provider during your annual well check will depend on your age, overall health, lifestyle risk factors, and family history of disease. Counseling  Your health care provider may ask you questions about your: Alcohol use. Tobacco use. Drug use. Emotional well-being. Home and relationship well-being. Sexual activity. Eating habits. History of falls. Memory and ability to understand (cognition). Work and work  Statistician. Screening  You may have the following tests or measurements: Height, weight, and BMI. Blood pressure. Lipid and cholesterol levels. These may be checked every 5 years, or more frequently if you are over 23 years old. Skin check. Lung cancer screening. You may have this screening every year starting at age 14 if you have a 30-pack-year history of smoking and currently smoke or have quit within the past 15 years. Fecal occult blood test (FOBT) of the stool. You may have this test every year starting at age 71. Flexible sigmoidoscopy or colonoscopy. You may have a sigmoidoscopy every 5 years or a colonoscopy every 10 years starting at age 44. Prostate cancer screening. Recommendations will vary depending on your family history and other risks. Hepatitis C blood test. Hepatitis B blood test. Sexually transmitted disease (STD) testing. Diabetes screening. This is done by checking your blood sugar (glucose) after you have not eaten for a while (fasting). You may have this done every 1-3 years. Abdominal aortic aneurysm (AAA) screening. You may need this if you are a current or former smoker. Osteoporosis. You may be screened starting at age 29 if you are at high risk. Talk with your health care provider about your test results, treatment options, and if necessary, the need for more tests. Vaccines  Your health care provider may recommend certain vaccines, such as: Influenza vaccine. This is recommended every year. Tetanus, diphtheria, and acellular pertussis (Tdap, Td) vaccine. You may need a Td booster every 10 years. Zoster vaccine. You may need this after age 51. Pneumococcal 13-valent conjugate (PCV13) vaccine. One dose is recommended after age 61. Pneumococcal polysaccharide (PPSV23) vaccine. One dose is recommended after age 27. Talk to your health care provider about which screenings and vaccines you  need and how often you need them. This information is not intended to replace  advice given to you by your health care provider. Make sure you discuss any questions you have with your health care provider. Document Released: 03/31/2015 Document Revised: 11/22/2015 Document Reviewed: 01/03/2015 Elsevier Interactive Patient Education  2017 [place] Prevention in the Home Falls can cause injuries. They can happen to people of all ages. There are many things you can do to make your home safe and to help prevent falls. What can I do on the outside of my home? Regularly fix the edges of walkways and driveways and fix any cracks. Remove anything that might make you trip as you walk through a door, such as a raised step or threshold. Trim any bushes or trees on the path to your home. Use bright outdoor lighting. Clear any walking paths of anything that might make someone trip, such as rocks or tools. Regularly check to see if handrails are loose or broken. Make sure that both sides of any steps have handrails. Any raised decks and porches should have guardrails on the edges. Have any leaves, snow, or ice cleared regularly. Use sand or salt on walking paths during winter. Clean up any spills in your garage right away. This includes oil or grease spills. What can I do in the bathroom? Use night lights. Install grab bars by the toilet and in the tub and shower. Do not use towel bars as grab bars. Use non-skid mats or decals in the tub or shower. If you need to sit down in the shower, use a plastic, non-slip stool. Keep the floor dry. Clean up any water that spills on the floor as soon as it happens. Remove soap buildup in the tub or shower regularly. Attach bath mats securely with double-sided non-slip rug tape. Do not have throw rugs and other things on the floor that can make you trip. What can I do in the bedroom? Use night lights. Make sure that you have a light by your bed that is easy to reach. Do not use any sheets or blankets that are too big for your bed.  They should not hang down onto the floor. Have a firm chair that has side arms. You can use this for support while you get dressed. Do not have throw rugs and other things on the floor that can make you trip. What can I do in the kitchen? Clean up any spills right away. Avoid walking on wet floors. Keep items that you use a lot in easy-to-reach places. If you need to reach something above you, use a strong step stool that has a grab bar. Keep electrical cords out of the way. Do not use floor polish or wax that makes floors slippery. If you must use wax, use non-skid floor wax. Do not have throw rugs and other things on the floor that can make you trip. What can I do with my stairs? Do not leave any items on the stairs. Make sure that there are handrails on both sides of the stairs and use them. Fix handrails that are broken or loose. Make sure that handrails are as long as the stairways. Check any carpeting to make sure that it is firmly attached to the stairs. Fix any carpet that is loose or worn. Avoid having throw rugs at the top or bottom of the stairs. If you do have throw rugs, attach them to the floor with carpet tape. Make sure that  you have a light switch at the top of the stairs and the bottom of the stairs. If you do not have them, ask someone to add them for you. What else can I do to help prevent falls? Wear shoes that: Do not have high heels. Have rubber bottoms. Are comfortable and fit you well. Are closed at the toe. Do not wear sandals. If you use a stepladder: Make sure that it is fully opened. Do not climb a closed stepladder. Make sure that both sides of the stepladder are locked into place. Ask someone to hold it for you, if possible. Clearly mark and make sure that you can see: Any grab bars or handrails. First and last steps. Where the edge of each step is. Use tools that help you move around (mobility aids) if they are needed. These  include: Canes. Walkers. Scooters. Crutches. Turn on the lights when you go into a dark area. Replace any light bulbs as soon as they burn out. Set up your furniture so you have a clear path. Avoid moving your furniture around. If any of your floors are uneven, fix them. If there are any pets around you, be aware of where they are. Review your medicines with your doctor. Some medicines can make you feel dizzy. This can increase your chance of falling. Ask your doctor what other things that you can do to help prevent falls. This information is not intended to replace advice given to you by your health care provider. Make sure you discuss any questions you have with your health care provider. Document Released: 12/29/2008 Document Revised: 08/10/2015 Document Reviewed: 04/08/2014 Elsevier Interactive Patient Education  2017 [employer].

## 2021-11-09 ENCOUNTER — Encounter: Payer: Self-pay | Admitting: Nurse Practitioner

## 2021-11-09 ENCOUNTER — Ambulatory Visit (INDEPENDENT_AMBULATORY_CARE_PROVIDER_SITE_OTHER): Payer: PPO | Admitting: Nurse Practitioner

## 2021-11-09 VITALS — BP 136/84 | HR 59 | Temp 98.0°F | Ht 73.0 in | Wt 225.2 lb

## 2021-11-09 DIAGNOSIS — N1831 Chronic kidney disease, stage 3a: Secondary | ICD-10-CM

## 2021-11-09 DIAGNOSIS — Z8546 Personal history of malignant neoplasm of prostate: Secondary | ICD-10-CM

## 2021-11-09 DIAGNOSIS — I4891 Unspecified atrial fibrillation: Secondary | ICD-10-CM | POA: Diagnosis not present

## 2021-11-09 DIAGNOSIS — R3121 Asymptomatic microscopic hematuria: Secondary | ICD-10-CM

## 2021-11-09 DIAGNOSIS — I10 Essential (primary) hypertension: Secondary | ICD-10-CM

## 2021-11-09 LAB — URINALYSIS, ROUTINE W REFLEX MICROSCOPIC
Bilirubin, UA: NEGATIVE
Glucose, UA: NEGATIVE
Ketones, UA: NEGATIVE
Leukocytes,UA: NEGATIVE
Nitrite, UA: NEGATIVE
Protein,UA: NEGATIVE
RBC, UA: NEGATIVE
Specific Gravity, UA: 1.025 (ref 1.005–1.030)
Urobilinogen, Ur: 1 mg/dL (ref 0.2–1.0)
pH, UA: 6 (ref 5.0–7.5)

## 2021-11-09 MED ORDER — OLMESARTAN MEDOXOMIL 5 MG PO TABS
10.0000 mg | ORAL_TABLET | Freq: Every day | ORAL | 4 refills | Status: DC
Start: 2021-11-09 — End: 2022-02-05

## 2021-11-09 NOTE — Assessment & Plan Note (Addendum)
Chronic, stable with rate controlled.  Continue current medication regimen and collaboration with caridology.

## 2021-11-09 NOTE — Progress Notes (Signed)
BP 136/84 (BP Location: Left Arm)   Pulse (!) 59   Temp 98 F (36.7 C) (Oral)   Ht 6' 1"  (1.854 m)   Wt 225 lb 3.2 oz (102.2 kg)   SpO2 99%   BMI 29.71 kg/m    Subjective:    Patient ID: [name] [name], male    DOB: May 25, 1945, 76 y.o.   MRN: [id number]  HPI: [name] [name] is a 76 y.o. male  Chief Complaint  Patient presents with   Hypertension   Hematuria   HYPERTENSION / HYPERLIPIDEMIA Taking Metoprolol 50 MG BID and Amiodarone 200 MG daily + started low dose Olmesartan last visit for BP regulation and kidney protection.  Is tolerating this well. Followed by Dr. Rockey Situ, last saw 01/31/2021, no changes made at time -- returns yearly.  Discussed statin therapy with patient and current ASCVD.   Satisfied with current treatment? yes Duration of hypertension: chronic BP monitoring frequency: a few times a month BP range: average BP has been 128/82 and HR 59 -- 118/70 to 149/86 BP medication side effects: no Duration of hyperlipidemia: chronic Cholesterol supplements: none Medication compliance: good compliance Aspirin: no Recent stressors: no Recurrent headaches: no Visual changes: no Palpitations: no Dyspnea: no Chest pain: no Lower extremity edema: no Dizzy/lightheaded: no  The 10-year ASCVD risk score (Arnett DK, et al., 2019) is: 30.2%   Values used to calculate the score:     Age: 53 years     Sex: Male     Is Non-Hispanic African American: No     Diabetic: No     Tobacco smoker: No     Systolic Blood Pressure: 242 mmHg     Is BP treated: Yes     HDL Cholesterol: 54 mg/dL     Total Cholesterol: 157 mg/dL  CHRONIC KIDNEY DISEASE Recent labs were improved, waxes and wanes. CKD status: controlled Medications renally dose: yes Previous renal evaluation: yes Pneumovax:  refused Influenza Vaccine:  Up to Date   BPH Takes Finasteride -- history of prostate CA (received implants - seeds).  Last saw Dr. Diamantina Providence 11/09/20.  No further hematuria  recently. BPH status: stable Satisfied with current treatment?: yes Medication side effects: no Medication compliance: good compliance Duration: chronic Nocturia: 2-3x per night Urinary frequency:no Incomplete voiding: no Urgency: no Weak urinary stream: varies Straining to start stream: no Dysuria: no Onset: gradual Severity: mild Alleviating factors: Finasteride Aggravating factors: unknown Treatments attempted: Finasteride IPSS Questionnaire (AUA-7): 5 score today Over the past month.   1)  How often have you had a sensation of not emptying your bladder completely after you finish urinating?  0 - Not at all  2)  How often have you had to urinate again less than two hours after you finished urinating? 1 - Less than 1 time in 5  3)  How often have you found you stopped and started again several times when you urinated?  0 - Not at all  4) How difficult have you found it to postpone urination?  0 - Not at all  5) How often have you had a weak urinary stream?  1 - Less than 1 time in 5  6) How often have you had to push or strain to begin urination?  0 - Not at all  7) How many times did you most typically get up to urinate from the time you went to bed until the time you got up in the morning?  3 - 3 times  Total score:  0-7 mildly symptomatic   8-19 moderately symptomatic   20-35 severely symptomatic   Relevant past medical, surgical, family and social history reviewed and updated as indicated. Interim medical history since our last visit reviewed. Allergies and medications reviewed and updated.  Review of Systems  Constitutional:  Negative for activity change, diaphoresis, fatigue and fever.  Respiratory:  Negative for cough, chest tightness, shortness of breath and wheezing.   Cardiovascular:  Negative for chest pain, palpitations and leg swelling.  Gastrointestinal: Negative.   Endocrine: Negative.   Genitourinary:  Negative for dysuria, frequency, hematuria and urgency.   Neurological: Negative.   Psychiatric/Behavioral: Negative.      Per HPI unless specifically indicated above     Objective:    BP 136/84 (BP Location: Left Arm)   Pulse (!) 59   Temp 98 F (36.7 C) (Oral)   Ht 6' 1"  (1.854 m)   Wt 225 lb 3.2 oz (102.2 kg)   SpO2 99%   BMI 29.71 kg/m   Wt Readings from Last 3 Encounters:  11/09/21 225 lb 3.2 oz (102.2 kg)  10/11/21 223 lb 3.2 oz (101.2 kg)  04/13/21 223 lb 3.2 oz (101.2 kg)    Physical Exam Vitals and nursing note reviewed.  Constitutional:      General: He is awake. He is not in acute distress.    Appearance: He is well-developed, well-groomed and overweight. He is not ill-appearing.  HENT:     Head: Normocephalic and atraumatic.     Right Ear: Hearing normal. No drainage.     Left Ear: Hearing normal. No drainage.  Eyes:     General: Lids are normal.        Right eye: No discharge.        Left eye: No discharge.     Conjunctiva/sclera: Conjunctivae normal.     Pupils: Pupils are equal, round, and reactive to light.  Neck:     Thyroid: No thyromegaly.     Vascular: No carotid bruit.     Trachea: Trachea normal.  Cardiovascular:     Rate and Rhythm: Normal rate and regular rhythm.     Heart sounds: Normal heart sounds, S1 normal and S2 normal. No murmur heard.    No gallop.  Pulmonary:     Effort: Pulmonary effort is normal. No accessory muscle usage or respiratory distress.     Breath sounds: Normal breath sounds.  Abdominal:     General: Bowel sounds are normal. There is no distension.     Palpations: Abdomen is soft.     Tenderness: There is no abdominal tenderness. There is no right CVA tenderness or left CVA tenderness.  Musculoskeletal:     Cervical back: Normal range of motion and neck supple.     Right lower leg: No edema.     Left lower leg: No edema.  Skin:    General: Skin is warm and dry.     Capillary Refill: Capillary refill takes less than 2 seconds.  Neurological:     Mental Status: He is  alert and oriented to person, place, and time.     Deep Tendon Reflexes: Reflexes are normal and symmetric.  Psychiatric:        Attention and Perception: Attention normal.        Mood and Affect: Mood normal.        Speech: Speech normal.        Behavior: Behavior normal. Behavior is cooperative.  Thought Content: Thought content normal.    Results for orders placed or performed in visit on 10/11/21  Microscopic Examination   Urine  Result Value Ref Range   WBC, UA None seen 0 - 5 /hpf   RBC, Urine >30 (H) 0 - 2 /hpf   Epithelial Cells (non renal) None seen 0 - 10 /hpf   Mucus, UA Present (A) Not Estab.   Bacteria, UA None seen None seen/Few  Comprehensive metabolic panel  Result Value Ref Range   Glucose 109 (H) 70 - 99 mg/dL   BUN 17 8 - 27 mg/dL   Creatinine, Ser 1.23 0.76 - 1.27 mg/dL   eGFR 61 >59 mL/min/1.73   BUN/Creatinine Ratio 14 10 - 24   Sodium 144 134 - 144 mmol/L   Potassium 4.2 3.5 - 5.2 mmol/L   Chloride 105 96 - 106 mmol/L   CO2 24 20 - 29 mmol/L   Calcium 9.6 8.6 - 10.2 mg/dL   Total Protein 6.5 6.0 - 8.5 g/dL   Albumin 4.6 3.8 - 4.8 g/dL   Globulin, Total 1.9 1.5 - 4.5 g/dL   Albumin/Globulin Ratio 2.4 (H) 1.2 - 2.2   Bilirubin Total 1.4 (H) 0.0 - 1.2 mg/dL   Alkaline Phosphatase 133 (H) 44 - 121 IU/L   AST 27 0 - 40 IU/L   ALT 28 0 - 44 IU/L  CBC with Differential/Platelet  Result Value Ref Range   WBC 6.3 3.4 - 10.8 x10E3/uL   RBC 4.84 4.14 - 5.80 x10E6/uL   Hemoglobin 14.6 13.0 - 17.7 g/dL   Hematocrit 45.0 37.5 - 51.0 %   MCV 93 79 - 97 fL   MCH 30.2 26.6 - 33.0 pg   MCHC 32.4 31.5 - 35.7 g/dL   RDW 13.7 11.6 - 15.4 %   Platelets 135 (L) 150 - 450 x10E3/uL   Neutrophils 50 Not Estab. %   Lymphs 32 Not Estab. %   Monocytes 16 Not Estab. %   Eos 1 Not Estab. %   Basos 1 Not Estab. %   Neutrophils Absolute 3.1 1.4 - 7.0 x10E3/uL   Lymphocytes Absolute 2.0 0.7 - 3.1 x10E3/uL   Monocytes Absolute 1.0 (H) 0.1 - 0.9 x10E3/uL   EOS  (ABSOLUTE) 0.1 0.0 - 0.4 x10E3/uL   Basophils Absolute 0.1 0.0 - 0.2 x10E3/uL   Immature Granulocytes 0 Not Estab. %   Immature Grans (Abs) 0.0 0.0 - 0.1 x10E3/uL  Lipid Panel w/o Chol/HDL Ratio  Result Value Ref Range   Cholesterol, Total 157 100 - 199 mg/dL   Triglycerides 52 0 - 149 mg/dL   HDL 54 >39 mg/dL   VLDL Cholesterol Cal 11 5 - 40 mg/dL   LDL Chol Calc (NIH) 92 0 - 99 mg/dL  Urinalysis, Routine w reflex microscopic  Result Value Ref Range   Specific Gravity, UA >1.030 (H) 1.005 - 1.030   pH, UA 5.5 5.0 - 7.5   Color, UA Red (A) Yellow   Appearance Ur Cloudy (A) Clear   Leukocytes,UA Negative Negative   Protein,UA 2+ (A) Negative/Trace   Glucose, UA Negative Negative   Ketones, UA Negative Negative   RBC, UA 3+ (A) Negative   Bilirubin, UA Negative Negative   Urobilinogen, Ur 1.0 0.2 - 1.0 mg/dL   Nitrite, UA Negative Negative   Microscopic Examination See below:   PSA  Result Value Ref Range   Prostate Specific Ag, Serum <0.1 0.0 - 4.0 ng/mL      Assessment &  Plan:   Problem List Items Addressed This Visit       Cardiovascular and Mediastinum   Essential hypertension (Chronic)    Chronic, ongoing with decrease in elevations at home and in office with Olmesartan.  Continue current medication regimen, including Olmesartan 10 MG daily for BP and kidney protection with CKD, and adjust as needed.  Educated him on this medication use and side effects to monitor for.  Recommend checking BP three mornings a week and documenting + focus on DASH diet.  Continue collaboration with cardiology.  CBC and BMP today.  Return in 5 months.      Relevant Medications   olmesartan (BENICAR) 5 MG tablet   Other Relevant Orders   CBC with Differential/Platelet   Basic metabolic panel   Atrial fibrillation with RVR (HCC)    Chronic, stable with rate controlled.  Continue current medication regimen and collaboration with caridology.       Relevant Medications   olmesartan  (BENICAR) 5 MG tablet     Genitourinary   Asymptomatic microscopic hematuria    Refer to history of prostate cancer plan of care.      Relevant Orders   Urinalysis, Routine w reflex microscopic   CBC with Differential/Platelet   CKD (chronic kidney disease), stage III ([place]) - Primary    Chronic, noted on past labs on and off since 2019 -- recent improved.  Monitor closely and recommend adequate hydration at home + avoid NSAIDs.  BMP today.  Refer to nephrology as needed.  Continue Olmesartan for BP, which may also offer benefit to kidneys.        Other   History of prostate cancer    History of with seed placement, currently no blood in urine (improved). He will return to urology if ongoing or worsening presents, discussed with him today.      Relevant Orders   CBC with Differential/Platelet     Follow up plan: Return in about 5 months (around 04/11/2022) for Annual physical after 04/13/2022.

## 2021-11-09 NOTE — Assessment & Plan Note (Signed)
Refer to history of prostate cancer plan of care.

## 2021-11-09 NOTE — Assessment & Plan Note (Signed)
History of with seed placement, currently no blood in urine (improved). He will return to urology if ongoing or worsening presents, discussed with him today.

## 2021-11-09 NOTE — Assessment & Plan Note (Signed)
Chronic, ongoing with decrease in elevations at home and in office with Olmesartan.  Continue current medication regimen, including Olmesartan 10 MG daily for BP and kidney protection with CKD, and adjust as needed.  Educated him on this medication use and side effects to monitor for.  Recommend checking BP three mornings a week and documenting + focus on DASH diet.  Continue collaboration with cardiology.  CBC and BMP today.  Return in 5 months.

## 2021-11-09 NOTE — Assessment & Plan Note (Signed)
Chronic, noted on past labs on and off since 2019 -- recent improved.  Monitor closely and recommend adequate hydration at home + avoid NSAIDs.  BMP today.  Refer to nephrology as needed.  Continue Olmesartan for BP, which may also offer benefit to kidneys.

## 2021-11-10 LAB — CBC WITH DIFFERENTIAL/PLATELET
Basophils Absolute: 0.1 10*3/uL (ref 0.0–0.2)
Basos: 1 %
EOS (ABSOLUTE): 0.1 10*3/uL (ref 0.0–0.4)
Eos: 1 %
Hematocrit: 43.1 % (ref 37.5–51.0)
Hemoglobin: 14 g/dL (ref 13.0–17.7)
Immature Grans (Abs): 0 10*3/uL (ref 0.0–0.1)
Immature Granulocytes: 0 %
Lymphocytes Absolute: 1.9 10*3/uL (ref 0.7–3.1)
Lymphs: 32 %
MCH: 30.6 pg (ref 26.6–33.0)
MCHC: 32.5 g/dL (ref 31.5–35.7)
MCV: 94 fL (ref 79–97)
Monocytes Absolute: 1.2 10*3/uL — ABNORMAL HIGH (ref 0.1–0.9)
Monocytes: 20 %
Neutrophils Absolute: 2.7 10*3/uL (ref 1.4–7.0)
Neutrophils: 46 %
Platelets: 120 10*3/uL — ABNORMAL LOW (ref 150–450)
RBC: 4.57 x10E6/uL (ref 4.14–5.80)
RDW: 13.8 % (ref 11.6–15.4)
WBC: 5.9 10*3/uL (ref 3.4–10.8)

## 2021-11-10 LAB — BASIC METABOLIC PANEL
BUN/Creatinine Ratio: 17 (ref 10–24)
BUN: 21 mg/dL (ref 8–27)
CO2: 24 mmol/L (ref 20–29)
Calcium: 9.2 mg/dL (ref 8.6–10.2)
Chloride: 105 mmol/L (ref 96–106)
Creatinine, Ser: 1.24 mg/dL (ref 0.76–1.27)
Glucose: 98 mg/dL (ref 70–99)
Potassium: 4.1 mmol/L (ref 3.5–5.2)
Sodium: 142 mmol/L (ref 134–144)
eGFR: 60 mL/min/{1.73_m2} (ref >59)

## 2021-11-10 NOTE — Progress Notes (Signed)
Contacted via [place] morning [name] [name], your labs have returned: - CBC shows no anemia or infection.  Platelet count remains on lower side but stable with no worsening -- we will continue to monitor this. - Kidney function, creatinine and eGFR, remains normal.  Any questions? Keep being awesome!!  Thank you for allowing me to participate in your care.  I appreciate you. Kindest regards, Ajah Vanhoose

## 2021-12-04 DIAGNOSIS — D0439 Carcinoma in situ of skin of other parts of face: Secondary | ICD-10-CM | POA: Diagnosis not present

## 2021-12-21 ENCOUNTER — Ambulatory Visit: Payer: Self-pay

## 2021-12-21 NOTE — Telephone Encounter (Signed)
  Chief Complaint: COVID fu Symptoms: sneezing, cough, runny nose Frequency: 2 weeks  Pertinent Negatives: Patient denies SOB or fever Disposition: '[]'$ ED /'[]'$ Urgent Care (no appt availability in office) / '[]'$ Appointment(In office/virtual)/ '[]'$  [place] [place] [facility]/ '[x]'$ Home Care/ '[]'$ Refused Recommended Disposition /'[]'$ [place] [facility]/ '[]'$  Follow-up with PCP Additional Notes: pt states his sx began on 12/09/21 and tested positive on 12/11/21. Pt had went out of town with a friend and then he ended up testing positive which made pt want to test. He has been testing frequently and coming back positive but states he feels fine just besides head cold. Advised pt he is out of quarantine timeframe and needs to mask if in public. Only informed him to not test for approx 2 weeks and if still having sx and testing positive to call back. Pt verbalized understanding.   Summary: Covid+   Patient states that he has been testing positive for covid for 13 days. Patient states that he has a little nasal congestion and coughing. Patient wants to know if he is still contagious. Please advise.      Reason for Disposition  [1] PERSISTING SYMPTOMS OF COVID-19 AND [2] symptoms BETTER (improving)  Answer Assessment - Initial Assessment Questions 1. COVID-19 ONSET: "When did the symptoms of COVID-19 first start?"     Last weekend in Sept.  2. DIAGNOSIS CONFIRMATION: "How were you diagnosed?" (e.g., COVID-19 oral or nasal viral test; COVID-19 antibody test; doctor visit)     Home test 3. MAIN SYMPTOM:  "What is your main concern or symptom right now?" (e.g., breathing difficulty, cough, fatigue. loss of smell)     Head cold sx 4. SYMPTOM ONSET: "When did the  sx  start?"     Sept 26th 5. BETTER-SAME-WORSE: "Are you getting better, staying the same, or getting worse over the last 1 to 2 weeks?"     better 7. COUGH: "Do you have a cough?" If Yes, ask: "How bad is the cough?"       Yes just slightly 8. FEVER: "Do you  have a fever?" If Yes, ask: "What is your temperature, how was it measured, and when did it start?"     no 9. BREATHING DIFFICULTY: "Are you having any trouble breathing?" If Yes, ask: "How bad is your breathing?" (e.g., mild, moderate, severe)    - MILD: No SOB at rest, mild SOB with walking, speaks normally in sentences, can lie down, no retractions, pulse < 100.    - MODERATE: SOB at rest, SOB with minimal exertion and prefers to sit, cannot lie down flat, speaks in phrases, mild retractions, audible wheezing, pulse 100-120.    - SEVERE: Very SOB at rest, speaks in single words, struggling to breathe, sitting hunched forward, retractions, pulse > 120.       no 10. OTHER SYMPTOMS: "Do you have any other symptoms?"  (e.g., fatigue, headache, muscle pain, weakness)       Sneezing cough, runny nose 12. VACCINE: "Have you gotten the COVID-19 vaccine?" If Yes, ask: "Which one, how many shots, when did you get it?"       yes  Protocols used: Coronavirus (COVID-19) Persisting Symptoms Follow-up Call-A-[facility]

## 2022-01-23 ENCOUNTER — Other Ambulatory Visit: Payer: Self-pay | Admitting: Cardiovascular Disease

## 2022-02-04 NOTE — Progress Notes (Unsigned)
Date:  02/05/2022   ID:  [name] [name], DOB [birth year], MRN [id number]  Patient Location:  [place] [address]   Provider location:   [facility], [place] office  PCP:  Venita Lick, NP  Cardiologist:  Patsy Baltimore  Chief Complaint  Patient presents with   12 month follow up     "Doing well." Medications reviewed by the patient verbally.     History of Present Illness:    [name] [name] is a 76 y.o. male  past medical history of Afib with RVR with heart rates in the 180's in the setting of hypokalemia and colonoscopy prep,  previous cardioversion   started on Eliquis given his CHADS2VASc of 2 (HTN and age x 1).  Prior hematuria HTN,  remote smoking history when he was in his 34s and 11s, hypothyroidism,  prostate cancer, CT scan ABD showing no CAD, no aorta disease or iliac vessel  Sleep apnea, wears his CPAP, avg about 3 hours then gets nasal congestion He presents for routine follow-up of his paroxysmal atrial fibrillation  Last seen in clinic November 2022 No arrhythmia sx, On amio and metoprolol  Active goes hunting,  Plays racquetball 2x a week  Labs reviewed Total chol 157, LDL 92  CT scan pulled up , mild aortic athero in branches Prefers not to be on a statin  BP at home 465 systolic  Denies significant shortness of breath or chest pain on exertion  Hx of hematuria,heavy in 2021, seen by urology  EKG personally reviewed by myself on todays visit NSR rate 78 bpm, PAC no significant ST or T wave changes   Lab Results  Component Value Date   CHOL 157 10/11/2021   HDL 54 10/11/2021   LDLCALC 92 10/11/2021   TRIG 52 10/11/2021   Other past medical hx review  Previous echocardiogram ECHO EF 50 to 55%, 04/18/2015   clinic visit 05/30/16, atrial fibrillation for several days  does not feel as well when playing racquetball     previously on benazepril, stopped for low blood pressure    He did have episode of hematuria May 2017 Had a Cystoscopy. No significant pathology per the patient No more hematuria since then     bowel prep for a routine screening colonoscopy which he was at [facility] as an outpatient for on 04/18/15.  he was noted to be in new onset Afib with RVR with HR in the 180's  He was completely asymptomatic.     Past Medical History:  Diagnosis Date   A-fib [facility])    Atrial fibrillation with RVR ([place])    Dupuytren's contracture    Essential hypertension 09/05/2014   Hypertension    Hypokalemia    Hypothyroidism    Malignant neoplasm of prostate ([place]) 09/05/2014   prostate,skin of the scalp   Mitral regurgitation    a. echo 03/2015: echo 03/2015: EF 50-55%, no RWMA, no sufficienct to allow for LV diastolic fxn, mild MR, LA mildly dilated at 46 mm, RV systolic fxn nl, PASP nl   Nephrolithiasis    New onset atrial fibrillation ([place]) 04/18/2015   OSA (obstructive sleep apnea) 05/11/2015   Persistent atrial fibrillation (HCC)    Past Surgical History:  Procedure Laterality Date   COLONOSCOPY WITH PROPOFOL N/A 04/18/2015   Procedure: COLONOSCOPY WITH PROPOFOL;  Surgeon: Lucilla Lame, MD;  Location: [facility] ENDOSCOPY;  Service: Endoscopy;  Laterality: N/A;   ELECTROPHYSIOLOGIC STUDY N/A 05/05/2015  Procedure: CARDIOVERSION;  Surgeon: Wellington Hampshire, MD;  Location: [facility] ORS;  Service: Cardiovascular;  Laterality: N/A;   RADIOACTIVE SEED IMPLANT N/A      Current Meds  Medication Sig   amiodarone (PACERONE) 200 MG tablet Take 1 tablet by mouth once daily   apixaban (ELIQUIS) 5 MG TABS tablet Take 1 tablet by mouth twice daily   Cholecalciferol (VITAMIN D-3) 125 MCG (5000 UT) TABS Take 5,000 Units by mouth daily.   finasteride (PROSCAR) 5 MG tablet Take 1 tablet (5 mg total) by mouth daily.   levothyroxine (EUTHYROX) 75 MCG tablet Take 1 tablet (75 mcg total) by mouth daily.   metoprolol tartrate (LOPRESSOR) 50 MG tablet Take 1 tablet by mouth twice daily    Multiple Vitamin (MULTIVITAMIN) tablet Take 1 tablet by mouth daily.   olmesartan (BENICAR) 5 MG tablet Take 2 tablets (10 mg total) by mouth daily.   Zinc 50 MG TABS Take 50 mg by mouth daily.     Allergies:   Patient has no known allergies.   Social History   Tobacco Use   Smoking status: Former    Types: Cigarettes    Quit date: 09/05/1994    Years since quitting: 27.4   Smokeless tobacco: Former    Types: Chew    Quit date: 09/04/2004  Vaping Use   Vaping Use: Never used  Substance Use Topics   Alcohol use: No   Drug use: No     Family Hx: The patient's family history includes Arthritis in his maternal grandmother; Heart attack in his mother; Heart disease in his father.  ROS:   Please see the history of present illness.    Review of Systems  Constitutional: Negative.   HENT: Negative.    Respiratory: Negative.    Cardiovascular: Negative.   Gastrointestinal: Negative.   Musculoskeletal: Negative.   Neurological: Negative.   Psychiatric/Behavioral: Negative.    All other systems reviewed and are negative.    Labs/Other Tests and Data Reviewed:    Recent Labs: 04/13/2021: TSH 3.300 10/11/2021: ALT 28 11/09/2021: BUN 21; Creatinine, Ser 1.24; Hemoglobin 14.0; Platelets 120; Potassium 4.1; Sodium 142   Recent Lipid Panel Lab Results  Component Value Date/Time   CHOL 157 10/11/2021 10:04 AM   TRIG 52 10/11/2021 10:04 AM   HDL 54 10/11/2021 10:04 AM   CHOLHDL 2.7 09/24/2017 10:24 AM   LDLCALC 92 10/11/2021 10:04 AM    Wt Readings from Last 3 Encounters:  02/05/22 227 lb 4 oz (103.1 kg)  11/09/21 225 lb 3.2 oz (102.2 kg)  10/11/21 223 lb 3.2 oz (101.2 kg)     Exam:    BP (!) 162/96 (BP Location: Left Arm, Patient Position: Sitting, Cuff Size: Normal)   Pulse 78   Ht '6\' 1"'$  (1.854 m)   Wt 227 lb 4 oz (103.1 kg)   SpO2 96%   BMI 29.98 kg/m  Constitutional:  oriented to person, place, and time. No distress.  HENT:  Head: Grossly normal Eyes:  no  discharge. No scleral icterus.  Neck: No JVD, no carotid bruits  Cardiovascular: Regular rate and rhythm, no murmurs appreciated Pulmonary/Chest: Clear to auscultation bilaterally, no wheezes or rails Abdominal: Soft.  no distension.  no tenderness.  Musculoskeletal: Normal range of motion Neurological:  normal muscle tone. Coordination normal. No atrophy Skin: Skin warm and dry Psychiatric: normal affect, pleasant  ASSESSMENT & PLAN:    Persistent atrial fibrillation Recommend he continue amiodarone, metoprolol, Eliquis We will continue to follow  his amiodarone surveillance lab work, may need updated TSH soon Maintaining normal sinus rhythm  Essential hypertension Recommend to increase olmesartan up to 20 mg daily given high blood pressures at home.  Suggest he call us with new numbers in the next several weeks  OSA (obstructive sleep apnea) Wears CPAP on a regular basis  Encounter for anticoagulation discussion and monitoring On Eliquis, CBC stable    Total encounter time more than 30 minutes  Greater than 50% was spent in counseling and coordination of care with the patient   Signed, Ida Rogue, MD  02/05/2022 10:15 AM    [place] Office [address], [place], [place] [address]

## 2022-02-05 ENCOUNTER — Encounter: Payer: Self-pay | Admitting: Cardiovascular Disease

## 2022-02-05 ENCOUNTER — Ambulatory Visit: Payer: PPO | Attending: Cardiovascular Disease | Admitting: Cardiovascular Disease

## 2022-02-05 VITALS — BP 162/96 | HR 78 | Ht 73.0 in | Wt 227.2 lb

## 2022-02-05 DIAGNOSIS — I700 Atherosclerosis of aorta: Secondary | ICD-10-CM | POA: Diagnosis not present

## 2022-02-05 DIAGNOSIS — E785 Hyperlipidemia, unspecified: Secondary | ICD-10-CM | POA: Diagnosis not present

## 2022-02-05 DIAGNOSIS — N1831 Chronic kidney disease, stage 3a: Secondary | ICD-10-CM

## 2022-02-05 DIAGNOSIS — I4819 Other persistent atrial fibrillation: Secondary | ICD-10-CM | POA: Diagnosis not present

## 2022-02-05 DIAGNOSIS — G4733 Obstructive sleep apnea (adult) (pediatric): Secondary | ICD-10-CM | POA: Diagnosis not present

## 2022-02-05 DIAGNOSIS — I10 Essential (primary) hypertension: Secondary | ICD-10-CM

## 2022-02-05 MED ORDER — OLMESARTAN MEDOXOMIL 20 MG PO TABS: 20.0000 mg | ORAL_TABLET | Freq: Every day | ORAL | 3 refills | Status: DC

## 2022-02-05 NOTE — Patient Instructions (Addendum)
Medication Instructions:  Please increase the olmesartan up to 20 mg daily  If you need a refill on your cardiac medications before your next appointment, please call your pharmacy.   Lab work: No new labs needed  Testing/Procedures: No new testing needed  Follow-Up: At [facility], you and your health needs are our priority.  As part of our continuing mission to provide you with exceptional heart care, we have created designated Provider Care Teams.  These Care Teams include your primary Cardiologist (physician) and Advanced Practice Providers (APPs -  Physician Assistants and Nurse Practitioners) who all work together to provide you with the care you need, when you need it.  You will need a follow up appointment in 12 months  Providers on your designated Care Team:   Murray Hodgkins, NP Christell Faith, PA-C Cadence Kathlen Mody, [place]  COVID-19 Vaccine Information can be found at: [email or web address] For questions related to vaccine distribution or appointments, please email [email or web address] or call [phone].

## 2022-02-13 ENCOUNTER — Other Ambulatory Visit: Payer: Self-pay | Admitting: Cardiovascular Disease

## 2022-02-13 DIAGNOSIS — I4891 Unspecified atrial fibrillation: Secondary | ICD-10-CM

## 2022-02-13 NOTE — Telephone Encounter (Signed)
Prescription refill request for Eliquis received. Indication: AF Last office visit: 02/05/22  Johnny Bridge MD Scr: 1.24 on 11/09/21 Age: 76 Weight: 103.1kg  Based on above findings Eliquis '5mg'$  twice daily is the appropriate dose.  Refill approved.

## 2022-04-07 NOTE — Patient Instructions (Incomplete)
Food Basics for Chronic Kidney Disease Chronic kidney disease (CKD) is when your kidneys are not working well. They cannot remove waste, fluids, and other substances from your blood the way they should. These substances can build up, which can worsen kidney damage and affect how your body works. Eating certain foods can lead to a buildup of these substances. Changing your diet can help prevent more kidney damage. Diet changes may also delay dialysis or even keep you from needing it. What nutrients should I limit? Work with your treatment team and a food expert (dietitian) to make a meal plan that's right for you. Foods you can eat and foods you should limit or avoid will depend on the stage of your kidney disease and any other health conditions you have. The items listed below are not a complete list. Talk with your dietitian to learn what is best for you. Potassium Potassium affects how steadily your heart beats. Too much potassium in your blood can cause an irregular heartbeat or even a heart attack. You may need to limit foods that are high in potassium, such as: Liquid milk and soy milk. Salt substitutes that contain potassium. Fruits like bananas, apricots, nectarines, melon, prunes, raisins, kiwi, and oranges. Vegetables, such as potatoes, sweet potatoes, yams, tomatoes, leafy greens, beets, avocado, pumpkin, and winter squash. Beans, like lima beans. Nuts. Phosphorus Phosphorus is a mineral found in your bones. You need a balance between calcium and phosphorus to build and maintain healthy bones. Too much added phosphorus from the foods you eat can pull calcium from your bones. Losing calcium can make your bones weak and more likely to break. Too much phosphorus can also make your skin itch. You may need to limit foods that are high in phosphorus or that have added phosphorus, such as: Liquid milk and dairy products. Dark-colored sodas or soft drinks. Bran cereals and  oatmeal. Protein  Protein helps you make and keep muscle. Protein also helps to repair your body's cells and tissues. One of the natural breakdown products of protein is a waste product called urea. When your kidneys are not working well, they cannot remove types of waste like urea. Reducing protein in your diet can help keep urea from building up in your blood. Depending on your stage of kidney disease, you may need to eat smaller portions of foods that are high in protein. Sources of animal protein include: Meat (all types). Fish and seafood. Poultry. Eggs. Dairy. Other protein foods include: Beans and legumes. Nuts and nut butter. Soy, like tofu.  Sodium Salt (sodium) helps to keep a healthy balance of fluids in your body. Too much salt can increase your blood pressure, which can harm your heart and lungs. Extra salt can also cause your body to keep too much fluid, making your kidneys work harder. You may need to limit or avoid foods that are high in salt, such as: Salt seasonings. Soy and teriyaki sauce. Packaged, precooked, cured, or processed meats, such as sausages or meat loaves. Sardines. Salted crackers and snack foods. Fast food. Canned soups and most canned foods. Pickled foods. Vegetable juice. Boxed mixes or ready-to-eat boxed meals and side dishes. Bottled dressings, sauces, and marinades. Talk with your dietitian about how much potassium, phosphorus, protein, and salt you may have each day. Helpful tips Read food labels  Check the amount of salt in foods. Limit foods that have salt or sodium listed among the first five ingredients. Try to eat low-salt foods. Check the ingredient list   for added phosphorus or potassium. "Phos" in an ingredient is a sign that phosphorus has been added. Do not buy foods that are calcium-enriched or that have calcium added to them (are fortified). Buy canned vegetables and beans that say "no salt added" and rinse them before  eating. Lifestyle Limit the amount of protein you eat from animal sources each day. Focus on protein from plant sources, like tofu and dried beans, peas, and lentils. Do not add salt to food when cooking or before eating. Do not eat star fruit. It can be toxic for people with kidney problems. Talk with your health care provider before taking any vitamin or mineral supplements. If told by your health care provider, track how much liquid you drink so you can avoid drinking too much. You may need to include foods you eat that are made mostly from water, like gelatin, ice cream, soups, and juicy fruits and vegetables. If you have diabetes: If you have diabetes (diabetes mellitus) and CKD, you need to keep your blood sugar (glucose) in the target range recommended by your health care provider. Follow your diabetes management plan. This may include: Checking your blood glucose regularly. Taking medicines by mouth, or taking insulin, or both. Exercising for at least 30 minutes on 5 or more days each week, or as told by your health care provider. Tracking how many servings of carbohydrates you eat at each meal. Not using orange juice to treat low blood sugars. Instead, use apple juice, cranberry juice, or clear soda. You may be given guidelines on what foods and nutrients you may eat, and how much you can have each day. This depends on your stage of kidney disease and whether you have high blood pressure (hypertension). Follow the meal plan your dietitian gives you. To learn more: National Institute of Diabetes and Digestive and Kidney Diseases: niddk.nih.gov National Kidney Foundation: kidney.org Summary Chronic kidney disease (CKD) is when your kidneys are not working well. They cannot remove waste, fluids, and other substances from your blood the way they should. These substances can build up, which can worsen kidney damage and affect how your body works. Changing your diet can help prevent more  kidney damage. Diet changes may also delay dialysis or even keep you from needing it. Diet changes are different for each person with CKD. Work with a dietitian to set up a meal plan that is right for you. This information is not intended to replace advice given to you by your health care provider. Make sure you discuss any questions you have with your health care provider. Document Revised: 06/22/2021 Document Reviewed: 06/28/2019 Elsevier Patient Education  2023 [employer]  

## 2022-04-11 ENCOUNTER — Encounter: Payer: PPO | Admitting: Nurse Practitioner

## 2022-04-13 NOTE — Patient Instructions (Signed)

## 2022-04-16 ENCOUNTER — Ambulatory Visit (INDEPENDENT_AMBULATORY_CARE_PROVIDER_SITE_OTHER): Payer: PPO | Admitting: Nurse Practitioner

## 2022-04-16 ENCOUNTER — Encounter: Payer: Self-pay | Admitting: Nurse Practitioner

## 2022-04-16 VITALS — BP 140/70 | HR 60 | Temp 97.6°F | Ht 72.99 in | Wt 234.2 lb

## 2022-04-16 DIAGNOSIS — D6869 Other thrombophilia: Secondary | ICD-10-CM

## 2022-04-16 DIAGNOSIS — I700 Atherosclerosis of aorta: Secondary | ICD-10-CM

## 2022-04-16 DIAGNOSIS — D696 Thrombocytopenia, unspecified: Secondary | ICD-10-CM | POA: Diagnosis not present

## 2022-04-16 DIAGNOSIS — I4891 Unspecified atrial fibrillation: Secondary | ICD-10-CM | POA: Diagnosis not present

## 2022-04-16 DIAGNOSIS — Z23 Encounter for immunization: Secondary | ICD-10-CM

## 2022-04-16 DIAGNOSIS — I10 Essential (primary) hypertension: Secondary | ICD-10-CM | POA: Diagnosis not present

## 2022-04-16 DIAGNOSIS — Z8546 Personal history of malignant neoplasm of prostate: Secondary | ICD-10-CM

## 2022-04-16 DIAGNOSIS — Z0000 Encounter for general adult medical examination without abnormal findings: Secondary | ICD-10-CM

## 2022-04-16 DIAGNOSIS — G4733 Obstructive sleep apnea (adult) (pediatric): Secondary | ICD-10-CM | POA: Diagnosis not present

## 2022-04-16 DIAGNOSIS — R351 Nocturia: Secondary | ICD-10-CM

## 2022-04-16 DIAGNOSIS — N1831 Chronic kidney disease, stage 3a: Secondary | ICD-10-CM

## 2022-04-16 DIAGNOSIS — N401 Enlarged prostate with lower urinary tract symptoms: Secondary | ICD-10-CM

## 2022-04-16 DIAGNOSIS — Z6829 Body mass index (BMI) 29.0-29.9, adult: Secondary | ICD-10-CM

## 2022-04-16 DIAGNOSIS — E785 Hyperlipidemia, unspecified: Secondary | ICD-10-CM | POA: Diagnosis not present

## 2022-04-16 DIAGNOSIS — E039 Hypothyroidism, unspecified: Secondary | ICD-10-CM | POA: Diagnosis not present

## 2022-04-16 LAB — MICROALBUMIN, URINE WAIVED
Creatinine, Urine Waived: 10 mg/dL (ref 10–300)
Microalb, Ur Waived: 10 mg/L (ref 0–19)

## 2022-04-16 NOTE — Assessment & Plan Note (Signed)
Chronic, stable with rate controlled. Continue current medication regimen and collaboration with cardiology.

## 2022-04-16 NOTE — Assessment & Plan Note (Signed)
On eliquis for atrial fibrillation. Continue to monitor for bleeding/wounds. CBC today.

## 2022-04-16 NOTE — Assessment & Plan Note (Signed)
Ongoing. Have discussed statin initiation for prevention and reviewed ASCVD scoring for him and he continues to desire to think about this and repeat lipid panel.

## 2022-04-16 NOTE — Assessment & Plan Note (Signed)
Chronic, ongoing with decrease in elevations at home. Pt brings in BP reading list with average of 30 readings being 137/82. Continue current medication regimen and adjust as needed. Educated on medication use and side effects to monitor. Educated on BP goal of less than 140/90. Recommend checking BP 3-4 times weekly and recording + focus on DASH diet. Continue collaboration with cardiology. CBC and CMP today. Return in 6 months

## 2022-04-16 NOTE — Assessment & Plan Note (Signed)
Ongoing. No current statin use and he prefers to continue to think about this and discuss with cardiology. Lipid check today.

## 2022-04-16 NOTE — Assessment & Plan Note (Signed)
History prostate cancer with seed placement, denies hematuria. Return to urology if ongoing or worsening symptoms present.

## 2022-04-16 NOTE — Assessment & Plan Note (Signed)
Will check CBC today. Monitor closely and send to hematology as needed.

## 2022-04-16 NOTE — Assessment & Plan Note (Signed)
Chronic, noted on labs on and off since 2019. Will get BMP today and monitor closely. Recommend adequate hydration at home, avoid NSAIDs. Refer to nephrology as needed. Continue current medication regimen for kidney protection.

## 2022-04-16 NOTE — Assessment & Plan Note (Signed)
Chronic, ongoing. Continue current medication regimen and adjust as needed. Will get thyroid labs today.

## 2022-04-16 NOTE — Progress Notes (Signed)
BP (!) 140/70 (BP Location: Left Arm)   Pulse 60   Temp 97.6 F (36.4 C) (Oral)   Ht 6' 0.99" (1.854 m)   Wt 234 lb 3.2 oz (106.2 kg)   SpO2 99%   BMI 30.91 kg/m    Subjective:    Patient ID: [name] [name], male    DOB: 1945/10/07, 77 y.o.   MRN: [id number]  NOTE WRITTEN BY DNP STUDENT.  ASSESSMENT AND PLAN OF CARE REVIEWED WITH STUDENT, AGREE WITH ABOVE FINDINGS AND PLAN.   HPI: [name] [name] is a 77 y.o. male presenting on 04/16/2022 for comprehensive medical examination. Current medical complaints include:none  He currently lives with: wife Interim Problems from his last visit: no  HYPERTENSION / HYPERLIPIDEMIA Taking Metoprolol 50 MG BID, Amiodarone 200 MG daily, Olmesartan 20 MG daily.  Discussed statin therapy with patient and current ASCVD.   Satisfied with current treatment? yes Duration of hypertension: years BP monitoring frequency: a few times a week BP range:  BP medication side effects: no Duration of hyperlipidemia: years Cholesterol medication side effects:  not taking Cholesterol supplements: none, fish oil, niacin, and red yeast rice Medication compliance: excellent compliance Aspirin: no Recent stressors: no Recurrent headaches: no Visual changes: no Palpitations: no Dyspnea: no Chest pain: no Lower extremity edema: no Dizzy/lightheaded: no  The 10-year ASCVD risk score (Arnett DK, et al., 2019) is: 33.6%   Values used to calculate the score:     Age: 42 years     Sex: Male     Is Non-Hispanic African American: No     Diabetic: No     Tobacco smoker: No     Systolic Blood Pressure: 854 mmHg     Is BP treated: Yes     HDL Cholesterol: 54 mg/dL     Total Cholesterol: 157 mg/dL  ATRIAL FIBRILLATION Follows with cardiology 02/05/22, no changes.  Amiodarone and Eliquis. Atrial fibrillation status: stable Satisfied with current treatment: yes  Medication side effects:  no Medication compliance: poor compliance Etiology of atrial  fibrillation:  Palpitations:  no Chest pain:  no Dyspnea on exertion:  no Orthopnea:  no Syncope:  no Edema:  no Ventricular rate control: B-blocker Anti-coagulation: long acting    CHRONIC KIDNEY DISEASE Recent labs were improved, waxes and wanes. CKD status: stable Medications renally dose: yes Previous renal evaluation: yes Pneumovax:  Up to Date Influenza Vaccine:  Up to Date   HYPOTHYROIDISM Continues on Levothyroxine 75 MCG daily. Thyroid control status:stable Satisfied with current treatment? yes Medication side effects: no Medication compliance: excellent compliance Etiology of hypothyroidism:  Recent dose adjustment:yes Fatigue: no Cold intolerance: no Heat intolerance: no Weight gain: no Weight loss: no Constipation: no Diarrhea/loose stools: no Palpitations: no Lower extremity edema: no Anxiety/depressed mood: no    BPH Takes Finasteride -- history of prostate CA (received implants - seeds). Last saw Dr. Diamantina Providence, urology, 11/09/20.   BPH status: stable Satisfied with current treatment?: yes Medication side effects: no Medication compliance: excellent compliance Duration: years Nocturia: 1-2x per night Urinary frequency:no Incomplete voiding: no Urgency: no Weak urinary stream: yes Straining to start stream: no Dysuria: no Onset:  n/a Severity:  n/a Alleviating factors:  Aggravating factors:  Treatments attempted:  IPSS Questionnaire (AUA-7): Over the past month.   1)  How often have you had a sensation of not emptying your bladder completely after you finish urinating?  0 - Not at all  2)  How often have you had to urinate again  less than two hours after you finished urinating? 0 - Not at all  3)  How often have you found you stopped and started again several times when you urinated?  0 - Not at all  4) How difficult have you found it to postpone urination?  0 - Not at all  5) How often have you had a weak urinary stream?  1 - Less than 1 time  in 5  6) How often have you had to push or strain to begin urination?  0 - Not at all  7) How many times did you most typically get up to urinate from the time you went to bed until the time you got up in the morning?  0 - None  Total score:  0-7 mildly symptomatic   8-19 moderately symptomatic   20-35 severely symptomatic    Functional Status Survey: Is the patient deaf or have difficulty hearing?: No Does the patient have difficulty seeing, even when wearing glasses/contacts?: No Does the patient have difficulty concentrating, remembering, or making decisions?: No Does the patient have difficulty walking or climbing stairs?: No Does the patient have difficulty dressing or bathing?: No Does the patient have difficulty doing errands alone such as visiting a doctor's office or shopping?: No  FALL RISK:    11/09/2021   10:29 AM 11/06/2021   11:03 AM 10/11/2021    9:31 AM 10/02/2020    9:57 AM 04/13/2020    8:47 AM  [place] in the past year? 0 0 0 0 0  Number falls in past yr: 0 0 0    Injury with Fall? 0 0 0    Risk for fall due to : No Fall Risks  No Fall Risks Medication side effect   Follow up Falls evaluation completed Falls evaluation completed;Education provided;Falls prevention discussed Falls evaluation completed Falls evaluation completed;Education provided;Falls prevention discussed     Depression Screen    11/09/2021   10:29 AM 11/06/2021   11:19 AM 10/11/2021    9:31 AM 04/13/2021    8:52 AM 10/02/2020    9:57 AM  Depression screen PHQ 2/9  Decreased Interest 0 0 0 0 0  Down, Depressed, Hopeless 0 0 0 0 0  PHQ - 2 Score 0 0 0 0 0  Altered sleeping 0  0 0   Tired, decreased energy 0  0 0   Change in appetite 0  0 0   Feeling bad or failure about yourself  0  0 0   Trouble concentrating 0  0 0   Moving slowly or fidgety/restless 0  0 0   Suicidal thoughts 0  0 0   PHQ-9 Score 0  0 0   Difficult doing work/chores Not difficult at all  Not difficult at all Not  difficult at all     Advanced Directives Does patient have a HCPOA?    no If yes, name and contact information:  Does patient have a living will or MOST form?  no  Past Medical History:  Past Medical History:  Diagnosis Date   A-fib ([place])    Atrial fibrillation with RVR ([place])    Dupuytren's contracture    Essential hypertension 09/05/2014   Hypertension    Hypokalemia    Hypothyroidism    Malignant neoplasm of prostate ([place]) 09/05/2014   prostate,skin of the scalp   Mitral regurgitation    a. echo 03/2015: echo 03/2015: EF 50-55%, no RWMA, no sufficienct to allow for  LV diastolic fxn, mild MR, LA mildly dilated at 46 mm, RV systolic fxn nl, PASP nl   Nephrolithiasis    New onset atrial fibrillation ([place]) 04/18/2015   OSA (obstructive sleep apnea) 05/11/2015   Persistent atrial fibrillation [facility])     Surgical History:  Past Surgical History:  Procedure Laterality Date   COLONOSCOPY WITH PROPOFOL N/A 04/18/2015   Procedure: COLONOSCOPY WITH PROPOFOL;  Surgeon: Lucilla Lame, MD;  Location: [facility] ENDOSCOPY;  Service: Endoscopy;  Laterality: N/A;   ELECTROPHYSIOLOGIC STUDY N/A 05/05/2015   Procedure: CARDIOVERSION;  Surgeon: Wellington Hampshire, MD;  Location: [facility] ORS;  Service: Cardiovascular;  Laterality: N/A;   RADIOACTIVE SEED IMPLANT N/A     Medications:  Current Outpatient Medications on File Prior to Visit  Medication Sig   amiodarone (PACERONE) 200 MG tablet Take 1 tablet by mouth once daily   apixaban (ELIQUIS) 5 MG TABS tablet Take 1 tablet by mouth twice daily   Cholecalciferol (VITAMIN D-3) 125 MCG (5000 UT) TABS Take 5,000 Units by mouth daily.   finasteride (PROSCAR) 5 MG tablet Take 1 tablet (5 mg total) by mouth daily.   levothyroxine (EUTHYROX) 75 MCG tablet Take 1 tablet (75 mcg total) by mouth daily.   metoprolol tartrate (LOPRESSOR) 50 MG tablet Take 1 tablet by mouth twice daily   Multiple Vitamin (MULTIVITAMIN) tablet Take 1 tablet by mouth daily.   olmesartan  (BENICAR) 20 MG tablet Take 1 tablet (20 mg total) by mouth daily.   Zinc 50 MG TABS Take 50 mg by mouth daily.   No current facility-administered medications on file prior to visit.    Allergies:  No Known Allergies  Social History:  Social History   Socioeconomic History   Marital status: Married    Spouse name: Not on file   Number of children: Not on file   Years of education: Not on file   Highest education level: Associate degree: academic program  Occupational History   Occupation: retired   Tobacco Use   Smoking status: Former    Types: Cigarettes    Quit date: 09/05/1994    Years since quitting: 27.6   Smokeless tobacco: Former    Types: Chew    Quit date: 09/04/2004  Vaping Use   Vaping Use: Never used  Substance and Sexual Activity   Alcohol use: No   Drug use: No   Sexual activity: Not Currently  Other Topics Concern   Not on file  Social History Narrative   Plays racket ball twice a week, works outside    [place] Strain: [place]  (11/06/2021)   Overall Financial Resource Strain (CARDIA)    Difficulty of Paying Living Expenses: Not hard at all  Food Insecurity: No [place] (11/06/2021)   Hunger Vital Sign    Worried About Running Out of Food in the Last Year: Never true    [place] in the Last Year: Never true  Transportation Needs: No Transportation Needs (10/02/2020)   PRAPARE - Hydrologist (Medical): No    Lack of Transportation (Non-Medical): No  Physical Activity: Insufficiently Active (11/06/2021)   Exercise Vital Sign    Days of Exercise per Week: 2 days    Minutes of Exercise per Session: 50 min  Stress: No Stress Concern Present (11/06/2021)   [place]    Feeling of Stress : Not at all  Social  Connections: Socially Integrated (11/06/2021)   Social Connection and Isolation Panel [NHANES]     Frequency of Communication with Friends and Family: Twice a week    Frequency of Social Gatherings with Friends and Family: Once a week    Attends Religious Services: More than 4 times per year    Active Member of [employer] or Organizations: Yes    Attends Music therapist: More than 4 times per year    Marital Status: Married  Human resources officer Violence: Not At Risk (11/06/2021)   Humiliation, Afraid, Rape, and Kick questionnaire    Fear of Current or Ex-Partner: No    Emotionally Abused: No    Physically Abused: No    Sexually Abused: No   Social History   Tobacco Use  Smoking Status Former   Types: Cigarettes   Quit date: 09/05/1994   Years since quitting: 27.6  Smokeless Tobacco Former   Types: Chew   Quit date: 09/04/2004   Social History   Substance and Sexual Activity  Alcohol Use No    Family History:  Family History  Problem Relation Age of Onset   Heart attack Mother    Heart disease Father    Arthritis Maternal Grandmother     Past medical history, surgical history, medications, allergies, family history and social history reviewed with patient today and changes made to appropriate areas of the chart.   Review of Systems  Constitutional:  Negative for chills, malaise/fatigue and weight loss.  HENT:  Negative for congestion, hearing loss and sinus pain.   Respiratory:  Negative for cough and shortness of breath.   Cardiovascular:  Negative for chest pain, palpitations and leg swelling.  Gastrointestinal:  Negative for abdominal pain, diarrhea and nausea.  Genitourinary:  Negative for dysuria, frequency and urgency.  Neurological:  Negative for dizziness and headaches.  Psychiatric/Behavioral:  Negative for depression and suicidal ideas.    All other ROS negative except what is listed above and in the HPI.      Objective:    BP (!) 140/70 (BP Location: Left Arm)   Pulse 60   Temp 97.6 F (36.4 C) (Oral)   Ht 6' 0.99" (1.854 m)   Wt 234 lb 3.2  oz (106.2 kg)   SpO2 99%   BMI 30.91 kg/m   Wt Readings from Last 3 Encounters:  04/16/22 234 lb 3.2 oz (106.2 kg)  02/05/22 227 lb 4 oz (103.1 kg)  11/09/21 225 lb 3.2 oz (102.2 kg)    Physical Exam Constitutional:      General: He is not in acute distress.    Appearance: Normal appearance. He is normal weight. He is not ill-appearing or toxic-appearing.  HENT:     Head: Normocephalic and atraumatic.     Right Ear: Tympanic membrane normal.     Left Ear: Tympanic membrane normal.     Nose: Nose normal.     Mouth/Throat:     Mouth: Mucous membranes are moist.  Eyes:     Conjunctiva/sclera: Conjunctivae normal.  Cardiovascular:     Rate and Rhythm: Normal rate and regular rhythm.     Pulses: Normal pulses.     Heart sounds: Normal heart sounds. No murmur heard.    No gallop.  Pulmonary:     Effort: Pulmonary effort is normal.     Breath sounds: Normal breath sounds.  Abdominal:     General: Bowel sounds are normal.     Palpations: Abdomen is soft.  Musculoskeletal:  General: Normal range of motion.     Cervical back: Normal range of motion and neck supple.  Lymphadenopathy:     Cervical: No cervical adenopathy.  Skin:    General: Skin is warm.  Neurological:     General: No focal deficit present.     Mental Status: He is alert and oriented to person, place, and time. Mental status is at baseline.  Psychiatric:        Mood and Affect: Mood normal.        Behavior: Behavior normal.        Thought Content: Thought content normal.        Judgment: Judgment normal.       11/06/2021   11:05 AM 10/02/2020    9:58 AM 10/01/2019    9:51 AM 10/21/2018    8:24 AM 09/28/2018    8:16 AM  6CIT Screen  What Year? 0 points 0 points 0 points 0 points 0 points  What month? 0 points 0 points 0 points 0 points 0 points  What time? 0 points 0 points 0 points 0 points 0 points  Count back from 20 0 points 0 points 0 points 0 points 0 points  Months in reverse 0 points 0 points  2 points 0 points 0 points  Repeat phrase 0 points 0 points 0 points 0 points 0 points  Total Score 0 points 0 points 2 points 0 points 0 points    Results for orders placed or performed in visit on 11/09/21  Urinalysis, Routine w reflex microscopic  Result Value Ref Range   Specific Gravity, UA 1.025 1.005 - 1.030   pH, UA 6.0 5.0 - 7.5   Color, UA Yellow Yellow   Appearance Ur Clear Clear   Leukocytes,UA Negative Negative   Protein,UA Negative Negative/Trace   Glucose, UA Negative Negative   Ketones, UA Negative Negative   RBC, UA Negative Negative   Bilirubin, UA Negative Negative   Urobilinogen, Ur 1.0 0.2 - 1.0 mg/dL   Nitrite, UA Negative Negative  CBC with Differential/Platelet  Result Value Ref Range   WBC 5.9 3.4 - 10.8 x10E3/uL   RBC 4.57 4.14 - 5.80 x10E6/uL   Hemoglobin 14.0 13.0 - 17.7 g/dL   Hematocrit 43.1 37.5 - 51.0 %   MCV 94 79 - 97 fL   MCH 30.6 26.6 - 33.0 pg   MCHC 32.5 31.5 - 35.7 g/dL   RDW 13.8 11.6 - 15.4 %   Platelets 120 (L) 150 - 450 x10E3/uL   Neutrophils 46 Not Estab. %   Lymphs 32 Not Estab. %   Monocytes 20 Not Estab. %   Eos 1 Not Estab. %   Basos 1 Not Estab. %   Neutrophils Absolute 2.7 1.4 - 7.0 x10E3/uL   Lymphocytes Absolute 1.9 0.7 - 3.1 x10E3/uL   Monocytes Absolute 1.2 (H) 0.1 - 0.9 x10E3/uL   EOS (ABSOLUTE) 0.1 0.0 - 0.4 x10E3/uL   Basophils Absolute 0.1 0.0 - 0.2 x10E3/uL   Immature Granulocytes 0 Not Estab. %   Immature Grans (Abs) 0.0 0.0 - 0.1 Q65H8/IO  Basic metabolic panel  Result Value Ref Range   Glucose 98 70 - 99 mg/dL   BUN 21 8 - 27 mg/dL   Creatinine, Ser 1.24 0.76 - 1.27 mg/dL   eGFR 60 >59 mL/min/1.73   BUN/Creatinine Ratio 17 10 - 24   Sodium 142 134 - 144 mmol/L   Potassium 4.1 3.5 - 5.2 mmol/L   Chloride 105 96 -  106 mmol/L   CO2 24 20 - 29 mmol/L   Calcium 9.2 8.6 - 10.2 mg/dL      Assessment & Plan:   Problem List Items Addressed This Visit       Cardiovascular and Mediastinum   Essential  hypertension (Chronic)    Chronic, ongoing with decrease in elevations at home. Pt brings in BP reading list with average of 30 readings being 137/82. Continue current medication regimen and adjust as needed. Educated on medication use and side effects to monitor. Educated on BP goal of less than 140/90. Recommend checking BP 3-4 times weekly and recording + focus on DASH diet. Continue collaboration with cardiology. CBC and CMP today. Return in 6 months      Relevant Orders   CBC with Differential/Platelet   Comprehensive metabolic panel   TSH   Aortic atherosclerosis ([place])    Ongoing. Have discussed statin initiation for prevention and reviewed ASCVD scoring for him and he continues to desire to think about this and repeat lipid panel.      Relevant Orders   Comprehensive metabolic panel   Lipid Panel w/o Chol/HDL Ratio   Atrial fibrillation with RVR ([facility]) - Primary    Chronic, stable with rate controlled. Continue current medication regimen and collaboration with cardiology.      Relevant Orders   Comprehensive metabolic panel   Lipid Panel w/o Chol/HDL Ratio     Respiratory   OSA (obstructive sleep apnea)    Ongoing and stable. Continued CPAP use.         Endocrine   Hypothyroidism (Chronic)    Chronic, ongoing. Continue current medication regimen and adjust as needed. Will get thyroid labs today.      Relevant Orders   T4, free   TSH     Genitourinary   CKD (chronic kidney disease), stage III ([place])    Chronic, noted on labs on and off since 2019. Will get BMP today and monitor closely. Recommend adequate hydration at home, avoid NSAIDs. Refer to nephrology as needed. Continue current medication regimen for kidney protection.      Relevant Orders   Microalbumin, Urine Waived   Comprehensive metabolic panel     Hematopoietic and Hemostatic   Other thrombophilia ([place])    On eliquis for atrial fibrillation. Continue to monitor for bleeding/wounds. CBC today.       Relevant Orders   CBC with Differential/Platelet   Thrombocytopenia ([place])    Will check CBC today. Monitor closely and send to hematology as needed.       Relevant Orders   CBC with Differential/Platelet     Other   BMI 29.0-29.9,adult    Recommended eating smaller high protein, low fat meals more frequently and exercising 30 mins a day 5 times a week with a goal of 10-15lb weight loss in the next 3 months. Patient voiced their understanding and motivation to adhere to these recommendations.       BPH associated with nocturia    AUA 0. History prostate cancer. Continue finasteride. Seeing urology as needed. PSA today.      Relevant Orders   PSA   History of prostate cancer    History prostate cancer with seed placement, denies hematuria. Return to urology if ongoing or worsening symptoms present.      Relevant Orders   PSA   Hyperlipidemia LDL goal <70    Ongoing. No current statin use and he prefers to continue to think about this and discuss with cardiology.  Lipid check today.      Relevant Orders   Comprehensive metabolic panel   Lipid Panel w/o Chol/HDL Ratio   Other Visit Diagnoses     Flu vaccine need       High dose flu dose provided today.   Relevant Orders   Flu Vaccine QUAD High Dose(Fluad) (Completed)   Encounter for annual physical exam       Annual physical today with labs and health maintenance reviewed, discussed with patient.       Discussed aspirin prophylaxis for myocardial infarction prevention and decision was it was not indicated  LABORATORY TESTING:  Health maintenance labs ordered today as discussed above.   The natural history of prostate cancer and ongoing controversy regarding screening and potential treatment outcomes of prostate cancer has been discussed with the patient. The meaning of a false positive PSA and a false negative PSA has been discussed. He indicates understanding of the limitations of this screening test and wishes to  proceed with screening PSA testing.   IMMUNIZATIONS:   - Tdap: Tetanus vaccination status reviewed: last tetanus booster within 10 years. - Influenza: Up to date - Pneumovax: Up to date - Prevnar: Up to date - Zostavax vaccine: Not applicable  SCREENING: - Colonoscopy: Not applicable  Discussed with patient purpose of the colonoscopy is to detect colon cancer at curable precancerous or early stages   - AAA Screening: Not applicable  -Hearing Test: Not applicable  -Spirometry: Not applicable   PATIENT COUNSELING:    Sexuality: Discussed sexually transmitted diseases, partner selection, use of condoms, avoidance of unintended pregnancy  and contraceptive alternatives.   Advised to avoid cigarette smoking.  I discussed with the patient that most people either abstain from alcohol or drink within safe limits (<=14/week and <=4 drinks/occasion for males, <=7/weeks and <= 3 drinks/occasion for females) and that the risk for alcohol disorders and other health effects rises proportionally with the number of drinks per week and how often a drinker exceeds daily limits.  Discussed cessation/primary prevention of drug use and availability of treatment for abuse.   Diet: Encouraged to adjust caloric intake to maintain  or achieve ideal body weight, to reduce intake of dietary saturated fat and total fat, to limit sodium intake by avoiding high sodium foods and not adding table salt, and to maintain adequate dietary potassium and calcium preferably from fresh fruits, vegetables, and low-fat dairy products.    Stressed the importance of regular exercise  Injury prevention: Discussed safety belts, safety helmets, smoke detector, smoking near bedding or upholstery.   Dental health: Discussed importance of regular tooth brushing, flossing, and dental visits.   Follow up plan: NEXT PREVENTATIVE PHYSICAL DUE IN 1 YEAR. Return in about 6 months (around 10/15/2022) for HTN/HLD, A-FIB, THYROID.

## 2022-04-16 NOTE — Assessment & Plan Note (Signed)
Recommended eating smaller high protein, low fat meals more frequently and exercising 30 mins a day 5 times a week with a goal of 10-15lb weight loss in the next 3 months. Patient voiced their understanding and motivation to adhere to these recommendations.  

## 2022-04-16 NOTE — Assessment & Plan Note (Signed)
AUA 0. History prostate cancer. Continue finasteride. Seeing urology as needed. PSA today.

## 2022-04-16 NOTE — Assessment & Plan Note (Signed)
Ongoing and stable. Continued CPAP use.

## 2022-04-17 LAB — COMPREHENSIVE METABOLIC PANEL
ALT: 18 IU/L (ref 0–44)
AST: 22 IU/L (ref 0–40)
Albumin/Globulin Ratio: 2.5 — ABNORMAL HIGH (ref 1.2–2.2)
Albumin: 4.3 g/dL (ref 3.8–4.8)
Alkaline Phosphatase: 123 IU/L — ABNORMAL HIGH (ref 44–121)
BUN/Creatinine Ratio: 11 (ref 10–24)
BUN: 12 mg/dL (ref 8–27)
Bilirubin Total: 1.2 mg/dL (ref 0.0–1.2)
CO2: 24 mmol/L (ref 20–29)
Calcium: 8.9 mg/dL (ref 8.6–10.2)
Chloride: 105 mmol/L (ref 96–106)
Creatinine, Ser: 1.06 mg/dL (ref 0.76–1.27)
Globulin, Total: 1.7 g/dL (ref 1.5–4.5)
Glucose: 95 mg/dL (ref 70–99)
Potassium: 3.8 mmol/L (ref 3.5–5.2)
Sodium: 143 mmol/L (ref 134–144)
Total Protein: 6 g/dL (ref 6.0–8.5)
eGFR: 72 mL/min/{1.73_m2} (ref >59)

## 2022-04-17 LAB — LIPID PANEL W/O CHOL/HDL RATIO
Cholesterol, Total: 137 mg/dL (ref 100–199)
HDL: 47 mg/dL (ref >39)
LDL Chol Calc (NIH): 77 mg/dL (ref 0–99)
Triglycerides: 65 mg/dL (ref 0–149)
VLDL Cholesterol Cal: 13 mg/dL (ref 5–40)

## 2022-04-17 LAB — CBC WITH DIFFERENTIAL/PLATELET
Basophils Absolute: 0.1 10*3/uL (ref 0.0–0.2)
Basos: 1 %
EOS (ABSOLUTE): 0.1 10*3/uL (ref 0.0–0.4)
Eos: 2 %
Hematocrit: 43.6 % (ref 37.5–51.0)
Hemoglobin: 14.6 g/dL (ref 13.0–17.7)
Immature Grans (Abs): 0 10*3/uL (ref 0.0–0.1)
Immature Granulocytes: 0 %
Lymphocytes Absolute: 1.8 10*3/uL (ref 0.7–3.1)
Lymphs: 33 %
MCH: 30.7 pg (ref 26.6–33.0)
MCHC: 33.5 g/dL (ref 31.5–35.7)
MCV: 92 fL (ref 79–97)
Monocytes Absolute: 0.9 10*3/uL (ref 0.1–0.9)
Monocytes: 16 %
Neutrophils Absolute: 2.6 10*3/uL (ref 1.4–7.0)
Neutrophils: 48 %
Platelets: 95 10*3/uL — CL (ref 150–450)
RBC: 4.76 x10E6/uL (ref 4.14–5.80)
RDW: 13.2 % (ref 11.6–15.4)
WBC: 5.5 10*3/uL (ref 3.4–10.8)

## 2022-04-17 LAB — T4, FREE: Free T4: 1.9 ng/dL — ABNORMAL HIGH (ref 0.82–1.77)

## 2022-04-17 LAB — TSH: TSH: 3.98 u[IU]/mL (ref 0.450–4.500)

## 2022-04-17 LAB — PSA: Prostate Specific Ag, Serum: <0.1 ng/mL (ref 0.0–4.0)

## 2022-04-17 NOTE — Progress Notes (Signed)
Contacted via MyChart   Good evening [name] [name], your labs have returned: - CBC continues to show low platelets, these have trended down a little this check to 95.  We will continue to monitor closely and recheck next visit. - Thyroid shows normal TSH and mild elevation Free T4 -- no Levothyroxine changes at this time. - Kidney function, creatinine and eGFR, remains normal, as is liver function, AST and ALT.  - Remainder of labs all stable.  Great news!!  Any questions? Keep being stellar!!  Thank you for allowing me to participate in your care.  I appreciate you. Kindest regards, Shelbi Vaccaro

## 2022-04-18 DIAGNOSIS — L570 Actinic keratosis: Secondary | ICD-10-CM | POA: Diagnosis not present

## 2022-04-18 DIAGNOSIS — Z85828 Personal history of other malignant neoplasm of skin: Secondary | ICD-10-CM | POA: Diagnosis not present

## 2022-04-18 DIAGNOSIS — D2271 Melanocytic nevi of right lower limb, including hip: Secondary | ICD-10-CM | POA: Diagnosis not present

## 2022-04-18 DIAGNOSIS — D044 Carcinoma in situ of skin of scalp and neck: Secondary | ICD-10-CM | POA: Diagnosis not present

## 2022-04-18 DIAGNOSIS — D2261 Melanocytic nevi of right upper limb, including shoulder: Secondary | ICD-10-CM | POA: Diagnosis not present

## 2022-04-18 DIAGNOSIS — X32XXXA Exposure to sunlight, initial encounter: Secondary | ICD-10-CM | POA: Diagnosis not present

## 2022-04-18 DIAGNOSIS — D2262 Melanocytic nevi of left upper limb, including shoulder: Secondary | ICD-10-CM | POA: Diagnosis not present

## 2022-04-18 DIAGNOSIS — D485 Neoplasm of uncertain behavior of skin: Secondary | ICD-10-CM | POA: Diagnosis not present

## 2022-04-24 ENCOUNTER — Other Ambulatory Visit: Payer: Self-pay | Admitting: Cardiovascular Disease

## 2022-05-08 ENCOUNTER — Other Ambulatory Visit: Payer: Self-pay | Admitting: Cardiovascular Disease

## 2022-05-08 ENCOUNTER — Other Ambulatory Visit: Payer: Self-pay | Admitting: Nurse Practitioner

## 2022-05-08 NOTE — Telephone Encounter (Signed)
Per lab-04/17/22- continue same dosing Requested Prescriptions  Pending Prescriptions Disp Refills   levothyroxine (SYNTHROID) 75 MCG tablet [Pharmacy Med Name: Levothyroxine Sodium 75 MCG Oral Tablet] 90 tablet 0    Sig: Take 1 tablet by mouth once daily     Endocrinology:  Hypothyroid Agents Passed - 05/08/2022  8:33 AM      Passed - TSH in normal range and within 360 days    TSH  Date Value Ref Range Status  04/16/2022 3.980 0.450 - 4.500 uIU/mL Final         Passed - Valid encounter within last 12 months    Recent Outpatient Visits           3 weeks ago Atrial fibrillation with RVR ([place])   [place], [place] T, NP   6 months ago Stage 3a chronic kidney disease ([place])   [place], Henrine Screws T, NP   6 months ago Atrial fibrillation with RVR ([place])   [place], Henrine Screws T, NP   1 year ago Atrial fibrillation with RVR ([place])   [place], Henrine Screws T, NP   1 year ago Asymptomatic microscopic hematuria   [place], Barbaraann Faster, NP       Future Appointments             In 5 months [place], Barbaraann Faster, NP [place], [facility]

## 2022-05-21 DIAGNOSIS — D0439 Carcinoma in situ of skin of other parts of face: Secondary | ICD-10-CM | POA: Diagnosis not present

## 2022-08-02 ENCOUNTER — Other Ambulatory Visit: Payer: Self-pay | Admitting: Nurse Practitioner

## 2022-08-02 NOTE — Telephone Encounter (Signed)
Requested Prescriptions  Pending Prescriptions Disp Refills   finasteride (PROSCAR) 5 MG tablet [Pharmacy Med Name: Finasteride 5 MG Oral Tablet] 90 tablet 2    Sig: Take 1 tablet by mouth once daily     Urology: 5-alpha Reductase Inhibitors Passed - 08/02/2022  7:15 AM      Passed - PSA in normal range and within 360 days    PSA  Date Value Ref Range Status  01/10/2012 0.1 0.0 - 4.0 ng/mL Final    Comment:     Roche ECLIA methodology.                                                                      . According to the American Urological Association, Serum PSA should decrease and remain at undetectable levels after radical prostatectomy. The AUA defines biochemical recurrence as an initial PSA value 0.2 ng/mL or greater followed by a subsequent confirmatory PSA value 0.2 ng/mL or greater. Values obtained with different assay methods or kits cannot be used interchangeably. Results cannot be interpreted as absolute evidence of the presence or absence of malignant disease.            [facility]            No: [id number]           [address], [place], [place] [address]           Mila Homer, MD         [phone] Result(s) reported on 11 Jan 2012 at 12:18PM.    Prostate Specific Ag, Serum  Date Value Ref Range Status  04/16/2022 <0.1 0.0 - 4.0 ng/mL Final    Comment:    Roche ECLIA methodology. According to the American Urological Association, Serum PSA should decrease and remain at undetectable levels after radical prostatectomy. The AUA defines biochemical recurrence as an initial PSA value 0.2 ng/mL or greater followed by a subsequent confirmatory PSA value 0.2 ng/mL or greater. Values obtained with different assay methods or kits cannot be used interchangeably. Results cannot be interpreted as absolute evidence of the presence or absence of malignant disease.          Passed - Valid encounter within last 12 months    Recent Outpatient Visits            3 months ago Atrial fibrillation with RVR (HCC)   [place] [facility] [place], Corrie Dandy T, NP   8 months ago Stage 3a chronic kidney disease (HCC)   [place] [facility] [place], Corrie Dandy T, NP   9 months ago Atrial fibrillation with RVR (HCC)   [place] [facility] [place], Corrie Dandy T, NP   1 year ago Atrial fibrillation with RVR (HCC)   [place] [facility] [place], Corrie Dandy T, NP   1 year ago Asymptomatic microscopic hematuria   [place] [facility] [place], Dorie Rank, NP       Future Appointments             In 2 months [place], Dorie Rank, NP  [facility]

## 2022-08-15 ENCOUNTER — Other Ambulatory Visit: Payer: Self-pay | Admitting: Cardiovascular Disease

## 2022-08-15 DIAGNOSIS — I4819 Other persistent atrial fibrillation: Secondary | ICD-10-CM

## 2022-08-15 DIAGNOSIS — I4891 Unspecified atrial fibrillation: Secondary | ICD-10-CM

## 2022-08-15 NOTE — Telephone Encounter (Signed)
Refill request

## 2022-08-15 NOTE — Telephone Encounter (Signed)
Prescription refill request for Eliquis received. Indication: Afib  Last office visit: 02/05/22 Mariah Milling)  Scr: 1.06 (04/16/22)  Age: 77 Weight: 106.2kg  Appropriate dose. Refill sent.

## 2022-09-07 ENCOUNTER — Ambulatory Visit
Admission: EM | Admit: 2022-09-07 | Discharge: 2022-09-07 | Disposition: A | Discharge status: Home / Self Care | Payer: PPO | Attending: Urgent Care | Admitting: Urgent Care

## 2022-09-07 DIAGNOSIS — L040 Acute lymphadenitis of face, head and neck: Secondary | ICD-10-CM

## 2022-09-07 DIAGNOSIS — H60391 Other infective otitis externa, right ear: Secondary | ICD-10-CM | POA: Diagnosis not present

## 2022-09-07 DIAGNOSIS — H6123 Impacted cerumen, bilateral: Secondary | ICD-10-CM

## 2022-09-07 MED ORDER — NEOMYCIN-POLYMYXIN-HC 3.5-10000-1 OT SUSP: 4.0000 [drp] | Freq: Three times a day (TID) | OTIC | 0 refills | Status: AC

## 2022-09-07 NOTE — ED Provider Notes (Signed)
[name] [name]    CSN: [id number] Arrival date & time: 09/07/22  0957      History   Chief Complaint Chief Complaint  Patient presents with   Ear Fullness    HPI [name] [name] is a 77 y.o. male.    Ear Fullness    Presents to urgent care with complaint of swelling on right side of neck and "clogged ear" sensation in ears bilaterally starting 7 days ago.  Found to have very elevated blood pressure 207/102 upon measurement in clinic.  Patient states he is taking all of his prescribed blood pressure medications.  PMH includes hypertension, A-fib with RVR, mitral regurg.  A-fib is treated with anticoagulant therapy.  He states he took "a couple of Aleve" to relieve the discomfort in his neck.  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 echo 03/2015: echo 03/2015: EF 50-55%, no RWMA, no sufficienct to allow for LV diastolic fxn, mild MR, LA mildly dilated at 46 mm, RV systolic fxn nl, PASP nl   Nephrolithiasis    New onset atrial fibrillation (HCC) 04/18/2015   OSA (obstructive sleep apnea) 05/11/2015   Persistent atrial fibrillation [facility])     Patient Active Problem List   Diagnosis Date Noted   BPH associated with nocturia 11/08/2020   Other thrombophilia (HCC) 11/04/2020   CKD (chronic kidney disease), stage III (HCC) 11/04/2020   Aortic atherosclerosis (HCC) 11/03/2020   Thrombocytopenia (HCC) 10/12/2020   Asymptomatic microscopic hematuria 10/11/2020   History of prostate cancer 04/13/2020   BMI 29.0-29.9,adult 10/05/2019   Hyperlipidemia LDL goal <70 10/05/2019   Advanced care planning/counseling discussion 09/12/2016   OSA (obstructive sleep apnea) 05/11/2015   Atrial fibrillation  with RVR (HCC)    Essential hypertension 09/05/2014   Dupuytren's contracture 09/05/2014   Hypothyroidism 09/05/2014    Past Surgical History:  Procedure Laterality Date   COLONOSCOPY WITH PROPOFOL N/A 04/18/2015   Procedure: COLONOSCOPY WITH PROPOFOL;  Surgeon: Midge Minium, MD;  Location: [facility] ENDOSCOPY;  Service: Endoscopy;  Laterality: N/A;   ELECTROPHYSIOLOGIC STUDY N/A 05/05/2015   Procedure: CARDIOVERSION;  Surgeon: Iran Ouch, MD;  Location: [facility] ORS;  Service: Cardiovascular;  Laterality: N/A;   RADIOACTIVE SEED IMPLANT N/A        Home Medications    Prior to Admission medications   Medication Sig Start Date End Date Taking? Authorizing Provider  amiodarone (PACERONE) 200 MG tablet Take 1 tablet by mouth once daily 05/08/22   Antonieta Iba, MD  apixaban (ELIQUIS) 5 MG TABS tablet Take 1 tablet by mouth twice daily 08/15/22   Antonieta Iba, MD  Cholecalciferol (VITAMIN D-3) 125 MCG (5000 UT) TABS Take 5,000 Units by mouth daily.    [provider]  finasteride (PROSCAR) 5 MG tablet Take 1 tablet by mouth once daily 08/02/22   Aura Dials T, NP  levothyroxine (SYNTHROID) 75 MCG tablet Take 1 tablet by mouth once daily 05/08/22   Aura Dials T, NP  metoprolol tartrate (LOPRESSOR) 50 MG tablet Take 1 tablet by mouth twice daily 04/24/22   Antonieta Iba, MD  Multiple Vitamin (MULTIVITAMIN) tablet Take 1 tablet by mouth daily.    [provider]  olmesartan (BENICAR) 20 MG tablet Take 1 tablet (20 mg total) by mouth daily. 02/05/22   Antonieta Iba, MD  Zinc 50 MG TABS Take 50 mg by mouth daily.    [provider]    Family History Family History  Problem Relation Age of Onset   Heart attack Mother    Heart disease Father    Arthritis Maternal Grandmother     Social History Social History   Tobacco Use   Smoking status: Former    Types: Cigarettes    Quit date: 09/05/1994    Years since quitting: 28.0   Smokeless  tobacco: Former    Types: Chew    Quit date: 09/04/2004  Vaping Use   Vaping Use: Never used  Substance Use Topics   Alcohol use: No   Drug use: No     Allergies   Patient has no known allergies.   Review of Systems Review of Systems   Physical Exam Triage Vital Signs ED Triage Vitals  Enc Vitals Group     BP 09/07/22 1110 (!) 207/102     Pulse Rate 09/07/22 1110 60     Resp 09/07/22 1110 18     Temp 09/07/22 1110 98.7 F (37.1 C)     Temp Source 09/07/22 1110 Oral     SpO2 09/07/22 1110 97 %     Weight --      Height --      Head Circumference --      Peak Flow --      Pain Score 09/07/22 1109 2     Pain Loc --      Pain Edu? --      Excl. in GC? --    No data found.  Updated Vital Signs BP (!) 186/96 (BP Location: Left Arm) Comment: patient states he took his BP medication this morning around 0830 (provider made aware)  Pulse 60   Temp 98.7 F (37.1 C) (Oral)   Resp 18   SpO2 97%   Visual Acuity Right Eye Distance:   Left Eye Distance:   Bilateral Distance:    Right Eye Near:   Left Eye Near:    Bilateral Near:     Physical Exam Neck:   Lymphadenopathy:     Cervical: Cervical adenopathy present.     Right cervical: Superficial cervical adenopathy present.  Skin:    General: Skin is warm and dry.  Neurological:     General: No focal deficit present.     Mental Status: He is oriented to person, place, and time.  Psychiatric:        Mood and Affect: Mood normal.        Behavior: Behavior normal.      UC Treatments / Results  Labs (all labs ordered are listed, but only abnormal results are displayed) Labs Reviewed - No data to display  EKG   Radiology No results found.  Procedures Procedures (including critical care time)  Medications Ordered in UC Medications - No data to display  Initial Impression / Assessment and Plan / UC Course  I have reviewed the triage vital signs and the nursing notes.  Pertinent labs & imaging  results that were available during my care of the patient were reviewed by me and considered in my medical decision making (see chart for details).   [name] [name] is a 77 y.o. male presenting with bilateral cerumen, impaction, R neck swelling, anterior neck bruising. Patient is  afebrile without recent antipyretics, satting well on room air. Overall is well appearing though non-toxic, well hydrated, without respiratory distress. Pulmonary exam is unremarkable.  Lungs CTAB without wheezing, rhonchi, rales. RRR without murmurs, rubs, gallops.  EACs are occluded by excess cerumen bilaterally.  TMs not visible initially.  Patient has bruising/petechiae on his anterior neck.  Significant right-sided superficial cervical lymph adenopathy versus thyroid megaly.  Tender to palpation.  Reviewed relevant chart history.   Patient requests ear irrigation to remove cerumen.  Able to remove some using curette.  Have applied Debrox solution and allowing it to soak prior to the RN attempting irrigation.  I have some concern regarding the neck swelling.  It has been present for 3 days and he does report some difficult swallowing (1/10).  We discussed transferring his care to the ED for imaging versus watch and wait and he elects to wait.  Additional concern regarding the bruising/petechiae on his anterior neck.  Possibly related to his use of NSAIDs while in his anticoagulated state (Eliquis).  I reminded him that he should not be using NSAID medications while taking Eliquis because of risk of bleeding.  Incomplete irrigation of his ears and will refer him to ENT.  Process of irrigation has left some irritation in the right ear.  Will prescribe Cortisporin to be used a few days to prevent infection during healing.   Final Clinical Impressions(s) / UC Diagnoses   Final diagnoses:  None   Discharge Instructions   None    ED Prescriptions   None    PDMP not reviewed this encounter.   Charma Igo,  [place] 09/07/22 1212

## 2022-09-07 NOTE — Discharge Instructions (Addendum)
Recommend periodic follow-up with ENT to evacuate the excess cerumen in your ears.  If your symptoms of neck pain worsen, with difficult swallow or difficult breathing, or if the bruising on your neck spreads, recommend immediate evaluation in the emergency room.

## 2022-09-07 NOTE — ED Triage Notes (Signed)
Patient states he noticed swelling to right neck gland and has been having a clogged ear sensation (bilateral that started 7 days ago).  Home interventions: tylenol

## 2022-09-11 DIAGNOSIS — H35033 Hypertensive retinopathy, bilateral: Secondary | ICD-10-CM | POA: Diagnosis not present

## 2022-10-13 NOTE — Patient Instructions (Signed)
Be Involved in Caring For Your Health:  Taking Medications When medications are taken as directed, they can greatly improve your health. But if they are not taken as prescribed, they may not work. In some cases, not taking them correctly can be harmful. To help ensure your treatment remains effective and safe, understand your medications and how to take them. Bring your medications to each visit for review by your provider.  Your lab results, notes, and after visit summary will be available on My Chart. We strongly encourage you to use this feature. If lab results are abnormal the clinic will contact you with the appropriate steps. If the clinic does not contact you assume the results are satisfactory. You can always view your results on My Chart. If you have questions regarding your health or results, please contact the clinic during office hours. You can also ask questions on My Chart.  We at [facility] are grateful that you chose [place] to provide your care. We strive to provide evidence-based and compassionate care and are always looking for feedback. If you get a survey from the clinic please complete this so we can hear your opinions.  Atrial Fibrillation Atrial fibrillation (AFib) is a type of heartbeat that is irregular or fast. If you have AFib, your heart beats without any order. This makes it hard for your heart to pump blood in a normal way. AFib may come and go, or it may become a long-lasting problem. If AFib is not treated, it can put you at higher risk for stroke, heart failure, and other heart problems. What are the causes? AFib may be caused by diseases that damage the heart's electrical system. They include: High blood pressure. Heart failure. Heart valve diseases. Heart surgery. Diabetes. Thyroid disease. Kidney disease. Lung diseases, such as pneumonia or COPD. Sleep apnea. Sometimes the cause is not known. What increases the risk? You are more likely to  develop AFib if: You are older. You exercise often and very hard. You have a family history of AFib. You are male. You are Caucasian. You are overweight. You smoke. You drink a lot of alcohol. What are the signs or symptoms? Common symptoms of this condition include: A feeling that your heart is beating very fast. Chest pain or discomfort. Feeling short of breath. Suddenly feeling light-headed or weak. Getting tired easily during activity. Fainting. Sweating. In some cases, there are no symptoms. How is this treated? Medicines to: Prevent blood clots. Treat heart rate or heart rhythm problems. Using devices, such as a pacemaker, to correct heart rhythm problems. Doing surgery to remove the part of the heart that sends bad signals. Closing an area where clots can form in the heart (left atrial appendage). In some cases, your doctor will treat other underlying conditions. Follow these instructions at home: Medicines Take over-the-counter and prescription medicines only as told by your doctor. Do not take any new medicines without first talking to your doctor. If you are taking blood thinners: Talk with your doctor before taking aspirin or NSAIDs, such as ibuprofen. Take your medicines as told. Take them at the same time each day. Do not do things that could hurt or bruise you. Be careful to avoid falls. Wear an alert bracelet or carry a card that says you take blood thinners. Lifestyle Do not smoke or use any products that contain nicotine or tobacco. If you need help quitting, ask your doctor. Eat heart-healthy foods. Talk with your doctor about the right eating plan  for you. Exercise regularly as told by your doctor. Do not drink alcohol. Lose weight if you are overweight. General instructions If you have sleep apnea, treat it as told by your doctor. Do not use diet pills unless your doctor says they are safe for you. Diet pills may make heart problems worse. Keep all  follow-up visits. Your doctor will check your heart rate and rhythm regularly. Contact a doctor if: You notice a change in the speed, rhythm, or strength of your heartbeat. You are taking a blood-thinning medicine and you get more bruising. You get tired more easily when you move or exercise. You have a sudden change in weight. Get help right away if:  You have pain in your chest. You have trouble breathing. You have side effects of blood thinners, such as blood in your vomit, poop (stool), or pee (urine), or bleeding that cannot stop. You have any signs of a stroke. "BE FAST" is an easy way to remember the main warning signs: B - Balance. Dizziness, sudden trouble walking, or loss of balance. E - Eyes. Trouble seeing or a change in how you see. F - Face. Sudden weakness or loss of feeling in the face. The face or eyelid may droop on one side. A - Arms.Weakness or loss of feeling in an arm. This happens suddenly and usually on one side of the body. S - Speech. Sudden trouble speaking, slurred speech, or trouble understanding what people say. T - Time.Time to call emergency services. Write down what time symptoms started. You have other signs of a stroke, such as: A sudden, very bad headache with no known cause. Feeling like you may vomit (nausea). Vomiting. A seizure. These symptoms may be an emergency. Get help right away. Call 911. Do not wait to see if the symptoms will go away. Do not drive yourself to the hospital. This information is not intended to replace advice given to you by your health care provider. Make sure you discuss any questions you have with your health care provider. Document Revised: 11/21/2021 Document Reviewed: 11/21/2021 Elsevier Patient Education  2024 [employer].

## 2022-10-15 ENCOUNTER — Encounter: Payer: Self-pay | Admitting: Nurse Practitioner

## 2022-10-15 ENCOUNTER — Ambulatory Visit (INDEPENDENT_AMBULATORY_CARE_PROVIDER_SITE_OTHER): Payer: PPO | Admitting: Nurse Practitioner

## 2022-10-15 VITALS — BP 168/80 | HR 55 | Temp 98.2°F | Ht 72.99 in | Wt 235.8 lb

## 2022-10-15 DIAGNOSIS — I4891 Unspecified atrial fibrillation: Secondary | ICD-10-CM | POA: Diagnosis not present

## 2022-10-15 DIAGNOSIS — N1831 Chronic kidney disease, stage 3a: Secondary | ICD-10-CM

## 2022-10-15 DIAGNOSIS — D6869 Other thrombophilia: Secondary | ICD-10-CM | POA: Diagnosis not present

## 2022-10-15 DIAGNOSIS — G4733 Obstructive sleep apnea (adult) (pediatric): Secondary | ICD-10-CM | POA: Diagnosis not present

## 2022-10-15 DIAGNOSIS — D696 Thrombocytopenia, unspecified: Secondary | ICD-10-CM

## 2022-10-15 DIAGNOSIS — Z6829 Body mass index (BMI) 29.0-29.9, adult: Secondary | ICD-10-CM | POA: Diagnosis not present

## 2022-10-15 DIAGNOSIS — E785 Hyperlipidemia, unspecified: Secondary | ICD-10-CM

## 2022-10-15 DIAGNOSIS — I700 Atherosclerosis of aorta: Secondary | ICD-10-CM | POA: Diagnosis not present

## 2022-10-15 DIAGNOSIS — E039 Hypothyroidism, unspecified: Secondary | ICD-10-CM | POA: Diagnosis not present

## 2022-10-15 DIAGNOSIS — I10 Essential (primary) hypertension: Secondary | ICD-10-CM | POA: Diagnosis not present

## 2022-10-15 MED ORDER — FINASTERIDE 5 MG PO TABS: 5.0000 mg | ORAL_TABLET | Freq: Every day | ORAL | 4 refills | Status: DC

## 2022-10-15 MED ORDER — OLMESARTAN MEDOXOMIL 40 MG PO TABS: 40.0000 mg | ORAL_TABLET | Freq: Every day | ORAL | 4 refills | Status: DC

## 2022-10-15 MED ORDER — LEVOTHYROXINE SODIUM 75 MCG PO TABS: 75.0000 ug | ORAL_TABLET | Freq: Every day | ORAL | 4 refills | Status: DC

## 2022-10-15 NOTE — Assessment & Plan Note (Signed)
Chronic, ongoing.  Continue current medication regimen and adjust as needed. Thyroid labs up to date, monitor closely as is also taking Amiodarone.

## 2022-10-15 NOTE — Assessment & Plan Note (Signed)
Chronic, noted on past labs on and off since 2019 -- recent improved.  Monitor closely and recommend adequate hydration at home + avoid NSAIDs.  CMP today.  Refer to nephrology as needed.  Continue Olmesartan for BP, which may also offer benefit to kidneys.

## 2022-10-15 NOTE — Assessment & Plan Note (Signed)
Recommended eating smaller high protein, low fat meals more frequently and exercising 30 mins a day 5 times a week with a goal of 10-15lb weight loss in the next 3 months. Patient voiced their understanding and motivation to adhere to these recommendations.  

## 2022-10-15 NOTE — Assessment & Plan Note (Signed)
Ongoing.  No current statin, discussed at length current levels and ASCVD 43.5%.  He wishes to further discuss with cardiology.  If statin initiated would need to be low dose and assess closely as takes Amiodarone.  Lipid check today.

## 2022-10-15 NOTE — Assessment & Plan Note (Signed)
Ongoing and stable.  Continue use of CPAP 100% of the time.

## 2022-10-15 NOTE — Progress Notes (Signed)
BP (!) 168/80 (BP Location: Left Arm, Patient Position: Sitting, Cuff Size: Normal)   Pulse (!) 55   Temp 98.2 F (36.8 C) (Oral)   Ht 6' 0.99" (1.854 m)   Wt 235 lb 12.8 oz (107 kg)   SpO2 98%   BMI 31.12 kg/m    Subjective:    Patient ID: [name] [name], male    DOB: [birth year], 76 y.o.   MRN: [id number]  HPI: [name] [name] is a 77 y.o. male  Chief Complaint  Patient presents with   Hypertension   Hyperlipidemia   Atrial Fibrillation   Thyroid   HYPERTENSION / HYPERLIPIDEMIA Continues on Metoprolol 50 MG BID, Amiodarone 200 MG daily, Olmesartan 20 MG daily.  Discussed statin therapy with patient and current ASCVD.  Continues to use CPAP at home. Satisfied with current treatment? yes Duration of hypertension: chronic BP monitoring frequency: a few times a week BP range: 127/69 to 146/79 -- HR 50-60 range BP medication side effects: no Duration of hyperlipidemia: years Cholesterol medication side effects: not taking Cholesterol supplements: none Medication compliance: excellent compliance Aspirin: no Recent stressors: no Recurrent headaches: no Visual changes: no Palpitations: no Dyspnea: no Chest pain: no Lower extremity edema: no Dizzy/lightheaded: no  The 10-year ASCVD risk score (Arnett DK, et al., 2019) is: 43.5%   Values used to calculate the score:     Age: 10 years     Sex: Male     Is Non-Hispanic African American: No     Diabetic: No     Tobacco smoker: No     Systolic Blood Pressure: 168 mmHg     Is BP treated: Yes     HDL Cholesterol: 47 mg/dL     Total Cholesterol: 137 mg/dL   ATRIAL FIBRILLATION Follows with cardiology 02/05/22, with no changes made.  Amiodarone and Eliquis. Atrial fibrillation status: stable Satisfied with current treatment: yes  Medication side effects:  no Medication compliance: poor compliance Etiology of atrial fibrillation:  Palpitations:  no Chest pain:  no Dyspnea on exertion:  no Orthopnea:   no Syncope:  no Edema:  no Ventricular rate control: B-blocker Anti-coagulation: long acting    CHRONIC KIDNEY DISEASE Recent labs have improved, over a year ago had lower levels. CKD status: stable Medications renally dose: yes Previous renal evaluation: yes Pneumovax:  Up to Date Influenza Vaccine:  Up to Date   HYPOTHYROIDISM Continues on Levothyroxine 75 MCG daily. Thyroid control status:stable Satisfied with current treatment? yes Medication side effects: no Medication compliance: excellent compliance Etiology of hypothyroidism:  Recent dose adjustment:yes Fatigue: no Cold intolerance: no Heat intolerance: no Weight gain: no Weight loss: no Constipation: no Diarrhea/loose stools: no Palpitations: no Lower extremity edema: no Anxiety/depressed mood: no   Relevant past medical, surgical, family and social history reviewed and updated as indicated. Interim medical history since our last visit reviewed. Allergies and medications reviewed and updated.  Review of Systems  Constitutional:  Negative for activity change, diaphoresis, fatigue and fever.  Respiratory:  Negative for cough, chest tightness, shortness of breath and wheezing.   Cardiovascular:  Negative for chest pain, palpitations and leg swelling.  Gastrointestinal: Negative.   Endocrine: Negative.   Genitourinary:  Negative for dysuria, frequency, hematuria and urgency.  Neurological: Negative.   Psychiatric/Behavioral: Negative.      Per HPI unless specifically indicated above     Objective:    BP (!) 168/80 (BP Location: Left Arm, Patient Position: Sitting, Cuff Size: Normal)   Pulse Marland Kitchen)  55   Temp 98.2 F (36.8 C) (Oral)   Ht 6' 0.99" (1.854 m)   Wt 235 lb 12.8 oz (107 kg)   SpO2 98%   BMI 31.12 kg/m   Wt Readings from Last 3 Encounters:  10/15/22 235 lb 12.8 oz (107 kg)  04/16/22 234 lb 3.2 oz (106.2 kg)  02/05/22 227 lb 4 oz (103.1 kg)    Physical Exam Vitals and nursing note reviewed.   Constitutional:      General: He is awake. He is not in acute distress.    Appearance: He is well-developed and well-groomed. He is obese. He is not ill-appearing.  HENT:     Head: Normocephalic and atraumatic.     Right Ear: Hearing normal. No drainage.     Left Ear: Hearing normal. No drainage.  Eyes:     General: Lids are normal.        Right eye: No discharge.        Left eye: No discharge.     Conjunctiva/sclera: Conjunctivae normal.     Pupils: Pupils are equal, round, and reactive to light.  Neck:     Thyroid: No thyromegaly.     Vascular: No carotid bruit.     Trachea: Trachea normal.  Cardiovascular:     Rate and Rhythm: Regular rhythm. Bradycardia present.     Heart sounds: Normal heart sounds, S1 normal and S2 normal. No murmur heard.    No gallop.  Pulmonary:     Effort: Pulmonary effort is normal. No accessory muscle usage or respiratory distress.     Breath sounds: Normal breath sounds.  Abdominal:     General: Bowel sounds are normal. There is no distension.     Palpations: Abdomen is soft.     Tenderness: There is no abdominal tenderness.  Musculoskeletal:     Cervical back: Normal range of motion and neck supple.     Right lower leg: No edema.     Left lower leg: No edema.  Skin:    General: Skin is warm and dry.     Capillary Refill: Capillary refill takes less than 2 seconds.  Neurological:     Mental Status: He is alert and oriented to person, place, and time.     Deep Tendon Reflexes: Reflexes are normal and symmetric.  Psychiatric:        Attention and Perception: Attention normal.        Mood and Affect: Mood normal.        Speech: Speech normal.        Behavior: Behavior normal. Behavior is cooperative.        Thought Content: Thought content normal.    Results for orders placed or performed in visit on 04/16/22  Microalbumin, Urine Waived  Result Value Ref Range   Microalb, Ur Waived 10 0 - 19 mg/L   Creatinine, Urine Waived 10 10 - 300  mg/dL   Microalb/Creat Ratio 30-300 (H) <30 mg/g  T4, free  Result Value Ref Range   Free T4 1.90 (H) 0.82 - 1.77 ng/dL  CBC with Differential/Platelet  Result Value Ref Range   WBC 5.5 3.4 - 10.8 x10E3/uL   RBC 4.76 4.14 - 5.80 x10E6/uL   Hemoglobin 14.6 13.0 - 17.7 g/dL   Hematocrit 88.4 16.6 - 51.0 %   MCV 92 79 - 97 fL   MCH 30.7 26.6 - 33.0 pg   MCHC 33.5 31.5 - 35.7 g/dL   RDW 06.3 01.6 - 01.0 %  Platelets 95 (LL) 150 - 450 x10E3/uL   Neutrophils 48 Not Estab. %   Lymphs 33 Not Estab. %   Monocytes 16 Not Estab. %   Eos 2 Not Estab. %   Basos 1 Not Estab. %   Neutrophils Absolute 2.6 1.4 - 7.0 x10E3/uL   Lymphocytes Absolute 1.8 0.7 - 3.1 x10E3/uL   Monocytes Absolute 0.9 0.1 - 0.9 x10E3/uL   EOS (ABSOLUTE) 0.1 0.0 - 0.4 x10E3/uL   Basophils Absolute 0.1 0.0 - 0.2 x10E3/uL   Immature Granulocytes 0 Not Estab. %   Immature Grans (Abs) 0.0 0.0 - 0.1 x10E3/uL   Hematology Comments: Note:   Comprehensive metabolic panel  Result Value Ref Range   Glucose 95 70 - 99 mg/dL   BUN 12 8 - 27 mg/dL   Creatinine, Ser 1.61 0.76 - 1.27 mg/dL   eGFR 72 >09 UE/AVW/0.98   BUN/Creatinine Ratio 11 10 - 24   Sodium 143 134 - 144 mmol/L   Potassium 3.8 3.5 - 5.2 mmol/L   Chloride 105 96 - 106 mmol/L   CO2 24 20 - 29 mmol/L   Calcium 8.9 8.6 - 10.2 mg/dL   Total Protein 6.0 6.0 - 8.5 g/dL   Albumin 4.3 3.8 - 4.8 g/dL   Globulin, Total 1.7 1.5 - 4.5 g/dL   Albumin/Globulin Ratio 2.5 (H) 1.2 - 2.2   Bilirubin Total 1.2 0.0 - 1.2 mg/dL   Alkaline Phosphatase 123 (H) 44 - 121 IU/L   AST 22 0 - 40 IU/L   ALT 18 0 - 44 IU/L  Lipid Panel w/o Chol/HDL Ratio  Result Value Ref Range   Cholesterol, Total 137 100 - 199 mg/dL   Triglycerides 65 0 - 149 mg/dL   HDL 47 >11 mg/dL   VLDL Cholesterol Cal 13 5 - 40 mg/dL   LDL Chol Calc (NIH) 77 0 - 99 mg/dL  TSH  Result Value Ref Range   TSH 3.980 0.450 - 4.500 uIU/mL  PSA  Result Value Ref Range   Prostate Specific Ag, Serum <0.1 0.0 -  4.0 ng/mL      Assessment & Plan:   Problem List Items Addressed This Visit       Cardiovascular and Mediastinum   Essential hypertension (Chronic)    Chronic, ongoing.  Elevations in office and at home.  Will increase Olmesartan to 40 MG daily for BP and kidney protection with CKD, and further adjust regimen as needed in future.  Educated him on this medication use and side effects to monitor for.  Recommend checking BP three mornings a week and documenting + focus on DASH diet.  Continue collaboration with cardiology.  LABS: CMP today.  Return in 4 weeks.      Relevant Medications   olmesartan (BENICAR) 40 MG tablet   Other Relevant Orders   Comprehensive metabolic panel   Aortic atherosclerosis (HCC)    Ongoing.  Seen on CT scan 11/02/20 -- have recommended statin initiation for prevention and reviewed imaging and ASCVD scoring with him.  He wishes to think about this and repeat lipid panel.      Relevant Medications   olmesartan (BENICAR) 40 MG tablet   Atrial fibrillation with RVR (HCC) - Primary    Chronic, stable with rate controlled.  Continue current medication regimen and collaboration with caridology.       Relevant Medications   olmesartan (BENICAR) 40 MG tablet   Other Relevant Orders   CBC with Differential/Platelet   Comprehensive metabolic panel  Respiratory   OSA (obstructive sleep apnea)    Ongoing and stable.  Continue use of CPAP 100% of the time.        Endocrine   Hypothyroidism (Chronic)    Chronic, ongoing.  Continue current medication regimen and adjust as needed. Thyroid labs up to date, monitor closely as is also taking Amiodarone.      Relevant Medications   levothyroxine (SYNTHROID) 75 MCG tablet     Genitourinary   CKD (chronic kidney disease), stage III (HCC)    Chronic, noted on past labs on and off since 2019 -- recent improved.  Monitor closely and recommend adequate hydration at home + avoid NSAIDs.  CMP today.  Refer to nephrology  as needed.  Continue Olmesartan for BP, which may also offer benefit to kidneys.      Relevant Orders   CBC with Differential/Platelet   Comprehensive metabolic panel     Hematopoietic and Hemostatic   Other thrombophilia (HCC)    On Eliquis and has A-Fib.  Continue to monitor for any bleeding or wounds.  CBC annually.      Relevant Orders   CBC with Differential/Platelet     Other   BMI 29.0-29.9,adult    Recommended eating smaller high protein, low fat meals more frequently and exercising 30 mins a day 5 times a week with a goal of 10-15lb weight loss in the next 3 months. Patient voiced their understanding and motivation to adhere to these recommendations.       Hyperlipidemia LDL goal <70    Ongoing.  No current statin, discussed at length current levels and ASCVD 43.5%.  He wishes to further discuss with cardiology.  If statin initiated would need to be low dose and assess closely as takes Amiodarone.  Lipid check today.      Relevant Medications   olmesartan (BENICAR) 40 MG tablet   Other Relevant Orders   Comprehensive metabolic panel   Lipid Panel w/o Chol/HDL Ratio     Follow up plan: Return in about 4 weeks (around 11/12/2022) for HTN -- increased Olmesartan to 40 MG.

## 2022-10-15 NOTE — Assessment & Plan Note (Signed)
Chronic, stable with rate controlled.  Continue current medication regimen and collaboration with caridology.

## 2022-10-15 NOTE — Assessment & Plan Note (Addendum)
On Eliquis and has A-Fib.  Continue to monitor for any bleeding or wounds.  CBC annually.

## 2022-10-15 NOTE — Assessment & Plan Note (Signed)
Ongoing.  Seen on CT scan 11/02/20 -- have recommended statin initiation for prevention and reviewed imaging and ASCVD scoring with him.  He wishes to think about this and repeat lipid panel.

## 2022-10-15 NOTE — Assessment & Plan Note (Signed)
Chronic, ongoing.  Elevations in office and at home.  Will increase Olmesartan to 40 MG daily for BP and kidney protection with CKD, and further adjust regimen as needed in future.  Educated him on this medication use and side effects to monitor for.  Recommend checking BP three mornings a week and documenting + focus on DASH diet.  Continue collaboration with cardiology.  LABS: CMP today.  Return in 4 weeks.

## 2022-10-16 ENCOUNTER — Other Ambulatory Visit: Payer: Self-pay | Admitting: Nurse Practitioner

## 2022-10-16 DIAGNOSIS — D696 Thrombocytopenia, unspecified: Secondary | ICD-10-CM

## 2022-10-16 NOTE — Progress Notes (Signed)
Contacted via MyChart   Good afternoon Wali, all labs have returned but your CBC -- if abnormal I will let you know.  Current labs: - Kidney function, creatinine and eGFR, remains normal, as is liver function, AST and ALT.  - Cholesterol labs are stable, but would like to see LDL <70, continue to discuss with cardiology whether they recommend statin as I have.  Any questions?

## 2022-10-16 NOTE — Progress Notes (Signed)
Contacted via MyChart -- lab only visit in 6 weeks please   Good afternoon [name] [name], your CBC returned and platelet count is trending down a little more.  I would like you to return in 6 weeks for lab only visit to recheck this.  Any questions?

## 2022-10-17 NOTE — Progress Notes (Signed)
Scheduled patient on 11/28/2022 @ 10:00 am.

## 2022-10-23 ENCOUNTER — Other Ambulatory Visit: Payer: Self-pay | Admitting: Cardiovascular Disease

## 2022-11-06 ENCOUNTER — Other Ambulatory Visit: Payer: Self-pay | Admitting: Cardiovascular Disease

## 2022-11-09 NOTE — Patient Instructions (Signed)
Be Involved in Caring For Your Health:  Taking Medications When medications are taken as directed, they can greatly improve your health. But if they are not taken as prescribed, they may not work. In some cases, not taking them correctly can be harmful. To help ensure your treatment remains effective and safe, understand your medications and how to take them. Bring your medications to each visit for review by your provider.  Your lab results, notes, and after visit summary will be available on My Chart. We strongly encourage you to use this feature. If lab results are abnormal the clinic will contact you with the appropriate steps. If the clinic does not contact you assume the results are satisfactory. You can always view your results on My Chart. If you have questions regarding your health or results, please contact the clinic during office hours. You can also ask questions on My Chart.  We at [facility] are grateful that you chose us to provide your care. We strive to provide evidence-based and compassionate care and are always looking for feedback. If you get a survey from the clinic please complete this so we can hear your opinions.  DASH Eating Plan DASH stands for Dietary Approaches to Stop Hypertension. The DASH eating plan is a healthy eating plan that has been shown to: Lower high blood pressure (hypertension). Reduce your risk for type 2 diabetes, heart disease, and stroke. Help with weight loss. What are tips for following this plan? Reading food labels Check food labels for the amount of salt (sodium) per serving. Choose foods with less than 5 percent of the Daily Value (DV) of sodium. In general, foods with less than 300 milligrams (mg) of sodium per serving fit into this eating plan. To find whole grains, look for the word "whole" as the first word in the ingredient list. Shopping Buy products labeled as "low-sodium" or "no salt added." Buy fresh foods. Avoid canned  foods and pre-made or frozen meals. Cooking Try not to add salt when you cook. Use salt-free seasonings or herbs instead of table salt or sea salt. Check with your health care provider or pharmacist before using salt substitutes. Do not fry foods. Cook foods in healthy ways, such as baking, boiling, grilling, roasting, or broiling. Cook using oils that are good for your heart. These include olive, canola, avocado, soybean, and sunflower oil. Meal planning  Eat a balanced diet. This should include: 4 or more servings of fruits and 4 or more servings of vegetables each day. Try to fill half of your plate with fruits and vegetables. 6-8 servings of whole grains each day. 6 or less servings of lean meat, poultry, or fish each day. 1 oz is 1 serving. A 3 oz (85 g) serving of meat is about the same size as the palm of your hand. One egg is 1 oz (28 g). 2-3 servings of low-fat dairy each day. One serving is 1 cup (237 mL). 1 serving of nuts, seeds, or beans 5 times each week. 2-3 servings of heart-healthy fats. Healthy fats called omega-3 fatty acids are found in foods such as walnuts, flaxseeds, fortified milks, and eggs. These fats are also found in cold-water fish, such as sardines, salmon, and mackerel. Limit how much you eat of: Canned or prepackaged foods. Food that is high in trans fat, such as fried foods. Food that is high in saturated fat, such as fatty meat. Desserts and other sweets, sugary drinks, and other foods with added sugar. Full-fat   dairy products. Do not salt foods before eating. Do not eat more than 4 egg yolks a week. Try to eat at least 2 vegetarian meals a week. Eat more home-cooked food and less restaurant, buffet, and fast food. Lifestyle When eating at a restaurant, ask if your food can be made with less salt or no salt. If you drink alcohol: Limit how much you have to: 0-1 drink a day if you are male. 0-2 drinks a day if you are male. Know how much alcohol is in  your drink. In the [place] one drink is one 12 oz bottle of beer (355 mL), one 5 oz glass of wine (148 mL), or one 1 oz glass of hard liquor (44 mL). General information Avoid eating more than 2,300 mg of salt a day. If you have hypertension, you may need to reduce your sodium intake to 1,500 mg a day. Work with your provider to stay at a healthy body weight or lose weight. Ask what the best weight range is for you. On most days of the week, get at least 30 minutes of exercise that causes your heart to beat faster. This may include walking, swimming, or biking. Work with your provider or dietitian to adjust your eating plan to meet your specific calorie needs. What foods should I eat? Fruits All fresh, dried, or frozen fruit. Canned fruits that are in their natural juice and do not have sugar added to them. Vegetables Fresh or frozen vegetables that are raw, steamed, roasted, or grilled. Low-sodium or reduced-sodium tomato and vegetable juice. Low-sodium or reduced-sodium tomato sauce and tomato paste. Low-sodium or reduced-sodium canned vegetables. Grains Whole-grain or whole-wheat bread. Whole-grain or whole-wheat pasta. Brown rice. Oatmeal. Quinoa. Bulgur. Whole-grain and low-sodium cereals. Pita bread. Low-fat, low-sodium crackers. Whole-wheat flour tortillas. Meats and other proteins Skinless chicken or turkey. Ground chicken or turkey. Pork with fat trimmed off. Fish and seafood. Egg whites. Dried beans, peas, or lentils. Unsalted nuts, nut butters, and seeds. Unsalted canned beans. Lean cuts of beef with fat trimmed off. Low-sodium, lean precooked or cured meat, such as sausages or meat loaves. Dairy Low-fat (1%) or fat-free (skim) milk. Reduced-fat, low-fat, or fat-free cheeses. Nonfat, low-sodium ricotta or cottage cheese. Low-fat or nonfat yogurt. Low-fat, low-sodium cheese. Fats and oils Soft margarine without trans fats. Vegetable oil. Reduced-fat, low-fat, or light mayonnaise and salad  dressings (reduced-sodium). Canola, safflower, olive, avocado, soybean, and sunflower oils. Avocado. Seasonings and condiments Herbs. Spices. Seasoning mixes without salt. Other foods Unsalted popcorn and pretzels. Fat-free sweets. The items listed above may not be all the foods and drinks you can have. Talk to a dietitian to learn more. What foods should I avoid? Fruits Canned fruit in a light or heavy syrup. Fried fruit. Fruit in cream or butter sauce. Vegetables Creamed or fried vegetables. Vegetables in a cheese sauce. Regular canned vegetables that are not marked as low-sodium or reduced-sodium. Regular canned tomato sauce and paste that are not marked as low-sodium or reduced-sodium. Regular tomato and vegetable juices that are not marked as low-sodium or reduced-sodium. Pickles. Olives. Grains Baked goods made with fat, such as croissants, muffins, or some breads. Dry pasta or rice meal packs. Meats and other proteins Fatty cuts of meat. Ribs. Fried meat. Bacon. Bologna, salami, and other precooked or cured meats, such as sausages or meat loaves, that are not lean and low in sodium. Fat from the back of a pig (fatback). Bratwurst. Salted nuts and seeds. Canned beans with added salt. Canned   or smoked fish. Whole eggs or egg yolks. Chicken or turkey with skin. Dairy Whole or 2% milk, cream, and half-and-half. Whole or full-fat cream cheese. Whole-fat or sweetened yogurt. Full-fat cheese. Nondairy creamers. Whipped toppings. Processed cheese and cheese spreads. Fats and oils Butter. Stick margarine. Lard. Shortening. Ghee. Bacon fat. Tropical oils, such as coconut, palm kernel, or palm oil. Seasonings and condiments Onion salt, garlic salt, seasoned salt, table salt, and sea salt. Worcestershire sauce. Tartar sauce. Barbecue sauce. Teriyaki sauce. Soy sauce, including reduced-sodium soy sauce. Steak sauce. Canned and packaged gravies. Fish sauce. Oyster sauce. Cocktail sauce. Store-bought  horseradish. Ketchup. Mustard. Meat flavorings and tenderizers. Bouillon cubes. Hot sauces. Pre-made or packaged marinades. Pre-made or packaged taco seasonings. Relishes. Regular salad dressings. Other foods Salted popcorn and pretzels. The items listed above may not be all the foods and drinks you should avoid. Talk to a dietitian to learn more. Where to find more information National Heart, Lung, and Blood Institute (NHLBI): nhlbi.nih.gov American Heart Association (AHA): heart.org Academy of Nutrition and Dietetics: eatright.org National Kidney Foundation (NKF): kidney.org This information is not intended to replace advice given to you by your health care provider. Make sure you discuss any questions you have with your health care provider. Document Revised: 03/21/2022 Document Reviewed: 03/21/2022 Elsevier Patient Education  2024 [employer]  

## 2022-11-12 ENCOUNTER — Encounter: Payer: Self-pay | Admitting: Nurse Practitioner

## 2022-11-12 ENCOUNTER — Ambulatory Visit (INDEPENDENT_AMBULATORY_CARE_PROVIDER_SITE_OTHER): Payer: PPO | Admitting: Nurse Practitioner

## 2022-11-12 VITALS — BP 131/66 | HR 61 | Temp 98.0°F | Wt 230.8 lb

## 2022-11-12 DIAGNOSIS — I10 Essential (primary) hypertension: Secondary | ICD-10-CM | POA: Diagnosis not present

## 2022-11-12 DIAGNOSIS — M720 Palmar fascial fibromatosis [Dupuytren]: Secondary | ICD-10-CM

## 2022-11-12 NOTE — Progress Notes (Signed)
BP 131/66 (BP Location: Left Arm, Patient Position: Sitting) Comment: at home this morning  Pulse 61   Temp 98 F (36.7 C)   Wt 230 lb 12.8 oz (104.7 kg)   SpO2 98%   BMI 30.46 kg/m    Subjective:    Patient ID: ZEREK MCCAMISH, male    DOB: 1946-02-10, 77 y.o.   MRN: [id number]  HPI: OLEY DALLIS is a 77 y.o. male  Chief Complaint  Patient presents with   Hypertension    Meds taken around 8 am   Needs referral to hand specialist for Dupuytren's Contracture.  Saw ortho, but not hand specialist.  HYPERTENSION with Chronic Kidney Disease Follow-up today to check BP, increased Olmesartan to 40 MG at visit on 10/15/22.  He continues Metoprolol as ordered by cardiology for a-fib. Hypertension status: stable Satisfied with current treatment? yes Duration of hypertension: chronic BP monitoring frequency:  daily BP range: 114/61 to 147/96 -- there is downward trend to more 120-130/70 range -- calculated average 134/81 BP medication side effects:  no Medication compliance: good compliance Aspirin: no Recurrent headaches: no Visual changes: no Palpitations: no Dyspnea: no Chest pain: no Lower extremity edema: no Dizzy/lightheaded: no   Relevant past medical, surgical, family and social history reviewed and updated as indicated. Interim medical history since our last visit reviewed. Allergies and medications reviewed and updated.  Review of Systems  Constitutional:  Negative for activity change, diaphoresis, fatigue and fever.  Respiratory:  Negative for cough, chest tightness, shortness of breath and wheezing.   Cardiovascular:  Negative for chest pain, palpitations and leg swelling.  Gastrointestinal: Negative.   Endocrine: Negative.   Genitourinary:  Negative for dysuria, frequency, hematuria and urgency.  Neurological: Negative.   Psychiatric/Behavioral: Negative.      Per HPI unless specifically indicated above     Objective:    BP 131/66 (BP Location:  Left Arm, Patient Position: Sitting) Comment: at home this morning  Pulse 61   Temp 98 F (36.7 C)   Wt 230 lb 12.8 oz (104.7 kg)   SpO2 98%   BMI 30.46 kg/m   Wt Readings from Last 3 Encounters:  11/12/22 230 lb 12.8 oz (104.7 kg)  10/15/22 235 lb 12.8 oz (107 kg)  04/16/22 234 lb 3.2 oz (106.2 kg)    Physical Exam Constitutional:      General: He is not in acute distress.    Appearance: Normal appearance. He is normal weight. He is not ill-appearing or toxic-appearing.  HENT:     Head: Normocephalic and atraumatic.     Right Ear: Tympanic membrane normal.     Left Ear: Tympanic membrane normal.     Nose: Nose normal.     Mouth/Throat:     Mouth: Mucous membranes are moist.  Eyes:     Conjunctiva/sclera: Conjunctivae normal.  Cardiovascular:     Rate and Rhythm: Normal rate and regular rhythm.     Pulses: Normal pulses.     Heart sounds: Normal heart sounds. No murmur heard.    No gallop.  Pulmonary:     Effort: Pulmonary effort is normal.     Breath sounds: Normal breath sounds.  Abdominal:     General: Bowel sounds are normal.     Palpations: Abdomen is soft.  Musculoskeletal:        General: Normal range of motion.     Cervical back: Normal range of motion and neck supple.  Lymphadenopathy:     Cervical: No  cervical adenopathy.  Skin:    General: Skin is warm.  Neurological:     General: No focal deficit present.     Mental Status: He is alert and oriented to person, place, and time. Mental status is at baseline.  Psychiatric:        Mood and Affect: Mood normal.        Behavior: Behavior normal.        Thought Content: Thought content normal.        Judgment: Judgment normal.     Results for orders placed or performed in visit on 10/15/22  CBC with Differential/Platelet  Result Value Ref Range   WBC 4.2 3.4 - 10.8 x10E3/uL   RBC 4.29 4.14 - 5.80 x10E6/uL   Hemoglobin 13.1 13.0 - 17.7 g/dL   Hematocrit 03.4 74.2 - 51.0 %   MCV 94 79 - 97 fL   MCH  30.5 26.6 - 33.0 pg   MCHC 32.6 31.5 - 35.7 g/dL   RDW 59.5 63.8 - 75.6 %   Platelets 91 (LL) 150 - 450 x10E3/uL   Neutrophils 45 Not Estab. %   Lymphs 34 Not Estab. %   Monocytes 18 Not Estab. %   Eos 2 Not Estab. %   Basos 1 Not Estab. %   Neutrophils Absolute 1.8 1.4 - 7.0 x10E3/uL   Lymphocytes Absolute 1.4 0.7 - 3.1 x10E3/uL   Monocytes Absolute 0.8 0.1 - 0.9 x10E3/uL   EOS (ABSOLUTE) 0.1 0.0 - 0.4 x10E3/uL   Basophils Absolute 0.1 0.0 - 0.2 x10E3/uL   Immature Granulocytes 0 Not Estab. %   Immature Grans (Abs) 0.0 0.0 - 0.1 x10E3/uL   Hematology Comments: Note:   Comprehensive metabolic panel  Result Value Ref Range   Glucose 99 70 - 99 mg/dL   BUN 12 8 - 27 mg/dL   Creatinine, Ser 4.33 0.76 - 1.27 mg/dL   eGFR 65 >29 JJ/OAC/1.66   BUN/Creatinine Ratio 10 10 - 24   Sodium 144 134 - 144 mmol/L   Potassium 4.2 3.5 - 5.2 mmol/L   Chloride 107 (H) 96 - 106 mmol/L   CO2 25 20 - 29 mmol/L   Calcium 8.8 8.6 - 10.2 mg/dL   Total Protein 5.6 (L) 6.0 - 8.5 g/dL   Albumin 4.2 3.8 - 4.8 g/dL   Globulin, Total 1.4 (L) 1.5 - 4.5 g/dL   Bilirubin Total 0.9 0.0 - 1.2 mg/dL   Alkaline Phosphatase 111 44 - 121 IU/L   AST 28 0 - 40 IU/L   ALT 21 0 - 44 IU/L  Lipid Panel w/o Chol/HDL Ratio  Result Value Ref Range   Cholesterol, Total 138 100 - 199 mg/dL   Triglycerides 71 0 - 149 mg/dL   HDL 44 >06 mg/dL   VLDL Cholesterol Cal 14 5 - 40 mg/dL   LDL Chol Calc (NIH) 80 0 - 99 mg/dL      Assessment & Plan:   Problem List Items Addressed This Visit       Cardiovascular and Mediastinum   Essential hypertension (Chronic)    Chronic, improving with medication changes.  Elevated in office today, suspect white coat present, but at home levels are at goal for age on average.  Will continue Olmesartan to 40 MG daily for BP and kidney protection with CKD, and further adjust regimen as needed in future.  Educated him on this medication use and side effects to monitor for.  Recommend checking  BP three mornings a week and  documenting + focus on DASH diet.  Continue collaboration with cardiology.  LABS: up to date.  Return in February for physical.        Musculoskeletal and Integument   Dupuytren's contracture - Primary    Present to left 5th finger, will place referral to hand specialist for possible injection to area.      Relevant Orders   Ambulatory referral to Hand Surgery     Follow up plan: Return in about 5 months (around 04/21/2023) for Annual Physical after 04/17/23 + Medicare Wellness with nurse needs to be scheduled.

## 2022-11-12 NOTE — Assessment & Plan Note (Signed)
Present to left 5th finger, will place referral to hand specialist for possible injection to area.

## 2022-11-12 NOTE — Assessment & Plan Note (Signed)
Chronic, improving with medication changes.  Elevated in office today, suspect white coat present, but at home levels are at goal for age on average.  Will continue Olmesartan to 40 MG daily for BP and kidney protection with CKD, and further adjust regimen as needed in future.  Educated him on this medication use and side effects to monitor for.  Recommend checking BP three mornings a week and documenting + focus on DASH diet.  Continue collaboration with cardiology.  LABS: up to date.  Return in February for physical.

## 2022-11-19 ENCOUNTER — Ambulatory Visit (INDEPENDENT_AMBULATORY_CARE_PROVIDER_SITE_OTHER): Payer: PPO | Admitting: Emergency Medicine

## 2022-11-19 VITALS — BP 150/80 | HR 53 | Ht 71.0 in | Wt 230.9 lb

## 2022-11-19 DIAGNOSIS — Z0000 Encounter for general adult medical examination without abnormal findings: Secondary | ICD-10-CM

## 2022-11-19 NOTE — Progress Notes (Signed)
Subjective:   [name] [name] is a 77 y.o. male who presents for Medicare Annual/Subsequent preventive examination.  Visit Complete: In person   Review of Systems     Cardiac Risk Factors include: advanced age (>37men, >93 women);hypertension;dyslipidemia;male gender;obesity (BMI >30kg/m2) (A. Fib)     Objective:    Today's Vitals   11/19/22 0829 11/19/22 0830  BP: (!) 150/80   Pulse: (!) 53   SpO2: 97%   Weight: 230 lb 14.4 oz (104.7 kg)   Height: 5\' 11"  (1.803 m)   PainSc:  2    Body mass index is 32.2 kg/m.     11/19/2022    8:39 AM 11/06/2021   11:03 AM 10/02/2020    9:56 AM 10/01/2019    9:48 AM 09/28/2018    8:16 AM 09/03/2017    8:38 AM 08/28/2016    1:04 PM  Advanced Directives  Does Patient Have a Medical Advance Directive? Yes Yes Yes Yes Yes Yes Yes  Type of Estate agent of [place];Living will Healthcare Power of [employer] of [place];Living will Healthcare Power of [place];Living will Living will;Healthcare Power of Attorney Living will;Healthcare Power of [employer] Power of [place];Living will  Does patient want to make changes to medical advance directive? No - Patient declined        Copy of Healthcare Power of Attorney in Chart? No - copy requested Yes - validated most recent copy scanned in chart (See row information) No - copy requested No - copy requested Yes - validated most recent copy scanned in chart (See row information) Yes Yes    Current Medications (verified) Outpatient Encounter Medications as of 11/19/2022  Medication Sig   amiodarone (PACERONE) 200 MG tablet Take 1 tablet by mouth once daily   apixaban (ELIQUIS) 5 MG TABS tablet Take 1 tablet by mouth twice daily   Cholecalciferol (VITAMIN D-3) 125 MCG (5000 UT) TABS Take 5,000 Units by mouth daily.   finasteride (PROSCAR) 5 MG tablet Take 1 tablet (5 mg total) by mouth daily.   levothyroxine (SYNTHROID) 75 MCG tablet Take 1 tablet (75 mcg  total) by mouth daily.   metoprolol tartrate (LOPRESSOR) 50 MG tablet Take 1 tablet by mouth twice daily   Multiple Vitamin (MULTIVITAMIN) tablet Take 1 tablet by mouth daily.   olmesartan (BENICAR) 40 MG tablet Take 1 tablet (40 mg total) by mouth daily.   Zinc 50 MG TABS Take 50 mg by mouth daily.   No facility-administered encounter medications on file as of 11/19/2022.    Allergies (verified) Patient has no known allergies.   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 echo 03/2015: echo 03/2015: EF 50-55%, no RWMA, no sufficienct to allow for LV diastolic fxn, mild MR, LA mildly dilated at 46 mm, RV systolic fxn nl, PASP nl   Nephrolithiasis    New onset atrial fibrillation (HCC) 04/18/2015   OSA (obstructive sleep apnea) 05/11/2015   Persistent atrial fibrillation (HCC)    Past Surgical History:  Procedure Laterality Date   COLONOSCOPY WITH PROPOFOL N/A 04/18/2015   Procedure: COLONOSCOPY WITH PROPOFOL;  Surgeon: Midge Minium, MD;  Location: [facility] ENDOSCOPY;  Service:  Endoscopy;  Laterality: N/A;   ELECTROPHYSIOLOGIC STUDY N/A 05/05/2015   Procedure: CARDIOVERSION;  Surgeon: Iran Ouch, MD;  Location: [facility] ORS;  Service: Cardiovascular;  Laterality: N/A;   RADIOACTIVE SEED IMPLANT N/A    Family History  Problem Relation Age of Onset   Heart attack Mother    Heart disease Father    Arthritis Maternal Grandmother    Social History   Socioeconomic History   Marital status: Married    Spouse name: Rene Kocher   Number of children: 2   Years of education: Not on file   Highest education level: Associate degree: academic program  Occupational History   Occupation: retired     Comment: Chief Financial Officer  Tobacco Use   Smoking status: Former    Current packs/day: 0.00    Average packs/day: 1 pack/day for 30.5 years (30.5 ttl pk-yrs)    Types: Cigarettes    Start date: 1966    Quit date: 09/05/1994    Years since quitting: 28.2   Smokeless tobacco: Former    Types: Chew    Quit date: 09/04/2004  Vaping Use   Vaping status: Never Used  Substance and Sexual Activity   Alcohol use: No   Drug use: No   Sexual activity: Not Currently  Other Topics Concern   Not on file  Social History Narrative   Plays racket ball twice a week, works outside    International aid/development worker of [employer] Strain: Low Risk  (11/19/2022)   Overall Financial Resource Strain (CARDIA)    Difficulty of Paying Living Expenses: Not hard at all  Food Insecurity: No Food Insecurity (11/19/2022)   Hunger Vital Sign    Worried About Running Out of Food in the Last Year: Never true    Ran Out of Food in the Last Year: Never true  Transportation Needs: No Transportation Needs (11/19/2022)   PRAPARE - Administrator, Civil Service (Medical): No    Lack of Transportation (Non-Medical): No  Physical Activity: Sufficiently Active (11/19/2022)   Exercise Vital Sign    Days of Exercise per Week: 2 days    Minutes of Exercise per Session: 120 min  Stress: No Stress Concern Present (11/19/2022)   [employer] of Occupational Health - Occupational Stress Questionnaire    Feeling of Stress : Not at all  Social Connections: Socially Integrated (11/19/2022)   Social Connection and Isolation Panel [NHANES]    Frequency of Communication with Friends and Family: Three times a week    Frequency of Social Gatherings with Friends and Family: More than three times a week    Attends Religious Services: More than 4 times per year    Active Member of [employer] or Organizations: Yes    Attends Banker Meetings: Never    Marital Status: Married    Tobacco Counseling Counseling given: Not  Answered   Clinical Intake:  Pre-visit preparation completed: Yes  Pain : 0-10 Pain Score: 2  Pain Type: Acute pain Pain Location: Hand Pain Orientation: Left Pain Descriptors / Indicators: Aching     BMI - recorded: 32.2 Nutritional Status: BMI > 30  Obese Diabetes: No  How often do you need to have someone help you when you read instructions, pamphlets, or other written materials from your doctor or pharmacy?: 1 - Never  Interpreter Needed?: No  Information entered by :: Tora Kindred, CMA   Activities of Daily Living    11/19/2022    8:32 AM  04/16/2022    1:11 PM  In your present state of health, do you have any difficulty performing the following activities:  Hearing? 0 0  Vision? 0 0  Difficulty concentrating or making decisions? 0 0  Walking or climbing stairs? 0 0  Dressing or bathing? 0 0  Doing errands, shopping? 0 0  Preparing Food and eating ? N   Using the Toilet? N   In the past six months, have you accidently leaked urine? N   Do you have problems with loss of bowel control? N   Managing your Medications? N   Managing your Finances? N   Housekeeping or managing your Housekeeping? N     Patient Care Team: Marjie Skiff, NP as PCP - General (Nurse Practitioner) Midge Minium, MD as Consulting Physician (Gastroenterology) Antonieta Iba, MD as Consulting Physician (Cardiology) Dasher, Cliffton Asters, MD as Consulting Physician (Dermatology)  Indicate any recent Medical Services you may have received from other than [facility] providers in the past year (date may be approximate).     Assessment:   This is a routine wellness examination for Yusha.  Hearing/Vision screen Hearing Screening - Comments:: Denies hearing loss Vision Screening - Comments:: Gets routine eye exams  Dietary issues and exercise activities discussed:     Goals Addressed               This Visit's Progress     Patient Stated (pt-stated)        Exercise more       Depression Screen    11/19/2022    8:37 AM 11/12/2022   10:26 AM 11/09/2021   10:29 AM 11/06/2021   11:19 AM 10/11/2021    9:31 AM 04/13/2021    8:52 AM 10/02/2020    9:57 AM  PHQ 2/9 Scores  PHQ - 2 Score 0 0 0 0 0 0 0  PHQ- 9 Score 0  0  0 0     Fall Risk    11/19/2022    8:40 AM 11/12/2022   10:26 AM 11/09/2021   10:29 AM 11/06/2021   11:03 AM 10/11/2021    9:31 AM  Fall Risk   Falls in the past year? 0 0 0 0 0  Number falls in past yr: 0 0 0 0 0  Injury with Fall? 0 0 0 0 0  Risk for fall due to : No Fall Risks No Fall Risks No Fall Risks  No Fall Risks  Follow up Falls prevention discussed Falls evaluation completed Falls evaluation completed Falls evaluation completed;Education provided;Falls prevention discussed Falls evaluation completed    MEDICARE RISK AT HOME: Medicare Risk at Home Any stairs in or around the home?: Yes If so, are there any without handrails?: Yes Home free of loose throw rugs in walkways, pet beds, electrical cords, etc?: Yes Adequate lighting in your home to reduce risk of falls?: Yes Life alert?: No Use of a cane, walker or w/c?: No Grab bars in the bathroom?: No Shower chair or bench in shower?: No Elevated toilet seat or a handicapped toilet?: Yes  TIMED UP AND GO:  Was the test performed?  Yes  Length of time to ambulate 10 feet: 10 sec Gait steady and fast without use of assistive device    Cognitive Function:        11/19/2022    8:41 AM 11/06/2021   11:05 AM 10/02/2020    9:58 AM 10/01/2019    9:51 AM 10/21/2018    8:24  AM  6CIT Screen  What Year? 0 points 0 points 0 points 0 points 0 points  What month? 0 points 0 points 0 points 0 points 0 points  What time? 0 points 0 points 0 points 0 points 0 points  Count back from 20 0 points 0 points 0 points 0 points 0 points  Months in reverse 0 points 0 points 0 points 2 points 0 points  Repeat phrase 0 points 0 points 0 points 0 points 0 points  Total Score 0 points 0 points 0 points 2  points 0 points    Immunizations Immunization History  Administered Date(s) Administered   Fluad Quad(high Dose 65+) 12/04/2018, 12/28/2019, 04/13/2021, 04/16/2022   Influenza, High Dose Seasonal PF 03/07/2016, 03/26/2017, 03/31/2018   Influenza,inj,Quad PF,6+ Mos 03/07/2015   PFIZER(Purple Top)SARS-COV-2 Vaccination 05/07/2019, 05/28/2019, 12/14/2019   Pneumococcal Conjugate-13 02/28/2014   Pneumococcal Polysaccharide-23 08/02/2009   Td 04/13/2020   Tdap 07/28/2008   Zoster, Live 08/14/2010    TDAP status: Up to date  Flu Vaccine status: Due, Education has been provided regarding the importance of this vaccine. Advised may receive this vaccine at local pharmacy or Health Dept. Aware to provide a copy of the vaccination record if obtained from local pharmacy or Health Dept. Verbalized acceptance and understanding.  Pneumococcal vaccine status: Up to date  Covid-19 vaccine status: Declined, Education has been provided regarding the importance of this vaccine but patient still declined. Advised may receive this vaccine at local pharmacy or Health Dept.or vaccine clinic. Aware to provide a copy of the vaccination record if obtained from local pharmacy or Health Dept. Verbalized acceptance and understanding.  Qualifies for Shingles Vaccine? Yes   Zostavax completed Yes   Shingrix Completed?: No.    Education has been provided regarding the importance of this vaccine. Patient has been advised to call insurance company to determine out of pocket expense if they have not yet received this vaccine. Advised may also receive vaccine at local pharmacy or Health Dept. Verbalized acceptance and understanding.  Screening Tests Health Maintenance  Topic Date Due   Zoster Vaccines- Shingrix (1 of 2) 02/25/1965   COVID-19 Vaccine (4 - 2023-24 season) 11/17/2022   INFLUENZA VACCINE  06/16/2023 (Originally 10/17/2022)   Medicare Annual Wellness (AWV)  11/19/2023   DTaP/Tdap/Td (3 - Td or Tdap)  04/13/2030   Hepatitis C Screening  Completed   HPV VACCINES  Aged Out   Pneumonia Vaccine 59+ Years old  Discontinued   Colonoscopy  Discontinued    Health Maintenance  Health Maintenance Due  Topic Date Due   Zoster Vaccines- Shingrix (1 of 2) 02/25/1965   COVID-19 Vaccine (4 - 2023-24 season) 11/17/2022    Colorectal cancer screening: No longer required.   Lung Cancer Screening: (Low Dose CT Chest recommended if Age 25-80 years, 20 pack-year currently smoking OR have quit w/in 15years.) does not qualify.   Lung Cancer Screening Referral: n/a  Additional Screening:  Hepatitis C Screening: does not qualify; Completed 09/24/17  Vision Screening: Recommended annual ophthalmology exams for early detection of glaucoma and other disorders of the eye.  Dental Screening: Recommended annual dental exams for proper oral hygiene    Community Resource Referral / Chronic Care Management: CRR required this visit?  No   CCM required this visit?  No     Plan:     I have personally reviewed and noted the following in the patient's chart:   Medical and social history Use of alcohol, tobacco or illicit drugs  Current medications and supplements including opioid prescriptions. Patient is not currently taking opioid prescriptions. Functional ability and status Nutritional status Physical activity Advanced directives List of other physicians Hospitalizations, surgeries, and ER visits in previous 12 months Vitals Screenings to include cognitive, depression, and falls Referrals and appointments  In addition, I have reviewed and discussed with patient certain preventive protocols, quality metrics, and best practice recommendations. A written personalized care plan for preventive services as well as general preventive health recommendations were provided to patient.     Tora Kindred, CMA   11/19/2022   After Visit Summary: via MyChart  Nurse Notes:  Will get flu shot in the  fall.

## 2022-11-19 NOTE — Patient Instructions (Addendum)
Mr. Worthman , Thank you for taking time to come for your Medicare Wellness Visit. I appreciate your ongoing commitment to your health goals. Please review the following plan we discussed and let me know if I can assist you in the future.   Referrals/Orders/Follow-Ups/Clinician Recommendations: Get your flu shot this fall.  This is a list of the screening recommended for you and due dates:  Health Maintenance  Topic Date Due   Zoster (Shingles) Vaccine (1 of 2) 02/25/1965   COVID-19 Vaccine (4 - 2023-24 season) 11/17/2022   Flu Shot  06/16/2023*   Medicare Annual Wellness Visit  11/19/2023   DTaP/Tdap/Td vaccine (3 - Td or Tdap) 04/13/2030   Hepatitis C Screening  Completed   HPV Vaccine  Aged Out   Pneumonia Vaccine  Discontinued   Colon Cancer Screening  Discontinued  *Topic was postponed. The date shown is not the original due date.    Advanced directives: (Copy Requested) Please bring a copy of your health care power of attorney and living will to the office to be added to your chart at your convenience.  Next Medicare Annual Wellness Visit scheduled for next year: Yes, 11/25/23 @ 8:15am

## 2022-11-20 ENCOUNTER — Ambulatory Visit (INDEPENDENT_AMBULATORY_CARE_PROVIDER_SITE_OTHER): Payer: PPO

## 2022-11-20 ENCOUNTER — Ambulatory Visit
Admission: RE | Admit: 2022-11-20 | Discharge: 2022-11-20 | Disposition: A | Discharge status: Home / Self Care | Payer: PPO | Source: Ambulatory Visit | Attending: Emergency Medicine | Admitting: Emergency Medicine

## 2022-11-20 VITALS — BP 165/90 | HR 65 | Temp 98.2°F | Resp 18

## 2022-11-20 DIAGNOSIS — M799 Soft tissue disorder, unspecified: Secondary | ICD-10-CM | POA: Diagnosis not present

## 2022-11-20 DIAGNOSIS — M25532 Pain in left wrist: Secondary | ICD-10-CM | POA: Diagnosis not present

## 2022-11-20 DIAGNOSIS — S60212A Contusion of left wrist, initial encounter: Secondary | ICD-10-CM

## 2022-11-20 NOTE — ED Provider Notes (Signed)
[name] [name]    CSN: [id number] Arrival date & time: 11/20/22  1054      History   Chief Complaint Chief Complaint  Patient presents with   Wrist Pain    Took a hard hit to my back left hand/rist area. Have minor swelling to back of hand and a large hard knot located at back center of hand near rist. - Entered by patient    HPI [name] [name] is a 77 y.o. male.   77 year old male pt, [name] [name], presents to urgent care for evaluation of left wrist pain. Pt states he was hit with raquetball 6 days ago and has had swelling and pain since. Used ice initially.   Pt takes Eliquis   The history is provided by the patient. No language interpreter was used.    Past Medical History:  Diagnosis Date   A-fib [facility])    Atrial fibrillation with RVR (HCC)    Dupuytren's contracture    Essential hypertension 09/05/2014   Hypertension    Hypokalemia    Hypothyroidism    Malignant neoplasm of prostate (HCC) 09/05/2014   prostate,skin of the scalp   Mitral regurgitation    a. echo 03/2015: echo 03/2015: EF 50-55%, no RWMA, no sufficienct to allow for LV diastolic fxn, mild MR, LA mildly dilated at 46 mm, RV systolic fxn nl, PASP nl   Nephrolithiasis    New onset atrial fibrillation (HCC) 04/18/2015   OSA (obstructive sleep apnea) 05/11/2015   Persistent atrial fibrillation [facility])     Patient Active Problem List   Diagnosis Date Noted   Hematoma of left wrist 11/20/2022   Left wrist pain 11/20/2022   BPH associated with nocturia 11/08/2020   Other thrombophilia (HCC) 11/04/2020   CKD (chronic kidney disease), stage III (HCC) 11/04/2020   Aortic atherosclerosis (HCC) 11/03/2020   Thrombocytopenia (HCC) 10/12/2020   Asymptomatic microscopic hematuria 10/11/2020   History of prostate cancer 04/13/2020   BMI 29.0-29.9,adult 10/05/2019   Hyperlipidemia LDL goal <70 10/05/2019   Advanced care planning/counseling discussion 09/12/2016   OSA (obstructive sleep apnea)  05/11/2015   Atrial fibrillation with RVR (HCC)    Essential hypertension 09/05/2014   Dupuytren's contracture 09/05/2014   Hypothyroidism 09/05/2014    Past Surgical History:  Procedure Laterality Date   COLONOSCOPY WITH PROPOFOL N/A 04/18/2015   Procedure: COLONOSCOPY WITH PROPOFOL;  Surgeon: Midge Minium, MD;  Location: [facility] ENDOSCOPY;  Service: Endoscopy;  Laterality: N/A;   ELECTROPHYSIOLOGIC STUDY N/A 05/05/2015   Procedure: CARDIOVERSION;  Surgeon: Iran Ouch, MD;  Location: [facility] ORS;  Service: Cardiovascular;  Laterality: N/A;   RADIOACTIVE SEED IMPLANT N/A        Home Medications    Prior to Admission medications   Medication Sig Start Date End Date Taking? Authorizing Provider  amiodarone (PACERONE) 200 MG tablet Take 1 tablet by mouth once daily 11/06/22   Antonieta Iba, MD  apixaban (ELIQUIS) 5 MG TABS tablet Take 1 tablet by mouth twice daily 08/15/22   Antonieta Iba, MD  Cholecalciferol (VITAMIN D-3) 125 MCG (5000 UT) TABS Take 5,000 Units by mouth daily.    [provider]  finasteride (PROSCAR) 5 MG tablet Take 1 tablet (5 mg total) by mouth daily. 10/15/22   Cannady, Corrie Dandy T, NP  levothyroxine (SYNTHROID) 75 MCG tablet Take 1 tablet (75 mcg total) by mouth daily. 10/15/22   Cannady, Corrie Dandy T, NP  metoprolol tartrate (LOPRESSOR) 50 MG tablet Take 1 tablet by mouth  twice daily 10/23/22   Antonieta Iba, MD  Multiple Vitamin (MULTIVITAMIN) tablet Take 1 tablet by mouth daily.    [provider]  olmesartan (BENICAR) 40 MG tablet Take 1 tablet (40 mg total) by mouth daily. 10/15/22   Cannady, Corrie Dandy T, NP  Zinc 50 MG TABS Take 50 mg by mouth daily.    [provider]    Family History Family History  Problem Relation Age of Onset   Heart attack Mother    Heart disease Father    Arthritis Maternal Grandmother     Social History Social History   Tobacco Use   Smoking status: Former    Current packs/day: 0.00    Average  packs/day: 1 pack/day for 30.5 years (30.5 ttl pk-yrs)    Types: Cigarettes    Start date: 1966    Quit date: 09/05/1994    Years since quitting: 28.2   Smokeless tobacco: Former    Types: Chew    Quit date: 09/04/2004  Vaping Use   Vaping status: Never Used  Substance Use Topics   Alcohol use: No   Drug use: No     Allergies   Patient has no known allergies.   Review of Systems Review of Systems  Musculoskeletal:  Positive for joint swelling and myalgias.  Skin:  Positive for color change and wound.  All other systems reviewed and are negative.    Physical Exam Triage Vital Signs ED Triage Vitals  Encounter Vitals Group     BP 11/20/22 1112 (!) 165/90     Systolic BP Percentile --      Diastolic BP Percentile --      Pulse Rate 11/20/22 1112 65     Resp 11/20/22 1112 18     Temp 11/20/22 1112 98.2 F (36.8 C)     Temp src --      SpO2 11/20/22 1112 98 %     Weight --      Height --      Head Circumference --      Peak Flow --      Pain Score 11/20/22 1111 1     Pain Loc --      Pain Education --      Exclude from Growth Chart --    No data found.  Updated Vital Signs BP (!) 165/90   Pulse 65   Temp 98.2 F (36.8 C)   Resp 18   SpO2 98%   Visual Acuity Right Eye Distance:   Left Eye Distance:   Bilateral Distance:    Right Eye Near:   Left Eye Near:    Bilateral Near:     Physical Exam Vitals and nursing note reviewed.  Musculoskeletal:     Left wrist: Swelling, tenderness and bony tenderness present. Normal range of motion. Normal pulse.     Comments: Left mid wrist pain with hematoma noted to dorsal aspect, radial +2.   Neurological:     General: No focal deficit present.     Mental Status: He is alert and oriented to person, place, and time.     GCS: GCS eye subscore is 4. GCS verbal subscore is 5. GCS motor subscore is 6.  Psychiatric:        Attention and Perception: Attention normal.        Mood and Affect: Mood normal.         Speech: Speech normal.        Behavior: Behavior normal.  UC Treatments / Results  Labs (all labs ordered are listed, but only abnormal results are displayed) Labs Reviewed - No data to display  EKG   Radiology DG Wrist Complete Left  Result Date: 11/20/2022 CLINICAL DATA:  Injury, hit with a racquetball. On Eliquis. Left wrist pain for 6 days. EXAM: LEFT WRIST - COMPLETE 3+ VIEW COMPARISON:  None Available. FINDINGS: There is no evidence of fracture or dislocation. There is no evidence of arthropathy or other focal bone abnormality. Generalized soft tissue edema, with more focal rounded soft tissue prominence overlying the dorsal carpals, likely hematoma. IMPRESSION: 1. No fracture or dislocation of the left wrist. 2. Generalized soft tissue edema with more focal rounded soft tissue prominence overlying the dorsal carpals, likely hematoma. Electronically Signed   By: Narda Rutherford M.D.   On: 11/20/2022 12:08    Procedures Procedures (including critical care time)  Medications Ordered in UC Medications - No data to display  Initial Impression / Assessment and Plan / UC Course  I have reviewed the triage vital signs and the nursing notes.  Pertinent labs & imaging results that were available during my care of the patient were reviewed by me and considered in my medical decision making (see chart for details).  Clinical Course as of 11/20/22 1353  Wed Nov 20, 2022  1132 Left wrist xray ordered d/t injury [JD]    Clinical Course User Index [JD] Randye Treichler, Para March, NP    Left wrist pain, hematoma, sprain Final Clinical Impressions(s) / UC Diagnoses   Final diagnoses:  Left wrist pain  Hematoma of left wrist     Discharge Instructions      Your wrist xray is negative. Rest,ice,elevate, follow up with PCP. Return as needed.     ED Prescriptions   None    PDMP not reviewed this encounter.   Clancy Gourd, NP 11/20/22 1353

## 2022-11-20 NOTE — ED Triage Notes (Signed)
Patient to Urgent Care with complaints of  left sided hand/ wrist pain.  Reports he was hit with a racquet ball racket 6 days ago. Reports swelling has improved but he still has a large hematoma/ area of swelling present. Still has full range of motion and able to move fingers.   Takes eliquis.

## 2022-11-20 NOTE — Discharge Instructions (Signed)
Your wrist xray is negative. Rest,ice,elevate, follow up with PCP. Return as needed.

## 2022-11-25 DIAGNOSIS — X32XXXA Exposure to sunlight, initial encounter: Secondary | ICD-10-CM | POA: Diagnosis not present

## 2022-11-25 DIAGNOSIS — D2262 Melanocytic nevi of left upper limb, including shoulder: Secondary | ICD-10-CM | POA: Diagnosis not present

## 2022-11-25 DIAGNOSIS — L570 Actinic keratosis: Secondary | ICD-10-CM | POA: Diagnosis not present

## 2022-11-25 DIAGNOSIS — D2272 Melanocytic nevi of left lower limb, including hip: Secondary | ICD-10-CM | POA: Diagnosis not present

## 2022-11-25 DIAGNOSIS — D2271 Melanocytic nevi of right lower limb, including hip: Secondary | ICD-10-CM | POA: Diagnosis not present

## 2022-11-25 DIAGNOSIS — C44629 Squamous cell carcinoma of skin of left upper limb, including shoulder: Secondary | ICD-10-CM | POA: Diagnosis not present

## 2022-11-25 DIAGNOSIS — D225 Melanocytic nevi of trunk: Secondary | ICD-10-CM | POA: Diagnosis not present

## 2022-11-25 DIAGNOSIS — C44222 Squamous cell carcinoma of skin of right ear and external auricular canal: Secondary | ICD-10-CM | POA: Diagnosis not present

## 2022-11-25 DIAGNOSIS — Z85828 Personal history of other malignant neoplasm of skin: Secondary | ICD-10-CM | POA: Diagnosis not present

## 2022-11-25 DIAGNOSIS — C44319 Basal cell carcinoma of skin of other parts of face: Secondary | ICD-10-CM | POA: Diagnosis not present

## 2022-11-25 DIAGNOSIS — D2261 Melanocytic nevi of right upper limb, including shoulder: Secondary | ICD-10-CM | POA: Diagnosis not present

## 2022-11-25 DIAGNOSIS — D485 Neoplasm of uncertain behavior of skin: Secondary | ICD-10-CM | POA: Diagnosis not present

## 2022-11-28 ENCOUNTER — Ambulatory Visit (INDEPENDENT_AMBULATORY_CARE_PROVIDER_SITE_OTHER): Payer: PPO

## 2022-11-28 ENCOUNTER — Other Ambulatory Visit: Payer: PPO

## 2022-11-28 DIAGNOSIS — D696 Thrombocytopenia, unspecified: Secondary | ICD-10-CM | POA: Diagnosis not present

## 2022-11-28 DIAGNOSIS — Z23 Encounter for immunization: Secondary | ICD-10-CM | POA: Diagnosis not present

## 2022-11-29 LAB — CBC WITH DIFFERENTIAL/PLATELET
Basophils Absolute: 0.1 10*3/uL (ref 0.0–0.2)
Basos: 1 %
EOS (ABSOLUTE): 0.2 10*3/uL (ref 0.0–0.4)
Eos: 3 %
Hematocrit: 41.4 % (ref 37.5–51.0)
Hemoglobin: 13.3 g/dL (ref 13.0–17.7)
Immature Grans (Abs): 0 10*3/uL (ref 0.0–0.1)
Immature Granulocytes: 1 %
Lymphocytes Absolute: 1.6 10*3/uL (ref 0.7–3.1)
Lymphs: 28 %
MCH: 32.1 pg (ref 26.6–33.0)
MCHC: 32.1 g/dL (ref 31.5–35.7)
MCV: 100 fL — ABNORMAL HIGH (ref 79–97)
Monocytes Absolute: 1 10*3/uL — ABNORMAL HIGH (ref 0.1–0.9)
Monocytes: 17 %
Neutrophils Absolute: 2.8 10*3/uL (ref 1.4–7.0)
Neutrophils: 50 %
Platelets: 133 10*3/uL — ABNORMAL LOW (ref 150–450)
RBC: 4.14 x10E6/uL (ref 4.14–5.80)
RDW: 14.6 % (ref 11.6–15.4)
WBC: 5.7 10*3/uL (ref 3.4–10.8)

## 2022-11-29 NOTE — Progress Notes (Signed)
Contacted via MyChart   Good morning Kalim, your labs have returned and platelet count is now trending back up.  Still low, but better then last check.  We will continue to monitor closely.  Any questions? Keep being amazing!!  Thank you for allowing me to participate in your care.  I appreciate you. Kindest regards, Kijuan Gallicchio

## 2022-12-03 DIAGNOSIS — D0421 Carcinoma in situ of skin of right ear and external auricular canal: Secondary | ICD-10-CM | POA: Diagnosis not present

## 2022-12-03 DIAGNOSIS — C44222 Squamous cell carcinoma of skin of right ear and external auricular canal: Secondary | ICD-10-CM | POA: Diagnosis not present

## 2022-12-17 DIAGNOSIS — M720 Palmar fascial fibromatosis [Dupuytren]: Secondary | ICD-10-CM | POA: Diagnosis not present

## 2022-12-23 ENCOUNTER — Ambulatory Visit: Payer: PPO | Admitting: Physician Assistant

## 2022-12-23 ENCOUNTER — Ambulatory Visit: Payer: Self-pay | Admitting: *Deleted

## 2022-12-23 ENCOUNTER — Telehealth: Payer: Self-pay

## 2022-12-23 VITALS — BP 186/80 | HR 73 | Ht 73.0 in | Wt 225.0 lb

## 2022-12-23 DIAGNOSIS — R310 Gross hematuria: Secondary | ICD-10-CM | POA: Diagnosis not present

## 2022-12-23 DIAGNOSIS — R339 Retention of urine, unspecified: Secondary | ICD-10-CM | POA: Diagnosis not present

## 2022-12-23 DIAGNOSIS — R338 Other retention of urine: Secondary | ICD-10-CM

## 2022-12-23 DIAGNOSIS — C44629 Squamous cell carcinoma of skin of left upper limb, including shoulder: Secondary | ICD-10-CM | POA: Diagnosis not present

## 2022-12-23 LAB — BLADDER SCAN AMB NON-IMAGING: Scan Result: 507

## 2022-12-23 NOTE — Telephone Encounter (Signed)
  Chief Complaint: blood in urine. No urination since last night . Patient reports contacting [facility] urology and will be seen there today  Symptoms: blood in urine unable to urinate since last night. Hx takes eliquis . Reported blood clots in urine last 2 weeks  Frequency: na  Pertinent Negatives: Patient denies severe pain  Disposition: [] ED /[] Urgent Care (no appt availability in office) / [] Appointment(In office/virtual)/ []  [place] [facility]/ [] Home Care/ [] Refused Recommended Disposition /[] [place] [facility]/ [x]  Follow-up with PCP Additional Notes:   Patient has appt with [facility] urology today . This NT recommended if sx worsen go to ED . Patient verbalized understanding.   Summary: blood in urine   Patient called stated he is having problems urinating and has not gone to the bathroom since yesterday evening. When he went yesterday there was dark blood in his urine. He stated he has passed blood clots in his urine for about 2 weeks and last night was the first time he was unable to make water. Please f/u with patient             Reason for Disposition  [1] Unable to urinate (or only a few drops) > 4 hours AND [2] bladder feels very full (e.g., palpable bladder or strong urge to urinate)  Answer Assessment - Initial Assessment Questions 1. COLOR of URINE: "Describe the color of the urine."  (e.g., tea-colored, pink, red, bloody) "Do you have blood clots in your urine?" (e.g., none, pea, grape, small coin)     Dark colored urine . [facility] urology and will see them today  2. ONSET: "When did the bleeding start?"      na 3. EPISODES: "How many times has there been blood in the urine?" or "How many times today?"     na 4. PAIN with URINATION: "Is there any pain with passing your urine?" If Yes, ask: "How bad is the pain?"  (Scale 1-10; or mild, moderate, severe)    - MILD: Complains slightly about urination hurting.    - MODERATE: Interferes with normal  activities.      - SEVERE: Excruciating, unwilling or unable to urinate because of the pain.      na 5. FEVER: "Do you have a fever?" If Yes, ask: "What is your temperature, how was it measured, and when did it start?"     na 6. ASSOCIATED SYMPTOMS: "Are you passing urine more frequently than usual?"     na 7. OTHER SYMPTOMS: "Do you have any other symptoms?" (e.g., back/flank pain, abdomen pain, vomiting)     na 8. PREGNANCY: "Is there any chance you are pregnant?" "When was your last menstrual period?"     na  Protocols used: Urine - Blood In-A-AH

## 2022-12-23 NOTE — Telephone Encounter (Signed)
noted 

## 2022-12-23 NOTE — Progress Notes (Unsigned)
12/23/2022 10:31 AM   [name] [name] 04/23/1945 [id number]  CC: Chief Complaint  Patient presents with   Urinary Retention   HPI: [name] [name] is a 77 y.o. 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   Today he reports the inability to void x12 hours in the setting of several days of gross hematuria and passage of small clots. He stopped Eliquis about 24 hours ago.  Last CTU dated 11/02/2020 notable for a nonobstructing left renal stone, no other significant urologic findings.  In-office UA today with brown color, unable to read due to color interference; urine microscopy with >30 RBCs/HPF and moderate bacteria. PVR .  PMH: Past Medical History:  Diagnosis Date   A-fib [facility])    Atrial fibrillation with RVR (HCC)    Dupuytren's contracture    Essential hypertension 09/05/2014   Hypertension    Hypokalemia    Hypothyroidism    Malignant neoplasm of prostate (HCC) 09/05/2014   prostate,skin of the scalp   Mitral regurgitation    a. echo 03/2015: echo 03/2015: EF 50-55%, no RWMA, no sufficienct to allow for LV diastolic fxn, mild MR, LA mildly dilated at 46 mm, RV systolic fxn nl, PASP nl   Nephrolithiasis    New onset atrial fibrillation (HCC) 04/18/2015   OSA (obstructive sleep apnea) 05/11/2015   Persistent atrial fibrillation (HCC)     Surgical History: Past Surgical History:  Procedure Laterality Date   COLONOSCOPY WITH PROPOFOL N/A 04/18/2015   Procedure: COLONOSCOPY WITH PROPOFOL;  Surgeon: Midge Minium, MD;  Location: [facility] ENDOSCOPY;  Service: Endoscopy;  Laterality: N/A;   ELECTROPHYSIOLOGIC STUDY N/A 05/05/2015   Procedure: CARDIOVERSION;  Surgeon: Iran Ouch, MD;  Location: [facility] ORS;  Service: Cardiovascular;  Laterality: N/A;   RADIOACTIVE SEED IMPLANT N/A     Home  Medications:  Allergies as of 12/23/2022   No Known Allergies      Medication List        Accurate as of December 23, 2022 10:31 AM. If you have any questions, ask your nurse or doctor.          amiodarone 200 MG tablet Commonly known as: PACERONE Take 1 tablet by mouth once daily   Eliquis 5 MG Tabs tablet Generic drug: apixaban Take 1 tablet by mouth twice daily   finasteride 5 MG tablet Commonly known as: PROSCAR Take 1 tablet (5 mg total) by mouth daily.   levothyroxine 75 MCG tablet Commonly known as: SYNTHROID Take 1 tablet (75 mcg total) by mouth daily.   metoprolol tartrate 50 MG tablet Commonly known as: LOPRESSOR Take 1 tablet by mouth twice daily   multivitamin tablet Take 1 tablet by mouth daily.   olmesartan 40 MG tablet Commonly known as: BENICAR Take 1 tablet (40 mg total) by mouth daily.   Vitamin D-3 125 MCG (5000 UT) Tabs Take 5,000 Units by mouth daily.   Zinc 50 MG Tabs Take 50 mg by mouth daily.        Allergies:  No Known Allergies  Family History: Family History  Problem Relation Age of Onset   Heart attack Mother    Heart disease Father    Arthritis Maternal Grandmother     Social History:   reports that he quit smoking about 28 years ago. His smoking use included cigarettes. He started smoking about  58 years ago. He has a 30.5 pack-year smoking history. He quit smokeless tobacco use about 18 years ago.  His smokeless tobacco use included chew. He reports that he does not drink alcohol and does not use drugs.  Physical Exam: BP (!) 186/80   Pulse 73   Ht 6\' 1"  (1.854 m)   Wt 225 lb (102.1 kg)   BMI 29.69 kg/m   Constitutional:  Alert and oriented, no acute distress, nontoxic appearing HEENT: [place], AT Cardiovascular: No clubbing, cyanosis, or edema Respiratory: Normal respiratory effort, no increased work of breathing Skin: No rashes, bruises or suspicious lesions Neurologic: Grossly intact, no focal deficits, moving all  4 extremities Psychiatric: Normal mood and affect  Laboratory Data: Results for orders placed or performed in visit on 12/23/22  Microscopic Examination   Urine  Result Value Ref Range   WBC, UA 0-5 0 - 5 /hpf   RBC, Urine >30 (A) 0 - 2 /hpf   Epithelial Cells (non renal) 0-10 0 - 10 /hpf   Bacteria, UA Moderate (A) None seen/Few  Urinalysis, Complete  Result Value Ref Range   Specific Gravity, UA CANCELED    pH, UA CANCELED    Color, UA Brown (A) Yellow   Appearance Ur Cloudy (A) Clear   Protein,UA CANCELED    Glucose, UA CANCELED    Ketones, UA CANCELED    Microscopic Examination See below:   Bladder Scan (Post Void Residual) in office  Result Value Ref Range   Scan Result 507 ml    Simple Catheter Placement  Due to urinary retention patient is present today for a foley cath placement.  Patient was cleaned and prepped in a sterile fashion with betadine and 2% lidocaine jelly was instilled into the urethra. A 22 FR coude silicone foley catheter was inserted, urine return was noted  , urine was maroon in color.  The balloon was filled with 10cc of sterile water.  Patient tolerated well, no complications were noted   Performed by: Carman Ching, PA-C   Bladder Irrigation  Due to clot retention patient is present today for a bladder irrigation. Patient was cleaned and prepped in a sterile fashion. 500 mL of sterile water was instilled into the bladder with a 70mL [place] syringe through the catheter in place.  of efflux was cleared from the bladder with evacuation of 5ccs of clot material. Efflux cleared from maroon to clear with the procedure and the catheter irrigated easily. Upon completion, the catheter was draining well. Patient tolerated well.   Performed by: Carman Ching, PA-C and Domingo Cocking, CMA  Fill and Pull Catheter Removal  Patient is present today for a catheter removal.  Patient was cleaned and prepped in a sterile fashion  of sterile water was instilled into the bladder when the patient felt the urge to urinate. 10ml of water was then drained from the balloon.  A 22FR coude silicone foley cath was removed from the bladder no complications were noted .  Patient was then given some time to void on their own.  Patient can void on their own after some time.  Patient tolerated well.  Performed by: Carman Ching, PA-C   Assessment & Plan:   1. Clot retention of urine Urinary retention in the setting of gross hematuria with clots consistent with clot retention. I placed a Foley catheter and irrigated his bladder in clinic today, see above. I was able to evacuate some clot from his bladder. Urine appeared brown-tone in  color consistent with old blood and urine cleared rapidly with clot evacuation. Patient tolerated well.  I recommended to keep his Foley in place for 2-3 days for bladder rest given stretch injury, but he elected for fill and pull voiding trial instead, which he passed. I recommended staying off Eliquis another 1-2 days to allow the urine to clear.  He is having clot retention despite finasteride x2 years, expect Eliquis is contributory. I do think prostate bleeding is the most likely etiology, will defer repeat CTU for now. I recommended cysto/TRUS with Dr. Richardo Hanks for consideration of HOLEP vs PAE and he agreed. Would recommend CTU for consideration of alternative sources of bleeding if no significant findings on cysto. - Bladder Scan (Post Void Residual) in office - Urinalysis, Complete - CULTURE, URINE COMPREHENSIVE   Return in about 2 weeks (around 01/06/2023) for Cysto/TRUS with Dr. Richardo Hanks.  Carman Ching, PA-C  [facility] [place] [address] [place], [place] [address] [phone]

## 2022-12-23 NOTE — Telephone Encounter (Signed)
Pt LM on triage line stating that he has been unable to void since yesterday evening and would like to be seen.  Called pt back he states that pain is currently 8/10, noticed that he started passing blood clots a few days ago, however now unable to pass anything. He states that he has been up all night drinking water in an attempt to void. Patient advised to come into clinic as soon as possible. Pt voiced understanding.

## 2022-12-23 NOTE — Patient Instructions (Signed)
Cystoscopy Cystoscopy is a procedure that is used to help diagnose and sometimes treat conditions that affect the lower urinary tract. The lower urinary tract includes the bladder and the urethra. The urethra is the tube that drains urine from the bladder. Cystoscopy is done using a thin, tube-shaped instrument with a light and camera at the end (cystoscope). The cystoscope may be hard or flexible, depending on the goal of the procedure. The cystoscope is inserted through the urethra, into the bladder. Cystoscopy may be recommended if you have: Urinary tract infections that keep coming back. Blood in the urine (hematuria). An inability to control when you urinate (urinary incontinence) or an overactive bladder. Unusual cells found in a urine sample. A blockage in the urethra, such as a urinary stone. Painful urination. An abnormality in the bladder found during an intravenous pyelogram (IVP) or CT scan. What are the risks? Generally, this is a safe procedure. However, problems may occur, including: Infection. Bleeding.  What happens during the procedure?  You will be given one or more of the following: A medicine to numb the area (local anesthetic). The area around the opening of your urethra will be cleaned. The cystoscope will be passed through your urethra into your bladder. Germ-free (sterile) fluid will flow through the cystoscope to fill your bladder. The fluid will stretch your bladder so that your health care provider can clearly examine your bladder walls. Your doctor will look at the urethra and bladder. The cystoscope will be removed The procedure may vary among health care providers  What can I expect after the procedure? After the procedure, it is common to have: Some soreness or pain in your urethra. Urinary symptoms. These include: Mild pain or burning when you urinate. Pain should stop within a few minutes after you urinate. This may last for up to a few days after the  procedure. A small amount of blood in your urine for several days. Feeling like you need to urinate but producing only a small amount of urine. Follow these instructions at home: General instructions Return to your normal activities as told by your health care provider.  Drink plenty of fluids after the procedure. Keep all follow-up visits as told by your health care provider. This is important. Contact a health care provider if you: Have pain that gets worse or does not get better with medicine, especially pain when you urinate lasting longer than 72 hours after the procedure. Have trouble urinating. Get help right away if you: Have blood clots in your urine. Have a fever or chills. Are unable to urinate. Summary Cystoscopy is a procedure that is used to help diagnose and sometimes treat conditions that affect the lower urinary tract. Cystoscopy is done using a thin, tube-shaped instrument with a light and camera at the end. After the procedure, it is common to have some soreness or pain in your urethra. It is normal to have blood in your urine after the procedure.  If you were prescribed an antibiotic medicine, take it as told by your health care provider.  This information is not intended to replace advice given to you by your health care provider. Make sure you discuss any questions you have with your health care provider. Document Revised: 02/24/2018 Document Reviewed: 02/24/2018 Elsevier Patient Education  2020 Elsevier Inc.   Transrectal Ultrasound  A transrectal ultrasound is a procedure that uses sound waves to create images of the prostate gland and nearby tissues. For this procedure, an ultrasound probe is placed  in the rectum. The probe sends sound waves through the wall of the rectum into the prostate gland. The prostate is a walnut-sized gland that is located below the bladder and in front of the rectum. The images show the size and shape of the prostate gland and nearby  structures. You may need this test if you have: Trouble urinating. Trouble getting your partner pregnant (infertility). An abnormal result from a prostate screening exam. Tell a health care provider about: Any allergies you have. All medicines you are taking, including vitamins, herbs, eye drops, creams, and over-the-counter medicines. Any bleeding problems you have. Any surgeries you have had. Any medical conditions you have. Any prostate infections you have had. What are the risks? Generally, this is a safe procedure. However, problems may occur, including: Discomfort during the procedure. Blood in your urine or sperm after the procedure. This may occur if a sample of tissue is taken to look at under a microscope (biopsy) during the procedure. What happens before the procedure? Your health care provider may instruct you to use an enema 1-4 hours before the procedure. Follow instructions from your health care provider about how to do the enema. Ask your health care provider about: Changing or stopping your regular medicines. This is especially important if you are taking diabetes medicines or blood thinners. Taking medicines such as aspirin and ibuprofen. These medicines can thin your blood. Do not take these medicines unless your health care provider tells you to take them. Taking over-the-counter medicines, vitamins, herbs, and supplements. What happens during the procedure? You will be asked to lie down on your left side on an exam table. You will bend your knees toward your chest. Gel will be put on a small probe that is about the width of a finger. The probe will be gently inserted into your rectum. You may have a feeling of fullness but should not feel pain. The probe will send signals to a computer that will create images. These will be displayed on a monitor that looks like a small television screen. The technician will slightly rotate the probe throughout the procedure. While  rotating the probe, he or she will view and capture images of the prostate gland and the surrounding structures from different angles. Your health care provider may take a biopsy sample of prostate tissue during the procedure. The images captured from the ultrasound will help guide the needle that is used to remove a sample of tissue. The sample will be sent to a lab for testing. The probe will be removed. The procedure may vary among health care providers and hospitals. What can I expect after the procedure? It is up to you to get the results of your procedure. Ask your health care provider, or the department that is doing the procedure, when your results will be ready. Keep all follow-up visits. This is important. Summary A transrectal ultrasound is a procedure that uses sound waves to create images of the prostate gland and nearby tissues. The images show the size and shape of the prostate gland and nearby structures. Before the procedure, ask your health care provider about changing or stopping your regular medicines. This is especially important if you are taking diabetes medicines or blood thinners. This information is not intended to replace advice given to you by your health care provider. Make sure you discuss any questions you have with your health care provider. Document Revised: 11/15/2020 Document Reviewed: 08/28/2020 Elsevier Patient Education  2024 [employer].

## 2022-12-24 LAB — URINALYSIS, COMPLETE

## 2022-12-24 LAB — MICROSCOPIC EXAMINATION: RBC, Urine: >30 /[HPF] — AB (ref 0–2)

## 2022-12-26 LAB — CULTURE, URINE COMPREHENSIVE

## 2023-01-06 ENCOUNTER — Telehealth: Payer: Self-pay | Admitting: Cardiovascular Disease

## 2023-01-06 DIAGNOSIS — I4891 Unspecified atrial fibrillation: Secondary | ICD-10-CM

## 2023-01-06 DIAGNOSIS — C44319 Basal cell carcinoma of skin of other parts of face: Secondary | ICD-10-CM | POA: Diagnosis not present

## 2023-01-06 NOTE — Telephone Encounter (Signed)
Spoke w/ pt  he reports that he has been experiencing blood clots in his urine since about the 3rd week in September. It kept him up all for about 2 days trying to pass a clot, was seen by urology and had cath placed, advised on 12/23/22:  "I recommended staying off Eliquis another 1-2 days to allow the urine to clear. " Pt reports that he had a similar problem last year, CT and cystoscopy were inconclusive, but thinks it's a problem w/ his prostate. Pt reports that he only has the clotting issue when he takes his Eliquis and it will clear up if he does not take for 3-4 days; symptoms will return when he resumes Eliquis. Pt has appt w/ urology, but could not get back in until 01/30/23. Pt reports that he feels fine otherwise, BP has been good, denies SOB and does not feel like he has been in afib. Pt states that his BP machine has not shown afib - discussed w/ him that this will not show afib. Pt would like Dr. Windell Hummingbird input and recommendation. Advised him that I will make Dr. Mariah Milling aware of his concerns and call him back.

## 2023-01-06 NOTE — Telephone Encounter (Signed)
  Pt c/o medication issue:  1. Name of Medication: apixaban (ELIQUIS) 5 MG TABS tablet   2. How are you currently taking this medication (dosage and times per day)? Take 1 tablet by mouth twice daily   3. Are you having a reaction (difficulty breathing--STAT)? No   4. What is your medication issue? Pt said, he is having blood and blood clot in his urine, he is scheduled to see his urology on 01/30/23 to see if its from his prostate. However, he stopped taking his eliquis and the bleeding stop as well. He said he has not been taking his eliquis since 12/30/22

## 2023-01-07 DIAGNOSIS — M720 Palmar fascial fibromatosis [Dupuytren]: Secondary | ICD-10-CM | POA: Diagnosis not present

## 2023-01-09 NOTE — Telephone Encounter (Signed)
Called patient and left message for call back.

## 2023-01-09 NOTE — Telephone Encounter (Signed)
Pt returning call

## 2023-01-09 NOTE — Telephone Encounter (Signed)
Called and spoke with patient. Informed him of the following recommendations from Dr. Mariah Milling.  I would hold the Eliquis until he has a chance to see urology  Can we place a referral to [facility] for consideration of Watchman device  Long history of hematuria, worse on Eliquis  Thx  TGollan   Patient verbalizes understanding. Order placed for referral.

## 2023-01-20 ENCOUNTER — Other Ambulatory Visit: Payer: Self-pay | Admitting: Cardiovascular Disease

## 2023-01-30 ENCOUNTER — Encounter: Payer: Self-pay | Admitting: Urology

## 2023-01-30 ENCOUNTER — Ambulatory Visit: Payer: PPO | Admitting: Urology

## 2023-01-30 VITALS — BP 159/92 | HR 69 | Ht 73.0 in | Wt 225.0 lb

## 2023-01-30 DIAGNOSIS — R310 Gross hematuria: Secondary | ICD-10-CM | POA: Diagnosis not present

## 2023-01-30 DIAGNOSIS — Z792 Long term (current) use of antibiotics: Secondary | ICD-10-CM | POA: Diagnosis not present

## 2023-01-30 DIAGNOSIS — Z8546 Personal history of malignant neoplasm of prostate: Secondary | ICD-10-CM

## 2023-01-30 DIAGNOSIS — R3129 Other microscopic hematuria: Secondary | ICD-10-CM | POA: Diagnosis not present

## 2023-01-30 MED ORDER — CEPHALEXIN 250 MG PO CAPS
500.0000 mg | ORAL_CAPSULE | Freq: Once | ORAL | Status: AC
Start: 2023-01-30 — End: 2023-01-30
  Administered 2023-01-30: 500 mg via ORAL

## 2023-01-30 NOTE — Progress Notes (Signed)
   01/30/2023 4:44 PM   [name] [name] 11-11-1945 [id number]  Reason for visit: Gross hematuria, history of prostate cancer  HPI: 77 year old male with history of low risk prostate cancer in 2006 treated with brachytherapy, PSA has remained undetectable since that time who has had recurrent episodes of gross hematuria over the last 7 to 8 years.  He underwent a negative gross hematuria workup with Dr. 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  He had some improvement on the finasteride, but had recurrence of significant gross hematuria in 2022, I saw him at that time and recommended cystoscopy but he deferred.  CT urogram at that time showed no etiology of gross hematuria.    He is on Eliquis for history of atrial fibrillation.  Most recently he developed significant gross hematuria and clot retention on October 2024 and a Foley was placed by Carman Ching, PA and bladder was irrigated and Foley removed.  He is continue to have intermittent gross hematuria and clots since that time, he has been holding Eliquis.  Cystoscopy procedure: Informed consent obtained and patient prepped and draped in standard sterile fashion.  Keflex given for prophylaxis.  The flexible cystoscope was inserted into the urethra and a normal-appearing urethra followed proximally into the bladder.  The prostate was small with some erythematous tissue within the prostatic fossa as well as at the bladder neck.  Thorough inspection of the bladder showed no bladder abnormalities, retroflexion again showed some hypervascular areas at the bladder neck and prostate.  Cytology sent.  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  He has failed trial of medications with finasteride and continues to have gross hematuria.  Currently holding anticoagulation and cardiology is considering a Watchman device.  He is interested in any potential options to resolve his recurrent gross hematuria and clot retention.  Challenging scenario with his history of radiation, I think any outlet procedure like HOLEP would have a higher risk of incontinence, and he denies any urinary symptoms aside from the gross hematuria.  We discussed hyperbaric oxygen treatment as an option, as well as PAE(prostate artery embolization) with interventional radiology.  He was interested in meeting with interventional radiology to consider embolization, I think this is a very reasonable first step with lower side effect profile than potential HOLEP with his history of radiation.  Referral placed to IR to consider PAE  Repeat CT urogram ordered Follow-up cytology    Sondra Come, MD  [facility] [address], [address] [place], [place] [address] [phone]

## 2023-01-30 NOTE — Patient Instructions (Signed)
We placed a referral to interventional radiology to consider a PAE (prostate artery embolization) that can help with your recurrent bleeding from the prostate.

## 2023-02-03 ENCOUNTER — Other Ambulatory Visit: Payer: Self-pay | Admitting: Cardiovascular Disease

## 2023-02-07 ENCOUNTER — Other Ambulatory Visit: Payer: Self-pay | Admitting: Urology

## 2023-02-07 DIAGNOSIS — R310 Gross hematuria: Secondary | ICD-10-CM

## 2023-02-11 ENCOUNTER — Ambulatory Visit
Admission: RE | Admit: 2023-02-11 | Discharge: 2023-02-11 | Disposition: A | Discharge status: Home / Self Care | Payer: PPO | Source: Ambulatory Visit | Attending: Urology | Admitting: Urology

## 2023-02-11 DIAGNOSIS — K5730 Diverticulosis of large intestine without perforation or abscess without bleeding: Secondary | ICD-10-CM | POA: Diagnosis not present

## 2023-02-11 DIAGNOSIS — K4090 Unilateral inguinal hernia, without obstruction or gangrene, not specified as recurrent: Secondary | ICD-10-CM | POA: Diagnosis not present

## 2023-02-11 DIAGNOSIS — R310 Gross hematuria: Secondary | ICD-10-CM | POA: Diagnosis not present

## 2023-02-11 DIAGNOSIS — N200 Calculus of kidney: Secondary | ICD-10-CM | POA: Diagnosis not present

## 2023-02-11 LAB — POCT I-STAT CREATININE: Creatinine, Ser: 1.4 mg/dL — ABNORMAL HIGH (ref 0.61–1.24)

## 2023-02-11 MED ORDER — IOHEXOL 300 MG/ML  SOLN
100.0000 mL | Freq: Once | INTRAMUSCULAR | Status: AC | PRN
  Administered 2023-02-11: 100 mL via INTRAVENOUS

## 2023-02-18 ENCOUNTER — Ambulatory Visit
Admission: RE | Admit: 2023-02-18 | Discharge: 2023-02-18 | Disposition: A | Discharge status: Home / Self Care | Payer: PPO | Source: Ambulatory Visit | Attending: Urology

## 2023-02-18 DIAGNOSIS — R310 Gross hematuria: Secondary | ICD-10-CM

## 2023-02-18 DIAGNOSIS — Z8546 Personal history of malignant neoplasm of prostate: Secondary | ICD-10-CM | POA: Diagnosis not present

## 2023-02-18 HISTORY — PX: IR RADIOLOGIST EVAL & MGMT: IMG5224

## 2023-02-18 NOTE — Consult Note (Signed)
Chief Complaint: Patient was seen in consultation today for intermittent hematuria at the request of Sninsky,Brian C  Referring Physician(s): Sninsky,Brian C  History of Present Illness: [name] [name] is a 77 y.o. male With a history of low-grade prostate cancer successfully treated with brachytherapy.  His PSA has remained normal since the time of treatment.  Additionally, he has a history of atrial fibrillation for which she has been on Eliquis.  Over the past year he has had several episodes of intermittent hematuria including passage of small clots.  This past October he had an episode of more significant bleeding and clot formation which resulted in urinary retention requiring Foley catheter placement and bladder irrigation.  He has subsequently been seen by Dr. Signa Kell and had a cystoscopy which revealed some areas of hypervascularity within the bladder base and median lobe of the prostate gland.  Differential considerations include radiation cystoprostatitis versus hypervascular prostate tissue from focal benign prostatic hyperplasia.  He denies significant symptoms of lower urinary tract issues.  He scored only 5 out of 35 on the international prostate symptom score questionnaire.  On prior CT imaging, his prostate gland is quite small and is studied with brachytherapy seeds.  He has held his Eliquis since his last episode of urinary retention and has noticed a significant improvement in his intermittent hematuria.  He occasionally still passes some bloody urine and tiny clots but finds this to be very manageable.  He has plans to talk to his cardiologist this month about resuming Eliquis versus considering placement of a Watchman device.  Past Medical History:  Diagnosis Date   A-fib [facility])    Atrial fibrillation with RVR (HCC)    Dupuytren's contracture    Essential hypertension 09/05/2014   Hypertension    Hypokalemia    Hypothyroidism    Malignant neoplasm of  prostate (HCC) 09/05/2014   prostate,skin of the scalp   Mitral regurgitation    a. echo 03/2015: echo 03/2015: EF 50-55%, no RWMA, no sufficienct to allow for LV diastolic fxn, mild MR, LA mildly dilated at 46 mm, RV systolic fxn nl, PASP nl   Nephrolithiasis    New onset atrial fibrillation (HCC) 04/18/2015   OSA (obstructive sleep apnea) 05/11/2015   Persistent atrial fibrillation (HCC)     Past Surgical History:  Procedure Laterality Date   COLONOSCOPY WITH PROPOFOL N/A 04/18/2015   Procedure: COLONOSCOPY WITH PROPOFOL;  Surgeon: Midge Minium, MD;  Location: [facility] ENDOSCOPY;  Service: Endoscopy;  Laterality: N/A;   ELECTROPHYSIOLOGIC STUDY N/A 05/05/2015   Procedure: CARDIOVERSION;  Surgeon: Iran Ouch, MD;  Location: [facility] ORS;  Service: Cardiovascular;  Laterality: N/A;   IR RADIOLOGIST EVAL & MGMT  02/18/2023   RADIOACTIVE SEED IMPLANT N/A     Allergies: Patient has no known allergies.  Medications: Prior to Admission medications   Medication Sig Start Date End Date Taking? Authorizing Provider  amiodarone (PACERONE) 200 MG tablet Take 1 tablet by mouth once daily 02/03/23   Antonieta Iba, MD  apixaban (ELIQUIS) 5 MG TABS tablet Take 1 tablet by mouth twice daily 08/15/22   Antonieta Iba, MD  Cholecalciferol (VITAMIN D-3) 125 MCG (5000 UT) TABS Take 5,000 Units by mouth daily.    [provider]  finasteride (PROSCAR) 5 MG tablet Take 1 tablet (5 mg total) by mouth daily. 10/15/22   Cannady, Corrie Dandy T, NP  levothyroxine (SYNTHROID) 75 MCG tablet Take 1 tablet (75 mcg total) by mouth daily. 10/15/22   Aura Dials  T, NP  metoprolol tartrate (LOPRESSOR) 50 MG tablet Take 1 tablet by mouth twice daily 01/20/23   Antonieta Iba, MD  Multiple Vitamin (MULTIVITAMIN) tablet Take 1 tablet by mouth daily.    [provider]  olmesartan (BENICAR) 40 MG tablet Take 1 tablet (40 mg total) by mouth daily. 10/15/22   Cannady, Corrie Dandy T, NP  Zinc 50 MG TABS Take 50 mg  by mouth daily.    [provider]     Family History  Problem Relation Age of Onset   Heart attack Mother    Heart disease Father    Arthritis Maternal Grandmother     Social History   Socioeconomic History   Marital status: Married    Spouse name: Rene Kocher   Number of children: 2   Years of education: Not on file   Highest education level: Associate degree: academic program  Occupational History   Occupation: retired     Comment: Acupuncturist  Tobacco Use   Smoking status: Former    Current packs/day: 0.00    Average packs/day: 1 pack/day for 30.5 years (30.5 ttl pk-yrs)    Types: Cigarettes    Start date: 1966    Quit date: 09/05/1994    Years since quitting: 28.4   Smokeless tobacco: Former    Types: Chew    Quit date: 09/04/2004  Vaping Use   Vaping status: Never Used  Substance and Sexual Activity   Alcohol use: No   Drug use: No   Sexual activity: Not Currently  Other Topics Concern   Not on file  Social History Narrative   Plays racket ball twice a week, works outside    International aid/development worker of [employer] Strain: Low Risk  (11/19/2022)   Overall Financial Resource Strain (CARDIA)    Difficulty of Paying Living Expenses: Not hard at all  Food Insecurity: No Food Insecurity (11/19/2022)   Hunger Vital Sign    Worried About Running Out of Food in the Last Year: Never true    Ran Out of Food in the Last Year: Never true  Transportation Needs: No Transportation Needs (11/19/2022)   PRAPARE - Administrator, Civil Service (Medical): No    Lack of Transportation (Non-Medical): No  Physical Activity: Sufficiently Active (11/19/2022)   Exercise Vital Sign    Days of Exercise per Week: 2 days    Minutes of Exercise per Session: 120 min  Stress: No Stress Concern Present (11/19/2022)   [employer] of Occupational Health - Occupational Stress Questionnaire    Feeling of Stress : Not at all  Social Connections: Socially  Integrated (11/19/2022)   Social Connection and Isolation Panel [NHANES]    Frequency of Communication with Friends and Family: Three times a week    Frequency of Social Gatherings with Friends and Family: More than three times a week    Attends Religious Services: More than 4 times per year    Active Member of [employer] or Organizations: Yes    Attends Banker Meetings: Never    Marital Status: Married    Review of Systems: A 12 point ROS discussed and pertinent positives are indicated in the HPI above.  All other systems are negative.  Review of Systems  Vital Signs: BP (!) 159/78 (BP Location: Right Arm, Patient Position: Sitting, Cuff Size: Normal)   Pulse 62   Temp (!) 97.3 F (36.3 C)   Resp 18   SpO2 98%  Advance Care Plan: The advanced care plan/surrogate decision maker was discussed at the time of visit and the patient did not wish to discuss or was not able to name a surrogate decision maker or provide an advance care plan.    Physical Exam Constitutional:      Appearance: Normal appearance.  HENT:     Head: Normocephalic and atraumatic.  Eyes:     General: No scleral icterus. Cardiovascular:     Rate and Rhythm: Normal rate.  Pulmonary:     Effort: Pulmonary effort is normal.  Abdominal:     General: Abdomen is flat. There is no distension.     Tenderness: There is no abdominal tenderness.  Skin:    General: Skin is warm.  Neurological:     Mental Status: He is alert and oriented to person, place, and time.  Psychiatric:        Mood and Affect: Mood normal.        Behavior: Behavior normal.         Imaging: IR Radiologist Eval & Mgmt  Result Date: 02/18/2023 EXAM: NEW PATIENT OFFICE VISIT CHIEF COMPLAINT: SEE EPIC NOTE HISTORY OF PRESENT ILLNESS: SEE EPIC NOTE REVIEW OF SYSTEMS: SEE EPIC NOTE PHYSICAL EXAMINATION: SEE EPIC NOTE ASSESSMENT AND PLAN: SEE EPIC NOTE Electronically Signed   By: Malachy Moan M.D.   On: 02/18/2023 14:21     Labs:  CBC: Recent Labs    04/16/22 1335 10/15/22 0844 11/28/22 1015  WBC 5.5 4.2 5.7  HGB 14.6 13.1 13.3  HCT 43.6 40.2 41.4  PLT 95* 91* 133*    COAGS: No results for input(s): "INR", "APTT" in the last 8760 hours.  BMP: Recent Labs    04/16/22 1335 10/15/22 0844 02/11/23 1135  NA 143 144  --   K 3.8 4.2  --   CL 105 107*  --   CO2 24 25  --   GLUCOSE 95 99  --   BUN 12 12  --   CALCIUM 8.9 8.8  --   CREATININE 1.06 1.16 1.40*    LIVER FUNCTION TESTS: Recent Labs    04/16/22 1335 10/15/22 0844  BILITOT 1.2 0.9  AST 22 28  ALT 18 21  ALKPHOS 123* 111  PROT 6.0 5.6*  ALBUMIN 4.3 4.2    TUMOR MARKERS: No results for input(s): "AFPTM", "CEA", "CA199", "CHROMGRNA" in the last 8760 hours.  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  He has been holding the Eliquis for the last 6 weeks and has experienced a significant improvement in his hematuria.  Currently, he is relatively unbothered by the phenomenon.  Further, he has no significant lower urinary tract symptoms.  CT imaging demonstrates a normal to small prostate with numerous brachytherapy seeds.  No significant prostamegaly.  He plans on talking with his cardiologist this month to discuss the need to resume Eliquis versus considering undergoing placement of a Watchman device.  At this time, he does not feel urgency to proceed with a prostate artery embolization procedure and the less his bleeding becomes more significant again at which point he would consider the procedure.  He understands that it is minimally invasive and relatively low risk.  However, given the absence of significant BPH his prostatic arteries may be small and there is no guarantee that  the procedure would  result in a significant improvement in his symptoms.  I was able to explain the procedure in detail as well as answer all of his questions.  At this time he would like to pursue watchful waiting and have further discussions with his cardiologist.  If he resumes Eliquis and his hematuria becomes more frequent he will likely reach back out to proceed with prostate artery embolization.  We will await his call.  Thank you for this interesting consult.  I greatly enjoyed meeting [name] [name] and look forward to participating in their care.  A copy of this report was sent to the requesting provider on this date.  Electronically Signed: Sterling Big 02/18/2023, 3:28 PM   I spent a total of  40 Minutes  in face to face in clinical consultation, greater than 50% of which was counseling/coordinating care for intermittent hematuria.

## 2023-02-24 ENCOUNTER — Other Ambulatory Visit: Payer: Self-pay | Admitting: Cardiovascular Disease

## 2023-02-28 ENCOUNTER — Encounter: Payer: Self-pay | Admitting: Cardiovascular Disease

## 2023-02-28 ENCOUNTER — Ambulatory Visit: Payer: PPO | Attending: Cardiovascular Disease | Admitting: Cardiovascular Disease

## 2023-02-28 VITALS — BP 160/88 | HR 75 | Ht 73.0 in | Wt 235.1 lb

## 2023-02-28 DIAGNOSIS — E785 Hyperlipidemia, unspecified: Secondary | ICD-10-CM

## 2023-02-28 DIAGNOSIS — I4891 Unspecified atrial fibrillation: Secondary | ICD-10-CM | POA: Diagnosis not present

## 2023-02-28 DIAGNOSIS — G4733 Obstructive sleep apnea (adult) (pediatric): Secondary | ICD-10-CM | POA: Diagnosis not present

## 2023-02-28 DIAGNOSIS — I700 Atherosclerosis of aorta: Secondary | ICD-10-CM | POA: Diagnosis not present

## 2023-02-28 DIAGNOSIS — I10 Essential (primary) hypertension: Secondary | ICD-10-CM

## 2023-02-28 DIAGNOSIS — N1831 Chronic kidney disease, stage 3a: Secondary | ICD-10-CM

## 2023-02-28 MED ORDER — CARVEDILOL 12.5 MG PO TABS: 12.5000 mg | ORAL_TABLET | Freq: Two times a day (BID) | ORAL | 3 refills | Status: DC

## 2023-02-28 NOTE — Patient Instructions (Addendum)
Medication Instructions:  Please hold the metoprolol  Start coreg/carvedilol 12.5 mg twice a day Monitor blood pressure   If you need a refill on your cardiac medications before your next appointment, please call your pharmacy.   Lab work: No new labs needed  Testing/Procedures: No new testing needed  Follow-Up: At [facility], you and your health needs are our priority.  As part of our continuing mission to provide you with exceptional heart care, we have created designated Provider Care Teams.  These Care Teams include your primary Cardiologist (physician) and Advanced Practice Providers (APPs -  Physician Assistants and Nurse Practitioners) who all work together to provide you with the care you need, when you need it.  You will need a follow up appointment in 12 months  Providers on your designated Care Team:   Nicolasa Ducking, NP Eula Listen, PA-C Cadence Fransico Michael, [place]  COVID-19 Vaccine Information can be found at: [email or web address] For questions related to vaccine distribution or appointments, please email [email or web address] or call [phone].

## 2023-02-28 NOTE — Progress Notes (Signed)
Date:  02/28/2023   ID:  [name] [name], DOB January 05, 1946, MRN [id number]  Patient Location:  [address] [place] [address]   Provider location:   [facility], [place] office  PCP:  Marjie Skiff, NP  Cardiologist:  Fonnie Mu  Chief Complaint  Patient presents with   12 month follow up     "Doing well." Medications reviewed by the patient verbally.     History of Present Illness:    [name] [name] is a 77 y.o. male  past medical history of Afib with RVR with heart rates in the 180's in the setting of hypokalemia and colonoscopy prep,  previous cardioversion  started on Eliquis given his CHADS2VASc of 2 (HTN and age x 1).  Prior hematuria 2021 and in 2024 HTN,  remote smoking history when he was in his 60s and 30s, hypothyroidism,  prostate cancer, CT scan ABD showing no CAD, no aorta disease or iliac vessel  Sleep apnea, wears his CPAP, avg about 3 hours then gets nasal congestion He presents for routine follow-up of his paroxysmal atrial fibrillation  Last seen in clinic November 2023 Over the past several months has had difficulty with hematuria Having significant trouble passing clots, inability to void for up to 12 hours He has been evaluated by urology Prior history of prostate cancer, status post brachytherapy, hypervascular prostate Completed cystoscopy  Has been on and off the Eliquis with recurrent hematuria   prostate cancer seed treatment, off eliquis Was bleeding sept 24 to November (closer to 2 months) Did bladder cath Bleeding stopped, hematuria restarted back on eliquis Currently not on Eliquis Most recently tried December 7 but had hematuria  Considering procedure to restrict blood flow to prostate  Has appointment with EP for consideration of Watchman device in January 2025  Otherwise reports he feels well denies significant shortness of breath, no chest pain on exertion Denies tachycardia or  palpitations concerning for recurrent arrhythmia Remains active  Still goes hunting,  Plays racquetball 2x a week  Labs reviewed Total chol 138, LDL 80  CT scan pulled up , mild aortic athero in branches Previously in [place] that he preferred not to be on a statin  EKG personally reviewed by myself on todays visit EKG Interpretation Date/Time:  Friday February 28 2023 09:10:36 EST Ventricular Rate:  75 PR Interval:  172 QRS Duration:  92 QT Interval:  452 QTC Calculation: 504 R Axis:   1  Text Interpretation: Sinus rhythm with Premature supraventricular complexes Prolonged QT When compared with ECG of 05-May-2015 08:11, Sinus rhythm has replaced Atrial flutter Nonspecific T wave abnormality now evident in Inferior leads Nonspecific T wave abnormality no longer evident in Lateral leads QT has lengthened Confirmed by Julien Nordmann [phone]) on 02/28/2023 9:28:16 AM    Lab Results  Component Value Date   CHOL 138 10/15/2022   HDL 44 10/15/2022   LDLCALC 80 10/15/2022   TRIG 71 10/15/2022   Other past medical hx review  Previous echocardiogram ECHO EF 50 to 55%, 04/18/2015   clinic visit 05/30/16, atrial fibrillation for several days  does not feel as well when playing racquetball     previously on benazepril, stopped for low blood pressure   He did have episode of hematuria May 2017 Had a Cystoscopy. No significant pathology per the patient No more hematuria since then     bowel prep for a routine screening colonoscopy which he was at [facility] as  an outpatient for on 04/18/15.  he was noted to be in new onset Afib with RVR with HR in the 180's  He was completely asymptomatic.     Past Medical History:  Diagnosis Date   A-fib [facility])    Atrial fibrillation with RVR (HCC)    Dupuytren's contracture    Essential hypertension 09/05/2014   Hypertension    Hypokalemia    Hypothyroidism    Malignant neoplasm of prostate (HCC) 09/05/2014   prostate,skin of the scalp   Mitral  regurgitation    a. echo 03/2015: echo 03/2015: EF 50-55%, no RWMA, no sufficienct to allow for LV diastolic fxn, mild MR, LA mildly dilated at 46 mm, RV systolic fxn nl, PASP nl   Nephrolithiasis    New onset atrial fibrillation (HCC) 04/18/2015   OSA (obstructive sleep apnea) 05/11/2015   Persistent atrial fibrillation (HCC)    Past Surgical History:  Procedure Laterality Date   COLONOSCOPY WITH PROPOFOL N/A 04/18/2015   Procedure: COLONOSCOPY WITH PROPOFOL;  Surgeon: Midge Minium, MD;  Location: [facility] ENDOSCOPY;  Service: Endoscopy;  Laterality: N/A;   ELECTROPHYSIOLOGIC STUDY N/A 05/05/2015   Procedure: CARDIOVERSION;  Surgeon: Iran Ouch, MD;  Location: [facility] ORS;  Service: Cardiovascular;  Laterality: N/A;   IR RADIOLOGIST EVAL & MGMT  02/18/2023   RADIOACTIVE SEED IMPLANT N/A      Current Meds  Medication Sig   amiodarone (PACERONE) 200 MG tablet Take 1 tablet by mouth once daily   apixaban (ELIQUIS) 5 MG TABS tablet Take 1 tablet by mouth twice daily   Cholecalciferol (VITAMIN D-3) 125 MCG (5000 UT) TABS Take 5,000 Units by mouth daily.   finasteride (PROSCAR) 5 MG tablet Take 1 tablet (5 mg total) by mouth daily.   levothyroxine (SYNTHROID) 75 MCG tablet Take 1 tablet (75 mcg total) by mouth daily.   metoprolol tartrate (LOPRESSOR) 50 MG tablet Take 1 tablet by mouth twice daily   Multiple Vitamin (MULTIVITAMIN) tablet Take 1 tablet by mouth daily.   olmesartan (BENICAR) 40 MG tablet Take 1 tablet (40 mg total) by mouth daily.   Zinc 50 MG TABS Take 50 mg by mouth daily.     Allergies:   Patient has no known allergies.   Social History   Tobacco Use   Smoking status: Former    Current packs/day: 0.00    Average packs/day: 1 pack/day for 30.5 years (30.5 ttl pk-yrs)    Types: Cigarettes    Start date: 21    Quit date: 09/05/1994    Years since quitting: 28.5   Smokeless tobacco: Former    Types: Chew    Quit date: 09/04/2004  Vaping Use   Vaping status: Never Used   Substance Use Topics   Alcohol use: No   Drug use: No     Family Hx: The patient's family history includes Arthritis in his maternal grandmother; Heart attack in his mother; Heart disease in his father.  ROS:   Please see the history of present illness.    Review of Systems  Constitutional: Negative.   HENT: Negative.    Respiratory: Negative.    Cardiovascular: Negative.   Gastrointestinal: Negative.   Musculoskeletal: Negative.   Neurological: Negative.   Psychiatric/Behavioral: Negative.    All other systems reviewed and are negative.    Labs/Other Tests and Data Reviewed:    Recent Labs: 04/16/2022: TSH 3.980 10/15/2022: ALT 21; BUN 12; Potassium 4.2; Sodium 144 11/28/2022: Hemoglobin 13.3; Platelets 133 02/11/2023: Creatinine, Ser 1.40  Recent Lipid Panel Lab Results  Component Value Date/Time   CHOL 138 10/15/2022 08:44 AM   TRIG 71 10/15/2022 08:44 AM   HDL 44 10/15/2022 08:44 AM   CHOLHDL 2.7 09/24/2017 10:24 AM   LDLCALC 80 10/15/2022 08:44 AM    Wt Readings from Last 3 Encounters:  02/28/23 235 lb 2 oz (106.7 kg)  01/30/23 225 lb (102.1 kg)  12/23/22 225 lb (102.1 kg)     Exam:    BP (!) 160/88 (BP Location: Left Arm, Patient Position: Sitting, Cuff Size: Normal)   Pulse 75   Ht 6\' 1"  (1.854 m)   Wt 235 lb 2 oz (106.7 kg)   SpO2 98%   BMI 31.02 kg/m  Constitutional:  oriented to person, place, and time. No distress.  HENT:  Head: Grossly normal Eyes:  no discharge. No scleral icterus.  Neck: No JVD, no carotid bruits  Cardiovascular: Regular rate and rhythm, no murmurs appreciated Pulmonary/Chest: Clear to auscultation bilaterally, no wheezes or rails Abdominal: Soft.  no distension.  no tenderness.  Musculoskeletal: Normal range of motion Neurological:  normal muscle tone. Coordination normal. No atrophy Skin: Skin warm and dry Psychiatric: normal affect, pleasant   ASSESSMENT & PLAN:    Persistent atrial fibrillation Maintaining  normal sinus rhythm continue amiodarone, metoprolol,  Eliquis currently on hold in the setting of recurrent hematuria following prostate cancer, brachytherapy, hypervascular prostate Scheduled to see Dr. Lalla Brothers in 1 month for consideration of Watchman device He may retrial Eliquis over the next month recommend he hold Eliquis as needed for recurrent hematuria -He will need TSH in January  Essential hypertension Blood pressure elevated, recommend he hold metoprolol tartrate, we will change to Coreg 12.5 twice daily Suggest close monitoring of blood pressure at home, if it runs high will increase dose of carvedilol  OSA (obstructive sleep apnea) Wears CPAP on a regular basis  Encounter for anticoagulation discussion and monitoring Long discussion concerning Eliquis and recent hematuria, followed by urology  Signed, Julien Nordmann, MD  02/28/2023 9:28 AM    [facility] [address], [place], [place] [address]

## 2023-04-01 ENCOUNTER — Encounter: Payer: Self-pay | Admitting: Cardiovascular Disease

## 2023-04-01 NOTE — Progress Notes (Signed)
 RRR quick question (calm Electrophysiology Office Note:    Date:  04/02/2023   ID:  [name], DOB 08/03/1945, MRN [id number]  [facility] Cardiologist:  None  [facility] Electrophysiologist:  None   Referring MD: Devorah Fonder, MD   Chief Complaint: Atrial fibrillation  History of Present Illness:    [name] is a 78 year old man who I am seeing today for an evaluation of atrial fibrillation at the request of Dr. Gollan.  The patient has a history of hematuria, hypertension, tobacco abuse, hypothyroidism, prostate cancer, sleep apnea on CPAP.  His hematuria history is significant with the passing of clots and inability to void for up to 12 hours at a time.  He has been evaluated by urology with cystoscopy in the past.  This is required him to come on and off of Eliquis .  He is active and plays pickle ball frequently.  He is being referred for consideration of A-fib management and watchman.  He currently takes amiodarone  for his A-fib.  He is doing well today.  He has been off his Eliquis  since February 16, 2023.  Despite being off his Eliquis  his hematuria has returned.  He has an upcoming appointment to discuss embolization of the prostate.     Their past medical, social and family history was reviewed.   ROS:   Please see the history of present illness.    All other systems reviewed and are negative.  EKGs/Labs/Other Studies Reviewed:    The following studies were reviewed today:   May 30, 2016 EKG shows atrial fibrillation with a rapid ventricular rate  April 18, 2015 echo EF 50-55 Mild MR Mildly dilated left atrium RV normal        Physical Exam:    VS:  BP (!) 138/100 (BP Location: Left Arm, Patient Position: Sitting, Cuff Size: Normal)   Pulse 65   Ht 6\' 1"  (1.854 m)   Wt 236 lb 2 oz (107.1 kg)   SpO2 98%   BMI 31.15 kg/m     Wt Readings from Last 3 Encounters:  04/02/23 236 lb 2 oz (107.1 kg)  02/28/23 235 lb 2 oz (106.7 kg)   01/30/23 225 lb (102.1 kg)     GEN: no distress CARD: RRR, No MRG RESP: No IWOB. CTAB.        ASSESSMENT AND PLAN:    1. Persistent atrial fibrillation (HCC)   2. Encounter for long-term (current) use of high-risk medication   3. Hematuria, unspecified type     #Persistent atrial fibrillation The patient has a long history of atrial fibrillation managed with amiodarone .  He has maintained rhythm on amiodarone  for several years. Last CMP in July 2024 showed stable liver function.  January 2024 TSH normal.  Today I discussed management strategies for his atrial fibrillation and reducing stroke risk.  For his atrial fibrillation he has been on amiodarone  for quite some time.  I discussed the possibility of catheter ablation in an effort to avoid indefinite exposure to amiodarone .  I think he is a reasonable candidate for ablation with a high likelihood of success given his ability to maintain normal rhythm for an extended period of time.  I discussed the ablation procedure in detail including the risks and recovery.  Before considering any procedure, his hematuria will need to be more controlled.  He has an upcoming appointment to discuss embolization of the prostate.  We will plan to touch base with him 3 months after today's appointment to  assess the status of his hematuria.  If his hematuria has calm down, can proceed with either a staged procedure or concomitant ablation/watchman.  ---------  Discussed treatment options today for AF including antiarrhythmic drug therapy and ablation. Discussed risks, recovery and likelihood of success with each treatment strategy. Risk, benefits, and alternatives to EP study and ablation for afib were discussed. These risks include but are not limited to stroke, bleeding, vascular damage, tamponade, perforation, damage to the esophagus, lungs, phrenic nerve and other structures, pulmonary vein stenosis, worsening renal function, coronary vasospasm and  death.  Discussed potential need for repeat ablation procedures and antiarrhythmic drugs after an initial ablation. The patient understands these risks.  We will therefore proceed with catheter ablation at the next available time.  Carto, ICE, anesthesia are requested for the procedure.  -----------  I have seen [name] in the office today who is being considered for a Watchman left atrial appendage closure device. I believe they will benefit from this procedure given their history of atrial fibrillation, CHA2DS2-VASc score of 3 and unadjusted ischemic stroke rate of 3.2% per year. Unfortunately, the patient is not felt to be a long term anticoagulation candidate secondary to hematuria. The patient's chart has been reviewed and I feel that they would be a candidate for short term oral anticoagulation after Watchman implant.   It is my belief that after undergoing a LAA closure procedure, [name] will not need long term anticoagulation which eliminates anticoagulation side effects and major bleeding risk.   Procedural risks for the Watchman implant have been reviewed with the patient including a 0.5% risk of stroke, <1% risk of perforation and <1% risk of device embolization. Other risks include bleeding, vascular damage, tamponade, worsening renal function, and death. The patient understands these risks.   The published clinical data on the safety and effectiveness of WATCHMAN include but are not limited to the following: - Holmes DR, Evalene Hilda, Sick P et al. for the PROTECT AF Investigators. Percutaneous closure of the left atrial appendage versus warfarin therapy for prevention of stroke in patients with atrial fibrillation: a randomised non-inferiority trial. Lancet 2009; 374: 534-42. Evalene Hilda, Doshi SK, Deloria Fetch D et al. on behalf of the PROTECT AF Investigators. Percutaneous Left Atrial Appendage Closure for Stroke Prophylaxis in Patients With Atrial Fibrillation  2.3-Year Follow-up of the PROTECT AF (Watchman Left Atrial Appendage System for Embolic Protection in Patients With Atrial Fibrillation) Trial. Circulation 2013; 127:720-729. - Alli O, Doshi S,  Kar S, Reddy VY, Sievert H et al. Quality of Life Assessment in the Randomized PROTECT AF (Percutaneous Closure of the Left Atrial Appendage Versus Warfarin Therapy for Prevention of Stroke in Patients With Atrial Fibrillation) Trial of Patients at Risk for Stroke With Nonvalvular Atrial Fibrillation. J Am Coll Cardiol 2013; 61:1790-8. Bartholome Ligas DR, Mario Sicard, Price M, Whisenant B, Sievert H, Doshi S, Huber K, Reddy V. Prospective randomized evaluation of the Watchman left atrial appendage Device in patients with atrial fibrillation versus long-term warfarin therapy; the PREVAIL trial. Journal of the [employer] of Cardiology, Vol. 4, No. 1, 2014, 1-11. - Kar S, Doshi SK, Sadhu A, Horton R, Osorio J et al. Primary outcome evaluation of a next-generation left atrial appendage closure device: results from the PINNACLE FLX trial. Circulation 2021;143(18)1754-1762.    After today's visit with the patient which was dedicated solely for shared decision making visit regarding LAA closure device, the patient decided to proceed with the LAA appendage closure  procedure scheduled to be done in the near future at [facility].   HAS-BLED score 3 Hypertension Yes  Abnormal renal and liver function (Dialysis, transplant, Cr >2.26 mg/dL /Cirrhosis or Bilirubin >2x Normal or AST/ALT/AP >3x Normal) No  Stroke No  Bleeding Yes  Labile INR (Unstable/high INR) No  Elderly (>65) Yes  Drugs or alcohol (>= 8 drinks/week, anti-plt or NSAID) No   CHA2DS2-VASc Score = 3  The patient's score is based upon: CHF History: 0 HTN History: 1 Diabetes History: 0 Stroke History: 0 Vascular Disease History: 0 Age Score: 2 Gender Score: 0   If his hematuria is controlled at the 63-month follow-up, okay to schedule both  procedures as above.  He will need to restart Eliquis  1 week prior to the procedure and continue for 90 days if concomitant procedures planned.  If a staged procedure is pursued, we will need Eliquis  for 45 days following watchman implant.  He will need an updated echocardiogram in addition to CT scan prior to the procedure.  For his amiodarone , update CMP, TSH and free T4.  Will also update CBC given his hematuria history.   #Hypertension At goal today.  Recommend checking blood pressures 1-2 times per week at home and recording the values.  Recommend bringing these recordings to the primary care physician.  Follow-up 3 months with EP APP.  Signed, Leanora Prophet. Marven Slimmer, MD, [facility], [facility] 04/02/2023 9:20 AM    Electrophysiology  [facility]

## 2023-04-02 ENCOUNTER — Encounter: Payer: Self-pay | Admitting: Cardiology

## 2023-04-02 ENCOUNTER — Ambulatory Visit: Payer: PPO | Attending: Cardiology | Admitting: Cardiology

## 2023-04-02 ENCOUNTER — Other Ambulatory Visit: Payer: Self-pay

## 2023-04-02 VITALS — BP 138/100 | HR 65 | Ht 73.0 in | Wt 236.1 lb

## 2023-04-02 DIAGNOSIS — I4819 Other persistent atrial fibrillation: Secondary | ICD-10-CM

## 2023-04-02 DIAGNOSIS — R319 Hematuria, unspecified: Secondary | ICD-10-CM | POA: Diagnosis not present

## 2023-04-02 DIAGNOSIS — Z79899 Other long term (current) drug therapy: Secondary | ICD-10-CM

## 2023-04-02 NOTE — Patient Instructions (Signed)
 Medication Instructions:  Your physician recommends that you continue on your current medications as directed. Please refer to the Current Medication list given to you today.  *If you need a refill on your cardiac medications before your next appointment, please call your pharmacy*  Labs: TODAY: CMET, TSH, T4, CBC  Follow-Up: At [facility], you and your health needs are our priority.  As part of our continuing mission to provide you with exceptional heart care, we have created designated Provider Care Teams.  These Care Teams include your primary Cardiologist (physician) and Advanced Practice Providers (APPs -  Physician Assistants and Nurse Practitioners) who all work together to provide you with the care you need, when you need it.  Your next appointment:   3 months  Provider:   Suzann Riddle, NP

## 2023-04-03 ENCOUNTER — Other Ambulatory Visit: Payer: Self-pay | Admitting: Interventional Radiology

## 2023-04-03 DIAGNOSIS — R310 Gross hematuria: Secondary | ICD-10-CM

## 2023-04-03 LAB — COMPREHENSIVE METABOLIC PANEL
ALT: 19 [IU]/L (ref 0–44)
AST: 21 [IU]/L (ref 0–40)
Albumin: 4.2 g/dL (ref 3.8–4.8)
Alkaline Phosphatase: 102 [IU]/L (ref 44–121)
BUN/Creatinine Ratio: 13 (ref 10–24)
BUN: 17 mg/dL (ref 8–27)
Bilirubin Total: 0.9 mg/dL (ref 0.0–1.2)
CO2: 23 mmol/L (ref 20–29)
Calcium: 8.8 mg/dL (ref 8.6–10.2)
Chloride: 108 mmol/L — ABNORMAL HIGH (ref 96–106)
Creatinine, Ser: 1.27 mg/dL (ref 0.76–1.27)
Globulin, Total: 1.5 g/dL (ref 1.5–4.5)
Glucose: 91 mg/dL (ref 70–99)
Potassium: 3.9 mmol/L (ref 3.5–5.2)
Sodium: 146 mmol/L — ABNORMAL HIGH (ref 134–144)
Total Protein: 5.7 g/dL — ABNORMAL LOW (ref 6.0–8.5)
eGFR: 58 mL/min/{1.73_m2} — ABNORMAL LOW (ref >59)

## 2023-04-03 LAB — CBC
Hematocrit: 38.8 % (ref 37.5–51.0)
Hemoglobin: 12.3 g/dL — ABNORMAL LOW (ref 13.0–17.7)
MCH: 31.1 pg (ref 26.6–33.0)
MCHC: 31.7 g/dL (ref 31.5–35.7)
MCV: 98 fL — ABNORMAL HIGH (ref 79–97)
Platelets: 90 10*3/uL — CL (ref 150–450)
RBC: 3.96 x10E6/uL — ABNORMAL LOW (ref 4.14–5.80)
RDW: 15.2 % (ref 11.6–15.4)
WBC: 3.7 10*3/uL (ref 3.4–10.8)

## 2023-04-03 LAB — TSH: TSH: 5.45 u[IU]/mL — ABNORMAL HIGH (ref 0.450–4.500)

## 2023-04-03 LAB — T4, FREE: Free T4: 1.87 ng/dL — ABNORMAL HIGH (ref 0.82–1.77)

## 2023-04-04 ENCOUNTER — Other Ambulatory Visit: Payer: Self-pay

## 2023-04-04 DIAGNOSIS — R319 Hematuria, unspecified: Secondary | ICD-10-CM

## 2023-04-04 DIAGNOSIS — I4819 Other persistent atrial fibrillation: Secondary | ICD-10-CM

## 2023-04-04 DIAGNOSIS — Z79899 Other long term (current) drug therapy: Secondary | ICD-10-CM

## 2023-04-17 ENCOUNTER — Other Ambulatory Visit: Payer: Self-pay | Admitting: Interventional Radiology

## 2023-04-17 ENCOUNTER — Ambulatory Visit
Admission: RE | Admit: 2023-04-17 | Discharge: 2023-04-17 | Disposition: A | Discharge status: Home / Self Care | Payer: PPO | Source: Ambulatory Visit | Attending: Interventional Radiology | Admitting: Interventional Radiology

## 2023-04-17 DIAGNOSIS — N029 Recurrent and persistent hematuria with unspecified morphologic changes: Secondary | ICD-10-CM | POA: Diagnosis not present

## 2023-04-17 DIAGNOSIS — N401 Enlarged prostate with lower urinary tract symptoms: Secondary | ICD-10-CM

## 2023-04-17 DIAGNOSIS — R310 Gross hematuria: Secondary | ICD-10-CM

## 2023-04-17 HISTORY — PX: IR RADIOLOGIST EVAL & MGMT: IMG5224

## 2023-04-17 NOTE — Progress Notes (Signed)
Chief Complaint: Patient was consulted remotely today (TeleHealth) for persistent hematuria at the request of Cleland Simkins K.    Referring Physician(s): Aryannah Mohon K (via Legrand Rams, MD)  History of Present Illness: [name] [name] is a 78 y.o. 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  His PSA has remained normal since the time of treatment.   Additionally, he has a history of atrial fibrillation for which she has been on Eliquis.  Over the past year he has had several episodes of intermittent hematuria including passage of small clots.  This past October he had an episode of more significant bleeding and clot formation which resulted in urinary retention requiring Foley catheter placement and bladder irrigation.   He has subsequently been seen by Dr. Signa Kell and had a cystoscopy which revealed some areas of hypervascularity within the bladder base and median lobe of the prostate gland.  Differential considerations include radiation cystoprostatitis versus hypervascular prostate tissue from focal benign prostatic hyperplasia.   He denies significant symptoms of lower urinary tract issues.  He scored only 5 out of 35 on the international prostate symptom score questionnaire.   On prior CT imaging, his prostate gland is quite small and is studded with brachytherapy seeds.   He has held his Eliquis since his last episode of urinary retention and has noticed a significant improvement in his intermittent hematuria.  However, he continues to pass bloody urine and small clots.  Over the past 15 days he reports that he passed clots every day except for 2.  Further, he has met with his interventional cardiologist and discussed proceeding with the Watchman procedure.  His cardiologist has informed him that he would need to resume the Eliquis for 2 months following placement of the watchman.  Both his cardiologist and the patient are  concerned that resuming the Eliquis will result in exacerbation of his hematuria potentially including recurrent clot retention.  He is very interested in pursuing a prostate artery embolization at this time.  Past Medical History:  Diagnosis Date   A-fib [facility])    Atrial fibrillation with RVR (HCC)    Dupuytren's contracture    Essential hypertension 09/05/2014   Hypertension    Hypokalemia    Hypothyroidism    Malignant neoplasm of prostate (HCC) 09/05/2014   prostate,skin of the scalp   Mitral regurgitation    a. echo 03/2015: echo 03/2015: EF 50-55%, no RWMA, no sufficienct to allow for LV diastolic fxn, mild MR, LA mildly dilated at 46 mm, RV systolic fxn nl, PASP nl   Nephrolithiasis    New onset atrial fibrillation (HCC) 04/18/2015   OSA (obstructive sleep apnea) 05/11/2015   Persistent atrial fibrillation (HCC)     Past Surgical History:  Procedure Laterality Date   COLONOSCOPY WITH PROPOFOL N/A 04/18/2015   Procedure: COLONOSCOPY WITH PROPOFOL;  Surgeon: Midge Minium, MD;  Location: [facility] ENDOSCOPY;  Service: Endoscopy;  Laterality: N/A;   ELECTROPHYSIOLOGIC STUDY N/A 05/05/2015   Procedure: CARDIOVERSION;  Surgeon: Iran Ouch, MD;  Location: [facility] ORS;  Service: Cardiovascular;  Laterality: N/A;   IR RADIOLOGIST EVAL & MGMT  02/18/2023   IR RADIOLOGIST EVAL & MGMT  04/17/2023   RADIOACTIVE SEED IMPLANT N/A     Allergies: Patient has no known allergies.  Medications: Prior to Admission medications   Medication Sig Start Date End Date Taking? Authorizing Provider  amiodarone (PACERONE) 200 MG tablet Take 1 tablet by mouth once daily 02/03/23   Julien Nordmann  J, MD  apixaban (ELIQUIS) 5 MG TABS tablet Take 1 tablet by mouth twice daily Patient not taking: Reported on 04/02/2023 08/15/22   Antonieta Iba, MD  carvedilol (COREG) 12.5 MG tablet Take 1 tablet (12.5 mg total) by mouth 2 (two) times daily. 02/28/23 05/29/23  Antonieta Iba, MD  Cholecalciferol (VITAMIN  D-3) 125 MCG (5000 UT) TABS Take 5,000 Units by mouth daily.    [provider]  cyanocobalamin (VITAMIN B12) 1000 MCG tablet Take 1,000 mcg by mouth daily.    [provider]  finasteride (PROSCAR) 5 MG tablet Take 1 tablet (5 mg total) by mouth daily. 10/15/22   Cannady, Corrie Dandy T, NP  levothyroxine (SYNTHROID) 75 MCG tablet Take 1 tablet (75 mcg total) by mouth daily. 10/15/22   Aura Dials T, NP  Multiple Vitamin (MULTIVITAMIN) tablet Take 1 tablet by mouth daily.    [provider]  olmesartan (BENICAR) 40 MG tablet Take 1 tablet (40 mg total) by mouth daily. 10/15/22   Cannady, Corrie Dandy T, NP  Zinc 50 MG TABS Take 50 mg by mouth daily.    [provider]     Family History  Problem Relation Age of Onset   Heart attack Mother    Heart disease Father    Arthritis Maternal Grandmother     Social History   Socioeconomic History   Marital status: Married    Spouse name: Rene Kocher   Number of children: 2   Years of education: Not on file   Highest education level: Associate degree: academic program  Occupational History   Occupation: retired     Comment: Acupuncturist  Tobacco Use   Smoking status: Former    Current packs/day: 0.00    Average packs/day: 1 pack/day for 30.5 years (30.5 ttl pk-yrs)    Types: Cigarettes    Start date: 1966    Quit date: 09/05/1994    Years since quitting: 28.6   Smokeless tobacco: Former    Types: Chew    Quit date: 09/04/2004  Vaping Use   Vaping status: Never Used  Substance and Sexual Activity   Alcohol use: No   Drug use: No   Sexual activity: Not Currently  Other Topics Concern   Not on file  Social History Narrative   Plays racket ball twice a week, works outside    [employer]: Low Risk  (11/19/2022)   Overall Financial Resource Strain (CARDIA)    Difficulty of Paying Living Expenses: Not hard at all  Food Insecurity: No Food Insecurity (11/19/2022)    Hunger Vital Sign    Worried About Running Out of Food in the Last Year: Never true    Ran Out of Food in the Last Year: Never true  Transportation Needs: No Transportation Needs (11/19/2022)   PRAPARE - Administrator, Civil Service (Medical): No    Lack of Transportation (Non-Medical): No  Physical Activity: Sufficiently Active (11/19/2022)   Exercise Vital Sign    Days of Exercise per Week: 2 days    Minutes of Exercise per Session: 120 min  Stress: No Stress Concern Present (11/19/2022)   [employer] of Occupational Health - Occupational Stress Questionnaire    Feeling of Stress : Not at all  Social Connections: Socially Integrated (11/19/2022)   Social Connection and Isolation Panel [NHANES]    Frequency of Communication with Friends and Family: Three times a week    Frequency of Social Gatherings  with Friends and Family: More than three times a week    Attends Religious Services: More than 4 times per year    Active Member of Clubs or Organizations: Yes    Attends Banker Meetings: Never    Marital Status: Married     Review of Systems  Review of Systems: A 12 point ROS discussed and pertinent positives are indicated in the HPI above.  All other systems are negative.  Advance Care Plan: The advanced care plan/surrogate decision maker was discussed at the time of visit and the patient did not wish to discuss or was not able to name a surrogate decision maker or provide an advance care plan.    Physical Exam No direct physical exam was performed (except for noted visual exam findings with Video Visits).    Vital Signs: There were no vitals taken for this visit.  Imaging: IR Radiologist Eval & Mgmt Result Date: 04/17/2023 EXAM: ESTABLISHED PATIENT OFFICE VISIT CHIEF COMPLAINT: SEE EPIC NOTE HISTORY OF PRESENT ILLNESS: SEE EPIC NOTE REVIEW OF SYSTEMS: SEE EPIC NOTE PHYSICAL EXAMINATION: SEE EPIC NOTE ASSESSMENT AND PLAN: SEE EPIC NOTE Electronically  Signed   By: Malachy Moan M.D.   On: 04/17/2023 08:43    Labs:  CBC: Recent Labs    10/15/22 0844 11/28/22 1015 04/02/23 0927  WBC 4.2 5.7 3.7  HGB 13.1 13.3 12.3*  HCT 40.2 41.4 38.8  PLT 91* 133* 90*    COAGS: No results for input(s): "INR", "APTT" in the last 8760 hours.  BMP: Recent Labs    10/15/22 0844 02/11/23 1135 04/02/23 0927  NA 144  --  146*  K 4.2  --  3.9  CL 107*  --  108*  CO2 25  --  23  GLUCOSE 99  --  91  BUN 12  --  17  CALCIUM 8.8  --  8.8  CREATININE 1.16 1.40* 1.27    LIVER FUNCTION TESTS: Recent Labs    10/15/22 0844 04/02/23 0927  BILITOT 0.9 0.9  AST 28 21  ALT 21 19  ALKPHOS 111 102  PROT 5.6* 5.7*  ALBUMIN 4.2 4.2    TUMOR MARKERS: No results for input(s): "AFPTM", "CEA", "CA199", "CHROMGRNA" in the last 8760 hours.  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  Despite being off his Eliquis now for more than 6 weeks he continues to have hematuria with passage of small clots.  Over the last 15 days he passed clots on 13 of the 15 days.  Additionally, he has spoken with his interventional cardiologist who is considering placement of a Watchman device as well as an atrial ablation procedure.  He will have to resume anticoagulation for 2 months following placement of the Watchman.  He and his cardiologist agree that he would be at risk for developing recurrent worsening hematuria and clot retention.  Therefore, he is decided he would like to pursue prostatic artery embolization.  As we previously reviewed, he understands the risks, benefits and alternatives to prostatic artery embolization.  He again voices understanding and desire to proceed.  1.) Please schedule for PAE at [facility].      Electronically Signed: Sterling Big 04/17/2023, 10:29 AM   I spent a total of  15 Minutes in remote  clinical consultation, greater than 50% of which was  counseling/coordinating care for hematuria.    Visit type: Audio only (telephone). Audio (no video) only due  to patient preference. Alternative for in-person consultation at [facility], [address] [place], [place], [place]. This visit type was conducted due to national recommendations for restrictions regarding the COVID-19 Pandemic (e.g. social distancing).  This format is felt to be most appropriate for this patient at this time.  All issues noted in this document were discussed and addressed.

## 2023-04-20 NOTE — Patient Instructions (Signed)
 Be Involved in Caring For Your Health:  Taking Medications When medications are taken as directed, they can greatly improve your health. But if they are not taken as prescribed, they may not work. In some cases, not taking them correctly can be harmful. To help ensure your treatment remains effective and safe, understand your medications and how to take them. Bring your medications to each visit for review by your provider.  Your lab results, notes, and after visit summary will be available on My Chart. We strongly encourage you to use this feature. If lab results are abnormal the clinic will contact you with the appropriate steps. If the clinic does not contact you assume the results are satisfactory. You can always view your results on My Chart. If you have questions regarding your health or results, please contact the clinic during office hours. You can also ask questions on My Chart.  We at [facility] are grateful that you chose [place] to provide your care. We strive to provide evidence-based and compassionate care and are always looking for feedback. If you get a survey from the clinic please complete this so we can hear your opinions.  Heart-Healthy Eating Plan Many factors influence your heart health, including eating and exercise habits. Heart health is also called coronary health. Coronary risk increases with abnormal blood fat (lipid) levels. A heart-healthy eating plan includes limiting unhealthy fats, increasing healthy fats, limiting salt (sodium) intake, and making other diet and lifestyle changes. What is my plan? Your health care provider may recommend that: You limit your fat intake to _________% or less of your total calories each day. You limit your saturated fat intake to _________% or less of your total calories each day. You limit the amount of cholesterol in your diet to less than _________ mg per day. You limit the amount of sodium in your diet to less than _________  mg per day. What are tips for following this plan? Cooking Cook foods using methods other than frying. Baking, boiling, grilling, and broiling are all good options. Other ways to reduce fat include: Removing the skin from poultry. Removing all visible fats from meats. Steaming vegetables in water or broth. Meal planning  At meals, imagine dividing your plate into fourths: Fill one-half of your plate with vegetables and green salads. Fill one-fourth of your plate with whole grains. Fill one-fourth of your plate with lean protein foods. Eat 2-4 cups of vegetables per day. One cup of vegetables equals 1 cup (91 g) broccoli or cauliflower florets, 2 medium carrots, 1 large bell pepper, 1 large sweet potato, 1 large tomato, 1 medium white potato, 2 cups (150 g) raw leafy greens. Eat 1-2 cups of fruit per day. One cup of fruit equals 1 small apple, 1 large banana, 1 cup (237 g) mixed fruit, 1 large orange,  cup (82 g) dried fruit, 1 cup (240 mL) 100% fruit juice. Eat more foods that contain soluble fiber. Examples include apples, broccoli, carrots, beans, peas, and barley. Aim to get 25-30 g of fiber per day. Increase your consumption of legumes, nuts, and seeds to 4-5 servings per week. One serving of dried beans or legumes equals  cup (90 g) cooked, 1 serving of nuts is  oz (12 almonds, 24 pistachios, or 7 walnut halves), and 1 serving of seeds equals  oz (8 g). Fats Choose healthy fats more often. Choose monounsaturated and polyunsaturated fats, such as olive and canola oils, avocado oil, flaxseeds, walnuts, almonds, and seeds. Eat  more omega-3 fats. Choose salmon, mackerel, sardines, tuna, flaxseed oil, and ground flaxseeds. Aim to eat fish at least 2 times each week. Check food labels carefully to identify foods with trans fats or high amounts of saturated fat. Limit saturated fats. These are found in animal products, such as meats, butter, and cream. Plant sources of saturated fats  include palm oil, palm kernel oil, and coconut oil. Avoid foods with partially hydrogenated oils in them. These contain trans fats. Examples are stick margarine, some tub margarines, cookies, crackers, and other baked goods. Avoid fried foods. General information Eat more home-cooked food and less restaurant, buffet, and fast food. Limit or avoid alcohol. Limit foods that are high in added sugar and simple starches such as foods made using white refined flour (white breads, pastries, sweets). Lose weight if you are overweight. Losing just 5-10% of your body weight can help your overall health and prevent diseases such as diabetes and heart disease. Monitor your sodium intake, especially if you have high blood pressure. Talk with your health care provider about your sodium intake. Try to incorporate more vegetarian meals weekly. What foods should I eat? Fruits All fresh, canned (in natural juice), or frozen fruits. Vegetables Fresh or frozen vegetables (raw, steamed, roasted, or grilled). Green salads. Grains Most grains. Choose whole wheat and whole grains most of the time. Rice and pasta, including brown rice and pastas made with whole wheat. Meats and other proteins Lean, well-trimmed beef, veal, pork, and lamb. Chicken and [place] without skin. All fish and shellfish. Wild duck, rabbit, pheasant, and venison. Egg whites or low-cholesterol egg substitutes. Dried beans, peas, lentils, and tofu. Seeds and most nuts. Dairy Low-fat or nonfat cheeses, including ricotta and mozzarella. Skim or 1% milk (liquid, powdered, or evaporated). Buttermilk made with low-fat milk. Nonfat or low-fat yogurt. Fats and oils Non-hydrogenated (trans-free) margarines. Vegetable oils, including soybean, sesame, sunflower, olive, avocado, peanut, safflower, corn, canola, and cottonseed. Salad dressings or mayonnaise made with a vegetable oil. Beverages Water (mineral or sparkling). Coffee and tea. Unsweetened ice  tea. Diet beverages. Sweets and desserts Sherbet, gelatin, and fruit ice. Small amounts of dark chocolate. Limit all sweets and desserts. Seasonings and condiments All seasonings and condiments. The items listed above may not be a complete list of foods and beverages you can eat. Contact a dietitian for more options. What foods should I avoid? Fruits Canned fruit in heavy syrup. Fruit in cream or butter sauce. Fried fruit. Limit coconut. Vegetables Vegetables cooked in cheese, cream, or butter sauce. Fried vegetables. Grains Breads made with saturated or trans fats, oils, or whole milk. Croissants. Sweet rolls. Donuts. High-fat crackers, such as cheese crackers and chips. Meats and other proteins Fatty meats, such as hot dogs, ribs, sausage, bacon, rib-eye roast or steak. High-fat deli meats, such as salami and bologna. Caviar. Domestic duck and goose. Organ meats, such as liver. Dairy Cream, sour cream, cream cheese, and creamed cottage cheese. Whole-milk cheeses. Whole or 2% milk (liquid, evaporated, or condensed). Whole buttermilk. Cream sauce or high-fat cheese sauce. Whole-milk yogurt. Fats and oils Meat fat, or shortening. Cocoa butter, hydrogenated oils, palm oil, coconut oil, palm kernel oil. Solid fats and shortenings, including bacon fat, salt pork, lard, and butter. Nondairy cream substitutes. Salad dressings with cheese or sour cream. Beverages Regular sodas and any drinks with added sugar. Sweets and desserts Frosting. Pudding. Cookies. Cakes. Pies. Milk chocolate or white chocolate. Buttered syrups. Full-fat ice cream or ice cream drinks. The items listed above may  not be a complete list of foods and beverages to avoid. Contact a dietitian for more information. Summary Heart-healthy meal planning includes limiting unhealthy fats, increasing healthy fats, limiting salt (sodium) intake and making other diet and lifestyle changes. Lose weight if you are overweight. Losing just  5-10% of your body weight can help your overall health and prevent diseases such as diabetes and heart disease. Focus on eating a balance of foods, including fruits and vegetables, low-fat or nonfat dairy, lean protein, nuts and legumes, whole grains, and heart-healthy oils and fats. This information is not intended to replace advice given to you by your health care provider. Make sure you discuss any questions you have with your health care provider. Document Revised: 04/09/2021 Document Reviewed: 04/09/2021 Elsevier Patient Education  2024 [employer].

## 2023-04-22 ENCOUNTER — Encounter: Payer: Self-pay | Admitting: Nurse Practitioner

## 2023-04-22 ENCOUNTER — Ambulatory Visit (INDEPENDENT_AMBULATORY_CARE_PROVIDER_SITE_OTHER): Payer: PPO | Admitting: Nurse Practitioner

## 2023-04-22 VITALS — BP 132/80 | HR 61 | Temp 97.7°F | Wt 235.4 lb

## 2023-04-22 DIAGNOSIS — I10 Essential (primary) hypertension: Secondary | ICD-10-CM | POA: Diagnosis not present

## 2023-04-22 DIAGNOSIS — Z8546 Personal history of malignant neoplasm of prostate: Secondary | ICD-10-CM | POA: Diagnosis not present

## 2023-04-22 DIAGNOSIS — Z0000 Encounter for general adult medical examination without abnormal findings: Secondary | ICD-10-CM | POA: Diagnosis not present

## 2023-04-22 DIAGNOSIS — G4733 Obstructive sleep apnea (adult) (pediatric): Secondary | ICD-10-CM | POA: Diagnosis not present

## 2023-04-22 DIAGNOSIS — N1831 Chronic kidney disease, stage 3a: Secondary | ICD-10-CM | POA: Diagnosis not present

## 2023-04-22 DIAGNOSIS — E785 Hyperlipidemia, unspecified: Secondary | ICD-10-CM

## 2023-04-22 DIAGNOSIS — R3121 Asymptomatic microscopic hematuria: Secondary | ICD-10-CM | POA: Diagnosis not present

## 2023-04-22 DIAGNOSIS — D6869 Other thrombophilia: Secondary | ICD-10-CM | POA: Diagnosis not present

## 2023-04-22 DIAGNOSIS — I700 Atherosclerosis of aorta: Secondary | ICD-10-CM | POA: Diagnosis not present

## 2023-04-22 DIAGNOSIS — E039 Hypothyroidism, unspecified: Secondary | ICD-10-CM

## 2023-04-22 DIAGNOSIS — N401 Enlarged prostate with lower urinary tract symptoms: Secondary | ICD-10-CM

## 2023-04-22 DIAGNOSIS — I4891 Unspecified atrial fibrillation: Secondary | ICD-10-CM | POA: Diagnosis not present

## 2023-04-22 DIAGNOSIS — D696 Thrombocytopenia, unspecified: Secondary | ICD-10-CM

## 2023-04-22 DIAGNOSIS — Z6829 Body mass index (BMI) 29.0-29.9, adult: Secondary | ICD-10-CM

## 2023-04-22 NOTE — Assessment & Plan Note (Signed)
On Eliquis and has A-Fib.  Continue to monitor for any bleeding or wounds.  CBC annually.

## 2023-04-22 NOTE — Assessment & Plan Note (Signed)
Ongoing.  Seen on CT scan 11/02/20 -- have recommended statin initiation for prevention and reviewed imaging and ASCVD scoring with him.  He wishes to think about this and repeat lipid panel.

## 2023-04-22 NOTE — Assessment & Plan Note (Signed)
Ongoing.  No current statin, discussed at length current levels and ASCVD 36.1%.  He wishes to further discuss with cardiology.  If statin initiated would need to be low dose and assess closely as takes Amiodarone.  Lipid check today.

## 2023-04-22 NOTE — Assessment & Plan Note (Signed)
Ongoing.  Noted past labs for years ranging from 90 to 146.  Monitor closely and send to hematology as needed.  We discussed at length things to avoid and things to add into diet daily.  No Ibuprofen or ASA.

## 2023-04-22 NOTE — Assessment & Plan Note (Signed)
Chronic, noted on past labs on and off since 2019 -- recent improved.  Monitor closely and recommend adequate hydration at home + avoid NSAIDs.  CMP today.  Refer to nephrology as needed.  Continue Olmesartan for BP, which may also offer benefit to kidneys.

## 2023-04-22 NOTE — Assessment & Plan Note (Signed)
History of with seed placement.  Continue collaboration with urology, recent note reviewed.

## 2023-04-22 NOTE — Assessment & Plan Note (Signed)
Ongoing and stable.  Continue use of CPAP 100% of the time.

## 2023-04-22 NOTE — Assessment & Plan Note (Signed)
Followed by urology, recent note reviewed.  Continue this collaboration.

## 2023-04-22 NOTE — Assessment & Plan Note (Signed)
Chronic, ongoing.  Continue current medication regimen and adjust as needed. Thyroid labs today, recent TSH elevated.  Monitor closely as is also taking Amiodarone.

## 2023-04-22 NOTE — Assessment & Plan Note (Signed)
Chronic, stable with rate controlled.  Continue current medication regimen and collaboration with caridology.  Plan is for Watchman in future once hematuria under control.

## 2023-04-22 NOTE — Progress Notes (Signed)
 BP 132/80 (BP Location: Left Arm, Patient Position: Sitting, Cuff Size: Normal)   Pulse 61   Temp 97.7 F (36.5 C) (Oral)   Wt 235 lb 6.4 oz (106.8 kg)   SpO2 99%   BMI 31.06 kg/m    Subjective:    Patient ID: [name] [name], male    DOB: [birth year], 78 y.o.   MRN: [id number]  HPI: [name] [name] is a 78 y.o. male presenting on 04/22/2023 for comprehensive medical examination. Current medical complaints include:none  He currently lives with: wife Interim Problems from his last visit: no  HYPERTENSION / HYPERLIPIDEMIA Continues Coreg  25 MG BID, Amiodarone  200 MG daily, Olmesartan  40 MG daily. Discussed statin therapy with patient and current ASCVD.  History of aortic atherosclerosis noted on imaging.  Continues to use CPAP nightly. Satisfied with current treatment? yes Duration of hypertension: years BP monitoring frequency: a few times a week BP range: 96/52 to 154/96, on average 129/75 BP medication side effects: no Duration of hyperlipidemia: years Cholesterol medication side effects:  not taking Cholesterol supplements: none Medication compliance: excellent compliance Aspirin : no Recent stressors: no Recurrent headaches: no Visual changes: no Palpitations: no Dyspnea: no Chest pain: no Lower extremity edema: no Dizzy/lightheaded: no  The 10-year ASCVD risk score (Arnett DK, et al., 2019) is: 33.5%   Values used to calculate the score:     Age: 63 years     Sex: Male     Is Non-Hispanic African American: No     Diabetic: No     Tobacco smoker: No     Systolic Blood Pressure: 132 mmHg     Is BP treated: Yes     HDL Cholesterol: 44 mg/dL     Total Cholesterol: 138 mg/dL  ATRIAL FIBRILLATION Saw cardiology last on 04/02/23 and they are discussing Watchman with him (Dr. Cindie).  Want to get hematuria under control first which urology is working on.  Continues on Amiodarone  and Carvedilol .  Currently off Eliquis  due to bleeding. Atrial fibrillation status:  stable Satisfied with current treatment: yes  Medication side effects:  no Medication compliance: poor compliance Etiology of atrial fibrillation:  Palpitations:  no Chest pain:  no Dyspnea on exertion:  no Orthopnea:  no Syncope:  no Edema:  no Ventricular rate control: B-blocker Anti-coagulation: none at present -- refer to above   CHRONIC KIDNEY DISEASE (CKD 3a) This waxes and wanes. CKD status: stable Medications renally dose: yes Previous renal evaluation: no Pneumovax:  Up to Date Influenza Vaccine:  Up to Date   HYPOTHYROIDISM Continues on Levothyroxine  75 MCG daily. Thyroid  control status:stable Satisfied with current treatment? yes Medication side effects: no Medication compliance: excellent compliance Etiology of hypothyroidism:  Recent dose adjustment:yes Fatigue: no Cold intolerance: no Heat intolerance: no Weight gain: no Weight loss: no Constipation: no Diarrhea/loose stools: no Palpitations: no Lower extremity edema: no Anxiety/depressed mood: no    BPH Last saw urology on 12/23/22.  History of prostate cancer and they are monitoring his hematuria. Plan is to do prostate embolization to assist with this.  Continues on Finasteride . BPH status: stable Satisfied with current treatment?: yes Medication side effects: no Medication compliance: excellent compliance Duration: years Nocturia: 4-5 times a night due to increased water intake Urinary frequency:no Incomplete voiding: occasional Urgency: no Weak urinary stream: yes Straining to start stream: occasional Dysuria: no Severity: mild Alleviating factors: Finasteride  Aggravating factors: increased water intake Treatments attempted: Finasteride  IPSS Questionnaire (AUA-7): 7 AUA Over the past month.   1)  How often have you had a sensation of not emptying your bladder completely after you finish urinating?  1 - Less than 1 time in 5  2)  How often have you had to urinate again less than two hours  after you finished urinating? 0 - Not at all  3)  How often have you found you stopped and started again several times when you urinated?  1 - Less than 1 time in 5  4) How difficult have you found it to postpone urination?  0 - Not at all  5) How often have you had a weak urinary stream?  1 - Less than 1 time in 5  6) How often have you had to push or strain to begin urination?  1 - Less than 1 time in 5  7) How many times did you most typically get up to urinate from the time you went to bed until the time you got up in the morning?  3 - 3 times  Total score:  0-7 mildly symptomatic   8-19 moderately symptomatic   20-35 severely symptomatic   Functional Status Survey: Is the patient deaf or have difficulty hearing?: No Does the patient have difficulty seeing, even when wearing glasses/contacts?: No Does the patient have difficulty concentrating, remembering, or making decisions?: No Does the patient have difficulty walking or climbing stairs?: No Does the patient have difficulty dressing or bathing?: No Does the patient have difficulty doing errands alone such as visiting a doctor's office or shopping?: No  FALL RISK:    04/22/2023    8:52 AM 11/19/2022    8:40 AM 11/12/2022   10:26 AM 11/09/2021   10:29 AM 11/06/2021   11:03 AM  Fall Risk   Falls in the past year? 0 0 0 0 0  Number falls in past yr: 0 0 0 0 0  Injury with Fall? 0 0 0 0 0  Risk for fall due to : No Fall Risks No Fall Risks No Fall Risks No Fall Risks   Follow up Falls evaluation completed Falls prevention discussed Falls evaluation completed Falls evaluation completed Falls evaluation completed;Education provided;Falls prevention discussed   Depression Screen    04/22/2023    8:51 AM 11/19/2022    8:37 AM 11/12/2022   10:26 AM 11/09/2021   10:29 AM 11/06/2021   11:19 AM  Depression screen PHQ 2/9  Decreased Interest 0 0 0 0 0  Down, Depressed, Hopeless 0 0 0 0 0  PHQ - 2 Score 0 0 0 0 0  Altered sleeping 0 0  0    Tired, decreased energy 0 0  0   Change in appetite 0 0  0   Feeling bad or failure about yourself  0 0  0   Trouble concentrating 0 0  0   Moving slowly or fidgety/restless 0 0  0   Suicidal thoughts 0 0  0   PHQ-9 Score 0 0  0   Difficult doing work/chores Not difficult at all Not difficult at all  Not difficult at all       04/22/2023    8:51 AM 11/09/2021   10:29 AM 10/11/2021    9:31 AM 04/13/2021    8:52 AM  GAD 7 : Generalized Anxiety Score  Nervous, Anxious, on Edge 0 0 0 0  Control/stop worrying 0 0 0 0  Worry too much - different things 0 0 0 0  Trouble relaxing 0 0 0 0  Restless 0  0 0 0  Easily annoyed or irritable 0 0 0 0  Afraid - awful might happen 0 0 0 0  Total GAD 7 Score 0 0 0 0  Anxiety Difficulty Not difficult at all Not difficult at all Not difficult at all Not difficult at all   Advanced Directives Does patient have a HCPOA?    no If yes, name and contact information:  Does patient have a living will or MOST form?  no  Past Medical History:  Past Medical History:  Diagnosis Date   A-fib (HCC)    Atrial fibrillation with RVR (HCC)    Dupuytren's contracture    Essential hypertension 09/05/2014   Hypertension    Hypokalemia    Hypothyroidism    Malignant neoplasm of prostate (HCC) 09/05/2014   prostate,skin of the scalp   Mitral regurgitation    a. echo 03/2015: echo 03/2015: EF 50-55%, no RWMA, no sufficienct to allow for LV diastolic fxn, mild MR, LA mildly dilated at 46 mm, RV systolic fxn nl, PASP nl   Nephrolithiasis    New onset atrial fibrillation (HCC) 04/18/2015   OSA (obstructive sleep apnea) 05/11/2015   Persistent atrial fibrillation (HCC)     Surgical History:  Past Surgical History:  Procedure Laterality Date   COLONOSCOPY WITH PROPOFOL  N/A 04/18/2015   Procedure: COLONOSCOPY WITH PROPOFOL ;  Surgeon: Rogelia Copping, MD;  Location: [facility] END[facility]COPY;  Service: Endoscopy;  Laterality: N/A;   ELECTROPHYSIOLOGIC STUDY N/A 05/05/2015    Procedure: CARDIOVERSION;  Surgeon: Deatrice DELENA Cage, MD;  Location: [facility] ORS;  Service: Cardiovascular;  Laterality: N/A;   IR RADIOLOGIST EVAL & MGMT  02/18/2023   IR RADIOLOGIST EVAL & MGMT  04/17/2023   RADIOACTIVE SEED IMPLANT N/A     Medications:  Current Outpatient Medications on File Prior to Visit  Medication Sig   amiodarone  (PACERONE ) 200 MG tablet Take 1 tablet by mouth once daily   carvedilol  (COREG ) 25 MG tablet Take 25 mg by mouth 2 (two) times daily with a meal.   Cholecalciferol (VITAMIN D-3) 125 MCG (5000 UT) TABS Take 5,000 Units by mouth daily.   cyanocobalamin  (VITAMIN B12) 1000 MCG tablet Take 1,000 mcg by mouth daily.   finasteride  (PROSCAR ) 5 MG tablet Take 1 tablet (5 mg total) by mouth daily.   folic acid  (FOLVITE ) 400 MCG tablet Take 400 mcg by mouth daily.   levothyroxine  (SYNTHROID ) 75 MCG tablet Take 1 tablet (75 mcg total) by mouth daily.   Multiple Vitamin (MULTIVITAMIN) tablet Take 1 tablet by mouth daily.   olmesartan  (BENICAR ) 40 MG tablet Take 1 tablet (40 mg total) by mouth daily.   Zinc 50 MG TABS Take 50 mg by mouth daily.   No current facility-administered medications on file prior to visit.    Allergies:  No Known Allergies  Social History:  Social History   Socioeconomic History   Marital status: Married    Spouse name: [name]   Number of children: 2   Years of education: Not on file   Highest education level: Associate degree: academic program  Occupational History   Occupation: retired     Comment: acupuncturist  Tobacco Use   Smoking status: Former    Current packs/day: 0.00    Average packs/day: 1 pack/day for 30.5 years (30.5 ttl pk-yrs)    Types: Cigarettes    Start date: 1966    Quit date: 09/05/1994    Years since quitting: 28.6   Smokeless tobacco: Former    Types:  Cicero    Quit date: 09/04/2004  Vaping Use   Vaping status: Never Used  Substance and Sexual Activity   Alcohol use: No   Drug use: No   Sexual  activity: Not Currently  Other Topics Concern   Not on file  Social History Narrative   Plays racket ball twice a week, works outside    [employer] Strain: Low Risk  (11/19/2022)   Overall Financial Resource Strain (CARDIA)    Difficulty of Paying Living Expenses: Not hard at all  Food Insecurity: No Food Insecurity (11/19/2022)   Hunger Vital Sign    Worried About Running Out of Food in the Last Year: Never true    Ran Out of Food in the Last Year: Never true  Transportation Needs: No Transportation Needs (11/19/2022)   PRAPARE - Administrator, Civil Service (Medical): No    Lack of Transportation (Non-Medical): No  Physical Activity: Sufficiently Active (11/19/2022)   Exercise Vital Sign    Days of Exercise per Week: 2 days    Minutes of Exercise per Session: 120 min  Stress: No Stress Concern Present (11/19/2022)   [employer] of Occupational Health - Occupational Stress Questionnaire    Feeling of Stress : Not at all  Social Connections: Socially Integrated (11/19/2022)   Social Connection and Isolation Panel [NHANES]    Frequency of Communication with Friends and Family: Three times a week    Frequency of Social Gatherings with Friends and Family: More than three times a week    Attends Religious Services: More than 4 times per year    Active Member of [employer] or Organizations: Yes    Attends Banker Meetings: Never    Marital Status: Married  Catering Manager Violence: Not At Risk (11/19/2022)   Humiliation, Afraid, Rape, and Kick questionnaire    Fear of Current or Ex-Partner: No    Emotionally Abused: No    Physically Abused: No    Sexually Abused: No   Social History   Tobacco Use  Smoking Status Former   Current packs/day: 0.00   Average packs/day: 1 pack/day for 30.5 years (30.5 ttl pk-yrs)   Types: Cigarettes   Start date: 1966   Quit date: 09/05/1994   Years since quitting: 28.6  Smokeless Tobacco Former    Types: Chew   Quit date: 09/04/2004   Social History   Substance and Sexual Activity  Alcohol Use No    Family History:  Family History  Problem Relation Age of Onset   Heart attack Mother    Heart disease Father    Arthritis Maternal Grandmother     Past medical history, surgical history, medications, allergies, family history and social history reviewed with patient today and changes made to appropriate areas of the chart.   ROS All other ROS negative except what is listed above and in the HPI.      Objective:    BP 132/80 (BP Location: Left Arm, Patient Position: Sitting, Cuff Size: Normal)   Pulse 61   Temp 97.7 F (36.5 C) (Oral)   Wt 235 lb 6.4 oz (106.8 kg)   SpO2 99%   BMI 31.06 kg/m   Wt Readings from Last 3 Encounters:  04/22/23 235 lb 6.4 oz (106.8 kg)  04/02/23 236 lb 2 oz (107.1 kg)  02/28/23 235 lb 2 oz (106.7 kg)    Physical Exam Vitals and nursing note reviewed.  Constitutional:  General: He is awake. He is not in acute distress.    Appearance: He is well-developed and well-groomed. He is not ill-appearing or toxic-appearing.  HENT:     Head: Normocephalic and atraumatic.     Right Ear: Hearing, tympanic membrane, ear canal and external ear normal. No drainage.     Left Ear: Hearing, tympanic membrane, ear canal and external ear normal. No drainage.     Nose: Nose normal.     Mouth/Throat:     Pharynx: Uvula midline.  Eyes:     General: Lids are normal.        Right eye: No discharge.        Left eye: No discharge.     Extraocular Movements: Extraocular movements intact.     Conjunctiva/sclera: Conjunctivae normal.     Pupils: Pupils are equal, round, and reactive to light.     Visual Fields: Right eye visual fields normal and left eye visual fields normal.  Neck:     Thyroid : No thyromegaly.     Vascular: No carotid bruit or JVD.     Trachea: Trachea normal.  Cardiovascular:     Rate and Rhythm: Normal rate and regular rhythm.      Heart sounds: Normal heart sounds, S1 normal and S2 normal. No murmur heard.    No gallop.  Pulmonary:     Effort: Pulmonary effort is normal. No accessory muscle usage or respiratory distress.     Breath sounds: Normal breath sounds.  Abdominal:     General: Bowel sounds are normal.     Palpations: Abdomen is soft. There is no hepatomegaly or splenomegaly.     Tenderness: There is no abdominal tenderness.  Musculoskeletal:        General: Normal range of motion.     Cervical back: Normal range of motion and neck supple.     Right lower leg: No edema.     Left lower leg: No edema.  Lymphadenopathy:     Head:     Right side of head: No submental, submandibular, tonsillar, preauricular or posterior auricular adenopathy.     Left side of head: No submental, submandibular, tonsillar, preauricular or posterior auricular adenopathy.     Cervical: No cervical adenopathy.  Skin:    General: Skin is warm and dry.     Capillary Refill: Capillary refill takes less than 2 seconds.     Findings: No rash.  Neurological:     Mental Status: He is alert and oriented to person, place, and time.     Gait: Gait is intact.     Deep Tendon Reflexes: Reflexes are normal and symmetric.     Reflex Scores:      Brachioradialis reflexes are 2+ on the right side and 2+ on the left side.      Patellar reflexes are 2+ on the right side and 2+ on the left side. Psychiatric:        Attention and Perception: Attention normal.        Mood and Affect: Mood normal.        Speech: Speech normal.        Behavior: Behavior normal. Behavior is cooperative.        Thought Content: Thought content normal.        Cognition and Memory: Cognition normal.       11/19/2022    8:41 AM 11/06/2021   11:05 AM 10/02/2020    9:58 AM 10/01/2019    9:51 AM 10/21/2018  8:24 AM  6CIT Screen  What Year? 0 points 0 points 0 points 0 points 0 points  What month? 0 points 0 points 0 points 0 points 0 points  What time? 0 points 0  points 0 points 0 points 0 points  Count back from 20 0 points 0 points 0 points 0 points 0 points  Months in reverse 0 points 0 points 0 points 2 points 0 points  Repeat phrase 0 points 0 points 0 points 0 points 0 points  Total Score 0 points 0 points 0 points 2 points 0 points    Results for orders placed or performed in visit on 04/02/23  T4, free   Collection Time: 04/02/23  9:27 AM  Result Value Ref Range   Free T4 1.87 (H) 0.82 - 1.77 ng/dL  TSH   Collection Time: 04/02/23  9:27 AM  Result Value Ref Range   TSH 5.450 (H) 0.450 - 4.500 uIU/mL  Comprehensive metabolic panel   Collection Time: 04/02/23  9:27 AM  Result Value Ref Range   Glucose 91 70 - 99 mg/dL   BUN 17 8 - 27 mg/dL   Creatinine, Ser 8.72 0.76 - 1.27 mg/dL   eGFR 58 (L) >40 fO/fpw/8.26   BUN/Creatinine Ratio 13 10 - 24   Sodium 146 (H) 134 - 144 mmol/L   Potassium 3.9 3.5 - 5.2 mmol/L   Chloride 108 (H) 96 - 106 mmol/L   CO2 23 20 - 29 mmol/L   Calcium 8.8 8.6 - 10.2 mg/dL   Total Protein 5.7 (L) 6.0 - 8.5 g/dL   Albumin 4.2 3.8 - 4.8 g/dL   Globulin, Total 1.5 1.5 - 4.5 g/dL   Bilirubin Total 0.9 0.0 - 1.2 mg/dL   Alkaline Phosphatase 102 44 - 121 IU/L   AST 21 0 - 40 IU/L   ALT 19 0 - 44 IU/L  CBC   Collection Time: 04/02/23  9:27 AM  Result Value Ref Range   WBC 3.7 3.4 - 10.8 x10E3/uL   RBC 3.96 (L) 4.14 - 5.80 x10E6/uL   Hemoglobin 12.3 (L) 13.0 - 17.7 g/dL   Hematocrit 61.1 62.4 - 51.0 %   MCV 98 (H) 79 - 97 fL   MCH 31.1 26.6 - 33.0 pg   MCHC 31.7 31.5 - 35.7 g/dL   RDW 84.7 88.3 - 84.5 %   Platelets 90 (LL) 150 - 450 x10E3/uL   Hematology Comments: Note:       Assessment & Plan:   Problem List Items Addressed This Visit       Cardiovascular and Mediastinum   Essential hypertension (Chronic)   Chronic, improving.  Continue Olmesartan  to 40 MG daily for BP and kidney protection with CKD and Coreg , and further adjust regimen as needed in future.  Educated him on this medication use  and side effects to monitor for.  Recommend checking BP three mornings a week and documenting + focus on DASH diet.  Continue collaboration with cardiology.  LABS: CBC, CMP, TSH.        Relevant Medications   carvedilol  (COREG ) 25 MG tablet   Other Relevant Orders   CBC with Differential/Platelet   Comprehensive metabolic panel   Aortic atherosclerosis (HCC)   Ongoing.  Seen on CT scan 11/02/20 -- have recommended statin initiation for prevention and reviewed imaging and ASCVD scoring with him.  He wishes to think about this and repeat lipid panel.      Relevant Medications   carvedilol  (COREG ) 25  MG tablet   Other Relevant Orders   Comprehensive metabolic panel   Lipid Panel w/o Chol/HDL Ratio   Atrial fibrillation with RVR (HCC)   Chronic, stable with rate controlled.  Continue current medication regimen and collaboration with caridology.  Plan is for Watchman in future once hematuria under control.      Relevant Medications   carvedilol  (COREG ) 25 MG tablet   Other Relevant Orders   Comprehensive metabolic panel     Respiratory   OSA (obstructive sleep apnea)   Ongoing and stable.  Continue use of CPAP 100% of the time.        Endocrine   Hypothyroidism (Chronic)   Chronic, ongoing.  Continue current medication regimen and adjust as needed. Thyroid  labs today, recent TSH elevated.  Monitor closely as is also taking Amiodarone .      Relevant Medications   carvedilol  (COREG ) 25 MG tablet   Other Relevant Orders   TSH   T4, free     Genitourinary   Asymptomatic microscopic hematuria   Followed by urology, recent note reviewed.  Continue this collaboration.      CKD (chronic kidney disease), stage III (HCC) - Primary   Chronic, noted on past labs on and off since 2019 -- recent improved.  Monitor closely and recommend adequate hydration at home + avoid NSAIDs.  CMP today.  Refer to nephrology as needed.  Continue Olmesartan  for BP, which may also offer benefit to  kidneys.      Relevant Orders   CBC with Differential/Platelet   Comprehensive metabolic panel     Hematopoietic and Hemostatic   Other thrombophilia (HCC)   On Eliquis  and has A-Fib.  Continue to monitor for any bleeding or wounds.  CBC annually.      Relevant Orders   CBC with Differential/Platelet   Thrombocytopenia (HCC)   Ongoing.  Noted past labs for years ranging from 90 to 146.  Monitor closely and send to hematology as needed.  We discussed at length things to avoid and things to add into diet daily.  No Ibuprofen or ASA.      Relevant Orders   CBC with Differential/Platelet   Ferritin   Iron     Other   BMI 29.0-29.9,adult   BPH associated with nocturia   AUA 7, history of prostate CA.  Continue Finasteride  as tolerating well. Continue collaboration with urology, recent note reviewed.  He wishes PSA check.      History of prostate cancer   History of with seed placement.  Continue collaboration with urology, recent note reviewed.      Relevant Orders   PSA   Hyperlipidemia LDL goal <70   Ongoing.  No current statin, discussed at length current levels and ASCVD 36.1%.  He wishes to further discuss with cardiology.  If statin initiated would need to be low dose and assess closely as takes Amiodarone .  Lipid check today.      Relevant Medications   carvedilol  (COREG ) 25 MG tablet   Other Relevant Orders   Comprehensive metabolic panel   Lipid Panel w/o Chol/HDL Ratio   Other Visit Diagnoses       Encounter for annual physical exam       Annual physical today with labs and health maintenance reviewed, discussed with patient.       Discussed aspirin  prophylaxis for myocardial infarction prevention and decision was it was not indicated  LABORATORY TESTING:  Health maintenance labs ordered today as discussed above.  The natural history of prostate cancer and ongoing controversy regarding screening and potential treatment outcomes of prostate cancer has  been discussed with the patient. The meaning of a false positive PSA and a false negative PSA has been discussed. He indicates understanding of the limitations of this screening test and wishes to proceed with screening PSA testing due to history.   IMMUNIZATIONS:   - Tdap: Tetanus vaccination status reviewed: last tetanus booster within 10 years. - Influenza: Up to date - Pneumovax: Up to date - Prevnar: Up to date - Zostavax vaccine: Refuses - Covid: has had a few  SCREENING: - Colonoscopy: Not applicable  Discussed with patient purpose of the colonoscopy is to detect colon cancer at curable precancerous or early stages   - AAA Screening: Not applicable  -Hearing Test: Not applicable  -Spirometry: Not applicable   PATIENT COUNSELING:    Sexuality: Discussed sexually transmitted diseases, partner selection, use of condoms, avoidance of unintended pregnancy  and contraceptive alternatives.   Advised to avoid cigarette smoking.  I discussed with the patient that most people either abstain from alcohol or drink within safe limits (<=14/week and <=4 drinks/occasion for males, <=7/weeks and <= 3 drinks/occasion for females) and that the risk for alcohol disorders and other health effects rises proportionally with the number of drinks per week and how often a drinker exceeds daily limits.  Discussed cessation/primary prevention of drug use and availability of treatment for abuse.   Diet: Encouraged to adjust caloric intake to maintain  or achieve ideal body weight, to reduce intake of dietary saturated fat and total fat, to limit sodium intake by avoiding high sodium foods and not adding table salt, and to maintain adequate dietary potassium and calcium preferably from fresh fruits, vegetables, and low-fat dairy products.    Stressed the importance of regular exercise  Injury prevention: Discussed safety belts, safety helmets, smoke detector, smoking near bedding or upholstery.   Dental  health: Discussed importance of regular tooth brushing, flossing, and dental visits.   Follow up plan: NEXT PREVENTATIVE PHYSICAL DUE IN 1 YEAR. Return in about 6 months (around 10/20/2023) for HTN/HLD, BPH, HYPOTHYROID, CKD, A-FIB.

## 2023-04-22 NOTE — Assessment & Plan Note (Signed)
 Chronic, improving.  Continue Olmesartan  to 40 MG daily for BP and kidney protection with CKD and Coreg , and further adjust regimen as needed in future.  Educated him on this medication use and side effects to monitor for.  Recommend checking BP three mornings a week and documenting + focus on DASH diet.  Continue collaboration with cardiology.  LABS: CBC, CMP, TSH.

## 2023-04-22 NOTE — Assessment & Plan Note (Signed)
AUA 7, history of prostate CA.  Continue Finasteride as tolerating well. Continue collaboration with urology, recent note reviewed.  He wishes PSA check.

## 2023-04-23 ENCOUNTER — Other Ambulatory Visit: Payer: Self-pay | Admitting: Nurse Practitioner

## 2023-04-23 ENCOUNTER — Encounter: Payer: Self-pay | Admitting: Nurse Practitioner

## 2023-04-23 LAB — COMPREHENSIVE METABOLIC PANEL
ALT: 18 [IU]/L (ref 0–44)
AST: 20 [IU]/L (ref 0–40)
Albumin: 4.1 g/dL (ref 3.8–4.8)
Alkaline Phosphatase: 91 [IU]/L (ref 44–121)
BUN/Creatinine Ratio: 12 (ref 10–24)
BUN: 16 mg/dL (ref 8–27)
Bilirubin Total: 1.2 mg/dL (ref 0.0–1.2)
CO2: 23 mmol/L (ref 20–29)
Calcium: 8.9 mg/dL (ref 8.6–10.2)
Chloride: 106 mmol/L (ref 96–106)
Creatinine, Ser: 1.32 mg/dL — ABNORMAL HIGH (ref 0.76–1.27)
Globulin, Total: 1.6 g/dL (ref 1.5–4.5)
Glucose: 97 mg/dL (ref 70–99)
Potassium: 4.1 mmol/L (ref 3.5–5.2)
Sodium: 142 mmol/L (ref 134–144)
Total Protein: 5.7 g/dL — ABNORMAL LOW (ref 6.0–8.5)
eGFR: 55 mL/min/{1.73_m2} — ABNORMAL LOW (ref >59)

## 2023-04-23 LAB — CBC WITH DIFFERENTIAL/PLATELET
Basophils Absolute: 0 10*3/uL (ref 0.0–0.2)
Basos: 1 %
EOS (ABSOLUTE): 0 10*3/uL (ref 0.0–0.4)
Eos: 1 %
Hematocrit: 37.5 % (ref 37.5–51.0)
Hemoglobin: 11.6 g/dL — ABNORMAL LOW (ref 13.0–17.7)
Immature Grans (Abs): 0 10*3/uL (ref 0.0–0.1)
Immature Granulocytes: 0 %
Lymphocytes Absolute: 1.2 10*3/uL (ref 0.7–3.1)
Lymphs: 43 %
MCH: 30.7 pg (ref 26.6–33.0)
MCHC: 30.9 g/dL — ABNORMAL LOW (ref 31.5–35.7)
MCV: 99 fL — ABNORMAL HIGH (ref 79–97)
Monocytes Absolute: 0.3 10*3/uL (ref 0.1–0.9)
Monocytes: 11 %
Neutrophils Absolute: 1.3 10*3/uL — ABNORMAL LOW (ref 1.4–7.0)
Neutrophils: 44 %
Platelets: 114 10*3/uL — ABNORMAL LOW (ref 150–450)
RBC: 3.78 x10E6/uL — ABNORMAL LOW (ref 4.14–5.80)
RDW: 15.3 % (ref 11.6–15.4)
WBC: 2.8 10*3/uL — ABNORMAL LOW (ref 3.4–10.8)

## 2023-04-23 LAB — PSA: Prostate Specific Ag, Serum: <0.1 ng/mL (ref 0.0–4.0)

## 2023-04-23 LAB — LIPID PANEL W/O CHOL/HDL RATIO
Cholesterol, Total: 141 mg/dL (ref 100–199)
HDL: 43 mg/dL (ref >39)
LDL Chol Calc (NIH): 85 mg/dL (ref 0–99)
Triglycerides: 65 mg/dL (ref 0–149)
VLDL Cholesterol Cal: 13 mg/dL (ref 5–40)

## 2023-04-23 LAB — TSH: TSH: 5.04 u[IU]/mL — ABNORMAL HIGH (ref 0.450–4.500)

## 2023-04-23 LAB — IRON: Iron: 180 ug/dL — ABNORMAL HIGH (ref 38–169)

## 2023-04-23 LAB — FERRITIN: Ferritin: 69 ng/mL (ref 30–400)

## 2023-04-23 LAB — T4, FREE: Free T4: 1.89 ng/dL — ABNORMAL HIGH (ref 0.82–1.77)

## 2023-04-23 MED ORDER — LEVOTHYROXINE SODIUM 100 MCG PO TABS: 100.0000 ug | ORAL_TABLET | Freq: Every day | ORAL | 3 refills | Status: DC

## 2023-04-23 NOTE — Progress Notes (Signed)
 Good day, [name] [name] came to see me yesterday for physical.  I did recheck CBC which continues to show low H/H, but platelets did trend up a little.  White blood cell count was low this check, which is new for him.  Iron was a little high, so I told him if taking supplement to cut back to every other day.  His thyroid  labs remain a little elevated and I have adjusted his Levothyroxine  dose.  Wanted to update you both on current labs and changes since I now there are upcoming procedures and hope for Watchman in future.  Have a great day!!

## 2023-04-23 NOTE — Progress Notes (Signed)
 Contacted via MyChart   Good day [name] [name], your labs have returned: - CBC shows ongoing anemia with hemoglobin and hematocrit on lower side, in fact a slight trend down.  Platelets remain low but have trended up, good news. White blood cell count is a little low this check, which is new.  I am going to share with other providers and see if a hematology referral may be of value.   - Kidney function, creatinine and eGFR, continue to show Stage 3a kidney disease with no worsening.  We will continue to monitor.  Ensure good water intake and no Ibuprofen use. - Thyroid  labs continue to show mild elevation in TSH and Free T4, often the T4 will trend down if TSH elevated.  I am going to increase your Levothyroxine  to 100 MCG, stop 75 MCG dosing. - Iron is a little too high, if taking supplement cut back to every other day.  - Remainder of labs stable. Any questions? Keep being amazing!!  Thank you for allowing me to participate in your care.  I appreciate you. Kindest regards, Aqeel Norgaard

## 2023-04-28 ENCOUNTER — Other Ambulatory Visit: Payer: Self-pay | Admitting: Cardiovascular Disease

## 2023-05-05 ENCOUNTER — Other Ambulatory Visit: Payer: Self-pay | Admitting: Interventional Radiology

## 2023-05-05 DIAGNOSIS — Z01818 Encounter for other preprocedural examination: Secondary | ICD-10-CM

## 2023-05-05 MED ORDER — DEXAMETHASONE SODIUM PHOSPHATE 10 MG/ML IJ SOLN: 8.0000 mg | INTRAMUSCULAR | Status: AC

## 2023-05-06 ENCOUNTER — Other Ambulatory Visit: Payer: Self-pay

## 2023-05-06 ENCOUNTER — Encounter: Payer: Self-pay | Admitting: Radiology

## 2023-05-06 ENCOUNTER — Ambulatory Visit
Admission: RE | Admit: 2023-05-06 | Discharge: 2023-05-06 | Disposition: A | Discharge status: Home / Self Care | Payer: PPO | Source: Ambulatory Visit | Attending: Interventional Radiology | Admitting: Interventional Radiology

## 2023-05-06 VITALS — BP 163/82 | HR 70 | Temp 97.9°F | Resp 16 | Ht 73.0 in | Wt 238.7 lb

## 2023-05-06 DIAGNOSIS — R310 Gross hematuria: Secondary | ICD-10-CM

## 2023-05-06 DIAGNOSIS — Z87891 Personal history of nicotine dependence: Secondary | ICD-10-CM | POA: Diagnosis not present

## 2023-05-06 DIAGNOSIS — N189 Chronic kidney disease, unspecified: Secondary | ICD-10-CM | POA: Insufficient documentation

## 2023-05-06 DIAGNOSIS — E039 Hypothyroidism, unspecified: Secondary | ICD-10-CM | POA: Insufficient documentation

## 2023-05-06 DIAGNOSIS — Z01818 Encounter for other preprocedural examination: Secondary | ICD-10-CM

## 2023-05-06 DIAGNOSIS — Z8546 Personal history of malignant neoplasm of prostate: Secondary | ICD-10-CM | POA: Insufficient documentation

## 2023-05-06 DIAGNOSIS — I129 Hypertensive chronic kidney disease with stage 1 through stage 4 chronic kidney disease, or unspecified chronic kidney disease: Secondary | ICD-10-CM | POA: Diagnosis not present

## 2023-05-06 DIAGNOSIS — N401 Enlarged prostate with lower urinary tract symptoms: Secondary | ICD-10-CM

## 2023-05-06 DIAGNOSIS — I4819 Other persistent atrial fibrillation: Secondary | ICD-10-CM | POA: Insufficient documentation

## 2023-05-06 DIAGNOSIS — G4733 Obstructive sleep apnea (adult) (pediatric): Secondary | ICD-10-CM | POA: Insufficient documentation

## 2023-05-06 DIAGNOSIS — R319 Hematuria, unspecified: Secondary | ICD-10-CM | POA: Diagnosis not present

## 2023-05-06 HISTORY — PX: IR EMBO ARTERIAL NOT HEMORR HEMANG INC GUIDE ROADMAPPING: IMG5448

## 2023-05-06 HISTORY — PX: IR EMBO TUMOR ORGAN ISCHEMIA INFARCT INC GUIDE ROADMAPPING: IMG5449

## 2023-05-06 LAB — BASIC METABOLIC PANEL
Anion gap: 9 (ref 5–15)
BUN: 24 mg/dL — ABNORMAL HIGH (ref 8–23)
CO2: 24 mmol/L (ref 22–32)
Calcium: 8.7 mg/dL — ABNORMAL LOW (ref 8.9–10.3)
Chloride: 108 mmol/L (ref 98–111)
Creatinine, Ser: 1.23 mg/dL (ref 0.61–1.24)
GFR, Estimated: >60 mL/min (ref >60)
Glucose, Bld: 117 mg/dL — ABNORMAL HIGH (ref 70–99)
Potassium: 3.9 mmol/L (ref 3.5–5.1)
Sodium: 141 mmol/L (ref 135–145)

## 2023-05-06 LAB — CBC
HCT: 34.2 % — ABNORMAL LOW (ref 39.0–52.0)
Hemoglobin: 11.1 g/dL — ABNORMAL LOW (ref 13.0–17.0)
MCH: 31.1 pg (ref 26.0–34.0)
MCHC: 32.5 g/dL (ref 30.0–36.0)
MCV: 95.8 fL (ref 80.0–100.0)
Platelets: 106 10*3/uL — ABNORMAL LOW (ref 150–400)
RBC: 3.57 MIL/uL — ABNORMAL LOW (ref 4.22–5.81)
RDW: 17.2 % — ABNORMAL HIGH (ref 11.5–15.5)
WBC: 2.7 10*3/uL — ABNORMAL LOW (ref 4.0–10.5)
nRBC: 0 % (ref 0.0–0.2)

## 2023-05-06 LAB — PROTIME-INR
INR: 1.2 (ref 0.8–1.2)
Prothrombin Time: 15.3 s — ABNORMAL HIGH (ref 11.4–15.2)

## 2023-05-06 MED ORDER — NITROGLYCERIN 1 MG/10 ML FOR IR/CATH LAB
INTRA_ARTERIAL | Status: AC | PRN
  Administered 2023-05-06: 100 ug via INTRA_ARTERIAL

## 2023-05-06 MED ORDER — MIDAZOLAM HCL 2 MG/2ML IJ SOLN
INTRAMUSCULAR | Status: AC | PRN
  Administered 2023-05-06: 1 mg via INTRAVENOUS
  Administered 2023-05-06 (×2): .5 mg via INTRAVENOUS

## 2023-05-06 MED ORDER — IOHEXOL 300 MG/ML  SOLN
200.0000 mL | Freq: Once | INTRAMUSCULAR | Status: AC | PRN
  Administered 2023-05-06: 200 mL via INTRA_ARTERIAL

## 2023-05-06 MED ORDER — NITROGLYCERIN 1 MG/10 ML FOR IR/CATH LAB
INTRA_ARTERIAL | Status: AC
  Filled 2023-05-06: qty 10

## 2023-05-06 MED ORDER — MIDAZOLAM HCL 2 MG/2ML IJ SOLN
INTRAMUSCULAR | Status: AC
  Filled 2023-05-06: qty 2

## 2023-05-06 MED ORDER — SOLIFENACIN SUCCINATE 5 MG PO TABS: 5.0000 mg | ORAL_TABLET | Freq: Every day | ORAL | 0 refills | Status: AC

## 2023-05-06 MED ORDER — FENTANYL CITRATE (PF) 100 MCG/2ML IJ SOLN
INTRAMUSCULAR | Status: AC | PRN
  Administered 2023-05-06: 25 ug via INTRAVENOUS

## 2023-05-06 MED ORDER — CIPROFLOXACIN HCL 500 MG PO TABS
500.0000 mg | ORAL_TABLET | ORAL | Status: AC
  Administered 2023-05-06: 500 mg via ORAL
  Filled 2023-05-06: qty 1

## 2023-05-06 MED ORDER — MIDAZOLAM HCL 2 MG/2ML IJ SOLN
INTRAMUSCULAR | Status: AC | PRN
  Administered 2023-05-06: 1 mg via INTRAVENOUS

## 2023-05-06 MED ORDER — CIPROFLOXACIN HCL 500 MG PO TABS: 500.0000 mg | ORAL_TABLET | Freq: Two times a day (BID) | ORAL | 0 refills | Status: AC

## 2023-05-06 MED ORDER — PHENAZOPYRIDINE HCL 100 MG PO TABS: 100.0000 mg | ORAL_TABLET | Freq: Three times a day (TID) | ORAL | 0 refills | Status: AC

## 2023-05-06 MED ORDER — DEXAMETHASONE SODIUM PHOSPHATE 10 MG/ML IJ SOLN
8.0000 mg | INTRAMUSCULAR | Status: AC
  Administered 2023-05-06: 8 mg via INTRAVENOUS
  Filled 2023-05-06: qty 0.8

## 2023-05-06 MED ORDER — SODIUM CHLORIDE 0.9 % IV SOLN: INTRAVENOUS | Status: DC

## 2023-05-06 MED ORDER — LIDOCAINE HCL 1 % IJ SOLN
INTRAMUSCULAR | Status: AC
Start: 2023-05-06 — End: ?
  Filled 2023-05-06: qty 20

## 2023-05-06 MED ORDER — LIDOCAINE HCL 1 % IJ SOLN
5.0000 mL | Freq: Once | INTRAMUSCULAR | Status: AC
  Administered 2023-05-06: 5 mL via INTRADERMAL

## 2023-05-06 MED ORDER — FENTANYL CITRATE (PF) 100 MCG/2ML IJ SOLN
INTRAMUSCULAR | Status: AC
  Filled 2023-05-06: qty 2

## 2023-05-06 MED ORDER — FENTANYL CITRATE (PF) 100 MCG/2ML IJ SOLN
INTRAMUSCULAR | Status: AC | PRN
  Administered 2023-05-06: 25 ug via INTRAVENOUS
  Administered 2023-05-06: 50 ug via INTRAVENOUS
  Administered 2023-05-06: 25 ug via INTRAVENOUS

## 2023-05-06 MED ORDER — NAPROXEN 500 MG PO TABS: 500.0000 mg | ORAL_TABLET | Freq: Two times a day (BID) | ORAL | 0 refills | Status: AC

## 2023-05-06 NOTE — Procedures (Signed)
Interventional Radiology Procedure Note  Procedure: Pelvic angiography and prostate artery embolization  Complications: None  Estimated Blood Loss: None  Recommendations: - Bedrest x 1 hr - DC home   Signed,  Sterling Big, MD

## 2023-05-06 NOTE — Discharge Instructions (Signed)

## 2023-05-06 NOTE — Progress Notes (Signed)
Patient clinically stable post IR PAE per Dr Archer Asa, tolerated well. Vitals stable pre and post procedure. Received Versed 3 mg along with Fentanyl 125 mcg IV for procedure. Right groin without bleeding nor hematoma. Report given to [facility] RN post procedure/specials/15.

## 2023-05-06 NOTE — H&P (Signed)
Chief Complaint: Patient was seen in consultation today for persistent hematuria   Procedure: Prostate Artery Embolization  Referring Physician(s): Sterling Big  Supervising Physician: Malachy Moan  Patient Status: [facility] - Out-pt  History of Present Illness: [name] [name] is a 78 y.o. 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 PSA has remained WNL since treatment. Patient seen in clinic by Dr. Vella Redhead on 04/17/23 d/t several episodes of intermittent hematuria including the passage of small clots. In October 2024, patient had a more significant episode of bleeding and clot formation which resulted in urinary retention requiring Foley catheter placement with bladder irrigation. Patient underwent cystoscopy with his Urologist Dr. 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 Patient had been holding Eliquis since October 2024, initially noting improvement in intermittent hematuria; however, on worsening symptoms, he was referred to IR for possible PAE which was approved by Dr. Vella Redhead following evaluation in clinic.   Patient is currently resting in bed. States that he has been experiencing hematuria daily. Denies any pain with urination, fevers/chills, abdominal pain. NPO since midnight. Continues to hold eliquis.   Code Status: Full Code  Past Medical History:  Diagnosis Date   A-fib [facility])    Atrial fibrillation with RVR (HCC)    Dupuytren's contracture    Essential hypertension 09/05/2014   Hypertension    Hypokalemia    Hypothyroidism    Malignant neoplasm of prostate (HCC) 09/05/2014   prostate,skin of the scalp   Mitral regurgitation    a. echo 03/2015: echo 03/2015: EF 50-55%, no RWMA, no sufficienct to allow for LV diastolic fxn, mild MR, LA mildly dilated at 46 mm, RV systolic  fxn nl, PASP nl   Nephrolithiasis    New onset atrial fibrillation (HCC) 04/18/2015   OSA (obstructive sleep apnea) 05/11/2015   Persistent atrial fibrillation (HCC)     Past Surgical History:  Procedure Laterality Date   COLONOSCOPY WITH PROPOFOL N/A 04/18/2015   Procedure: COLONOSCOPY WITH PROPOFOL;  Surgeon: Midge Minium, MD;  Location: [facility] ENDOSCOPY;  Service: Endoscopy;  Laterality: N/A;   ELECTROPHYSIOLOGIC STUDY N/A 05/05/2015   Procedure: CARDIOVERSION;  Surgeon: Iran Ouch, MD;  Location: [facility] ORS;  Service: Cardiovascular;  Laterality: N/A;   IR RADIOLOGIST EVAL & MGMT  02/18/2023   IR RADIOLOGIST EVAL & MGMT  04/17/2023   RADIOACTIVE SEED IMPLANT N/A     Allergies: Patient has no known allergies.  Medications: Prior to Admission medications   Medication Sig Start Date End Date Taking? Authorizing Provider  amiodarone (PACERONE) 200 MG tablet Take 1 tablet by mouth once daily 04/28/23   Antonieta Iba, MD  carvedilol (COREG) 25 MG tablet Take 25 mg by mouth 2 (two) times daily with a meal.    [provider]  Cholecalciferol (VITAMIN D-3) 125 MCG (5000 UT) TABS Take 5,000 Units by mouth daily.    [provider]  cyanocobalamin (VITAMIN B12) 1000 MCG tablet Take 1,000 mcg by mouth daily.    [provider]  finasteride (PROSCAR) 5 MG tablet Take 1 tablet (5 mg total) by mouth daily. 10/15/22   Cannady, Corrie Dandy T, NP  folic acid (FOLVITE) 400 MCG tablet Take 400 mcg by mouth daily.    [provider]  levothyroxine (SYNTHROID) 100 MCG tablet Take 1 tablet (100 mcg total) by mouth daily. 04/23/23   Marjie Skiff, NP  Multiple Vitamin (MULTIVITAMIN) tablet Take 1 tablet by mouth daily.    [provider]  olmesartan (BENICAR) 40 MG tablet Take 1 tablet (40 mg total) by mouth daily. 10/15/22   Cannady, Corrie Dandy T, NP  Zinc 50 MG TABS Take 50 mg by mouth daily.    [provider]     Family History  Problem Relation Age  of Onset   Heart attack Mother    Heart disease Father    Arthritis Maternal Grandmother     Social History   Socioeconomic History   Marital status: Married    Spouse name: Rene Kocher   Number of children: 2   Years of education: Not on file   Highest education level: Associate degree: academic program  Occupational History   Occupation: retired     Comment: Acupuncturist  Tobacco Use   Smoking status: Former    Current packs/day: 0.00    Average packs/day: 1 pack/day for 30.5 years (30.5 ttl pk-yrs)    Types: Cigarettes    Start date: 1966    Quit date: 09/05/1994    Years since quitting: 28.6   Smokeless tobacco: Former    Types: Chew    Quit date: 09/04/2004  Vaping Use   Vaping status: Never Used  Substance and Sexual Activity   Alcohol use: No   Drug use: No   Sexual activity: Not Currently  Other Topics Concern   Not on file  Social History Narrative   Plays racket ball twice a week, works outside. Indoor pets, dog and cat.    Social Drivers of Corporate investment banker Strain: Low Risk  (11/19/2022)   Overall Financial Resource Strain (CARDIA)    Difficulty of Paying Living Expenses: Not hard at all  Food Insecurity: No Food Insecurity (11/19/2022)   Hunger Vital Sign    Worried About Running Out of Food in the Last Year: Never true    Ran Out of Food in the Last Year: Never true  Transportation Needs: No Transportation Needs (11/19/2022)   PRAPARE - Administrator, Civil Service (Medical): No    Lack of Transportation (Non-Medical): No  Physical Activity: Sufficiently Active (11/19/2022)   Exercise Vital Sign    Days of Exercise per Week: 2 days    Minutes of Exercise per Session: 120 min  Stress: No Stress Concern Present (11/19/2022)   [employer] of Occupational Health - Occupational Stress Questionnaire    Feeling of Stress : Not at all  Social Connections: Socially Integrated (11/19/2022)   Social Connection and Isolation Panel [NHANES]     Frequency of Communication with Friends and Family: Three times a week    Frequency of Social Gatherings with Friends and Family: More than three times a week    Attends Religious Services: More than 4 times per year    Active Member of [employer] or Organizations: Yes    Attends Banker Meetings: Never    Marital Status: Married    Review of Systems  Genitourinary:  Positive for hematuria.  Denies any N/V, chest pain, shortness of breath, fevers/chills. All other ROS negative.  Vital Signs:   Today's Vitals   05/06/23 1006  BP: (!) 181/109  Pulse: 72  Resp: 12  Temp: 97.9 F (36.6 C)  TempSrc: Oral  SpO2: 94%  Weight: 238 lb 11.2 oz (108.3 kg)  Height: 6\' 1"  (1.854 m)  PainSc: 0-No pain   Body mass index is 31.49 kg/m.  Physical Exam Vitals reviewed.  Constitutional:      Appearance: Normal appearance.  HENT:     Head: Normocephalic and atraumatic.     Mouth/Throat:     Mouth: Mucous membranes are moist.     Pharynx: Oropharynx is clear.  Cardiovascular:     Rate and Rhythm: Normal rate.  Pulmonary:     Effort: Pulmonary effort is normal.  Abdominal:     General: Abdomen is flat.     Palpations: Abdomen is soft.  Musculoskeletal:        General: Normal range of motion.     Cervical back: Normal range of motion.  Skin:    General: Skin is warm.  Neurological:     General: No focal deficit present.     Mental Status: He is alert and oriented to person, place, and time. Mental status is at baseline.  Psychiatric:        Mood and Affect: Mood normal.        Behavior: Behavior normal.        Judgment: Judgment normal.     Imaging: IR Radiologist Eval & Mgmt Result Date: 04/17/2023 EXAM: ESTABLISHED PATIENT OFFICE VISIT CHIEF COMPLAINT: SEE EPIC NOTE HISTORY OF PRESENT ILLNESS: SEE EPIC NOTE REVIEW OF SYSTEMS: SEE EPIC NOTE PHYSICAL EXAMINATION: SEE EPIC NOTE ASSESSMENT AND PLAN: SEE EPIC NOTE Electronically Signed   By: Malachy Moan M.D.    On: 04/17/2023 08:43    Labs:  CBC: Recent Labs    11/28/22 1015 04/02/23 0927 04/22/23 0921 05/06/23 0953  WBC 5.7 3.7 2.8* PENDING  HGB 13.3 12.3* 11.6* 11.1*  HCT 41.4 38.8 37.5 34.2*  PLT 133* 90* 114* 106*    COAGS: No results for input(s): "INR", "APTT" in the last 8760 hours.  BMP: Recent Labs    10/15/22 0844 02/11/23 1135 04/02/23 0927 04/22/23 0921  NA 144  --  146* 142  K 4.2  --  3.9 4.1  CL 107*  --  108* 106  CO2 25  --  23 23  GLUCOSE 99  --  91 97  BUN 12  --  17 16  CALCIUM 8.8  --  8.8 8.9  CREATININE 1.16 1.40* 1.27 1.32*    LIVER FUNCTION TESTS: Recent Labs    10/15/22 0844 04/02/23 0927 04/22/23 0921  BILITOT 0.9 0.9 1.2  AST 28 21 20   ALT 21 19 18   ALKPHOS 111 102 91  PROT 5.6* 5.7* 5.7*  ALBUMIN 4.2 4.2 4.1    TUMOR MARKERS: No results for input(s): "AFPTM", "CEA", "CA199", "CHROMGRNA" in the last 8760 hours.  Assessment and Plan:  78 y.o. 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 PSA has remained WNL since treatment. Patient seen in clinic by Dr. Vella Redhead on 04/17/23 d/t several episodes of intermittent hematuria including the passage of small clots. In October 2024, patient had a more significant episode of bleeding and clot formation which resulted in urinary retention requiring Foley catheter placement with bladder irrigation. Patient underwent cystoscopy with his Urologist Dr. 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 Patient had been holding Eliquis since October 2024, initially noting improvement in intermittent hematuria; however, on worsening symptoms, he was referred to IR for possible PAE which was approved by Dr. Vella Redhead following evaluation in clinic.   Plan for prostate artery embolization with Dr. Vella Redhead on 05/06/23  The Risks  and benefits of embolization were discussed with the patient including, but not limited to bleeding, infection, vascular injury, post operative pain, or contrast induced renal failure.  This procedure involves the use of X-rays and because of the nature of the planned procedure, it is possible that we will have prolonged use of X-ray fluoroscopy.  Potential radiation risks to you include (but are not limited to) the following: - A slightly elevated risk for cancer several years later in life. This risk is typically less than 0.5% percent. This risk is low in comparison to the normal incidence of human cancer, which is 33% for women and 50% for men according to the American Cancer Society. - Radiation induced injury can include skin redness, resembling a rash, tissue breakdown / ulcers and hair loss (which can be temporary or permanent).   The likelihood of either of these occurring depends on the difficulty of the procedure and whether you are sensitive to radiation due to previous procedures, disease, or genetic conditions.   IF your procedure requires a prolonged use of radiation, you will be notified and given written instructions for further action.  It is your responsibility to monitor the irradiated area for the 2 weeks following the procedure and to notify your physician if you are concerned that you have suffered a radiation induced injury.    All of the patient's questions were answered, patient is agreeable to proceed. Consent signed and in chart.  Thank you for this interesting consult. I greatly enjoyed meeting ASHTEN PRATS and look forward to participating in their care. A copy of this report was sent to the requesting provider on this date.  Electronically Signed: Jama Flavors, PA-C 05/06/2023, 10:11 AM   I spent a total of  30 Minutes in face to face clinical consultation, greater than 50% of which was counseling/coordinating care for prostate artery embolization.

## 2023-05-08 ENCOUNTER — Other Ambulatory Visit: Payer: Self-pay | Admitting: Interventional Radiology

## 2023-05-08 DIAGNOSIS — R310 Gross hematuria: Secondary | ICD-10-CM

## 2023-05-08 DIAGNOSIS — N401 Enlarged prostate with lower urinary tract symptoms: Secondary | ICD-10-CM

## 2023-05-15 ENCOUNTER — Ambulatory Visit: Payer: Self-pay | Admitting: Urology

## 2023-05-27 ENCOUNTER — Ambulatory Visit
Admission: RE | Admit: 2023-05-27 | Discharge: 2023-05-27 | Disposition: A | Discharge status: Home / Self Care | Source: Ambulatory Visit | Attending: Radiology

## 2023-05-27 DIAGNOSIS — R310 Gross hematuria: Secondary | ICD-10-CM | POA: Diagnosis not present

## 2023-05-27 DIAGNOSIS — N401 Enlarged prostate with lower urinary tract symptoms: Secondary | ICD-10-CM | POA: Diagnosis not present

## 2023-05-27 HISTORY — PX: IR RADIOLOGIST EVAL & MGMT: IMG5224

## 2023-05-27 IMAGING — CT CT ABD-PEL WO/W CM
3 of 12 series · 12 of 46 positions shown, 18 images · IV contrast (APPLIED)
Comparison: 10/13/2015

CLINICAL DATA: Gross hematuria.

EXAM:
CT ABDOMEN AND PELVIS WITHOUT AND WITH CONTRAST
TECHNIQUE: Multidetector CT imaging of the abdomen and pelvis was performed
following the standard protocol before and following the bolus
administration of intravenous contrast.
CONTRAST:  100mL OMNIPAQUE IOHEXOL 350 MG/ML SOLN

[Series 2: axial pre · axial · non-contrast · 0.90mm/px · z∈[-315,-160]mm · 3 of 109 slices shown]
[im 16/109  soft-tissue]
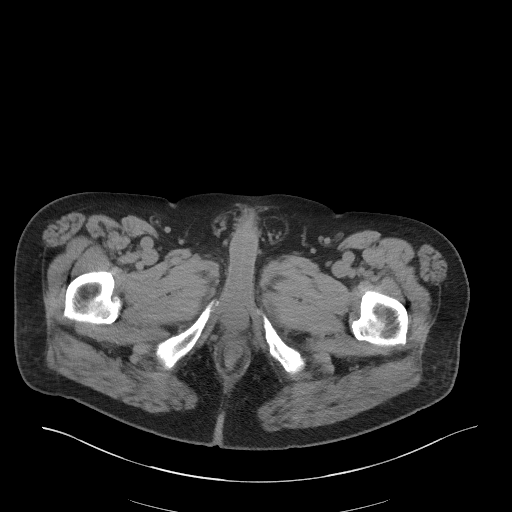
[im 31/109  soft-tissue]
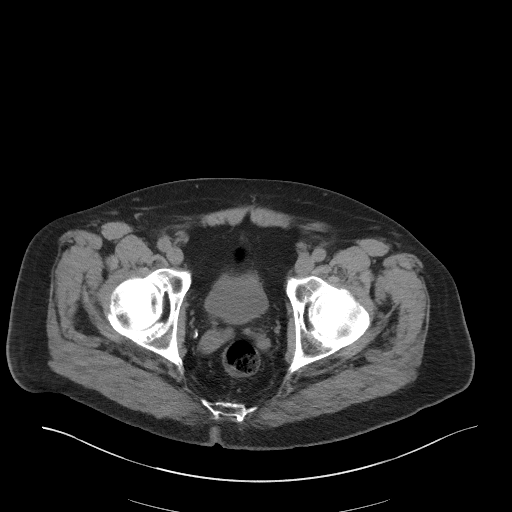
[im 47/109  soft-tissue]
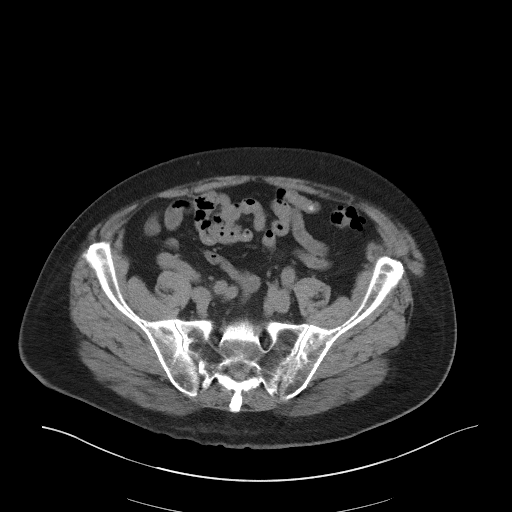

[Series 6: coronal pre · coronal · non-contrast · 0.85mm/px · 2 of 105 slices shown, 3 images]
[im 35/105  soft-tissue]
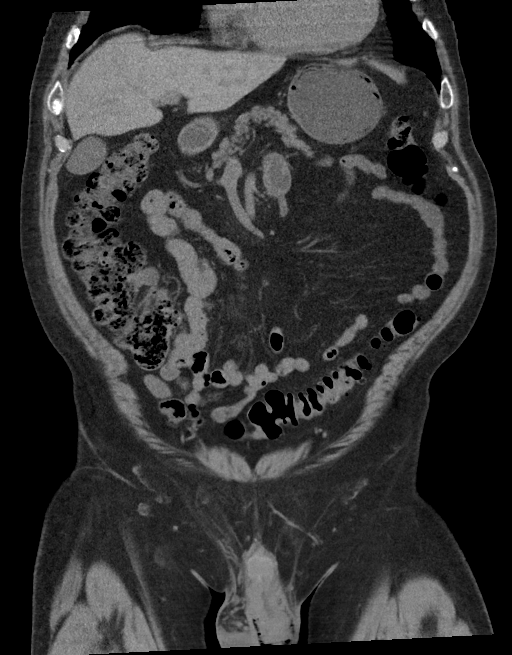
[im 35/105  bone]
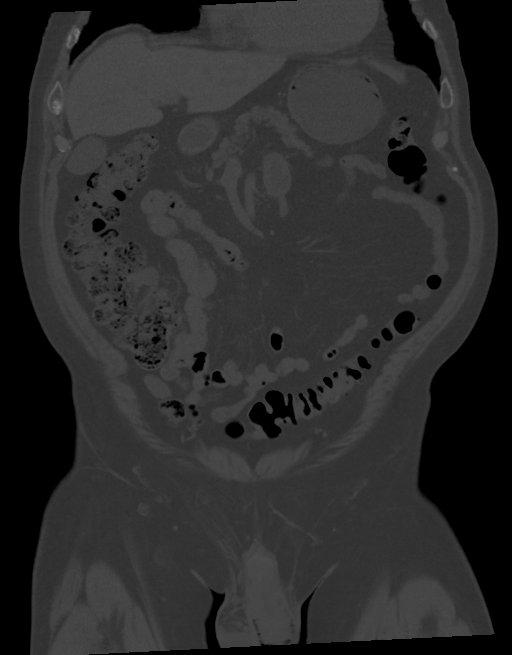
[im 70/105  soft-tissue]
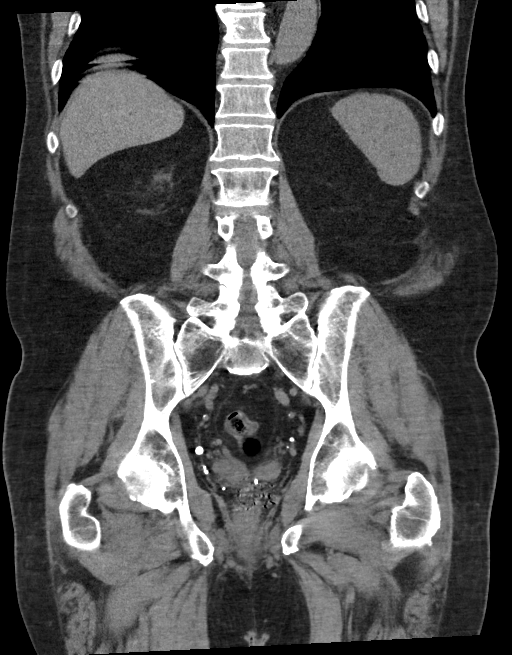

[Series 13: axial delay · axial · delayed · 0.98mm/px · z∈[-382,+64]mm · 7 of 119 slices shown, 12 images]
[im 15/119  soft-tissue]
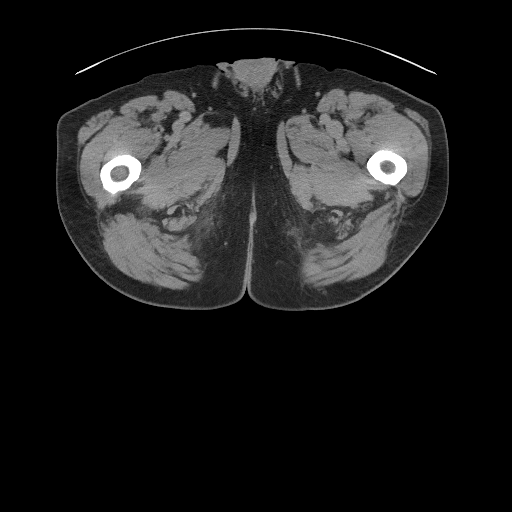
[im 15/119  bone]
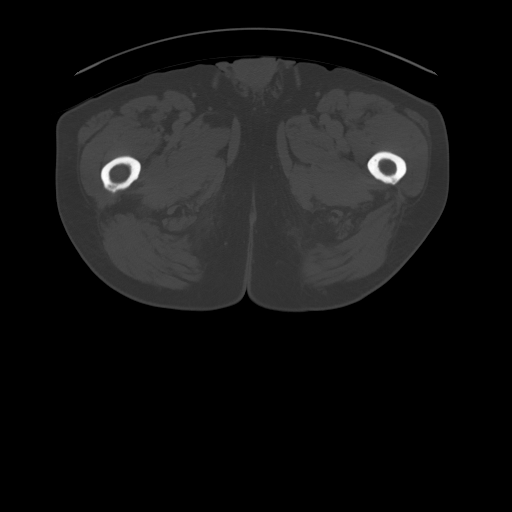
[im 30/119  soft-tissue]
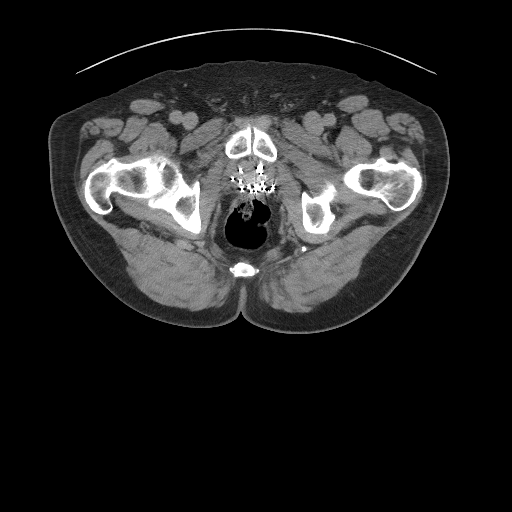
[im 45/119  soft-tissue]
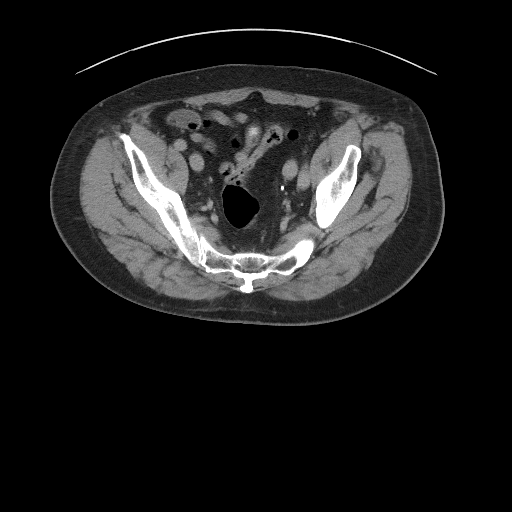
[im 60/119  soft-tissue]
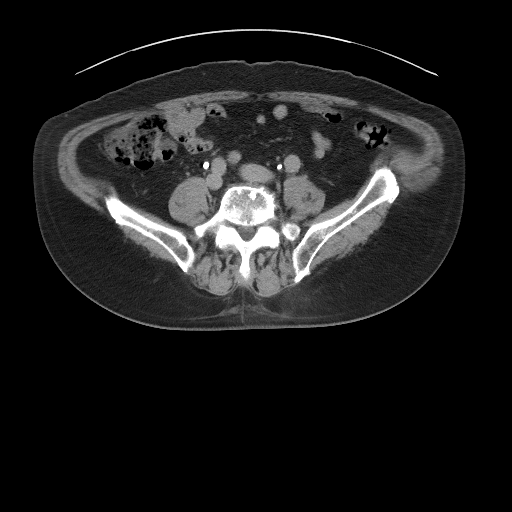
[im 60/119  lung]
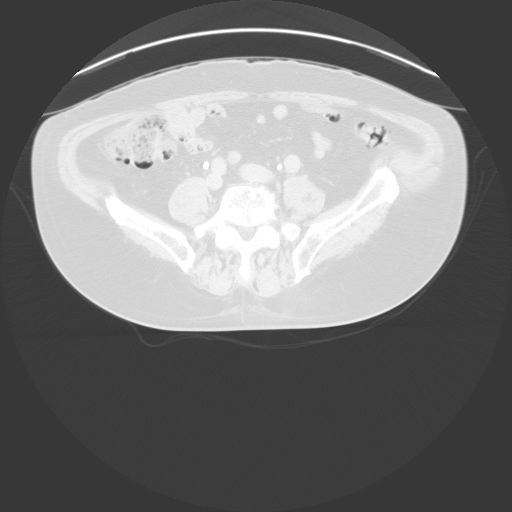
[im 74/119  soft-tissue]
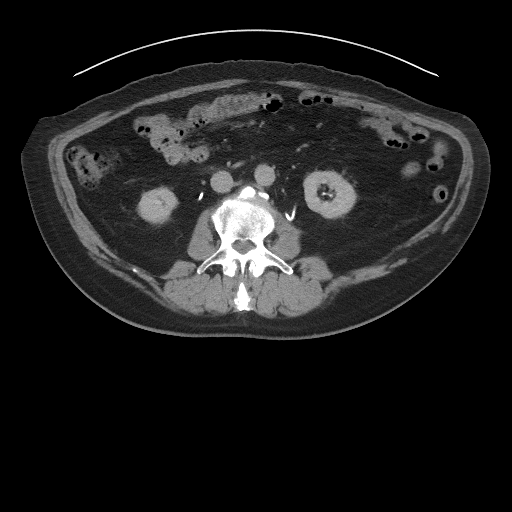
[im 74/119  lung]
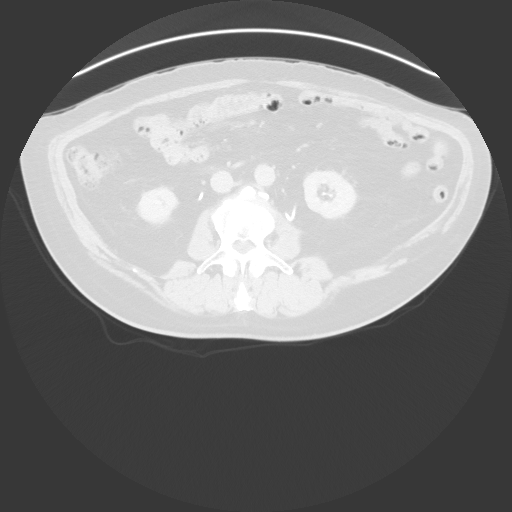
[im 89/119  soft-tissue]
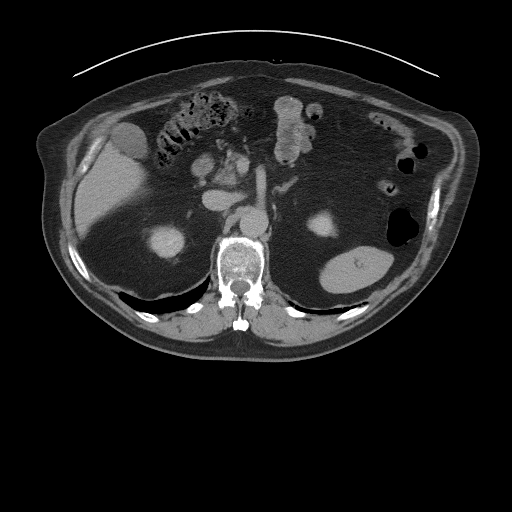
[im 89/119  lung]
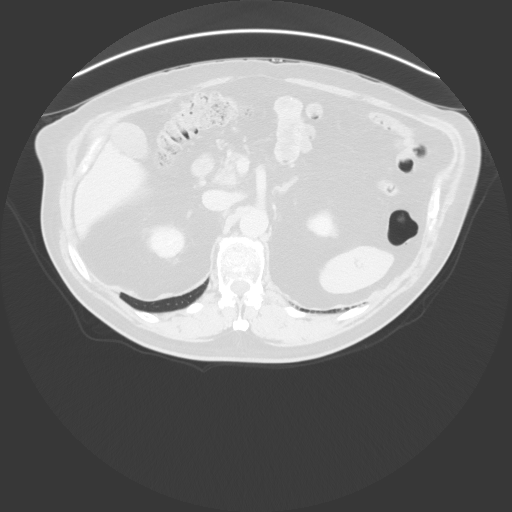
[im 104/119  soft-tissue]
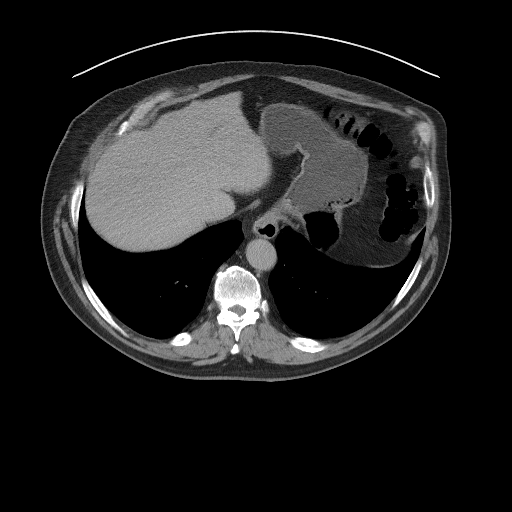
[im 104/119  lung]
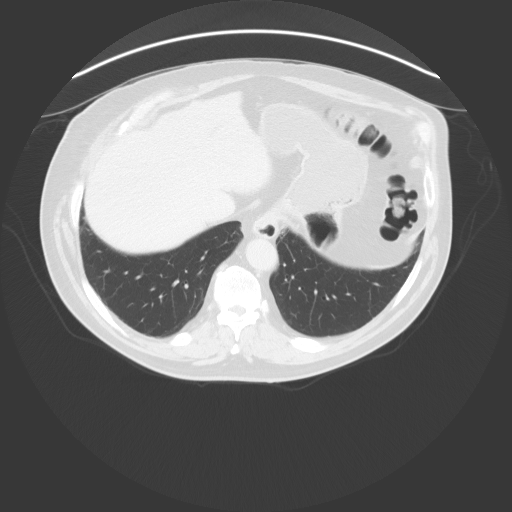

[12 of 46 positions shown; findings below may reference images not displayed]

FINDINGS: Lower chest: No acute abnormality.

Hepatobiliary: Stable 8 mm low-density structure in dome of liver
which likely represents a small cyst. Also unchanged is a 6 mm
low-density structure within the medial segment of left hepatic
lobe. No suspicious liver lesions identified at this time. The
gallbladder appears normal. No bile duct dilatation.

Pancreas: Unremarkable. No pancreatic ductal dilatation or
surrounding inflammatory changes.

Spleen: Normal in size without focal abnormality.

Adrenals/Urinary Tract: Normal adrenal glands. Small stone within
inferior pole of left kidney measures 4 mm. Normal right kidney. No
hydronephrosis identified bilaterally. No hydroureter or ureteral
lithiasis. Urinary bladder is unremarkable.

Stomach/Bowel: Stomach is within normal limits. Appendix appears
normal. No evidence of bowel wall thickening, distention, or
inflammatory changes. Colonic diverticulosis identified without
signs of acute inflammation.

Vascular/Lymphatic: Aortic atherosclerosis. No aneurysm. No
abdominopelvic adenopathy.

Reproductive: Seed implants identified within the prostate gland.

Other: No free fluid or fluid collections.

Musculoskeletal: No acute or significant osseous findings.
IMPRESSION: 1. No acute findings within the abdomen or pelvis.
2. Nonobstructing left renal calculus.
3. Aortic atherosclerosis.

Aortic Atherosclerosis ([id number]).

## 2023-05-27 NOTE — Progress Notes (Signed)
 Chief Complaint: Patient was seen in consultation today for hematuria at the request of McInnis,Caitlyn M  Referring Physician(s): McInnis,Caitlyn M  History of Present Illness: [name] [name] is a 78 y.o. male with a history of prostate cancer status post radiation therapy and recurrent significant hematuria with at least 2 episodes of prior clot retention.  He underwent prostate artery embolization approximately 3 weeks ago and presents to the clinic today for follow-up evaluation.  He reports that he is fully discovered.  His recovery was uneventful.  He has no issues at the site of his arterial access.  He reports that since just a few days after the procedure he has had no more frank hematuria or discolored urine.  He does continue to pass small bits of tissue that he refers to as "clots".  However, this has been reducing significantly compared to prior to his procedure.  He denies lower urinary tract symptoms.  No other issues at this time.  Past Medical History:  Diagnosis Date   A-fib [facility])    Atrial fibrillation with RVR (HCC)    Dupuytren's contracture    Essential hypertension 09/05/2014   Hypertension    Hypokalemia    Hypothyroidism    Malignant neoplasm of prostate (HCC) 09/05/2014   prostate,skin of the scalp   Mitral regurgitation    a. echo 03/2015: echo 03/2015: EF 50-55%, no RWMA, no sufficienct to allow for LV diastolic fxn, mild MR, LA mildly dilated at 46 mm, RV systolic fxn nl, PASP nl   Nephrolithiasis    New onset atrial fibrillation (HCC) 04/18/2015   OSA (obstructive sleep apnea) 05/11/2015   Persistent atrial fibrillation (HCC)     Past Surgical History:  Procedure Laterality Date   COLONOSCOPY WITH PROPOFOL N/A 04/18/2015   Procedure: COLONOSCOPY WITH PROPOFOL;  Surgeon: Midge Minium, MD;  Location: [facility] ENDOSCOPY;  Service: Endoscopy;  Laterality: N/A;   ELECTROPHYSIOLOGIC STUDY N/A 05/05/2015   Procedure: CARDIOVERSION;  Surgeon: Iran Ouch, MD;  Location: [facility] ORS;  Service: Cardiovascular;  Laterality: N/A;   IR EMBO TUMOR ORGAN ISCHEMIA INFARCT INC GUIDE ROADMAPPING  05/06/2023   IR RADIOLOGIST EVAL & MGMT  02/18/2023   IR RADIOLOGIST EVAL & MGMT  04/17/2023   IR RADIOLOGIST EVAL & MGMT  05/27/2023   RADIOACTIVE SEED IMPLANT N/A     Allergies: Patient has no known allergies.  Medications: Prior to Admission medications   Medication Sig Start Date End Date Taking? Authorizing Provider  amiodarone (PACERONE) 200 MG tablet Take 1 tablet by mouth once daily 04/28/23  Yes Gollan, Tollie Pizza, MD  carvedilol (COREG) 25 MG tablet Take 25 mg by mouth 2 (two) times daily with a meal.   Yes [provider]  Cholecalciferol (VITAMIN D-3) 125 MCG (5000 UT) TABS Take 5,000 Units by mouth daily.   Yes [provider]  cyanocobalamin (VITAMIN B12) 1000 MCG tablet Take 1,000 mcg by mouth daily.   Yes [provider]  finasteride (PROSCAR) 5 MG tablet Take 1 tablet (5 mg total) by mouth daily. 10/15/22  Yes Cannady, Jolene T, NP  folic acid (FOLVITE) 400 MCG tablet Take 400 mcg by mouth daily.   Yes [provider]  levothyroxine (SYNTHROID) 100 MCG tablet Take 1 tablet (100 mcg total) by mouth daily. 04/23/23  Yes Cannady, Corrie Dandy T, NP  Multiple Vitamin (MULTIVITAMIN) tablet Take 1 tablet by mouth daily.   Yes [provider]  olmesartan (BENICAR) 40 MG tablet Take 1 tablet (40  mg total) by mouth daily. 10/15/22  Yes Cannady, Jolene T, NP  Zinc 50 MG TABS Take 50 mg by mouth daily.   Yes [provider]     Family History  Problem Relation Age of Onset   Heart attack Mother    Heart disease Father    Arthritis Maternal Grandmother     Social History   Socioeconomic History   Marital status: Married    Spouse name: Rene Kocher   Number of children: 2   Years of education: Not on file   Highest education level: Associate degree: academic program  Occupational History   Occupation:  retired     Comment: Acupuncturist  Tobacco Use   Smoking status: Former    Current packs/day: 0.00    Average packs/day: 1 pack/day for 30.5 years (30.5 ttl pk-yrs)    Types: Cigarettes    Start date: 1966    Quit date: 09/05/1994    Years since quitting: 28.7   Smokeless tobacco: Former    Types: Chew    Quit date: 09/04/2004  Vaping Use   Vaping status: Never Used  Substance and Sexual Activity   Alcohol use: No   Drug use: No   Sexual activity: Not Currently  Other Topics Concern   Not on file  Social History Narrative   Plays racket ball twice a week, works outside. Indoor pets, dog and cat.    Social Drivers of Corporate investment banker Strain: Low Risk  (11/19/2022)   Overall Financial Resource Strain (CARDIA)    Difficulty of Paying Living Expenses: Not hard at all  Food Insecurity: No Food Insecurity (11/19/2022)   Hunger Vital Sign    Worried About Running Out of Food in the Last Year: Never true    Ran Out of Food in the Last Year: Never true  Transportation Needs: No Transportation Needs (11/19/2022)   PRAPARE - Administrator, Civil Service (Medical): No    Lack of Transportation (Non-Medical): No  Physical Activity: Sufficiently Active (11/19/2022)   Exercise Vital Sign    Days of Exercise per Week: 2 days    Minutes of Exercise per Session: 120 min  Stress: No Stress Concern Present (11/19/2022)   [employer] of Occupational Health - Occupational Stress Questionnaire    Feeling of Stress : Not at all  Social Connections: Socially Integrated (11/19/2022)   Social Connection and Isolation Panel [NHANES]    Frequency of Communication with Friends and Family: Three times a week    Frequency of Social Gatherings with Friends and Family: More than three times a week    Attends Religious Services: More than 4 times per year    Active Member of [employer] or Organizations: Yes    Attends Banker Meetings: Never    Marital Status: Married    Review of Systems: A 12 point ROS discussed and pertinent positives are indicated in the HPI above.  All other systems are negative.  Review of Systems  Vital Signs: BP (!) 170/101   Pulse 61   Temp 97.9 F (36.6 C)   Resp 16   SpO2 98%   Advance Care Plan: The advanced care plan/surrogate decision maker was discussed at the time of visit and the patient did not wish to discuss or was not able to name a surrogate decision maker or provide an advance care plan.    Physical Exam Constitutional:      General: He is not in acute  distress.    Appearance: Normal appearance. He is normal weight.  HENT:     Head: Normocephalic and atraumatic.  Eyes:     General: No scleral icterus. Cardiovascular:     Rate and Rhythm: Normal rate.  Pulmonary:     Effort: Pulmonary effort is normal.  Abdominal:     General: Abdomen is flat. There is no distension.     Palpations: Abdomen is soft.     Tenderness: There is no abdominal tenderness.  Skin:    General: Skin is warm and dry.  Neurological:     Mental Status: He is alert and oriented to person, place, and time.  Psychiatric:        Behavior: Behavior normal.       Imaging: IR Radiologist Eval & Mgmt Result Date: 05/27/2023 EXAM: ESTABLISHED PATIENT OFFICE VISIT CHIEF COMPLAINT: SEE NOTE IN EPIC HISTORY OF PRESENT ILLNESS: SEE NOTE IN EPIC REVIEW OF SYSTEMS: SEE NOTE IN EPIC PHYSICAL EXAMINATION: SEE NOTE IN EPIC ASSESSMENT AND PLAN: SEE NOTE IN EPIC Electronically Signed   By: Malachy Moan M.D.   On: 05/27/2023 13:15   IR EMBO TUMOR ORGAN ISCHEMIA INFARCT INC GUIDE ROADMAPPING Result Date: 05/06/2023 INDICATION: 78 year old male with recurrent hematuria and a need for long-term anticoagulation. He presents for prostate artery embolization. EXAM: IR EMBO ARTERIAL NOT HEMORR HEMANG INC GUIDE ROADMAPPING MEDICATIONS: Ciprofloxacin 400 mg IV. The antibiotic was administered within 1 hour of the procedure ANESTHESIA/SEDATION:  Moderate (conscious) sedation was employed during this procedure. A total of Versed 3 mg and Fentanyl 125 mcg was administered intravenously by the Radiology nurse. Moderate Sedation Time: 103 minutes. The patient's level of consciousness and vital signs were monitored continuously by radiology nursing throughout the procedure under my direct supervision. CONTRAST:  OMNIPAQUE IOHEXOL 300 MG/ML  SOLN FLUOROSCOPY: Radiation Exposure Index (as provided by the fluoroscopic device): 4,812 mGy Kerma COMPLICATIONS: None immediate. PROCEDURE: Informed consent was obtained from the patient following explanation of the procedure, risks, benefits and alternatives. The patient understands, agrees and consents for the procedure. All questions were addressed. A time out was performed prior to the initiation of the procedure. Maximal barrier sterile technique utilized including caps, mask, sterile gowns, sterile gloves, large sterile drape, hand hygiene, and Betadine prep. The right common femoral artery was interrogated with ultrasound and found to be widely patent. An image was obtained and stored for the medical record. Local anesthesia was attained by infiltration with 1% lidocaine. A small dermatotomy was made. Under real-time sonographic guidance, the vessel was punctured with a 21 gauge micropuncture needle. Using standard technique, the initial micro needle was exchanged over a 0.018 micro wire for a transitional 4 [place] micro sheath. The micro sheath was then exchanged over a 0.035 wire for a 5 French vascular sheath. A C2 cobra catheter was advanced up in over the aortic bifurcation and into the left internal iliac artery. An internal iliac arteriogram was performed. The obturator artery is replaced to the posterior division. And identifiable prostatic artery arises proximally from the gluteal pudendal trunk. The artery appears to arise from a common origin with the umbilical and superior vesicular arteries. A  Progreat Alpha microcatheter and Fathom 14 wire were used to select the artery thought to represent the prostatic artery. Contrast injection was performed. This artery does indeed opacify the prostate gland. There is a proximal side branch which provides supply to the bladder. The catheter was advanced beyond this side-branch and additional arteriography was performed with cone  beam CT imaging confirming opacification of the prostatic parenchyma with no opacification of the rectum or other non target structures. Particle embolization was then performed using 400 micron hydro pearls. Following cessation of antegrade flow, the prostatic artery was coil embolized using a series of penumbra detachable microcoils. Follow-up injection demonstrates no further opacification of the prostatic parenchyma. The microcatheter system was the C2 cobra catheter was removed and exchanged for a rim catheter which was used to select the ipsilateral right internal iliac artery. A right internal iliac arteriogram was performed. No definite well-defined prostatic artery is visualized. There are several small thread-like arteries all heading to the region the prostate gland including some arising from the obturator artery. Microcatheter was reintroduced. The catheter was used to select the obturator artery. Arteriography was performed in multiple obliquities. This confirms that the proximal side branches supply only the superior pubic ramus and not the prostate gland itself. Extensive additional efforts were made to catheterize the small thread-like arteries arising from a common trunk off the gluteal pudendal trunk. This was ultimately unsuccessful due to the very small size of the vessels. The catheters were removed. Hemostasis was attained with the assistance of a Celt arterial closure device. IMPRESSION: 1. Successful combined particle and coil embolization of the left prostatic artery. 2. Variant anatomy on the right with diminutive  thread like prostatic artery which could not be catheterized. Electronically Signed   By: Malachy Moan M.D.   On: 05/06/2023 16:29    Labs:  CBC: Recent Labs    11/28/22 1015 04/02/23 0927 04/22/23 0921 05/06/23 0953  WBC 5.7 3.7 2.8* 2.7*  HGB 13.3 12.3* 11.6* 11.1*  HCT 41.4 38.8 37.5 34.2*  PLT 133* 90* 114* 106*    COAGS: Recent Labs    05/06/23 0953  INR 1.2    BMP: Recent Labs    10/15/22 0844 02/11/23 1135 04/02/23 0927 04/22/23 0921 05/06/23 0953  NA 144  --  146* 142 141  K 4.2  --  3.9 4.1 3.9  CL 107*  --  108* 106 108  CO2 25  --  23 23 24   GLUCOSE 99  --  91 97 117*  BUN 12  --  17 16 24*  CALCIUM 8.8  --  8.8 8.9 8.7*  CREATININE 1.16 1.40* 1.27 1.32* 1.23  GFRNONAA  --   --   --   --  >60    LIVER FUNCTION TESTS: Recent Labs    10/15/22 0844 04/02/23 0927 04/22/23 0921  BILITOT 0.9 0.9 1.2  AST 28 21 20   ALT 21 19 18   ALKPHOS 111 102 91  PROT 5.6* 5.7* 5.7*  ALBUMIN 4.2 4.2 4.1    TUMOR MARKERS: No results for input(s): "AFPTM", "CEA", "CA199", "CHROMGRNA" in the last 8760 hours.  Assessment and Plan:  Very pleasant 78 year old gentleman now 3 weeks status post prostate artery embolization.  He has had no further hematuria although he does continue to pass what he calls small clots.  We discussed that it is unlikely for these to represent small clots if he is not having any additional hematuria (pink or red-tinged urine).  It typically takes only a small amount of bleeding to change the color of the urine and a relatively larger amount of bleeding to overcome the natural euro kinase and cause clots.  I suspect that what he is passing may be bits of mucus or sloughed mucosa rather than true clots.  Regardless, he says that this has improved considerably  since his embolization procedure.  Additionally, he is fully recovered and feeling well.  1.) No further scheduled follow-up with interventional radiology at this time.  Mr.  Janssen knows to contact us in the future at any time if he develops recurrent hematuria.    Electronically Signed: Sterling Big 05/27/2023, 1:18 PM   I spent a total of  15 Minutes in face to face in clinical consultation, greater than 50% of which was counseling/coordinating care for BPH with hematuria.

## 2023-06-09 DIAGNOSIS — D485 Neoplasm of uncertain behavior of skin: Secondary | ICD-10-CM | POA: Diagnosis not present

## 2023-06-09 DIAGNOSIS — D2272 Melanocytic nevi of left lower limb, including hip: Secondary | ICD-10-CM | POA: Diagnosis not present

## 2023-06-09 DIAGNOSIS — L820 Inflamed seborrheic keratosis: Secondary | ICD-10-CM | POA: Diagnosis not present

## 2023-06-09 DIAGNOSIS — L570 Actinic keratosis: Secondary | ICD-10-CM | POA: Diagnosis not present

## 2023-06-09 DIAGNOSIS — D044 Carcinoma in situ of skin of scalp and neck: Secondary | ICD-10-CM | POA: Diagnosis not present

## 2023-06-09 DIAGNOSIS — D2271 Melanocytic nevi of right lower limb, including hip: Secondary | ICD-10-CM | POA: Diagnosis not present

## 2023-06-09 DIAGNOSIS — D2261 Melanocytic nevi of right upper limb, including shoulder: Secondary | ICD-10-CM | POA: Diagnosis not present

## 2023-06-09 DIAGNOSIS — D2262 Melanocytic nevi of left upper limb, including shoulder: Secondary | ICD-10-CM | POA: Diagnosis not present

## 2023-06-09 DIAGNOSIS — D0362 Melanoma in situ of left upper limb, including shoulder: Secondary | ICD-10-CM | POA: Diagnosis not present

## 2023-06-09 DIAGNOSIS — Z85828 Personal history of other malignant neoplasm of skin: Secondary | ICD-10-CM | POA: Diagnosis not present

## 2023-06-19 ENCOUNTER — Ambulatory Visit: Payer: PPO | Admitting: Urology

## 2023-06-19 ENCOUNTER — Other Ambulatory Visit: Payer: Self-pay | Admitting: Emergency Medicine

## 2023-06-19 VITALS — BP 166/95 | HR 66 | Ht 73.0 in | Wt 230.0 lb

## 2023-06-19 DIAGNOSIS — R310 Gross hematuria: Secondary | ICD-10-CM | POA: Diagnosis not present

## 2023-06-19 DIAGNOSIS — R319 Hematuria, unspecified: Secondary | ICD-10-CM | POA: Diagnosis not present

## 2023-06-19 DIAGNOSIS — C61 Malignant neoplasm of prostate: Secondary | ICD-10-CM

## 2023-06-19 DIAGNOSIS — Z79899 Other long term (current) drug therapy: Secondary | ICD-10-CM

## 2023-06-19 DIAGNOSIS — I4819 Other persistent atrial fibrillation: Secondary | ICD-10-CM

## 2023-06-19 MED ORDER — FINASTERIDE 5 MG PO TABS: 5.0000 mg | ORAL_TABLET | Freq: Every day | ORAL | 4 refills | Status: DC

## 2023-06-19 NOTE — Progress Notes (Signed)
   06/19/2023 10:35 AM   [name] [name] 28-Jan-1946 [id number]  Reason for visit: Follow up history of prostate cancer, gross hematuria  HPI: 78 year old male with a history of low risk prostate cancer 2006 treated with brachytherapy, PSA has remained undetectable since that time.  He has had a long history of recurrent episodes of gross hematuria since ~2018.  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  He originally had some improvement on finasteride, but has had continued gross hematuria and ultimately culminating in clot retention x 2 in the fall 2024.  He is also anticoagulated with Eliquis for A-fib.  He ultimately opted for a prostate artery embolization to try to control his recurrent gross hematuria, and underwent this procedure on 05/06/2023 with interventional radiology.  I reviewed those notes from interventional radiology.  He does think this has been helpful.  He is only had gross hematuria about 3 days since that time which is a major improvement from previously.  He is passing a small amount of pale-colored tissue which sounds like mucosal sloughing.  He denies any change in urinary symptoms or urinary problems otherwise.  He is meeting with cardiology to consider a Watchman device which would allow him to get off of Eliquis.  I recommended continuing finasteride, only other option for controlling his recurrent hematuria would be cystoscopy and fulguration, with high likelihood of long-term recurrence.  Continue finasteride Agree with evaluation for Watchman device to potentially get off Eliquis RTC 6 months, sooner if problems   Sondra Come, MD  [facility] [address] [place], [place] [address] [phone]

## 2023-06-20 LAB — CBC
Hematocrit: 31.1 % — ABNORMAL LOW (ref 37.5–51.0)
Hemoglobin: 10.1 g/dL — ABNORMAL LOW (ref 13.0–17.7)
MCH: 30.9 pg (ref 26.6–33.0)
MCHC: 32.5 g/dL (ref 31.5–35.7)
MCV: 95 fL (ref 79–97)
Platelets: 112 10*3/uL — ABNORMAL LOW (ref 150–450)
RBC: 3.27 x10E6/uL — ABNORMAL LOW (ref 4.14–5.80)
RDW: 16.4 % — ABNORMAL HIGH (ref 11.6–15.4)
WBC: 2.4 10*3/uL — CL (ref 3.4–10.8)

## 2023-06-23 ENCOUNTER — Encounter: Payer: Self-pay | Admitting: Cardiology

## 2023-06-25 ENCOUNTER — Telehealth: Payer: Self-pay | Admitting: Cardiovascular Disease

## 2023-06-25 ENCOUNTER — Encounter: Payer: Self-pay | Admitting: Nurse Practitioner

## 2023-06-25 DIAGNOSIS — D696 Thrombocytopenia, unspecified: Secondary | ICD-10-CM

## 2023-06-25 DIAGNOSIS — R7989 Other specified abnormal findings of blood chemistry: Secondary | ICD-10-CM

## 2023-06-25 MED ORDER — CARVEDILOL 25 MG PO TABS: 25.0000 mg | ORAL_TABLET | Freq: Two times a day (BID) | ORAL | 3 refills | Status: DC

## 2023-06-25 NOTE — Telephone Encounter (Signed)
 Pt c/o medication issue:  1. Name of Medication: carvedilol (COREG) 25 MG tablet   2. How are you currently taking this medication (dosage and times per day)? As written 12.5 mg twice daily   3. Are you having a reaction (difficulty breathing--STAT)? No   4. What is your medication issue? Pt called in stating he tried to get a refill on this med but pharmacy is saying its too early, they do not have updated rx.  Dr. Mariah Milling updated it at last appt, please advise.

## 2023-06-25 NOTE — Telephone Encounter (Signed)
 Called patient and left message for call back. Prescription sent to preferred pharmacy.

## 2023-06-30 NOTE — Progress Notes (Deleted)
 Electrophysiology Clinic Note    Date:  06/30/2023  Patient ID:  [name] [name], [name] [name] 04/16/1945, MRN [id number] PCP:  Marjie Skiff, NP  Cardiologist:  None Electrophysiologist: Lanier Prude, MD  ***refresh  Discussed the use of AI scribe software for clinical note transcription with the patient, who gave verbal consent to proceed.   Patient Profile    Chief Complaint: Afib  History of Present Illness: [name] [name] is a 78 y.o. male with PMH notable for persis Afib, hematuria, HTN, tobacco use, hypothyroid, Prostate Ca, OSA on CPAP; seen today for Lanier Prude, MD for routine electrophysiology followup.   He last saw Dr. Lalla Brothers 04/02/2023 for EP evaluation of Afib. He has been maintained on amiodarone for several years and was maintaining sinus. He has history of significant hematuria that has required holding eliquis, off eliquis since 02/2023. At the appt, he had upcoming appt with urology to further discuss his hematuria. Can consider AF ablation +/- watchman if hematuria managed.   He is s/p prostate artery embolization 04/2023 with last urology f/up 06/2023. Significant improvement in hematuria.  Follow-up CBC 4/3 showed continued decrease of hemaglobin. His PCP has referred him to hematology.   On follow-up today, *** AF burden, symptoms *** palpitations *** bleeding concerns *** hematuria   Since last being seen in our clinic the patient reports doing ***.  he denies chest pain, palpitations, dyspnea, PND, orthopnea, nausea, vomiting, dizziness, syncope, edema, weight gain, or early satiety.      Arrhythmia/Device History AAD -  Amiodarone      ROS:  Please see the history of present illness. All other systems are reviewed and otherwise negative.    Physical Exam    VS:  There were no vitals taken for this visit. BMI: There is no height or weight on file to calculate BMI.  Wt Readings from Last 3 Encounters:  06/19/23 230 lb (104.3 kg)   05/06/23 238 lb 11.2 oz (108.3 kg)  04/22/23 235 lb 6.4 oz (106.8 kg)     GEN- The patient is well appearing, alert and oriented x 3 today.   Lungs- Clear to ausculation bilaterally, normal work of breathing.  Heart- {Blank single:19197::"Regular","Irregularly irregular"} rate and rhythm, no murmurs, rubs or gallops Extremities- {EDEMA LEVEL:28147::"No"} peripheral edema, warm, dry    Studies Reviewed   Previous EP, cardiology notes.    EKG is ordered. Personal review of EKG from today shows:  ***         TTE, 04/18/2015 - Left ventricle: The cavity size was mildly dilated. Systolic function was normal. The estimated ejection fraction was in the range of 50% to 55%. Wall motion was normal; there were no regional wall motion abnormalities. The study is not technically sufficient to allow evaluation of LV diastolic function.  - Mitral valve: There was mild regurgitation.  - Left atrium: The atrium was mildly dilated.  - Right ventricle: Systolic function was normal.  - Pulmonary arteries: Systolic pressure was within the normal range.   Impressions:   - Rhythm is atrial fibrillation.     Assessment and Plan     #) persis AFib #) Amiodarone use   #) Hypercoag d/t persis afib #) hematuria s/p prostate artery embolization #) anemia CHA2DS2-VASc Score = at least 3 [CHF History: 0, HTN History: 1, Diabetes History: 0, Stroke History: 0, Vascular Disease History: 0, Age Score: 2, Gender Score: 0].  Therefore, the patient's annual risk of stroke is 3.2 %.  He has been off OAC for recurrent hematuria.   *** {Confirm score is correct.  If not, click here to update score.  REFRESH note.  :1}   Stroke ppx - ***, appropriately dosed No bleeding concerns    #) ***   {Are you ordering a CV Procedure (e.g. stress test, cath, DCCV, TEE, etc)?   Press F2        :[id number]}   Current medicines are reviewed at length with the patient today.   The patient {ACTIONS; HAS/DOES  NOT HAVE:19233} concerns regarding his medicines.  The following changes were made today:  {NONE DEFAULTED:18576}  Labs/ tests ordered today include: *** No orders of the defined types were placed in this encounter.    Disposition: Follow up with {EPMDS:28135} or EP APP {EPFOLLOW UP:28173}   Signed, Adaline Holly, NP  06/30/23  7:28 PM  Electrophysiology [facility]

## 2023-07-01 ENCOUNTER — Ambulatory Visit: Payer: PPO | Admitting: Cardiology

## 2023-07-01 DIAGNOSIS — R319 Hematuria, unspecified: Secondary | ICD-10-CM

## 2023-07-01 DIAGNOSIS — I4819 Other persistent atrial fibrillation: Secondary | ICD-10-CM

## 2023-07-01 DIAGNOSIS — Z5181 Encounter for therapeutic drug level monitoring: Secondary | ICD-10-CM

## 2023-07-01 DIAGNOSIS — D6869 Other thrombophilia: Secondary | ICD-10-CM

## 2023-07-02 ENCOUNTER — Encounter: Payer: Self-pay | Admitting: Internal Medicine

## 2023-07-02 ENCOUNTER — Inpatient Hospital Stay

## 2023-07-02 ENCOUNTER — Inpatient Hospital Stay: Attending: Internal Medicine | Admitting: Internal Medicine

## 2023-07-02 DIAGNOSIS — D696 Thrombocytopenia, unspecified: Secondary | ICD-10-CM | POA: Diagnosis not present

## 2023-07-02 DIAGNOSIS — G4733 Obstructive sleep apnea (adult) (pediatric): Secondary | ICD-10-CM | POA: Insufficient documentation

## 2023-07-02 DIAGNOSIS — Z8546 Personal history of malignant neoplasm of prostate: Secondary | ICD-10-CM | POA: Diagnosis not present

## 2023-07-02 DIAGNOSIS — I4891 Unspecified atrial fibrillation: Secondary | ICD-10-CM | POA: Insufficient documentation

## 2023-07-02 DIAGNOSIS — I129 Hypertensive chronic kidney disease with stage 1 through stage 4 chronic kidney disease, or unspecified chronic kidney disease: Secondary | ICD-10-CM | POA: Diagnosis not present

## 2023-07-02 DIAGNOSIS — N189 Chronic kidney disease, unspecified: Secondary | ICD-10-CM | POA: Insufficient documentation

## 2023-07-02 DIAGNOSIS — D649 Anemia, unspecified: Secondary | ICD-10-CM | POA: Insufficient documentation

## 2023-07-02 DIAGNOSIS — D61818 Other pancytopenia: Secondary | ICD-10-CM | POA: Insufficient documentation

## 2023-07-02 DIAGNOSIS — E039 Hypothyroidism, unspecified: Secondary | ICD-10-CM | POA: Diagnosis not present

## 2023-07-02 LAB — COMPREHENSIVE METABOLIC PANEL WITH GFR
ALT: 23 U/L (ref 0–44)
AST: 25 U/L (ref 15–41)
Albumin: 4.2 g/dL (ref 3.5–5.0)
Alkaline Phosphatase: 86 U/L (ref 38–126)
Anion gap: 7 (ref 5–15)
BUN: 20 mg/dL (ref 8–23)
CO2: 25 mmol/L (ref 22–32)
Calcium: 8.9 mg/dL (ref 8.9–10.3)
Chloride: 107 mmol/L (ref 98–111)
Creatinine, Ser: 1.24 mg/dL (ref 0.61–1.24)
GFR, Estimated: 60 mL/min — ABNORMAL LOW (ref >60)
Glucose, Bld: 123 mg/dL — ABNORMAL HIGH (ref 70–99)
Potassium: 4 mmol/L (ref 3.5–5.1)
Sodium: 139 mmol/L (ref 135–145)
Total Bilirubin: 1.8 mg/dL — ABNORMAL HIGH (ref 0.0–1.2)
Total Protein: 7 g/dL (ref 6.5–8.1)

## 2023-07-02 LAB — FIBRINOGEN: Fibrinogen: 461 mg/dL (ref 210–475)

## 2023-07-02 LAB — CBC WITH DIFFERENTIAL/PLATELET
Abs Immature Granulocytes: 0.07 10*3/uL (ref 0.00–0.07)
Basophils Absolute: 0 10*3/uL (ref 0.0–0.1)
Basophils Relative: 0 %
Eosinophils Absolute: 0 10*3/uL (ref 0.0–0.5)
Eosinophils Relative: 1 %
HCT: 35.1 % — ABNORMAL LOW (ref 39.0–52.0)
Hemoglobin: 11 g/dL — ABNORMAL LOW (ref 13.0–17.0)
Immature Granulocytes: 2 %
Lymphocytes Relative: 28 %
Lymphs Abs: 1.3 10*3/uL (ref 0.7–4.0)
MCH: 30.2 pg (ref 26.0–34.0)
MCHC: 31.3 g/dL (ref 30.0–36.0)
MCV: 96.4 fL (ref 80.0–100.0)
Monocytes Absolute: 0.3 10*3/uL (ref 0.1–1.0)
Monocytes Relative: 7 %
Neutro Abs: 3 10*3/uL (ref 1.7–7.7)
Neutrophils Relative %: 62 %
Platelets: 220 10*3/uL (ref 150–400)
RBC: 3.64 MIL/uL — ABNORMAL LOW (ref 4.22–5.81)
RDW: 18.2 % — ABNORMAL HIGH (ref 11.5–15.5)
Smear Review: NORMAL
WBC: 4.7 10*3/uL (ref 4.0–10.5)
nRBC: 0.4 % — ABNORMAL HIGH (ref 0.0–0.2)

## 2023-07-02 LAB — FERRITIN: Ferritin: 43 ng/mL (ref 24–336)

## 2023-07-02 LAB — LACTATE DEHYDROGENASE: LDH: 205 U/L — ABNORMAL HIGH (ref 98–192)

## 2023-07-02 LAB — IRON AND TIBC
Iron: 130 ug/dL (ref 45–182)
Saturation Ratios: 28 % (ref 17.9–39.5)
TIBC: 459 ug/dL — ABNORMAL HIGH (ref 250–450)
UIBC: 329 ug/dL

## 2023-07-02 LAB — FOLATE: Folate: >40 ng/mL (ref >5.9)

## 2023-07-02 LAB — IMMATURE PLATELET FRACTION: Immature Platelet Fraction: 20.7 % — ABNORMAL HIGH (ref 1.2–8.6)

## 2023-07-02 LAB — PROTIME-INR
INR: 1.2 (ref 0.8–1.2)
Prothrombin Time: 15.7 s — ABNORMAL HIGH (ref 11.4–15.2)

## 2023-07-02 LAB — VITAMIN B12: Vitamin B-12: 2347 pg/mL — ABNORMAL HIGH (ref 180–914)

## 2023-07-02 LAB — APTT: aPTT: 31 s (ref 24–36)

## 2023-07-02 LAB — RETICULOCYTES
Immature Retic Fract: 16.6 % — ABNORMAL HIGH (ref 2.3–15.9)
RBC.: 3.65 MIL/uL — ABNORMAL LOW (ref 4.22–5.81)
Retic Count, Absolute: 198 10*3/uL — ABNORMAL HIGH (ref 19.0–186.0)
Retic Ct Pct: 5.4 % — ABNORMAL HIGH (ref 0.4–3.1)

## 2023-07-02 NOTE — Assessment & Plan Note (Addendum)
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 platelets 100s.  The etiology is unclear. DEC 2024-CT- [renal]- NO cirrhosis; spleen.   #I reviewed with the patient and family the multiple causes of pancytopenia include-benign causes like liver disease/hypersplenism; vitamin deficiencies--including B 12 folic acid; viral infections-HIV/hepatitis; autoimmune causes; medications etc. I also reviewed malignant causes include: leukemias-MDS/lymphomas  and rare infiltration of the bone marrow with carcinomas.    # Given the absence of any obvious benign cause-I am concerned about malignant causes especially MDS.  Recommend a bone marrow biopsy for further evaluation. Discussed with the patient the bone marrow biopsy and aspiration indication and procedure at length.  Given significant discomfort involved-I would recommend under sedation/with interventional radiology. I discussed the potential complications include-bleeding/trauma and risk of infection; which are fortunately very rare.  Patient is in agreement.  Bone marrow biopsy/aspiration is ordered.   # A.fib [off Eliquis; Dr.Gollan- sec to hematuria- OCT 2024]- on amiodarone- awaiting watchman device. [Dr.Lambert]  # GU : history of prostate cancer [2011]-status post brachytherapy/hematuria-status post IR ablation improved. No concerns of recurrence.   Thank you Ms. Cannady  NP  for allowing me to participate in the care of your pleasant patient. Please do not hesitate to contact me with questions or concerns in the interim.  # DISPOSITION: # labs today- ordered- CBC CMP LDH hepatitis panel;HIV; iron studies ferritin; reticulocyte count review of peripheral smear; B12 folate acid.  # please order bone marrow biopsy ASAP re: pancytopenia # follow up in 2-3 weeks; MD; No labs- Dr.B  Cc; Dr.Gollan, Dr.Lambert, Ms.[employer]

## 2023-07-02 NOTE — Progress Notes (Signed)
 [facility] CONSULT NOTE  Patient Care Team: Marjie Skiff, NP as PCP - General (Nurse Practitioner) Lanier Prude, MD as PCP - Electrophysiology (Cardiology) Antonieta Iba, MD as Consulting Physician (Cardiology) Dasher, Cliffton Asters, MD as Consulting Physician (Dermatology) Marlene Bast [facility]) Earna Coder, MD as Consulting Physician (Oncology) Earna Coder, MD as Consulting Physician (Hematology and Oncology)  CHIEF COMPLAINTS/PURPOSE OF CONSULTATION: Pancytopenia   HEMATOLOGY HISTORY  # PANCYTOPENIA [platelets-  WBC-  ANC-; Hb- MCV- HIV/Hepatitis: Alcohol; CT: [place]:   HISTORY OF PRESENTING ILLNESS:  [name] [name] 78 y.o.  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  Patient has history of A-fib for which he had been on Eliquis for many years.  However in October last year patient had hematuria attributed to cystitis s from prior brachytherapy.   However more recently patient noted to have worsening thrombocytopenia and also leukopenia.  Denies any frequent infections or easy bruising since holding of Eliquis.  Otherwise his appetite is good.  No weight loss.  Nausea no vomiting.  No night sweats.  No prior history of any hematologic disorder/or prior bone marrow biopsies.  Alcohol: none Hepatitis:  none HIV: none Auto-immune disease:  none Herbal medications/ new medications:  none  Otherwise renal function/creatinine normal. No prior bone marrow biopsies.    Review of Systems  Constitutional:  Negative for chills, diaphoresis, fever, malaise/fatigue and weight loss.  HENT:  Negative for nosebleeds and sore throat.   Eyes:  Negative for double vision.  Respiratory:  Negative for cough,  hemoptysis, sputum production, shortness of breath and wheezing.   Cardiovascular:  Negative for chest pain, palpitations, orthopnea and leg swelling.  Gastrointestinal:  Negative for abdominal pain, blood in stool, constipation, diarrhea, heartburn, melena, nausea and vomiting.  Genitourinary:  Negative for dysuria, frequency and urgency.  Musculoskeletal:  Negative for back pain and joint pain.  Skin: Negative.  Negative for itching and rash.  Neurological:  Negative for dizziness, tingling, focal weakness, weakness and headaches.  Endo/Heme/Allergies:  Does not bruise/bleed easily.  Psychiatric/Behavioral:  Negative for depression. The patient is not nervous/anxious and does not have insomnia.      MEDICAL HISTORY:  Past Medical History:  Diagnosis Date   A-fib [facility])    Atrial fibrillation with RVR (HCC)    Dupuytren's contracture    Essential hypertension 09/05/2014   Hypertension    Hypokalemia    Hypothyroidism    Malignant neoplasm of prostate (HCC) 09/05/2014   prostate,skin of the scalp   Mitral regurgitation    a. echo 03/2015: echo 03/2015: EF 50-55%, no RWMA, no sufficienct to allow for LV diastolic fxn, mild MR, LA mildly dilated at 46 mm, RV systolic fxn nl, PASP nl   Nephrolithiasis    New onset atrial fibrillation (HCC) 04/18/2015   OSA (obstructive sleep apnea) 05/11/2015   Persistent atrial fibrillation (HCC)     SURGICAL HISTORY: Past Surgical History:  Procedure Laterality Date   COLONOSCOPY WITH PROPOFOL N/A 04/18/2015   Procedure: COLONOSCOPY WITH PROPOFOL;  Surgeon: Midge Minium, MD;  Location: [facility] ENDOSCOPY;  Service: Endoscopy;  Laterality: N/A;   ELECTROPHYSIOLOGIC STUDY N/A 05/05/2015   Procedure: CARDIOVERSION;  Surgeon: Iran Ouch, MD;  Location: [facility] ORS;  Service: Cardiovascular;  Laterality: N/A;   IR EMBO TUMOR ORGAN ISCHEMIA  INFARCT INC GUIDE ROADMAPPING  05/06/2023   IR RADIOLOGIST EVAL & MGMT  02/18/2023   IR RADIOLOGIST EVAL & MGMT   04/17/2023   IR RADIOLOGIST EVAL & MGMT  05/27/2023   RADIOACTIVE SEED IMPLANT N/A     SOCIAL HISTORY: Social History   Socioeconomic History   Marital status: Married    Spouse name: [name] [name]   Number of children: 2   Years of education: Not on file   Highest education level: Associate degree: academic program  Occupational History   Occupation: retired     Comment: Acupuncturist  Tobacco Use   Smoking status: Former    Current packs/day: 0.00    Average packs/day: 1 pack/day for 30.5 years (30.5 ttl pk-yrs)    Types: Cigarettes    Start date: 1966    Quit date: 09/05/1994    Years since quitting: 28.8   Smokeless tobacco: Former    Types: Chew    Quit date: 09/04/2004  Vaping Use   Vaping status: Never Used  Substance and Sexual Activity   Alcohol use: No   Drug use: No   Sexual activity: Not Currently  Other Topics Concern   Not on file  Social History Narrative   Plays racket ball twice a week, works outside. Indoor pets, dog and cat.    Social Drivers of Corporate investment banker Strain: Low Risk  (11/19/2022)   Overall Financial Resource Strain (CARDIA)    Difficulty of Paying Living Expenses: Not hard at all  Food Insecurity: No Food Insecurity (11/19/2022)   Hunger Vital Sign    Worried About Running Out of Food in the Last Year: Never true    Ran Out of Food in the Last Year: Never true  Transportation Needs: No Transportation Needs (11/19/2022)   PRAPARE - Administrator, Civil Service (Medical): No    Lack of Transportation (Non-Medical): No  Physical Activity: Sufficiently Active (11/19/2022)   Exercise Vital Sign    Days of Exercise per Week: 2 days    Minutes of Exercise per Session: 120 min  Stress: No Stress Concern Present (11/19/2022)   [employer] of Occupational Health - Occupational Stress Questionnaire    Feeling of Stress : Not at all  Social Connections: Socially Integrated (11/19/2022)   Social Connection and Isolation Panel  [NHANES]    Frequency of Communication with Friends and Family: Three times a week    Frequency of Social Gatherings with Friends and Family: More than three times a week    Attends Religious Services: More than 4 times per year    Active Member of [employer] or Organizations: Yes    Attends Banker Meetings: Never    Marital Status: Married  Catering manager Violence: Not At Risk (11/19/2022)   Humiliation, Afraid, Rape, and Kick questionnaire    Fear of Current or Ex-Partner: No    Emotionally Abused: No    Physically Abused: No    Sexually Abused: No    FAMILY HISTORY: Family History  Problem Relation Age of Onset   Heart attack Mother    Heart disease Father    Colon cancer Maternal Aunt    Arthritis Maternal Grandmother     ALLERGIES:  has no known allergies.  MEDICATIONS:  Current Outpatient Medications  Medication Sig Dispense Refill   amiodarone (PACERONE) 200 MG tablet Take 1 tablet by mouth once daily 90 tablet 2   carvedilol (COREG) 25 MG tablet Take 1 tablet (25 mg  total) by mouth 2 (two) times daily with a meal. 180 tablet 3   Cholecalciferol (VITAMIN D-3) 125 MCG (5000 UT) TABS Take 5,000 Units by mouth daily.     cyanocobalamin (VITAMIN B12) 1000 MCG tablet Take 1,000 mcg by mouth daily.     finasteride (PROSCAR) 5 MG tablet Take 1 tablet (5 mg total) by mouth daily. 90 tablet 4   folic acid (FOLVITE) 400 MCG tablet Take 400 mcg by mouth daily.     levothyroxine (SYNTHROID) 100 MCG tablet Take 1 tablet (100 mcg total) by mouth daily. 60 tablet 3   Multiple Vitamin (MULTIVITAMIN) tablet Take 1 tablet by mouth daily.     olmesartan (BENICAR) 40 MG tablet Take 1 tablet (40 mg total) by mouth daily. 90 tablet 4   Zinc 50 MG TABS Take 50 mg by mouth daily.     No current facility-administered medications for this visit.     PHYSICAL EXAMINATION:   Vitals:   07/02/23 1050  BP: (!) 177/94  Pulse: 63  Resp: 12  Temp: 97.8 F (36.6 C)  SpO2: 100%    Filed Weights   07/02/23 1050  Weight: 232 lb 6.4 oz (105.4 kg)    Physical Exam Vitals and nursing note reviewed.  HENT:     Head: Normocephalic and atraumatic.     Mouth/Throat:     Pharynx: Oropharynx is clear.  Eyes:     Extraocular Movements: Extraocular movements intact.     Pupils: Pupils are equal, round, and reactive to light.  Cardiovascular:     Rate and Rhythm: Normal rate and regular rhythm.  Pulmonary:     Comments: Decreased breath sounds bilaterally.  Abdominal:     Palpations: Abdomen is soft.  Musculoskeletal:        General: Normal range of motion.     Cervical back: Normal range of motion.  Skin:    General: Skin is warm.  Neurological:     General: No focal deficit present.     Mental Status: He is alert and oriented to person, place, and time.  Psychiatric:        Behavior: Behavior normal.        Judgment: Judgment normal.      LABORATORY DATA:  I have reviewed the data as listed Lab Results  Component Value Date   WBC 2.4 (LL) 06/19/2023   HGB 10.1 (L) 06/19/2023   HCT 31.1 (L) 06/19/2023   MCV 95 06/19/2023   PLT 112 (L) 06/19/2023   Recent Labs    10/15/22 0844 02/11/23 1135 04/02/23 0927 04/22/23 0921 05/06/23 0953  NA 144  --  146* 142 141  K 4.2  --  3.9 4.1 3.9  CL 107*  --  108* 106 108  CO2 25  --  23 23 24   GLUCOSE 99  --  91 97 117*  BUN 12  --  17 16 24*  CREATININE 1.16   < > 1.27 1.32* 1.23  CALCIUM 8.8  --  8.8 8.9 8.7*  GFRNONAA  --   --   --   --  >60  PROT 5.6*  --  5.7* 5.7*  --   ALBUMIN 4.2  --  4.2 4.1  --   AST 28  --  21 20  --   ALT 21  --  19 18  --   ALKPHOS 111  --  102 91  --   BILITOT 0.9  --  0.9 1.2  --    < > =  values in this interval not displayed.     No results found.  ASSESSMENT & PLAN:   Thrombocytopenia (HCC) # PANCYTOPENIA-[2019- thrombocytopebia hemoglobin- 10; leukopenia- 2.5; no diff. platelets 100s.  The etiology is unclear. DEC 2024-CT- [renal]- NO cirrhosis; spleen.    #I reviewed with the patient and family the multiple causes of pancytopenia include-benign causes like liver disease/hypersplenism; vitamin deficiencies--including B 12 folic acid; viral infections-HIV/hepatitis; autoimmune causes; medications etc. I also reviewed malignant causes include: leukemias-MDS/lymphomas  and rare infiltration of the bone marrow with carcinomas.    # Given the absence of any obvious benign cause-I am concerned about malignant causes especially MDS.  Recommend a bone marrow biopsy for further evaluation. Discussed with the patient the bone marrow biopsy and aspiration indication and procedure at length.  Given significant discomfort involved-I would recommend under sedation/with interventional radiology. I discussed the potential complications include-bleeding/trauma and risk of infection; which are fortunately very rare.  Patient is in agreement.  Bone marrow biopsy/aspiration is ordered.   # A.fib [off Eliquis; Dr.Gollan- sec to hematuria- OCT 2024]- on amiodarone- awaiting watchman device. [Dr.Lambert]  # GU : history of prostate cancer [2011]-status post brachytherapy/hematuria-status post IR ablation improved. No concerns of recurrence.   Thank you Ms. Cannady  NP  for allowing me to participate in the care of your pleasant patient. Please do not hesitate to contact me with questions or concerns in the interim.  # DISPOSITION: # labs today- ordered- CBC CMP LDH hepatitis panel;HIV; iron studies ferritin; reticulocyte count review of peripheral smear; B12 folate acid.  # please order bone marrow biopsy ASAP re: pancytopenia # follow up in 2-3 weeks; MD; No labs- Dr.B  Cc; Dr.Gollan, Dr.Lambert, Ms.Cannady   All questions were answered. The patient knows to call the clinic with any problems, questions or concerns.    Gwyn Leos, MD 07/02/2023 12:18 PM

## 2023-07-02 NOTE — Progress Notes (Signed)
 Easy bruising: YES Petechiae (bleeding under skin): NO Gingival bleeding (gums): NO Epistaxis (nose bleeds): NO Hematochezia (blood in stools): NO Hematuria (blood in urine):  NO

## 2023-07-03 LAB — ERYTHROPOIETIN: Erythropoietin: 65.6 m[IU]/mL — ABNORMAL HIGH (ref 2.6–18.5)

## 2023-07-03 LAB — MISC LABCORP TEST (SEND OUT)
Labcorp test code: 16881
Labcorp test code: 6510
Labcorp test code: 83935

## 2023-07-04 ENCOUNTER — Telehealth: Payer: Self-pay

## 2023-07-04 LAB — MISC LABCORP TEST (SEND OUT): Labcorp test code: 144050

## 2023-07-04 LAB — HAPTOGLOBIN: Haptoglobin: 124 mg/dL (ref 34–355)

## 2023-07-04 LAB — COPPER, SERUM: Copper: 114 ug/dL (ref 69–132)

## 2023-07-04 NOTE — Telephone Encounter (Signed)
 I spoke with [name] [name] at IR, pt is sch'd for his bone marrow biopsy for 07/09/23 at 8am with arrival time of 7am. Pt notified npo 6+-8 hours prior, needs a driver and location.

## 2023-07-07 ENCOUNTER — Other Ambulatory Visit: Payer: Self-pay | Admitting: Radiology

## 2023-07-07 DIAGNOSIS — Z01812 Encounter for preprocedural laboratory examination: Secondary | ICD-10-CM

## 2023-07-07 NOTE — H&P (Signed)
 Chief Complaint: Patient was seen in consultation today for pancytopenia   Referring Physician(s): Gwyn Leos  Supervising Physician: Elene Griffes  Patient Status: [facility] - Out-pt  History of Present Illness: [name] [name] is a 78 y.o. male with a medical history significant for atrial fibrillation on Eliquis , low-grade prostate cancer (s/p brachytherapy 2011) and HTN. He has a history of recurrent, significant hematuria with clots and urinary retention requiring foley catheterization. He is familiar to IR from prostate artery embolization 05/06/23. He was recently referred to Hematology for worsening thrombocytopenia and leukopenia. An extensive lab work up was negative for any obvious cause for his pancytopenia. There is concern for possible MDS.   Interventional Radiology has been asked to evaluate this patient for an image-guided bone marrow biopsy with aspiration for further work up.   Past Medical History:  Diagnosis Date   A-fib [facility])    Atrial fibrillation with RVR (HCC)    Dupuytren's contracture    Essential hypertension 09/05/2014   Hypertension    Hypokalemia    Hypothyroidism    Malignant neoplasm of prostate (HCC) 09/05/2014   prostate,skin of the scalp   Mitral regurgitation    a. echo 03/2015: echo 03/2015: EF 50-55%, no RWMA, no sufficienct to allow for LV diastolic fxn, mild MR, LA mildly dilated at 46 mm, RV systolic fxn nl, PASP nl   Nephrolithiasis    New onset atrial fibrillation (HCC) 04/18/2015   OSA (obstructive sleep apnea) 05/11/2015   Persistent atrial fibrillation (HCC)     Past Surgical History:  Procedure Laterality Date   COLONOSCOPY WITH PROPOFOL  N/A 04/18/2015   Procedure: COLONOSCOPY WITH PROPOFOL ;  Surgeon: Marnee Sink, MD;  Location: [facility] ENDOSCOPY;  Service: Endoscopy;  Laterality: N/A;   ELECTROPHYSIOLOGIC STUDY N/A 05/05/2015   Procedure: CARDIOVERSION;  Surgeon: Wenona Hamilton, MD;  Location: [facility] ORS;  Service:  Cardiovascular;  Laterality: N/A;   IR EMBO TUMOR ORGAN ISCHEMIA INFARCT INC GUIDE ROADMAPPING  05/06/2023   IR RADIOLOGIST EVAL & MGMT  02/18/2023   IR RADIOLOGIST EVAL & MGMT  04/17/2023   IR RADIOLOGIST EVAL & MGMT  05/27/2023   RADIOACTIVE SEED IMPLANT N/A     Allergies: Patient has no known allergies.  Medications: Prior to Admission medications   Medication Sig Start Date End Date Taking? Authorizing Provider  amiodarone  (PACERONE ) 200 MG tablet Take 1 tablet by mouth once daily 04/28/23   Gollan, Timothy J, MD  carvedilol  (COREG ) 25 MG tablet Take 1 tablet (25 mg total) by mouth 2 (two) times daily with a meal. 06/25/23   Gollan, Deadra Everts, MD  Cholecalciferol (VITAMIN D-3) 125 MCG (5000 UT) TABS Take 5,000 Units by mouth daily.    [provider]  cyanocobalamin (VITAMIN B12) 1000 MCG tablet Take 1,000 mcg by mouth daily.    [provider]  finasteride  (PROSCAR ) 5 MG tablet Take 1 tablet (5 mg total) by mouth daily. 06/19/23   Lawerence Pressman, MD  folic acid (FOLVITE) 400 MCG tablet Take 400 mcg by mouth daily.    [provider]  levothyroxine  (SYNTHROID ) 100 MCG tablet Take 1 tablet (100 mcg total) by mouth daily. 04/23/23   Cannady, Jolene T, NP  Multiple Vitamin (MULTIVITAMIN) tablet Take 1 tablet by mouth daily.    [provider]  olmesartan  (BENICAR ) 40 MG tablet Take 1 tablet (40 mg total) by mouth daily. 10/15/22   Cannady, Jolene T, NP  Zinc 50 MG TABS Take 50 mg by mouth daily.  [provider]     Family History  Problem Relation Age of Onset   Heart attack Mother    Heart disease Father    Colon cancer Maternal Aunt    Arthritis Maternal Grandmother     Social History   Socioeconomic History   Marital status: Married    Spouse name: [name]   Number of children: 2   Years of education: Not on file   Highest education level: Associate degree: academic program  Occupational History   Occupation: retired     Comment:  Acupuncturist  Tobacco Use   Smoking status: Former    Current packs/day: 0.00    Average packs/day: 1 pack/day for 30.5 years (30.5 ttl pk-yrs)    Types: Cigarettes    Start date: 1966    Quit date: 09/05/1994    Years since quitting: 28.8   Smokeless tobacco: Former    Types: Chew    Quit date: 09/04/2004  Vaping Use   Vaping status: Never Used  Substance and Sexual Activity   Alcohol use: No   Drug use: No   Sexual activity: Not Currently  Other Topics Concern   Not on file  Social History Narrative   Plays racket ball twice a week, works outside. Indoor pets, dog and cat.    Social Drivers of Corporate investment banker Strain: Low Risk  (11/19/2022)   Overall Financial Resource Strain (CARDIA)    Difficulty of Paying Living Expenses: Not hard at all  Food Insecurity: No Food Insecurity (11/19/2022)   Hunger Vital Sign    Worried About Running Out of Food in the Last Year: Never true    Ran Out of Food in the Last Year: Never true  Transportation Needs: No Transportation Needs (11/19/2022)   PRAPARE - Administrator, Civil Service (Medical): No    Lack of Transportation (Non-Medical): No  Physical Activity: Sufficiently Active (11/19/2022)   Exercise Vital Sign    Days of Exercise per Week: 2 days    Minutes of Exercise per Session: 120 min  Stress: No Stress Concern Present (11/19/2022)   [employer] of Occupational Health - Occupational Stress Questionnaire    Feeling of Stress : Not at all  Social Connections: Socially Integrated (11/19/2022)   Social Connection and Isolation Panel [NHANES]    Frequency of Communication with Friends and Family: Three times a week    Frequency of Social Gatherings with Friends and Family: More than three times a week    Attends Religious Services: More than 4 times per year    Active Member of [employer] or Organizations: Yes    Attends Banker Meetings: Never    Marital Status: Married    Review of Systems: A  12 point ROS discussed and pertinent positives are indicated in the HPI above.  All other systems are negative.  Review of Systems  Constitutional:  Negative for appetite change and fatigue.  Respiratory:  Positive for cough. Negative for shortness of breath.   Cardiovascular:  Negative for chest pain and leg swelling.  Gastrointestinal:  Negative for abdominal pain, diarrhea, nausea and vomiting.  Neurological:  Negative for dizziness and headaches.    Vital Signs: BP (!) 158/87   Pulse 69   Temp 97.8 F (36.6 C) (Oral)   Ht 6\' 1"  (1.854 m)   Wt 233 lb 4.8 oz (105.8 kg)   SpO2 94%   BMI 30.78 kg/m   Physical Exam Constitutional:  General: He is not in acute distress.    Appearance: He is not ill-appearing.  HENT:     Mouth/Throat:     Mouth: Mucous membranes are moist.     Pharynx: Oropharynx is clear.  Cardiovascular:     Rate and Rhythm: Normal rate.  Abdominal:     Palpations: Abdomen is soft.     Tenderness: There is no abdominal tenderness.  Musculoskeletal:     Right lower leg: No edema.     Left lower leg: No edema.  Skin:    General: Skin is warm and dry.  Neurological:     Mental Status: He is alert and oriented to person, place, and time.  Psychiatric:        Mood and Affect: Mood normal.        Behavior: Behavior normal.        Thought Content: Thought content normal.        Judgment: Judgment normal.     Imaging: No results found.  Labs:  CBC: Recent Labs    04/22/23 0921 05/06/23 0953 06/19/23 1114 07/02/23 1215  WBC 2.8* 2.7* 2.4* 4.7  HGB 11.6* 11.1* 10.1* 11.0*  HCT 37.5 34.2* 31.1* 35.1*  PLT 114* 106* 112* 220    COAGS: Recent Labs    05/06/23 0953 07/02/23 1219  INR 1.2 1.2  APTT  --  31    BMP: Recent Labs    04/02/23 0927 04/22/23 0921 05/06/23 0953 07/02/23 1215  NA 146* 142 141 139  K 3.9 4.1 3.9 4.0  CL 108* 106 108 107  CO2 23 23 24 25   GLUCOSE 91 97 117* 123*  BUN 17 16 24* 20  CALCIUM 8.8 8.9 8.7*  8.9  CREATININE 1.27 1.32* 1.23 1.24  GFRNONAA  --   --  >60 60*    LIVER FUNCTION TESTS: Recent Labs    10/15/22 0844 04/02/23 0927 04/22/23 0921 07/02/23 1215  BILITOT 0.9 0.9 1.2 1.8*  AST 28 21 20 25   ALT 21 19 18 23   ALKPHOS 111 102 91 86  PROT 5.6* 5.7* 5.7* 7.0  ALBUMIN 4.2 4.2 4.1 4.2    TUMOR MARKERS: No results for input(s): "AFPTM", "CEA", "CA199", "CHROMGRNA" in the last 8760 hours.  Assessment and Plan:  Pancytopenia: Revan L. Carnell, 78 year old male, presents today to the [facility] Interventional Radiology department for an image-guided bone marrow biopsy with aspiration.   Risks and benefits of this procedure were discussed with the patient and/or patient's family including, but not limited to bleeding, infection, damage to adjacent structures or low yield requiring additional tests.  All of the questions were answered and there is agreement to proceed. He has been NPO. He is a full code.   Consent signed and in chart.  Thank you for this interesting consult.  I greatly enjoyed meeting JERMEY CLOSS and look forward to participating in their care.  A copy of this report was sent to the requesting provider on this date.  Electronically Signed: Jetta Morrow, AGACNP-BC 07/08/2023, 8:24 AM   I spent a total of  30 Minutes   in face to face in clinical consultation, greater than 50% of which was counseling/coordinating care for pancytopenia.

## 2023-07-07 NOTE — Progress Notes (Signed)
 Patient for IR Bone Marrow Biopsy on Tues 07/08/23, I called and spoke with the patient on the phone and gave pre-procedure instructions. Pt was made aware to be here at 7:30a, NPO after MN prior to procedure as well as driver post procedure/recovery/discharge. Pt stated understanding.  Called 07/07/23

## 2023-07-08 ENCOUNTER — Ambulatory Visit
Admission: RE | Admit: 2023-07-08 | Discharge: 2023-07-08 | Disposition: A | Discharge status: Home / Self Care | Source: Ambulatory Visit | Attending: Internal Medicine | Admitting: Internal Medicine

## 2023-07-08 ENCOUNTER — Other Ambulatory Visit: Payer: Self-pay

## 2023-07-08 ENCOUNTER — Encounter: Payer: Self-pay | Admitting: Radiology

## 2023-07-08 DIAGNOSIS — Z7901 Long term (current) use of anticoagulants: Secondary | ICD-10-CM | POA: Diagnosis not present

## 2023-07-08 DIAGNOSIS — Z01812 Encounter for preprocedural laboratory examination: Secondary | ICD-10-CM

## 2023-07-08 DIAGNOSIS — Z87891 Personal history of nicotine dependence: Secondary | ICD-10-CM | POA: Diagnosis not present

## 2023-07-08 DIAGNOSIS — D72822 Plasmacytosis: Secondary | ICD-10-CM | POA: Diagnosis not present

## 2023-07-08 DIAGNOSIS — D7589 Other specified diseases of blood and blood-forming organs: Secondary | ICD-10-CM | POA: Insufficient documentation

## 2023-07-08 DIAGNOSIS — D61818 Other pancytopenia: Secondary | ICD-10-CM | POA: Diagnosis not present

## 2023-07-08 DIAGNOSIS — Z8546 Personal history of malignant neoplasm of prostate: Secondary | ICD-10-CM | POA: Diagnosis not present

## 2023-07-08 DIAGNOSIS — Z800 Family history of malignant neoplasm of digestive organs: Secondary | ICD-10-CM | POA: Insufficient documentation

## 2023-07-08 DIAGNOSIS — D696 Thrombocytopenia, unspecified: Secondary | ICD-10-CM

## 2023-07-08 DIAGNOSIS — Z923 Personal history of irradiation: Secondary | ICD-10-CM | POA: Insufficient documentation

## 2023-07-08 DIAGNOSIS — I4819 Other persistent atrial fibrillation: Secondary | ICD-10-CM | POA: Diagnosis not present

## 2023-07-08 DIAGNOSIS — D619 Aplastic anemia, unspecified: Secondary | ICD-10-CM | POA: Diagnosis not present

## 2023-07-08 DIAGNOSIS — Z8249 Family history of ischemic heart disease and other diseases of the circulatory system: Secondary | ICD-10-CM | POA: Insufficient documentation

## 2023-07-08 HISTORY — PX: IR BONE MARROW BIOPSY & ASPIRATION: IMG5727

## 2023-07-08 LAB — CBC WITH DIFFERENTIAL/PLATELET
Abs Immature Granulocytes: 0.14 10*3/uL — ABNORMAL HIGH (ref 0.00–0.07)
Basophils Absolute: 0 10*3/uL (ref 0.0–0.1)
Basophils Relative: 1 %
Eosinophils Absolute: 0 10*3/uL (ref 0.0–0.5)
Eosinophils Relative: 1 %
HCT: 31.2 % — ABNORMAL LOW (ref 39.0–52.0)
Hemoglobin: 9.5 g/dL — ABNORMAL LOW (ref 13.0–17.0)
Immature Granulocytes: 2 %
Lymphocytes Relative: 24 %
Lymphs Abs: 1.6 10*3/uL (ref 0.7–4.0)
MCH: 29.4 pg (ref 26.0–34.0)
MCHC: 30.4 g/dL (ref 30.0–36.0)
MCV: 96.6 fL (ref 80.0–100.0)
Monocytes Absolute: 0.4 10*3/uL (ref 0.1–1.0)
Monocytes Relative: 7 %
Neutro Abs: 4.3 10*3/uL (ref 1.7–7.7)
Neutrophils Relative %: 65 %
Platelets: 173 10*3/uL (ref 150–400)
RBC: 3.23 MIL/uL — ABNORMAL LOW (ref 4.22–5.81)
RDW: 17.7 % — ABNORMAL HIGH (ref 11.5–15.5)
Smear Review: NORMAL
WBC: 6.6 10*3/uL (ref 4.0–10.5)
nRBC: 0 % (ref 0.0–0.2)

## 2023-07-08 MED ORDER — MIDAZOLAM HCL 2 MG/2ML IJ SOLN
INTRAMUSCULAR | Status: AC | PRN
  Administered 2023-07-08: .5 mg via INTRAVENOUS
  Administered 2023-07-08: 1 mg via INTRAVENOUS

## 2023-07-08 MED ORDER — FENTANYL CITRATE (PF) 100 MCG/2ML IJ SOLN
INTRAMUSCULAR | Status: AC | PRN
  Administered 2023-07-08: 25 ug via INTRAVENOUS
  Administered 2023-07-08: 50 ug via INTRAVENOUS

## 2023-07-08 MED ORDER — MIDAZOLAM HCL 2 MG/2ML IJ SOLN
INTRAMUSCULAR | Status: AC
  Filled 2023-07-08: qty 2

## 2023-07-08 MED ORDER — SODIUM CHLORIDE 0.9 % IV SOLN: INTRAVENOUS | Status: DC

## 2023-07-08 MED ORDER — HEPARIN SOD (PORK) LOCK FLUSH 100 UNIT/ML IV SOLN
INTRAVENOUS | Status: AC
  Filled 2023-07-08: qty 5

## 2023-07-08 MED ORDER — FENTANYL CITRATE (PF) 100 MCG/2ML IJ SOLN
INTRAMUSCULAR | Status: AC
  Filled 2023-07-08: qty 2

## 2023-07-08 NOTE — Procedures (Signed)
 Interventional Radiology Procedure:   Indications: Pancytopenia  Procedure: Image guided bone marrow biopsy  Findings: 2 aspirates and 1 core from right ilium  Complications: None     EBL: Minimal, less than 10 ml  Plan: Discharge to home in one hour.   Tonnette Zwiebel R. Julietta Ogren, MD  Pager: [phone]

## 2023-07-14 LAB — SURGICAL PATHOLOGY

## 2023-07-16 ENCOUNTER — Inpatient Hospital Stay

## 2023-07-16 ENCOUNTER — Encounter: Payer: Self-pay | Admitting: Internal Medicine

## 2023-07-16 ENCOUNTER — Inpatient Hospital Stay: Admitting: Internal Medicine

## 2023-07-16 ENCOUNTER — Encounter (HOSPITAL_COMMUNITY): Payer: Self-pay | Admitting: Internal Medicine

## 2023-07-16 VITALS — BP 155/82 | HR 71 | Temp 98.4°F | Resp 16 | Ht 73.0 in | Wt 233.4 lb

## 2023-07-16 DIAGNOSIS — D649 Anemia, unspecified: Secondary | ICD-10-CM

## 2023-07-16 DIAGNOSIS — D696 Thrombocytopenia, unspecified: Secondary | ICD-10-CM | POA: Diagnosis not present

## 2023-07-16 DIAGNOSIS — D61818 Other pancytopenia: Secondary | ICD-10-CM | POA: Diagnosis not present

## 2023-07-16 NOTE — Assessment & Plan Note (Addendum)
 [name] [name]

## 2023-07-16 NOTE — Assessment & Plan Note (Addendum)
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 platelets 100s. DEC 2024-CT- [renal]- NO cirrhosis; spleen. 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  The differential diagnosis for hypercellularity/dyspoiesis-concurrent MDS with plasmacytosis/multiple myeloma.  Other possibility likely reactive secondary plasmacytosis. plasmacytosis ranging from 17% (aspirate to focally 70% by CD138 IHC on the biopsy/clot section with an overall average interpreted as 30%.  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  Will check FISH panel for plasma cell neoplasm.  Will check NGS for MDS.  # I reviewed the complicated results of the bone marrow biopsy with the patient and wife in detail.  I would recommend further workup for multiple myeloma including bone x-ray and SPEP kappa lambda light chain ratio.  Will also consider PET scan.  # A.fib [off Eliquis ; Dr.Gollan- sec to hematuria- OCT 2024]- on amiodarone - awaiting watchman device. [Dr.Lambert]  # GU : history of prostate cancer [2011]-status post brachytherapy/hematuria-status post IR ablation improved. No concerns of recurrence.   # DISPOSITION: # labs today- ordered- MM panel; k/l light chains; beta- 2 micorglobulin-  # X-rays bone- ordered  # follow up in 2-3 weeks; MD; No labs-  Dr.B  # 40 minutes face-to-face with the patient discussing the above plan of care; more than 50% of time spent on prognosis/ natural history; counseling and coordination.    Cc; Dr.Gollan, Dr.Lambert, Ms.[employer]

## 2023-07-16 NOTE — Progress Notes (Signed)
 Easy bruising: YES Petechiae (bleeding under skin): NO Gingival bleeding (gums): NO Epistaxis (nose bleeds): NO  Hematochezia (blood in stools): NO  Hematuria (blood in urine): YES  Pt has noticed some blood and tissue in his urine. Has gotten better since prostate embolization 2/25.

## 2023-07-16 NOTE — Progress Notes (Signed)
 [facility] CONSULT NOTE  Patient Care Team: Lemar Pyles, NP as PCP - General (Nurse Practitioner) Boyce Byes, MD as PCP - Electrophysiology (Cardiology) Devorah Fonder, MD as Consulting Physician (Cardiology) Dasher, Margette Sheldon, MD as Consulting Physician (Dermatology) Ric Chain [facility]) Gwyn Leos, MD as Consulting Physician (Oncology) Gwyn Leos, MD as Consulting Physician (Hematology and Oncology)  CHIEF COMPLAINTS/PURPOSE OF CONSULTATION: Pancytopenia   HEMATOLOGY HISTORY  # PANCYTOPENIA [platelets-  WBC-  ANC-; Hb- MCV- HIV/Hepatitis: Alcohol; CT: US :   HISTORY OF PRESENTING ILLNESS: Patient ambulating-independently. Accompanied by family/wife.   [name] [name] 78 y.o.  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  Bone marrow biopsy was uncomplicated.  Ongoing fatigue otherwise denies any fevers or chills.  No nausea no vomiting.   Review of Systems  Constitutional:  Positive for malaise/fatigue. Negative for chills, diaphoresis, fever and weight loss.  HENT:  Negative for nosebleeds and sore throat.   Eyes:  Negative for double vision.  Respiratory:  Negative for cough, hemoptysis, sputum production, shortness of breath and wheezing.   Cardiovascular:  Negative for chest pain, palpitations, orthopnea and leg swelling.  Gastrointestinal:  Negative for abdominal pain, blood in stool, constipation, diarrhea, heartburn, melena, nausea and vomiting.  Genitourinary:  Negative for dysuria, frequency and urgency.  Musculoskeletal:  Negative for back pain and joint pain.  Skin: Negative.  Negative for itching and rash.  Neurological:  Negative for dizziness, tingling, focal  weakness, weakness and headaches.  Endo/Heme/Allergies:  Does not bruise/bleed easily.  Psychiatric/Behavioral:  Negative for depression. The patient is not nervous/anxious and does not have insomnia.      MEDICAL HISTORY:  Past Medical History:  Diagnosis Date   A-fib [facility])    Atrial fibrillation with RVR (HCC)    Dupuytren's contracture    Essential hypertension 09/05/2014   Hypertension    Hypokalemia    Hypothyroidism    Malignant neoplasm of prostate (HCC) 09/05/2014   prostate,skin of the scalp   Mitral regurgitation    a. echo 03/2015: echo 03/2015: EF 50-55%, no RWMA, no sufficienct to allow for LV diastolic fxn, mild MR, LA mildly dilated at 46 mm, RV systolic fxn nl, PASP nl   Nephrolithiasis    New onset atrial fibrillation (HCC) 04/18/2015   OSA (obstructive sleep apnea) 05/11/2015   Persistent atrial fibrillation (HCC)     SURGICAL HISTORY: Past Surgical History:  Procedure Laterality Date   COLONOSCOPY WITH PROPOFOL  N/A 04/18/2015   Procedure: COLONOSCOPY WITH PROPOFOL ;  Surgeon: Marnee Sink, MD;  Location: [facility] ENDOSCOPY;  Service: Endoscopy;  Laterality: N/A;   ELECTROPHYSIOLOGIC STUDY N/A 05/05/2015   Procedure: CARDIOVERSION;  Surgeon: Wenona Hamilton, MD;  Location: [facility] ORS;  Service: Cardiovascular;  Laterality: N/A;   IR BONE MARROW BIOPSY & ASPIRATION  07/08/2023   IR EMBO TUMOR ORGAN ISCHEMIA INFARCT INC GUIDE ROADMAPPING  05/06/2023   IR RADIOLOGIST EVAL & MGMT  02/18/2023   IR RADIOLOGIST EVAL & MGMT  04/17/2023   IR RADIOLOGIST EVAL & MGMT  05/27/2023   RADIOACTIVE SEED IMPLANT N/A     SOCIAL HISTORY: Social History   Socioeconomic History   Marital status: Married    Spouse name: [name]   Number of children: 2   Years of education: Not on file   Highest  education level: Associate degree: academic program  Occupational History   Occupation: retired     Comment: Acupuncturist  Tobacco Use   Smoking status: Former    Current packs/day: 0.00     Average packs/day: 1 pack/day for 30.5 years (30.5 ttl pk-yrs)    Types: Cigarettes    Start date: 1966    Quit date: 09/05/1994    Years since quitting: 28.8   Smokeless tobacco: Former    Types: Chew    Quit date: 09/04/2004  Vaping Use   Vaping status: Never Used  Substance and Sexual Activity   Alcohol use: No   Drug use: No   Sexual activity: Not Currently  Other Topics Concern   Not on file  Social History Narrative   Plays racket ball twice a week, works outside. Indoor pets, dog and cat.    Social Drivers of Corporate investment banker Strain: Low Risk  (11/19/2022)   Overall Financial Resource Strain (CARDIA)    Difficulty of Paying Living Expenses: Not hard at all  Food Insecurity: No Food Insecurity (11/19/2022)   Hunger Vital Sign    Worried About Running Out of Food in the Last Year: Never true    Ran Out of Food in the Last Year: Never true  Transportation Needs: No Transportation Needs (11/19/2022)   PRAPARE - Administrator, Civil Service (Medical): No    Lack of Transportation (Non-Medical): No  Physical Activity: Sufficiently Active (11/19/2022)   Exercise Vital Sign    Days of Exercise per Week: 2 days    Minutes of Exercise per Session: 120 min  Stress: No Stress Concern Present (11/19/2022)   [employer] of Occupational Health - Occupational Stress Questionnaire    Feeling of Stress : Not at all  Social Connections: Socially Integrated (11/19/2022)   Social Connection and Isolation Panel [NHANES]    Frequency of Communication with Friends and Family: Three times a week    Frequency of Social Gatherings with Friends and Family: More than three times a week    Attends Religious Services: More than 4 times per year    Active Member of [employer] or Organizations: Yes    Attends Banker Meetings: Never    Marital Status: Married  Catering manager Violence: Not At Risk (11/19/2022)   Humiliation, Afraid, Rape, and Kick questionnaire     Fear of Current or Ex-Partner: No    Emotionally Abused: No    Physically Abused: No    Sexually Abused: No    FAMILY HISTORY: Family History  Problem Relation Age of Onset   Heart attack Mother    Heart disease Father    Colon cancer Maternal Aunt    Arthritis Maternal Grandmother     ALLERGIES:  has no known allergies.  MEDICATIONS:  Current Outpatient Medications  Medication Sig Dispense Refill   amiodarone  (PACERONE ) 200 MG tablet Take 1 tablet by mouth once daily 90 tablet 2   carvedilol  (COREG ) 25 MG tablet Take 1 tablet (25 mg total) by mouth 2 (two) times daily with a meal. 180 tablet 3   Cholecalciferol (VITAMIN D-3) 125 MCG (5000 UT) TABS Take 5,000 Units by mouth daily.     cyanocobalamin (VITAMIN B12) 1000 MCG tablet Take 1,000 mcg by mouth daily.     finasteride  (PROSCAR ) 5 MG tablet Take 1 tablet (5 mg total) by mouth daily. 90 tablet 4   folic acid (FOLVITE) 400 MCG tablet Take 400 mcg by mouth  daily.     levothyroxine  (SYNTHROID ) 100 MCG tablet Take 1 tablet (100 mcg total) by mouth daily. 60 tablet 3   Multiple Vitamin (MULTIVITAMIN) tablet Take 1 tablet by mouth daily.     olmesartan  (BENICAR ) 40 MG tablet Take 1 tablet (40 mg total) by mouth daily. 90 tablet 4   Zinc 50 MG TABS Take 50 mg by mouth daily.     No current facility-administered medications for this visit.     PHYSICAL EXAMINATION:   Vitals:   07/16/23 1517  BP: (!) 155/82  Pulse: 71  Resp: 16  Temp: 98.4 F (36.9 C)  SpO2: 98%    Filed Weights   07/16/23 1517  Weight: 233 lb 6.4 oz (105.9 kg)     Physical Exam Vitals and nursing note reviewed.  HENT:     Head: Normocephalic and atraumatic.     Mouth/Throat:     Pharynx: Oropharynx is clear.  Eyes:     Extraocular Movements: Extraocular movements intact.     Pupils: Pupils are equal, round, and reactive to light.  Cardiovascular:     Rate and Rhythm: Normal rate and regular rhythm.  Pulmonary:     Comments: Decreased  breath sounds bilaterally.  Abdominal:     Palpations: Abdomen is soft.  Musculoskeletal:        General: Normal range of motion.     Cervical back: Normal range of motion.  Skin:    General: Skin is warm.  Neurological:     General: No focal deficit present.     Mental Status: He is alert and oriented to person, place, and time.  Psychiatric:        Behavior: Behavior normal.        Judgment: Judgment normal.      LABORATORY DATA:  I have reviewed the data as listed Lab Results  Component Value Date   WBC 6.6 07/08/2023   HGB 9.5 (L) 07/08/2023   HCT 31.2 (L) 07/08/2023   MCV 96.6 07/08/2023   PLT 173 07/08/2023   Recent Labs    04/02/23 0927 04/22/23 0921 05/06/23 0953 07/02/23 1215  NA 146* 142 141 139  K 3.9 4.1 3.9 4.0  CL 108* 106 108 107  CO2 23 23 24 25   GLUCOSE 91 97 117* 123*  BUN 17 16 24* 20  CREATININE 1.27 1.32* 1.23 1.24  CALCIUM 8.8 8.9 8.7* 8.9  GFRNONAA  --   --  >60 60*  PROT 5.7* 5.7*  --  7.0  ALBUMIN 4.2 4.1  --  4.2  AST 21 20  --  25  ALT 19 18  --  23  ALKPHOS 102 91  --  86  BILITOT 0.9 1.2  --  1.8*     IR BONE MARROW BIOPSY & ASPIRATION Addendum Date: 07/08/2023 ADDENDUM REPORT: 07/08/2023 12:55 ADDENDUM: Correction to the sterile technique. The back was prepped and draped in sterile fashion. Sterile technique was utilized using caps, mask, sterile gloves, sterile drape, hand hygiene and skin antiseptic. Electronically Signed   By: Elene Griffes M.D.   On: 07/08/2023 12:55   Result Date: 07/08/2023 INDICATION: 78 year old with pancytopenia. EXAM: FLUOROSCOPIC GUIDED BONE MARROW ASPIRATES AND BIOPSY Physician: Olive Better. Henn, MD MEDICATIONS: Moderate sedation ANESTHESIA/SEDATION: Moderate (conscious) sedation was employed during this procedure. A total of Versed  1.5 mg and fentanyl  75 mcg was administered intravenously at the order of the provider performing the procedure. Total intra-service moderate sedation time:  . Patient's  level  of consciousness and vital signs were monitored continuously by radiology nurse throughout the procedure under the supervision of the provider performing the procedure. FLUOROSCOPY: Radiation Exposure Index (as provided by the fluoroscopic device): 27.4 mGy Kerma COMPLICATIONS: None immediate. PROCEDURE: The procedure was explained to the patient. The risks and benefits of the procedure were discussed and the patient's questions were addressed. Informed consent was obtained from the patient. The patient was placed prone on interventional table. The back was prepped and draped in sterile fashion. Maximal barrier sterile technique was utilized including caps, mask, sterile gowns, sterile gloves, sterile drape, hand hygiene and skin antiseptic. The skin and right posterior ilium were anesthetized with 1% lidocaine . 11 gauge bone needle was directed into the right ilium with fluoroscopic guidance. Two aspirates and 1 core biopsy were obtained. Bandage placed over the puncture site. Fluoroscopic image saved for documentation. IMPRESSION: Fluoroscopic guided bone marrow aspiration and core biopsy. Electronically Signed: By: Elene Griffes M.D. On: 07/08/2023 12:47    ASSESSMENT & PLAN:   Thrombocytopenia (HCC)    Symptomatic anemia # PANCYTOPENIA-[2019- thrombocytopebia hemoglobin- 10; leukopenia- 2.5; no diff. platelets 100s. DEC 2024-CT- [renal]- NO cirrhosis; spleen. 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  The differential diagnosis for hypercellularity/dyspoiesis-concurrent MDS with plasmacytosis/multiple myeloma.  Other possibility likely reactive secondary plasmacytosis. plasmacytosis ranging from 17% (aspirate to focally 70% by  CD138 IHC on the biopsy/clot section with an overall average interpreted as 30%.  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  Will check FISH panel for plasma cell neoplasm.  Will check NGS for MDS.  # I reviewed the complicated results of the bone marrow biopsy with the patient and wife in detail.  I would recommend further workup for multiple myeloma including bone x-ray and SPEP kappa lambda light chain ratio.  Will also consider PET scan.  # A.fib [off Eliquis ; Dr.Gollan- sec to hematuria- OCT 2024]- on amiodarone - awaiting watchman device. [Dr.Lambert]  # GU : history of prostate cancer [2011]-status post brachytherapy/hematuria-status post IR ablation improved. No concerns of recurrence.   # DISPOSITION: # labs today- ordered- MM panel; k/l light chains; beta- 2 micorglobulin-  # X-rays bone- ordered  # follow up in 2-3 weeks; MD; No labs- Dr.B  # 40 minutes face-to-face with the patient discussing the above plan of care; more than 50% of time spent on prognosis/ natural history; counseling and coordination.    Cc; Dr.Gollan, Dr.Lambert, Ms.Cannady  All questions were answered. The patient knows to call the clinic with any problems, questions or concerns.   Gwyn Leos, MD 07/18/2023 11:58 AM

## 2023-07-17 ENCOUNTER — Ambulatory Visit
Admission: RE | Admit: 2023-07-17 | Discharge: 2023-07-17 | Disposition: A | Discharge status: Home / Self Care | Source: Ambulatory Visit | Attending: Internal Medicine | Admitting: Internal Medicine

## 2023-07-17 DIAGNOSIS — D649 Anemia, unspecified: Secondary | ICD-10-CM | POA: Insufficient documentation

## 2023-07-17 DIAGNOSIS — D0362 Melanoma in situ of left upper limb, including shoulder: Secondary | ICD-10-CM | POA: Diagnosis not present

## 2023-07-17 DIAGNOSIS — C9000 Multiple myeloma not having achieved remission: Secondary | ICD-10-CM | POA: Diagnosis not present

## 2023-07-17 LAB — BETA 2 MICROGLOBULIN, SERUM: Beta-2 Microglobulin: 2.8 mg/L — ABNORMAL HIGH (ref 0.6–2.4)

## 2023-07-18 LAB — KAPPA/LAMBDA LIGHT CHAINS
Kappa free light chain: 9.4 mg/L (ref 3.3–19.4)
Kappa, lambda light chain ratio: 1.34 (ref 0.26–1.65)
Lambda free light chains: 7 mg/L (ref 5.7–26.3)

## 2023-07-21 ENCOUNTER — Encounter (HOSPITAL_COMMUNITY): Payer: Self-pay | Admitting: Internal Medicine

## 2023-07-22 LAB — MULTIPLE MYELOMA PANEL, SERUM
Albumin SerPl Elph-Mcnc: 3.7 g/dL (ref 2.9–4.4)
Albumin/Glob SerPl: 1.9 — ABNORMAL HIGH (ref 0.7–1.7)
Alpha 1: 0.3 g/dL (ref 0.0–0.4)
Alpha2 Glob SerPl Elph-Mcnc: 0.7 g/dL (ref 0.4–1.0)
B-Globulin SerPl Elph-Mcnc: 0.8 g/dL (ref 0.7–1.3)
Gamma Glob SerPl Elph-Mcnc: 0.3 g/dL — ABNORMAL LOW (ref 0.4–1.8)
Globulin, Total: 2 g/dL — ABNORMAL LOW (ref 2.2–3.9)
IgA: 60 mg/dL — ABNORMAL LOW (ref 61–437)
IgG (Immunoglobin G), Serum: 344 mg/dL — ABNORMAL LOW (ref 603–1613)
IgM (Immunoglobulin M), Srm: 8 mg/dL — ABNORMAL LOW (ref 15–143)
Total Protein ELP: 5.7 g/dL — ABNORMAL LOW (ref 6.0–8.5)

## 2023-07-23 ENCOUNTER — Ambulatory Visit: Attending: Cardiology | Admitting: Cardiology

## 2023-07-23 VITALS — BP 166/86 | HR 63 | Ht 73.0 in | Wt 233.4 lb

## 2023-07-23 DIAGNOSIS — D6869 Other thrombophilia: Secondary | ICD-10-CM

## 2023-07-23 DIAGNOSIS — Z79899 Other long term (current) drug therapy: Secondary | ICD-10-CM

## 2023-07-23 DIAGNOSIS — I4819 Other persistent atrial fibrillation: Secondary | ICD-10-CM | POA: Diagnosis not present

## 2023-07-23 DIAGNOSIS — R319 Hematuria, unspecified: Secondary | ICD-10-CM | POA: Diagnosis not present

## 2023-07-23 NOTE — Progress Notes (Signed)
 Electrophysiology Clinic Note    Date:  07/23/2023  Patient ID:  [name] [name], MRN [id number] PCP:  Lemar Pyles, NP  Cardiologist:  None Electrophysiologist: Boyce Byes, MD    Discussed the use of AI scribe software for clinical note transcription with the patient, who gave verbal consent to proceed.   Patient Profile    Chief Complaint: Afib  History of Present Illness: [name] [name] is a 78 y.o. male with PMH notable for persis Afib, hematuria, HTN, tobacco use, hypothyroid, Prostate Ca, OSA on CPAP; seen today for Boyce Byes, MD for routine electrophysiology followup.   He last saw Dr. Marven Slimmer 04/02/2023 for EP evaluation of Afib. He has been maintained on amiodarone  for several years and was maintaining sinus. He has history of significant hematuria that has required holding eliquis , off eliquis  since 02/2023. At the appt, he had upcoming appt with urology to further discuss his hematuria. Can consider AF ablation +/- watchman if hematuria managed.   He is s/p prostate artery embolization 04/2023 with last urology f/up 06/2023. Significant improvement in hematuria.  Follow-up CBC 4/3 showed continued decrease of hemaglobin. His PCP has referred him to hematology, who he saw 4/30 and planned for additional labwork, bone marrow biopsy.   On follow-up today, he is not aware of any recent AF episodes. His BP machine often alerts him for possible AFib as does his watch. He has a kardia-mobile like device that does not indicate AFib but "abnormal rhythm." He has no further hematuria.   He checks BP regularly at home, most readings 120-130 systolic. He eats low salt diet. He remains very active playing raquetball a couple days a week and working in yard.   He has follow-up soon with Dr. Valentine Gasmen (oncology) to further discuss treatment plan.    Arrhythmia/Device History AAD -  Amiodarone       ROS:  Please see the history of present  illness. All other systems are reviewed and otherwise negative.    Physical Exam    VS:  BP (!) 166/86 (BP Location: Left Arm, Patient Position: Sitting)   Pulse 63   Ht 6\' 1"  (1.854 m)   Wt 233 lb 6.4 oz (105.9 kg)   SpO2 98%   BMI 30.79 kg/m  BMI: Body mass index is 30.79 kg/m.  Wt Readings from Last 3 Encounters:  07/23/23 233 lb 6.4 oz (105.9 kg)  07/16/23 233 lb 6.4 oz (105.9 kg)  07/08/23 233 lb 4.8 oz (105.8 kg)     GEN- The patient is well appearing, alert and oriented x 3 today.   Lungs- Clear to ausculation bilaterally, normal work of breathing.  Heart- Irregularly irregular rate and rhythm, no murmurs, rubs or gallops Extremities- No peripheral edema, warm, dry    Studies Reviewed   Previous EP, cardiology notes.    EKG is ordered. Personal review of EKG from today shows:    EKG Interpretation Date/Time:  Wednesday Jul 23 2023 10:29:51 EDT Ventricular Rate:  79 PR Interval:  246 QRS Duration:  94 QT Interval:  434 QTC Calculation: 497 R Axis:   2  Text Interpretation: Sinus rhythm with 1st degree A-V block with Premature supraventricular complexes Prolonged QT Confirmed by Hana Trippett [phone]) on 07/23/2023 10:37:25 AM     TTE, 04/18/2015 - Left ventricle: The cavity size was mildly dilated. Systolic function was normal. The estimated ejection fraction was in the range of 50% to 55%. Wall motion was normal;  there were no regional wall motion abnormalities. The study is not technically sufficient to allow evaluation of LV diastolic function.  - Mitral valve: There was mild regurgitation.  - Left atrium: The atrium was mildly dilated.  - Right ventricle: Systolic function was normal.  - Pulmonary arteries: Systolic pressure was within the normal range.   Impressions:   - Rhythm is atrial fibrillation.     Assessment and Plan     #) persis AFib #) PAC #) Amiodarone  use Maintaining sinus rhythm, though EKG does have frequent PAC Most recent LFTs  and thyroid  labs stable Continue 200mg  amiodarone  daily Not ablation candidate with inability to anticoagulate   #) Hypercoag d/t persis afib #) hematuria s/p prostate artery embolization #) anemia CHA2DS2-VASc Score = at least 3 [CHF History: 0, HTN History: 1, Diabetes History: 0, Stroke History: 0, Vascular Disease History: 0, Age Score: 2, Gender Score: 0].  Therefore, the patient's annual risk of stroke is 3.2 %.    He has been off OAC for recurrent hematuria, now resolved. Workup ongoing for anemia with oncology        Current medicines are reviewed at length with the patient today.   The patient does not have concerns regarding his medicines.  The following changes were made today:  none  Labs/ tests ordered today include:  Orders Placed This Encounter  Procedures   EKG 12-Lead     Disposition: Follow up with Dr. Marven Slimmer or EP APP in 3 months   Signed, Coraleigh Sheeran, NP  07/23/23  2:04 PM  Electrophysiology [facility]

## 2023-07-28 ENCOUNTER — Inpatient Hospital Stay: Attending: Internal Medicine | Admitting: Internal Medicine

## 2023-07-28 ENCOUNTER — Encounter: Payer: Self-pay | Admitting: Internal Medicine

## 2023-07-28 VITALS — BP 150/75 | HR 70 | Temp 97.0°F | Resp 18 | Wt 233.0 lb

## 2023-07-28 DIAGNOSIS — D649 Anemia, unspecified: Secondary | ICD-10-CM | POA: Diagnosis not present

## 2023-07-28 DIAGNOSIS — Z8546 Personal history of malignant neoplasm of prostate: Secondary | ICD-10-CM | POA: Insufficient documentation

## 2023-07-28 DIAGNOSIS — I4891 Unspecified atrial fibrillation: Secondary | ICD-10-CM | POA: Insufficient documentation

## 2023-07-28 DIAGNOSIS — Z79899 Other long term (current) drug therapy: Secondary | ICD-10-CM | POA: Diagnosis not present

## 2023-07-28 DIAGNOSIS — Z800 Family history of malignant neoplasm of digestive organs: Secondary | ICD-10-CM | POA: Insufficient documentation

## 2023-07-28 DIAGNOSIS — Z923 Personal history of irradiation: Secondary | ICD-10-CM | POA: Diagnosis not present

## 2023-07-28 DIAGNOSIS — Z87891 Personal history of nicotine dependence: Secondary | ICD-10-CM | POA: Diagnosis not present

## 2023-07-28 DIAGNOSIS — D61818 Other pancytopenia: Secondary | ICD-10-CM | POA: Diagnosis not present

## 2023-07-28 NOTE — Assessment & Plan Note (Signed)
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 platelets 100s. DEC 2024-CT- [renal]- NO cirrhosis; spleen. 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  The differential diagnosis for hypercellularity/dyspoiesis-concurrent MDS with plasmacytosis/multiple myeloma.  Other possibility likely reactive secondary plasmacytosis. plasmacytosis ranging from 17% (aspirate to focally 70% by CD138 IHC on the biopsy/clot section with an overall average interpreted as 30%.  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  Will check FISH panel for plasma cell neoplasm.  Will check NGS for MDS.  Immunofixation shows IgA monoclonal protein with lambda light chain specificity- NOT QUANTIFIABLE. K/L= Normal. Beta 2- 2.8-   # I reviewed the complicated results of the bone marrow biopsy with the patient and wife in detail.  I would recommend further workup for multiple myeloma including bone x-ray and SPEP kappa lambda light chain ratio.   # A.fib [id number] Eliquis ; Dr.Gollan- sec to hematuria- OCT 2024]- on amiodarone - awaiting watchman device. [Dr.Lambert]  # GU : history of prostate cancer [2011]-status post brachytherapy/hematuria-status post IR ablation improved. No concerns of recurrence.   # DISPOSITION: # follow up TBD-- Dr.B   Cc; Dr.Gollan,  Dr.Lambert, Ms.[employer]

## 2023-07-28 NOTE — Progress Notes (Unsigned)
 [facility] CONSULT NOTE  Patient Care Team: Lemar Pyles, NP as PCP - General (Nurse Practitioner) Boyce Byes, MD as PCP - Electrophysiology (Cardiology) Devorah Fonder, MD as Consulting Physician (Cardiology) Dasher, Margette Sheldon, MD as Consulting Physician (Dermatology) [facility]) Gwyn Leos, MD as Consulting Physician (Oncology) Gwyn Leos, MD as Consulting Physician (Hematology and Oncology)  CHIEF COMPLAINTS/PURPOSE OF CONSULTATION: Pancytopenia   HEMATOLOGY HISTORY  # PANCYTOPENIA [platelets-  WBC-  ANC-; Hb- MCV- HIV/Hepatitis: Alcohol; CT: US :   HISTORY OF PRESENTING ILLNESS: Patient ambulating-independently. Accompanied by family/wife.   [name] [name] 78 y.o.  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  Patient had a Bone Survey Met done on 07/17/2023, I just called and spoke with radiology in [place].  Bone marrow biopsy was uncomplicated.  Ongoing fatigue otherwise denies any fevers or chills.  No nausea no vomiting.   Review of Systems  Constitutional:  Positive for malaise/fatigue. Negative for chills, diaphoresis, fever and weight loss.  HENT:  Negative for nosebleeds and sore throat.   Eyes:  Negative for double vision.  Respiratory:  Negative for cough, hemoptysis, sputum production, shortness of breath and wheezing.   Cardiovascular:  Negative for chest pain, palpitations, orthopnea and leg swelling.  Gastrointestinal:  Negative for abdominal pain, blood in stool, constipation, diarrhea, heartburn, melena, nausea and vomiting.  Genitourinary:  Negative for dysuria, frequency and urgency.  Musculoskeletal:  Negative for back pain and joint pain.   Skin: Negative.  Negative for itching and rash.  Neurological:  Negative for dizziness, tingling, focal weakness, weakness and headaches.  Endo/Heme/Allergies:  Does not bruise/bleed easily.  Psychiatric/Behavioral:  Negative for depression. The patient is not nervous/anxious and does not have insomnia.      MEDICAL HISTORY:  Past Medical History:  Diagnosis Date   A-fib [facility])    Atrial fibrillation with RVR (HCC)    Dupuytren's contracture    Essential hypertension 09/05/2014   Hypertension    Hypokalemia    Hypothyroidism    Malignant neoplasm of prostate (HCC) 09/05/2014   prostate,skin of the scalp   Mitral regurgitation    a. echo 03/2015: echo 03/2015: EF 50-55%, no RWMA, no sufficienct to allow for LV diastolic fxn, mild MR, LA mildly dilated at 46 mm, RV systolic fxn nl, PASP nl   Nephrolithiasis    New onset atrial fibrillation (HCC) 04/18/2015   OSA (obstructive sleep apnea) 05/11/2015   Persistent atrial fibrillation (HCC)     SURGICAL HISTORY: Past Surgical History:  Procedure Laterality Date   COLONOSCOPY WITH PROPOFOL  N/A 04/18/2015   Procedure: COLONOSCOPY WITH PROPOFOL ;  Surgeon: Marnee Sink, MD;  Location: [facility] ENDOSCOPY;  Service: Endoscopy;  Laterality: N/A;   ELECTROPHYSIOLOGIC STUDY N/A 05/05/2015   Procedure: CARDIOVERSION;  Surgeon: Wenona Hamilton, MD;  Location: [facility] ORS;  Service: Cardiovascular;  Laterality: N/A;   IR BONE MARROW BIOPSY & ASPIRATION  07/08/2023   IR EMBO TUMOR ORGAN ISCHEMIA INFARCT INC GUIDE ROADMAPPING  05/06/2023   IR RADIOLOGIST EVAL & MGMT  02/18/2023   IR RADIOLOGIST EVAL & MGMT  04/17/2023   IR RADIOLOGIST EVAL & MGMT  05/27/2023   RADIOACTIVE SEED IMPLANT N/A     SOCIAL HISTORY: Social History   Socioeconomic History   Marital status: Married    Spouse  name: [name]   Number of children: 2   Years of education: Not on file   Highest education level: Associate degree: academic program  Occupational History   Occupation:  retired     Comment: Acupuncturist  Tobacco Use   Smoking status: Former    Current packs/day: 0.00    Average packs/day: 1 pack/day for 30.5 years (30.5 ttl pk-yrs)    Types: Cigarettes    Start date: 1966    Quit date: 09/05/1994    Years since quitting: 28.9   Smokeless tobacco: Former    Types: Chew    Quit date: 09/04/2004  Vaping Use   Vaping status: Never Used  Substance and Sexual Activity   Alcohol use: No   Drug use: No   Sexual activity: Not Currently  Other Topics Concern   Not on file  Social History Narrative   Plays racket ball twice a week, works outside. Indoor pets, dog and cat.    Social Drivers of Corporate investment banker Strain: Low Risk  (11/19/2022)   Overall Financial Resource Strain (CARDIA)    Difficulty of Paying Living Expenses: Not hard at all  Food Insecurity: No Food Insecurity (11/19/2022)   Hunger Vital Sign    Worried About Running Out of Food in the Last Year: Never true    Ran Out of Food in the Last Year: Never true  Transportation Needs: No Transportation Needs (11/19/2022)   PRAPARE - Administrator, Civil Service (Medical): No    Lack of Transportation (Non-Medical): No  Physical Activity: Sufficiently Active (11/19/2022)   Exercise Vital Sign    Days of Exercise per Week: 2 days    Minutes of Exercise per Session: 120 min  Stress: No Stress Concern Present (11/19/2022)   [employer] of Occupational Health - Occupational Stress Questionnaire    Feeling of Stress : Not at all  Social Connections: Socially Integrated (11/19/2022)   Social Connection and Isolation Panel [NHANES]    Frequency of Communication with Friends and Family: Three times a week    Frequency of Social Gatherings with Friends and Family: More than three times a week    Attends Religious Services: More than 4 times per year    Active Member of [employer] or Organizations: Yes    Attends Banker Meetings: Never    Marital Status: Married   Catering manager Violence: Not At Risk (11/19/2022)   Humiliation, Afraid, Rape, and Kick questionnaire    Fear of Current or Ex-Partner: No    Emotionally Abused: No    Physically Abused: No    Sexually Abused: No    FAMILY HISTORY: Family History  Problem Relation Age of Onset   Heart attack Mother    Heart disease Father    Colon cancer Maternal Aunt    Arthritis Maternal Grandmother     ALLERGIES:  has no known allergies.  MEDICATIONS:  Current Outpatient Medications  Medication Sig Dispense Refill   amiodarone  (PACERONE ) 200 MG tablet Take 1 tablet by mouth once daily 90 tablet 2   carvedilol  (COREG ) 25 MG tablet Take 1 tablet (25 mg total) by mouth 2 (two) times daily with a meal. 180 tablet 3   Cholecalciferol (VITAMIN D-3) 125 MCG (5000 UT) TABS Take 5,000 Units by mouth daily.     finasteride  (PROSCAR ) 5 MG tablet Take 1 tablet (5 mg total) by mouth daily. 90 tablet 4   folic acid  (FOLVITE ) 400 MCG tablet Take 400  mcg by mouth daily.     levothyroxine  (SYNTHROID ) 100 MCG tablet Take 1 tablet (100 mcg total) by mouth daily. 60 tablet 3   Multiple Vitamin (MULTIVITAMIN) tablet Take 1 tablet by mouth daily.     olmesartan  (BENICAR ) 40 MG tablet Take 1 tablet (40 mg total) by mouth daily. 90 tablet 4   Zinc 50 MG TABS Take 50 mg by mouth daily.     cyanocobalamin  (VITAMIN B12) 1000 MCG tablet Take 1,000 mcg by mouth daily. (Patient not taking: Reported on 07/28/2023)     No current facility-administered medications for this visit.     PHYSICAL EXAMINATION:   Vitals:   07/28/23 1524  BP: (!) 150/75  Pulse: 70  Resp: 18  Temp: (!) 97 F (36.1 C)  SpO2: 100%    Filed Weights   07/28/23 1524  Weight: 233 lb (105.7 kg)     Physical Exam Vitals and nursing note reviewed.  HENT:     Head: Normocephalic and atraumatic.     Mouth/Throat:     Pharynx: Oropharynx is clear.  Eyes:     Extraocular Movements: Extraocular movements intact.     Pupils: Pupils are  equal, round, and reactive to light.  Cardiovascular:     Rate and Rhythm: Normal rate and regular rhythm.  Pulmonary:     Comments: Decreased breath sounds bilaterally.  Abdominal:     Palpations: Abdomen is soft.  Musculoskeletal:        General: Normal range of motion.     Cervical back: Normal range of motion.  Skin:    General: Skin is warm.  Neurological:     General: No focal deficit present.     Mental Status: He is alert and oriented to person, place, and time.  Psychiatric:        Behavior: Behavior normal.        Judgment: Judgment normal.      LABORATORY DATA:  I have reviewed the data as listed Lab Results  Component Value Date   WBC 6.6 07/08/2023   HGB 9.5 (L) 07/08/2023   HCT 31.2 (L) 07/08/2023   MCV 96.6 07/08/2023   PLT 173 07/08/2023   Recent Labs    04/02/23 0927 04/22/23 0921 05/06/23 0953 07/02/23 1215  NA 146* 142 141 139  K 3.9 4.1 3.9 4.0  CL 108* 106 108 107  CO2 23 23 24 25   GLUCOSE 91 97 117* 123*  BUN 17 16 24* 20  CREATININE 1.27 1.32* 1.23 1.24  CALCIUM 8.8 8.9 8.7* 8.9  GFRNONAA  --   --  >60 60*  PROT 5.7* 5.7*  --  7.0  ALBUMIN 4.2 4.1  --  4.2  AST 21 20  --  25  ALT 19 18  --  23  ALKPHOS 102 91  --  86  BILITOT 0.9 1.2  --  1.8*     DG Bone Survey Met Result Date: 07/28/2023 CLINICAL DATA:  Multiple myeloma. EXAM: METASTATIC BONE SURVEY COMPARISON:  CT scan 02/11/2023 FINDINGS: No lytic myelomatous lesions are identified in the axial or appendicular skeleton. IMPRESSION: No lytic myelomatous lesions are identified. Electronically Signed   By: Marrian Siva M.D.   On: 07/28/2023 17:47   IR BONE MARROW BIOPSY & ASPIRATION Addendum Date: 07/08/2023 ADDENDUM REPORT: 07/08/2023 12:55 ADDENDUM: Correction to the sterile technique. The back was prepped and draped in sterile fashion. Sterile technique was utilized using caps, mask, sterile gloves, sterile drape, hand hygiene and skin  antiseptic. Electronically Signed   By: Elene Griffes M.D.   On: 07/08/2023 12:55   Result Date: 07/08/2023 INDICATION: 78 year old with pancytopenia. EXAM: FLUOROSCOPIC GUIDED BONE MARROW ASPIRATES AND BIOPSY Physician: Olive Better. Henn, MD MEDICATIONS: Moderate sedation ANESTHESIA/SEDATION: Moderate (conscious) sedation was employed during this procedure. A total of Versed  1.5 mg and fentanyl  75 mcg was administered intravenously at the order of the provider performing the procedure. Total intra-service moderate sedation time:  . Patient's level of consciousness and vital signs were monitored continuously by radiology nurse throughout the procedure under the supervision of the provider performing the procedure. FLUOROSCOPY: Radiation Exposure Index (as provided by the fluoroscopic device): 27.4 mGy Kerma COMPLICATIONS: None immediate. PROCEDURE: The procedure was explained to the patient. The risks and benefits of the procedure were discussed and the patient's questions were addressed. Informed consent was obtained from the patient. The patient was placed prone on interventional table. The back was prepped and draped in sterile fashion. Maximal barrier sterile technique was utilized including caps, mask, sterile gowns, sterile gloves, sterile drape, hand hygiene and skin antiseptic. The skin and right posterior ilium were anesthetized with 1% lidocaine . 11 gauge bone needle was directed into the right ilium with fluoroscopic guidance. Two aspirates and 1 core biopsy were obtained. Bandage placed over the puncture site. Fluoroscopic image saved for documentation. IMPRESSION: Fluoroscopic guided bone marrow aspiration and core biopsy. Electronically Signed: By: Elene Griffes M.D. On: 07/08/2023 12:47    ASSESSMENT & PLAN:   Symptomatic anemia # PANCYTOPENIA-[2019- thrombocytopebia hemoglobin- 10; leukopenia- 2.5; no diff. platelets 100s. DEC 2024-CT- [renal]- NO cirrhosis; spleen. 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  The differential diagnosis for hypercellularity/dyspoiesis-concurrent MDS with plasmacytosis/multiple myeloma.  Other possibility likely reactive secondary plasmacytosis. plasmacytosis ranging from 17% (aspirate to focally 70% by CD138 IHC on the biopsy/clot section with an overall average interpreted as 30%.  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  FISH panel for plasma cell neoplasm: 11:14 translocation.NGS for MDS- pending.   # Interestingly -immunofixation shows IgA monoclonal protein with lambda light chain specificity- NOT QUANTIFIABLE. K/L= Normal. Beta 2- 2.8.  X-rays skeletal survey-negative for any bone lesions.  # A.fib [off Eliquis ; Dr.Gollan- sec to hematuria- OCT 2024]- on amiodarone - awaiting watchman device. [Dr.Lambert]  # GU : history of prostate cancer [2011]-status post brachytherapy/hematuria-status post IR ablation improved. No concerns of recurrence.   # DISPOSITION: # follow up TBD-- Dr.B  Addendum: I reviewed with the pathologist-although unable to assess for clonality  of the bone marrow plasma cells.  However given-11:14 translocation-suggestive of clonality positive.  Plasma cells in bone marrow on average about 30% [patchy].  Given the quite not impressive serum electrophoresis/kappa lambda light chain ratio-suggestive of nonsecretory multiple  myeloma.  However MDS still differential diagnosis.  Await NGS. Await NGS.    Cc; Dr.Gollan, Dr.Lambert, Ms.Cannady   All questions were answered. The patient knows to call the clinic with any problems, questions or concerns.  Gwyn Leos, MD 07/29/2023 10:58 PM

## 2023-07-28 NOTE — Progress Notes (Unsigned)
 Patient had a Bone Survey Met done on 07/17/2023, I just called and spoke with radiology in [place] and she told me that it takes them at least 45 minutes to read, so it might not be done by the time patient leaves today.

## 2023-07-30 ENCOUNTER — Telehealth: Payer: Self-pay | Admitting: Internal Medicine

## 2023-07-30 ENCOUNTER — Other Ambulatory Visit: Payer: Self-pay | Admitting: *Deleted

## 2023-07-30 ENCOUNTER — Encounter (HOSPITAL_COMMUNITY): Payer: Self-pay | Admitting: Internal Medicine

## 2023-07-30 DIAGNOSIS — D696 Thrombocytopenia, unspecified: Secondary | ICD-10-CM

## 2023-07-30 DIAGNOSIS — D649 Anemia, unspecified: Secondary | ICD-10-CM

## 2023-07-30 NOTE — Telephone Encounter (Signed)
 I spoke to pathology-differential MDS versus multiple myeloma.  Await NGS.  Recommend follow-up in about 2 weeks or so-MD labs CBC; CMP LDH.   Thanks GB

## 2023-07-31 DIAGNOSIS — D044 Carcinoma in situ of skin of scalp and neck: Secondary | ICD-10-CM | POA: Diagnosis not present

## 2023-08-19 ENCOUNTER — Encounter: Payer: Self-pay | Admitting: Internal Medicine

## 2023-08-19 ENCOUNTER — Inpatient Hospital Stay (HOSPITAL_BASED_OUTPATIENT_CLINIC_OR_DEPARTMENT_OTHER): Admitting: Internal Medicine

## 2023-08-19 ENCOUNTER — Inpatient Hospital Stay: Attending: Internal Medicine

## 2023-08-19 VITALS — BP 154/60 | HR 63 | Temp 97.9°F | Resp 18 | Ht 73.0 in | Wt 232.6 lb

## 2023-08-19 DIAGNOSIS — D61818 Other pancytopenia: Secondary | ICD-10-CM | POA: Insufficient documentation

## 2023-08-19 DIAGNOSIS — Z8546 Personal history of malignant neoplasm of prostate: Secondary | ICD-10-CM | POA: Diagnosis not present

## 2023-08-19 DIAGNOSIS — D4989 Neoplasm of unspecified behavior of other specified sites: Secondary | ICD-10-CM | POA: Diagnosis not present

## 2023-08-19 DIAGNOSIS — D649 Anemia, unspecified: Secondary | ICD-10-CM | POA: Diagnosis not present

## 2023-08-19 DIAGNOSIS — I129 Hypertensive chronic kidney disease with stage 1 through stage 4 chronic kidney disease, or unspecified chronic kidney disease: Secondary | ICD-10-CM | POA: Insufficient documentation

## 2023-08-19 DIAGNOSIS — N189 Chronic kidney disease, unspecified: Secondary | ICD-10-CM | POA: Diagnosis not present

## 2023-08-19 DIAGNOSIS — E039 Hypothyroidism, unspecified: Secondary | ICD-10-CM | POA: Diagnosis not present

## 2023-08-19 DIAGNOSIS — I4819 Other persistent atrial fibrillation: Secondary | ICD-10-CM | POA: Diagnosis not present

## 2023-08-19 DIAGNOSIS — D696 Thrombocytopenia, unspecified: Secondary | ICD-10-CM

## 2023-08-19 LAB — CBC WITH DIFFERENTIAL (CANCER CENTER ONLY)
Abs Immature Granulocytes: 0.07 10*3/uL (ref 0.00–0.07)
Basophils Absolute: 0 10*3/uL (ref 0.0–0.1)
Basophils Relative: 1 %
Eosinophils Absolute: 0 10*3/uL (ref 0.0–0.5)
Eosinophils Relative: 1 %
HCT: 30.3 % — ABNORMAL LOW (ref 39.0–52.0)
Hemoglobin: 9.2 g/dL — ABNORMAL LOW (ref 13.0–17.0)
Immature Granulocytes: 2 %
Lymphocytes Relative: 40 %
Lymphs Abs: 1.3 10*3/uL (ref 0.7–4.0)
MCH: 28.9 pg (ref 26.0–34.0)
MCHC: 30.4 g/dL (ref 30.0–36.0)
MCV: 95.3 fL (ref 80.0–100.0)
Monocytes Absolute: 0.2 10*3/uL (ref 0.1–1.0)
Monocytes Relative: 5 %
Neutro Abs: 1.7 10*3/uL (ref 1.7–7.7)
Neutrophils Relative %: 51 %
Platelet Count: 154 10*3/uL (ref 150–400)
RBC: 3.18 MIL/uL — ABNORMAL LOW (ref 4.22–5.81)
RDW: 19.2 % — ABNORMAL HIGH (ref 11.5–15.5)
Smear Review: NORMAL
WBC Count: 3.3 10*3/uL — ABNORMAL LOW (ref 4.0–10.5)
nRBC: 0 % (ref 0.0–0.2)

## 2023-08-19 LAB — CMP (CANCER CENTER ONLY)
ALT: 27 U/L (ref 0–44)
AST: 30 U/L (ref 15–41)
Albumin: 4 g/dL (ref 3.5–5.0)
Alkaline Phosphatase: 77 U/L (ref 38–126)
Anion gap: 7 (ref 5–15)
BUN: 27 mg/dL — ABNORMAL HIGH (ref 8–23)
CO2: 23 mmol/L (ref 22–32)
Calcium: 8.4 mg/dL — ABNORMAL LOW (ref 8.9–10.3)
Chloride: 110 mmol/L (ref 98–111)
Creatinine: 1.34 mg/dL — ABNORMAL HIGH (ref 0.61–1.24)
GFR, Estimated: 54 mL/min — ABNORMAL LOW (ref >60)
Glucose, Bld: 103 mg/dL — ABNORMAL HIGH (ref 70–99)
Potassium: 4.1 mmol/L (ref 3.5–5.1)
Sodium: 140 mmol/L (ref 135–145)
Total Bilirubin: 1.6 mg/dL — ABNORMAL HIGH (ref 0.0–1.2)
Total Protein: 6 g/dL — ABNORMAL LOW (ref 6.5–8.1)

## 2023-08-19 LAB — LACTATE DEHYDROGENASE: LDH: 199 U/L — ABNORMAL HIGH (ref 98–192)

## 2023-08-19 NOTE — Progress Notes (Signed)
 [facility] CONSULT NOTE  Patient Care Team: Lemar Pyles, NP as PCP - General (Nurse Practitioner) Boyce Byes, MD as PCP - Electrophysiology (Cardiology) Devorah Fonder, MD as Consulting Physician (Cardiology) Dasher, Margette Sheldon, MD as Consulting Physician (Dermatology) [facility]) Gwyn Leos, MD as Consulting Physician (Oncology) Gwyn Leos, MD as Consulting Physician (Hematology and Oncology)  CHIEF COMPLAINTS/PURPOSE OF CONSULTATION: Pancytopenia   HEMATOLOGY HISTORY  # PANCYTOPENIA [platelets-  WBC-  ANC-; Hb- MCV- HIV/Hepatitis: Alcohol; CT: US :   HISTORY OF PRESENTING ILLNESS: Patient ambulating-independently. Accompanied by family/wife.   [name] [name] 78 y.o.  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  Patient admits to mild fatigue.  Otherwise denies any fevers or chills.  No nausea no vomiting.   Review of Systems  Constitutional:  Positive for malaise/fatigue. Negative for chills, diaphoresis, fever and weight loss.  HENT:  Negative for nosebleeds and sore throat.   Eyes:  Negative for double vision.  Respiratory:  Negative for cough, hemoptysis, sputum production, shortness of breath and wheezing.   Cardiovascular:  Negative for chest pain, palpitations, orthopnea and leg swelling.  Gastrointestinal:  Negative for abdominal pain, blood in stool, constipation, diarrhea, heartburn, melena, nausea and vomiting.  Genitourinary:  Negative for dysuria, frequency and urgency.  Musculoskeletal:  Negative for back pain and joint pain.  Skin: Negative.  Negative for itching and rash.  Neurological:  Negative for dizziness, tingling, focal weakness, weakness and  headaches.  Endo/Heme/Allergies:  Does not bruise/bleed easily.  Psychiatric/Behavioral:  Negative for depression. The patient is not nervous/anxious and does not have insomnia.      MEDICAL HISTORY:  Past Medical History:  Diagnosis Date   A-fib [facility])    Atrial fibrillation with RVR (HCC)    Dupuytren's contracture    Essential hypertension 09/05/2014   Hypertension    Hypokalemia    Hypothyroidism    Malignant neoplasm of prostate (HCC) 09/05/2014   prostate,skin of the scalp   Mitral regurgitation    a. echo 03/2015: echo 03/2015: EF 50-55%, no RWMA, no sufficienct to allow for LV diastolic fxn, mild MR, LA mildly dilated at 46 mm, RV systolic fxn nl, PASP nl   Nephrolithiasis    New onset atrial fibrillation (HCC) 04/18/2015   OSA (obstructive sleep apnea) 05/11/2015   Persistent atrial fibrillation (HCC)     SURGICAL HISTORY: Past Surgical History:  Procedure Laterality Date   COLONOSCOPY WITH PROPOFOL  N/A 04/18/2015   Procedure: COLONOSCOPY WITH PROPOFOL ;  Surgeon: Marnee Sink, MD;  Location: [facility] ENDOSCOPY;  Service: Endoscopy;  Laterality: N/A;   ELECTROPHYSIOLOGIC STUDY N/A 05/05/2015   Procedure: CARDIOVERSION;  Surgeon: Wenona Hamilton, MD;  Location: [facility] ORS;  Service: Cardiovascular;  Laterality: N/A;   IR BONE MARROW BIOPSY & ASPIRATION  07/08/2023   IR EMBO TUMOR ORGAN ISCHEMIA INFARCT INC GUIDE ROADMAPPING  05/06/2023   IR RADIOLOGIST EVAL & MGMT  02/18/2023   IR RADIOLOGIST EVAL & MGMT  04/17/2023   IR RADIOLOGIST EVAL & MGMT  05/27/2023   RADIOACTIVE SEED IMPLANT N/A     SOCIAL HISTORY: Social History   Socioeconomic History   Marital status: Married    Spouse name: [name] [name]   Number of children: 2   Years of education: Not on file   Highest education level:  Associate degree: academic program  Occupational History   Occupation: retired     Comment: Acupuncturist  Tobacco Use   Smoking status: Former    Current packs/day: 0.00    Average packs/day: 1  pack/day for 30.5 years (30.5 ttl pk-yrs)    Types: Cigarettes    Start date: 1966    Quit date: 09/05/1994    Years since quitting: 28.9   Smokeless tobacco: Former    Types: Chew    Quit date: 09/04/2004  Vaping Use   Vaping status: Never Used  Substance and Sexual Activity   Alcohol use: No   Drug use: No   Sexual activity: Not Currently  Other Topics Concern   Not on file  Social History Narrative   Plays racket ball twice a week, works outside. Indoor pets, dog and cat.    Social Drivers of Corporate investment banker Strain: Low Risk  (11/19/2022)   Overall Financial Resource Strain (CARDIA)    Difficulty of Paying Living Expenses: Not hard at all  Food Insecurity: No Food Insecurity (11/19/2022)   Hunger Vital Sign    Worried About Running Out of Food in the Last Year: Never true    Ran Out of Food in the Last Year: Never true  Transportation Needs: No Transportation Needs (11/19/2022)   PRAPARE - Administrator, Civil Service (Medical): No    Lack of Transportation (Non-Medical): No  Physical Activity: Sufficiently Active (11/19/2022)   Exercise Vital Sign    Days of Exercise per Week: 2 days    Minutes of Exercise per Session: 120 min  Stress: No Stress Concern Present (11/19/2022)   [employer] of Occupational Health - Occupational Stress Questionnaire    Feeling of Stress : Not at all  Social Connections: Socially Integrated (11/19/2022)   Social Connection and Isolation Panel [NHANES]    Frequency of Communication with Friends and Family: Three times a week    Frequency of Social Gatherings with Friends and Family: More than three times a week    Attends Religious Services: More than 4 times per year    Active Member of [employer] or Organizations: Yes    Attends Banker Meetings: Never    Marital Status: Married  Catering manager Violence: Not At Risk (11/19/2022)   Humiliation, Afraid, Rape, and Kick questionnaire    Fear of Current or  Ex-Partner: No    Emotionally Abused: No    Physically Abused: No    Sexually Abused: No    FAMILY HISTORY: Family History  Problem Relation Age of Onset   Heart attack Mother    Heart disease Father    Colon cancer Maternal Aunt    Arthritis Maternal Grandmother     ALLERGIES:  has no known allergies.  MEDICATIONS:  Current Outpatient Medications  Medication Sig Dispense Refill   amiodarone  (PACERONE ) 200 MG tablet Take 1 tablet by mouth once daily 90 tablet 2   carvedilol  (COREG ) 25 MG tablet Take 1 tablet (25 mg total) by mouth 2 (two) times daily with a meal. 180 tablet 3   Cholecalciferol (VITAMIN D-3) 125 MCG (5000 UT) TABS Take 5,000 Units by mouth daily.     finasteride  (PROSCAR ) 5 MG tablet Take 1 tablet (5 mg total) by mouth daily. 90 tablet 4   folic acid  (FOLVITE ) 400 MCG tablet Take 400 mcg by mouth daily.     levothyroxine  (SYNTHROID ) 100 MCG tablet Take 1 tablet (100 mcg total) by mouth  daily. 60 tablet 3   Multiple Vitamin (MULTIVITAMIN) tablet Take 1 tablet by mouth daily.     olmesartan  (BENICAR ) 40 MG tablet Take 1 tablet (40 mg total) by mouth daily. 90 tablet 4   Zinc 50 MG TABS Take 50 mg by mouth daily.     cyanocobalamin  (VITAMIN B12) 1000 MCG tablet Take 1,000 mcg by mouth daily. (Patient not taking: Reported on 07/23/2023)     No current facility-administered medications for this visit.     PHYSICAL EXAMINATION:   Vitals:   08/19/23 1506  BP: (!) 154/60  Pulse: 63  Resp: 18  Temp: 97.9 F (36.6 C)  SpO2: 100%    Filed Weights   08/19/23 1506  Weight: 232 lb 9.6 oz (105.5 kg)     Physical Exam Vitals and nursing note reviewed.  HENT:     Head: Normocephalic and atraumatic.     Mouth/Throat:     Pharynx: Oropharynx is clear.  Eyes:     Extraocular Movements: Extraocular movements intact.     Pupils: Pupils are equal, round, and reactive to light.  Cardiovascular:     Rate and Rhythm: Normal rate and regular rhythm.  Pulmonary:      Comments: Decreased breath sounds bilaterally.  Abdominal:     Palpations: Abdomen is soft.  Musculoskeletal:        General: Normal range of motion.     Cervical back: Normal range of motion.  Skin:    General: Skin is warm.  Neurological:     General: No focal deficit present.     Mental Status: He is alert and oriented to person, place, and time.  Psychiatric:        Behavior: Behavior normal.        Judgment: Judgment normal.      LABORATORY DATA:  I have reviewed the data as listed Lab Results  Component Value Date   WBC 3.3 (L) 08/19/2023   HGB 9.2 (L) 08/19/2023   HCT 30.3 (L) 08/19/2023   MCV 95.3 08/19/2023   PLT 154 08/19/2023   Recent Labs    04/22/23 0921 05/06/23 0953 07/02/23 1215 08/19/23 1500  NA 142 141 139 140  K 4.1 3.9 4.0 4.1  CL 106 108 107 110  CO2 23 24 25 23   GLUCOSE 97 117* 123* 103*  BUN 16 24* 20 27*  CREATININE 1.32* 1.23 1.24 1.34*  CALCIUM 8.9 8.7* 8.9 8.4*  GFRNONAA  --  >60 60* 54*  PROT 5.7*  --  7.0 6.0*  ALBUMIN 4.1  --  4.2 4.0  AST 20  --  25 30  ALT 18  --  23 27  ALKPHOS 91  --  86 77  BILITOT 1.2  --  1.8* 1.6*     No results found.   ASSESSMENT & PLAN:   Symptomatic anemia # PANCYTOPENIA-[2019- thrombocytopebia hemoglobin- 10; leukopenia- 2.5; no diff. platelets 100s. DEC 2024-CT- [renal]- NO cirrhosis; spleen. 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  The differential diagnosis for hypercellularity/dyspoiesis-concurrent MDS with plasmacytosis/multiple myeloma.  Other possibility likely reactive secondary plasmacytosis. plasmacytosis ranging from 17% (aspirate to focally 70% by CD138 IHC on the biopsy/clot section with an overall average  interpreted as 30%.  In addition there is aberrant cyclin D1  expression as well as apparent CD117 expression; however, definitive clonality could not be established given poor kappa/lambda ISH staining; however, this is interpreted to most likely represent a plasma cell neoplasm Addendum: I reviewed with the pathologist-although unable to assess for clonality  of the bone marrow plasma cells.  However given-11:14 translocation-suggestive of clonality positive.  Plasma cells in bone marrow on average about 30% [patchy].  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  FISH panel for plasma cell neoplasm: 11:14 translocation. NGS for MDS- ASXL- NF-1; STAG-2; TET-2   # Interestingly -immunofixation shows IgA monoclonal protein with lambda light chain specificity- NOT QUANTIFIABLE. K/L= Normal. Beta 2- 2.8.  X-rays skeletal survey-negative for any bone lesions.  # I reviewed the patient and wife regarding complicated bone marrow biopsy results-concerning for concurrent nonsecretory multiple myeloma [vs. Reactive process] in the context of MDS-low-grade-with mild/slow worsening anemia-given the complicated results of the bone marrow biopsy would recommend evaluation for second opinion.  Preference [facility] will make a referral.  Recommend whole-body PET scan for further evaluation.   # A.fib [off Eliquis ; Dr.Gollan- sec to hematuria- OCT 2024]- on amiodarone - awaiting watchman device. [Dr.Lambert]  # GU : history of prostate cancer [2011]-status post brachytherapy/hematuria-status post IR ablation improved. No concerns of recurrence.   # DISPOSITION: # referral to referral to [facility] hematology- MDS-non-secretory Multiple myeloma ASAP # follow up in 6 weeks- MD; labs- cbc/cmp; LDH; retic count; haptoglobin-  Dr.B  Cc; Dr.Gollan, Dr.Lambert, Ms.Cannady   All questions were answered. The patient knows to call the  clinic with any problems, questions or concerns.   Gwyn Leos, MD 08/19/2023 4:36 PM

## 2023-08-19 NOTE — Progress Notes (Signed)
 Fatigue/weakness: NO Dyspena: NO  Light headedness: NO  Blood in stool: NO

## 2023-08-19 NOTE — Assessment & Plan Note (Addendum)
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 platelets 100s. DEC 2024-CT- [renal]- NO cirrhosis; spleen. 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  The differential diagnosis for hypercellularity/dyspoiesis-concurrent MDS with plasmacytosis/multiple myeloma.  Other possibility likely reactive secondary plasmacytosis. plasmacytosis ranging from 17% (aspirate to focally 70% by CD138 IHC on the biopsy/clot section with an overall average interpreted as 30%.  In addition there is aberrant cyclin D1 expression as well as apparent CD117 expression; however, definitive clonality could not be established given poor kappa/lambda ISH staining; however, this is interpreted to most likely represent a plasma cell neoplasm Addendum: I reviewed with the pathologist-although unable to assess for clonality  of the bone marrow plasma cells.  However given-11:14 translocation-suggestive of clonality positive.  Plasma cells in bone marrow on average about 30% [patchy].  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  FISH panel for plasma cell neoplasm: 11:14 translocation. NGS for MDS- ASXL- NF-1; STAG-2; TET-2   # Interestingly -immunofixation shows IgA monoclonal protein with lambda light chain specificity- NOT QUANTIFIABLE. K/L= Normal. Beta 2- 2.8.  X-rays skeletal survey-negative for any bone lesions.  # I reviewed the patient and wife regarding complicated bone marrow biopsy results-concerning for concurrent nonsecretory multiple myeloma [vs. Reactive process] in the context of MDS-low-grade-with mild/slow worsening  anemia-given the complicated results of the bone marrow biopsy would recommend evaluation for second opinion.  Preference [facility] will make a referral.  Recommend whole-body PET scan for further evaluation.   # A.fib [off Eliquis ; Dr.Gollan- sec to hematuria- OCT 2024]- on amiodarone - awaiting watchman device. [Dr.Lambert]  # GU : history of prostate cancer [2011]-status post brachytherapy/hematuria-status post IR ablation improved. No concerns of recurrence.   PET scan- ordered.  # DISPOSITION: # referral to referral to [facility] hematology- MDS-non-secretory Multiple myeloma ASAP # follow up in 6 weeks- MD; labs- cbc/cmp; LDH; retic count; haptoglobin-  Dr.B  Cc; Dr.Gollan, Dr.Lambert, Ms.[employer]

## 2023-08-20 ENCOUNTER — Telehealth: Payer: Self-pay | Admitting: *Deleted

## 2023-08-20 ENCOUNTER — Ambulatory Visit
Admission: RE | Admit: 2023-08-20 | Discharge: 2023-08-20 | Disposition: A | Discharge status: Home / Self Care | Source: Ambulatory Visit | Attending: Emergency Medicine | Admitting: Emergency Medicine

## 2023-08-20 VITALS — BP 156/77 | HR 69 | Temp 98.3°F | Resp 18

## 2023-08-20 DIAGNOSIS — I10 Essential (primary) hypertension: Secondary | ICD-10-CM | POA: Diagnosis not present

## 2023-08-20 DIAGNOSIS — H60501 Unspecified acute noninfective otitis externa, right ear: Secondary | ICD-10-CM | POA: Diagnosis not present

## 2023-08-20 DIAGNOSIS — H6123 Impacted cerumen, bilateral: Secondary | ICD-10-CM | POA: Diagnosis not present

## 2023-08-20 MED ORDER — OFLOXACIN 0.3 % OT SOLN: 10.0000 [drp] | Freq: Every day | OTIC | 0 refills | Status: DC

## 2023-08-20 NOTE — ED Triage Notes (Signed)
 Pt reports ear fullness x 3 weeks, L worse than R. Occurred after having a cold. Attempted lavage at home with no relief. No drainage or fevers, muffled hearing in L.

## 2023-08-20 NOTE — Discharge Instructions (Addendum)
 Your blood pressure is elevated today at 177/102.  Please have this rechecked by your primary care provider tomorrow.      Your earwax was removed via irrigation today.  Use the antibiotic ear drops as directed.

## 2023-08-20 NOTE — ED Provider Notes (Signed)
 [name] [name]    CSN: [id number] Arrival date & time: 08/20/23  1410      History   Chief Complaint Chief Complaint  Patient presents with   Ear Fullness    APPT 2:30     HPI [name] [name] is a 78 y.o. male.  Patient presents with bilateral ear fullness, worse on the left side.  Treatment attempted at home with ear lavage without success.  He states his hearing is muffled.  He denies fever, chills, ear drainage, cough, shortness of breath, chest pain, focal weakness.  His medical history includes hypertension.  Patient states his blood pressure is only high when he is in a medical office.  He takes his blood pressure at home and reports it is normally in the 120s/70s.  The history is provided by the patient and medical records.    Past Medical History:  Diagnosis Date   A-fib [facility])    Atrial fibrillation with RVR (HCC)    Dupuytren's contracture    Essential hypertension 09/05/2014   Hypertension    Hypokalemia    Hypothyroidism    Malignant neoplasm of prostate (HCC) 09/05/2014   prostate,skin of the scalp   Mitral regurgitation    a. echo 03/2015: echo 03/2015: EF 50-55%, no RWMA, no sufficienct to allow for LV diastolic fxn, mild MR, LA mildly dilated at 46 mm, RV systolic fxn nl, PASP nl   Nephrolithiasis    New onset atrial fibrillation (HCC) 04/18/2015   OSA (obstructive sleep apnea) 05/11/2015   Persistent atrial fibrillation [facility])     Patient Active Problem List   Diagnosis Date Noted   Symptomatic anemia 07/16/2023   BPH associated with nocturia 11/08/2020   Other thrombophilia (HCC) 11/04/2020   CKD (chronic kidney disease), stage III (HCC) 11/04/2020   Aortic atherosclerosis (HCC) 11/03/2020   Asymptomatic microscopic hematuria 10/11/2020   History of prostate cancer 04/13/2020   BMI 29.0-29.9,adult 10/05/2019   Hyperlipidemia LDL goal <70 10/05/2019   Advanced care planning/counseling discussion 09/12/2016   OSA (obstructive sleep apnea)  05/11/2015   Atrial fibrillation with RVR (HCC)    Essential hypertension 09/05/2014   Dupuytren's contracture 09/05/2014   Hypothyroidism 09/05/2014    Past Surgical History:  Procedure Laterality Date   COLONOSCOPY WITH PROPOFOL  N/A 04/18/2015   Procedure: COLONOSCOPY WITH PROPOFOL ;  Surgeon: Marnee Sink, MD;  Location: [facility] ENDOSCOPY;  Service: Endoscopy;  Laterality: N/A;   ELECTROPHYSIOLOGIC STUDY N/A 05/05/2015   Procedure: CARDIOVERSION;  Surgeon: Wenona Hamilton, MD;  Location: [facility] ORS;  Service: Cardiovascular;  Laterality: N/A;   IR BONE MARROW BIOPSY & ASPIRATION  07/08/2023   IR EMBO TUMOR ORGAN ISCHEMIA INFARCT INC GUIDE ROADMAPPING  05/06/2023   IR RADIOLOGIST EVAL & MGMT  02/18/2023   IR RADIOLOGIST EVAL & MGMT  04/17/2023   IR RADIOLOGIST EVAL & MGMT  05/27/2023   RADIOACTIVE SEED IMPLANT N/A        Home Medications    Prior to Admission medications   Medication Sig Start Date End Date Taking? Authorizing Provider  ofloxacin (FLOXIN) 0.3 % OTIC solution Place 10 drops into the right ear daily. 08/20/23  Yes Wellington Half, NP  amiodarone  (PACERONE ) 200 MG tablet Take 1 tablet by mouth once daily 04/28/23   Gollan, Timothy J, MD  carvedilol  (COREG ) 25 MG tablet Take 1 tablet (25 mg total) by mouth 2 (two) times daily with a meal. 06/25/23   Gollan, Deadra Everts, MD  Cholecalciferol (VITAMIN D-3) 125 MCG (5000  UT) TABS Take 5,000 Units by mouth daily.    [provider]  cyanocobalamin  (VITAMIN B12) 1000 MCG tablet Take 1,000 mcg by mouth daily. Patient not taking: Reported on 07/23/2023    [provider]  finasteride  (PROSCAR ) 5 MG tablet Take 1 tablet (5 mg total) by mouth daily. 06/19/23   Lawerence Pressman, MD  folic acid  (FOLVITE ) 400 MCG tablet Take 400 mcg by mouth daily.    [provider]  levothyroxine  (SYNTHROID ) 100 MCG tablet Take 1 tablet (100 mcg total) by mouth daily. 04/23/23   Cannady, Jolene T, NP  Multiple Vitamin (MULTIVITAMIN) tablet  Take 1 tablet by mouth daily.    [provider]  olmesartan  (BENICAR ) 40 MG tablet Take 1 tablet (40 mg total) by mouth daily. 10/15/22   Cannady, Jolene T, NP  Zinc 50 MG TABS Take 50 mg by mouth daily.    [provider]    Family History Family History  Problem Relation Age of Onset   Heart attack Mother    Heart disease Father    Colon cancer Maternal Aunt    Arthritis Maternal Grandmother     Social History Social History   Tobacco Use   Smoking status: Former    Current packs/day: 0.00    Average packs/day: 1 pack/day for 30.5 years (30.5 ttl pk-yrs)    Types: Cigarettes    Start date: 1966    Quit date: 09/05/1994    Years since quitting: 28.9   Smokeless tobacco: Former    Types: Chew    Quit date: 09/04/2004  Vaping Use   Vaping status: Never Used  Substance Use Topics   Alcohol use: No   Drug use: No     Allergies   Patient has no known allergies.   Review of Systems Review of Systems  Constitutional:  Negative for chills and fever.  HENT:  Positive for hearing loss. Negative for ear discharge, ear pain and sore throat.   Respiratory:  Negative for cough and shortness of breath.   Cardiovascular:  Negative for chest pain and palpitations.  Neurological:  Negative for weakness and numbness.     Physical Exam Triage Vital Signs ED Triage Vitals  Encounter Vitals Group     BP 08/20/23 1431 (!) 177/102     Systolic BP Percentile --      Diastolic BP Percentile --      Pulse Rate 08/20/23 1431 76     Resp 08/20/23 1431 18     Temp 08/20/23 1431 98.3 F (36.8 C)     Temp Source 08/20/23 1431 Oral     SpO2 08/20/23 1431 98 %     Weight --      Height --      Head Circumference --      Peak Flow --      Pain Score 08/20/23 1428 1     Pain Loc --      Pain Education --      Exclude from Growth Chart --    No data found.  Updated Vital Signs BP (!) 156/77 (BP Location: Left Arm)   Pulse 69   Temp 98.3 F (36.8 C) (Oral)    Resp 18   SpO2 98%   Visual Acuity Right Eye Distance:   Left Eye Distance:   Bilateral Distance:    Right Eye Near:   Left Eye Near:    Bilateral Near:     Physical Exam Constitutional:  General: He is not in acute distress. HENT:     Right Ear: There is impacted cerumen.     Left Ear: There is impacted cerumen.     Nose: Nose normal.     Mouth/Throat:     Mouth: Mucous membranes are moist.     Pharynx: Oropharynx is clear.  Cardiovascular:     Rate and Rhythm: Normal rate and regular rhythm.     Heart sounds: Normal heart sounds.  Pulmonary:     Effort: Pulmonary effort is normal. No respiratory distress.     Breath sounds: Normal breath sounds.  Neurological:     General: No focal deficit present.     Mental Status: He is alert and oriented to person, place, and time.     Sensory: No sensory deficit.     Motor: No weakness.     Gait: Gait normal.      UC Treatments / Results  Labs (all labs ordered are listed, but only abnormal results are displayed) Labs Reviewed - No data to display  EKG   Radiology No results found.  Procedures Procedures (including critical care time)  Medications Ordered in UC Medications - No data to display  Initial Impression / Assessment and Plan / UC Course  I have reviewed the triage vital signs and the nursing notes.  Pertinent labs & imaging results that were available during my care of the patient were reviewed by me and considered in my medical decision making (see chart for details).    Bilateral cerumen impaction, right otitis externa, elevated blood pressure reading with hypertension.  Cerumen removed via irrigation.  Right ear canal noted to be erythematous after cerumen removal.  Patient reports mild discomfort.  Treating with ofloxacin eardrops.  Education provided on cerumen impaction and otitis externa.  Also discussed with patient that his blood pressure is elevated today and needs to be rechecked by his PCP.   Education provided on managing hypertension.  Patient reports that his blood pressure is only elevated when he is in a medical clinic and that his blood pressures at home generally run 120/70.  He agrees to plan of care.  Final Clinical Impressions(s) / UC Diagnoses   Final diagnoses:  Bilateral impacted cerumen  Elevated blood pressure reading in office with diagnosis of hypertension  Acute otitis externa of right ear, unspecified type     Discharge Instructions      Your blood pressure is elevated today at 177/102.  Please have this rechecked by your primary care provider tomorrow.      Your earwax was removed via irrigation today.  Use the antibiotic ear drops as directed.        ED Prescriptions     Medication Sig Dispense Auth. Provider   ofloxacin (FLOXIN) 0.3 % OTIC solution Place 10 drops into the right ear daily. 5 mL Wellington Half, NP      PDMP not reviewed this encounter.   Wellington Half, NP 08/20/23 [phone]

## 2023-08-20 NOTE — Telephone Encounter (Signed)
 Referral entered and medical records faxed to [facility] for MDS non secretory Multiple Myeloma

## 2023-08-25 ENCOUNTER — Ambulatory Visit
Admission: RE | Admit: 2023-08-25 | Discharge: 2023-08-25 | Disposition: A | Discharge status: Home / Self Care | Source: Ambulatory Visit | Attending: Internal Medicine | Admitting: Internal Medicine

## 2023-08-25 DIAGNOSIS — D649 Anemia, unspecified: Secondary | ICD-10-CM | POA: Insufficient documentation

## 2023-08-25 DIAGNOSIS — Z8546 Personal history of malignant neoplasm of prostate: Secondary | ICD-10-CM | POA: Insufficient documentation

## 2023-08-25 DIAGNOSIS — D4989 Neoplasm of unspecified behavior of other specified sites: Secondary | ICD-10-CM | POA: Diagnosis not present

## 2023-08-25 DIAGNOSIS — C61 Malignant neoplasm of prostate: Secondary | ICD-10-CM | POA: Diagnosis not present

## 2023-08-25 DIAGNOSIS — C9000 Multiple myeloma not having achieved remission: Secondary | ICD-10-CM | POA: Insufficient documentation

## 2023-08-25 LAB — GLUCOSE, CAPILLARY: Glucose-Capillary: 122 mg/dL — ABNORMAL HIGH (ref 70–99)

## 2023-08-25 MED ORDER — FLUDEOXYGLUCOSE F - 18 (FDG) INJECTION
12.5500 | Freq: Once | INTRAVENOUS | Status: AC | PRN
  Administered 2023-08-25: 12.55 via INTRAVENOUS

## 2023-09-03 ENCOUNTER — Telehealth: Payer: Self-pay | Admitting: Internal Medicine

## 2023-09-03 NOTE — Telephone Encounter (Signed)
 Called pt to review the results of the PET scan- No specific areas of abnormal uptake along lymph node chains or skeleton.; other incidental throid nodule noted- will need work up later.  Awaiting appt at [facility] on 7/13-   Follow up as planned on july15th- GB

## 2023-09-22 ENCOUNTER — Telehealth: Payer: Self-pay | Admitting: *Deleted

## 2023-09-22 NOTE — Telephone Encounter (Signed)
 Patient to see Dr Odella and have lab work at [facility] prior to appointment with  Dr Rennie on 09/30/23.  Patient wanting to know if he still needs lab work on the 7/15.

## 2023-09-29 DIAGNOSIS — I480 Paroxysmal atrial fibrillation: Secondary | ICD-10-CM | POA: Diagnosis not present

## 2023-09-29 DIAGNOSIS — E039 Hypothyroidism, unspecified: Secondary | ICD-10-CM | POA: Diagnosis not present

## 2023-09-29 DIAGNOSIS — Z87891 Personal history of nicotine dependence: Secondary | ICD-10-CM | POA: Diagnosis not present

## 2023-09-29 DIAGNOSIS — Z79890 Hormone replacement therapy (postmenopausal): Secondary | ICD-10-CM | POA: Diagnosis not present

## 2023-09-29 DIAGNOSIS — D4989 Neoplasm of unspecified behavior of other specified sites: Secondary | ICD-10-CM | POA: Insufficient documentation

## 2023-09-29 DIAGNOSIS — C61 Malignant neoplasm of prostate: Secondary | ICD-10-CM | POA: Diagnosis not present

## 2023-09-29 DIAGNOSIS — C9000 Multiple myeloma not having achieved remission: Secondary | ICD-10-CM | POA: Diagnosis not present

## 2023-09-29 DIAGNOSIS — D469 Myelodysplastic syndrome, unspecified: Secondary | ICD-10-CM | POA: Diagnosis not present

## 2023-09-29 DIAGNOSIS — I10 Essential (primary) hypertension: Secondary | ICD-10-CM | POA: Diagnosis not present

## 2023-09-30 ENCOUNTER — Encounter: Payer: Self-pay | Admitting: Internal Medicine

## 2023-09-30 ENCOUNTER — Other Ambulatory Visit

## 2023-09-30 ENCOUNTER — Inpatient Hospital Stay: Attending: Internal Medicine | Admitting: Internal Medicine

## 2023-09-30 VITALS — BP 157/74 | HR 62 | Temp 97.0°F | Resp 12 | Ht 73.0 in | Wt 232.2 lb

## 2023-09-30 DIAGNOSIS — E041 Nontoxic single thyroid nodule: Secondary | ICD-10-CM | POA: Diagnosis not present

## 2023-09-30 DIAGNOSIS — D61818 Other pancytopenia: Secondary | ICD-10-CM | POA: Diagnosis not present

## 2023-09-30 DIAGNOSIS — E039 Hypothyroidism, unspecified: Secondary | ICD-10-CM | POA: Diagnosis not present

## 2023-09-30 DIAGNOSIS — I4819 Other persistent atrial fibrillation: Secondary | ICD-10-CM | POA: Insufficient documentation

## 2023-09-30 DIAGNOSIS — Z8546 Personal history of malignant neoplasm of prostate: Secondary | ICD-10-CM | POA: Insufficient documentation

## 2023-09-30 DIAGNOSIS — D649 Anemia, unspecified: Secondary | ICD-10-CM

## 2023-09-30 NOTE — Patient Instructions (Signed)
#  Recommend gentle iron [iron biglycinate; 28 mg ] 1 pill a day.  This pill is unlikely to cause stomach upset or cause constipation. Available Over the counter or talk to pharmacist.

## 2023-09-30 NOTE — Progress Notes (Signed)
 Fatigue/weakness: YES Dyspena: NO Light headedness: NO  Blood in stool: NO   PET 08/25/23.

## 2023-09-30 NOTE — Progress Notes (Signed)
 [facility] CONSULT NOTE  Patient Care Team: Valerio Melanie DASEN, NP as PCP - General (Nurse Practitioner) Cindie Ole DASEN, MD as PCP - Electrophysiology (Cardiology) Perla Evalene PARAS, MD as Consulting Physician (Cardiology) Dasher, Alm LABOR, MD as Consulting Physician (Dermatology) Elma Zachary RAMAN [facility]) Rennie [name] SAUNDERS, MD as Consulting Physician (Oncology) Rennie [name] SAUNDERS, MD as Consulting Physician (Hematology and Oncology)  CHIEF COMPLAINTS/PURPOSE OF CONSULTATION: Pancytopenia   HEMATOLOGY HISTORY  # PANCYTOPENIA [platelets-  WBC-  ANC-; Hb- MCV- HIV/Hepatitis: Alcohol; CT: US :   HISTORY OF PRESENTING ILLNESS: Patient ambulating-independently. Accompanied by family/wife.   [name] [name] 78 y.o.  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  Status post evaluation at [facility] yesterday.   Patient continues to complain of mild-moderate fatigue.  Otherwise denies any fevers or chills.  No nausea no vomiting.   Review of Systems  Constitutional:  Positive for malaise/fatigue. Negative for chills, diaphoresis, fever and weight loss.  HENT:  Negative for nosebleeds and sore throat.   Eyes:  Negative for double vision.  Respiratory:  Negative for cough, hemoptysis, sputum production, shortness of breath and wheezing.   Cardiovascular:  Negative for chest pain, palpitations, orthopnea and leg swelling.  Gastrointestinal:  Negative for abdominal pain, blood in stool, constipation, diarrhea, heartburn, melena, nausea and vomiting.  Genitourinary:  Negative for dysuria, frequency and urgency.  Musculoskeletal:  Negative for back pain and joint pain.  Skin: Negative.  Negative for itching and rash.  Neurological:  Negative for dizziness,  tingling, focal weakness, weakness and headaches.  Endo/Heme/Allergies:  Does not bruise/bleed easily.  Psychiatric/Behavioral:  Negative for depression. The patient is not nervous/anxious and does not have insomnia.      MEDICAL HISTORY:  Past Medical History:  Diagnosis Date   A-fib [facility])    Atrial fibrillation with RVR (HCC)    Dupuytren's contracture    Essential hypertension 09/05/2014   Hypertension    Hypokalemia    Hypothyroidism    Malignant neoplasm of prostate (HCC) 09/05/2014   prostate,skin of the scalp   Mitral regurgitation    a. echo 03/2015: echo 03/2015: EF 50-55%, no RWMA, no sufficienct to allow for LV diastolic fxn, mild MR, LA mildly dilated at 46 mm, RV systolic fxn nl, PASP nl   Nephrolithiasis    New onset atrial fibrillation (HCC) 04/18/2015   OSA (obstructive sleep apnea) 05/11/2015   Persistent atrial fibrillation (HCC)     SURGICAL HISTORY: Past Surgical History:  Procedure Laterality Date   COLONOSCOPY WITH PROPOFOL  N/A 04/18/2015   Procedure: COLONOSCOPY WITH PROPOFOL ;  Surgeon: Rogelia Copping, MD;  Location: [facility] ENDOSCOPY;  Service: Endoscopy;  Laterality: N/A;   ELECTROPHYSIOLOGIC STUDY N/A 05/05/2015   Procedure: CARDIOVERSION;  Surgeon: Deatrice LABOR Cage, MD;  Location: [facility] ORS;  Service: Cardiovascular;  Laterality: N/A;   IR BONE MARROW BIOPSY & ASPIRATION  07/08/2023   IR EMBO TUMOR ORGAN ISCHEMIA INFARCT INC GUIDE ROADMAPPING  05/06/2023   IR RADIOLOGIST EVAL & MGMT  02/18/2023   IR RADIOLOGIST EVAL & MGMT  04/17/2023   IR RADIOLOGIST EVAL & MGMT  05/27/2023   RADIOACTIVE SEED IMPLANT N/A     SOCIAL HISTORY: Social History   Socioeconomic History   Marital status: Married    Spouse name: [name]   Number of children: 2   Years of education:  Not on file   Highest education level: Associate degree: academic program  Occupational History   Occupation: retired     Comment: Acupuncturist  Tobacco Use   Smoking status: Former     Current packs/day: 0.00    Average packs/day: 1 pack/day for 30.5 years (30.5 ttl pk-yrs)    Types: Cigarettes    Start date: 1966    Quit date: 09/05/1994    Years since quitting: 29.0   Smokeless tobacco: Former    Types: Chew    Quit date: 09/04/2004  Vaping Use   Vaping status: Never Used  Substance and Sexual Activity   Alcohol use: No   Drug use: No   Sexual activity: Not Currently  Other Topics Concern   Not on file  Social History Narrative   Plays racket ball twice a week, works outside. Indoor pets, dog and cat.    Social Drivers of Corporate investment banker Strain: Low Risk  (09/02/2023)   Received from [facility]   Overall Financial Resource Strain (CARDIA)    How hard is it for you to pay for the very basics like food, housing, medical care, and heating?: Not very hard  Food Insecurity: No Food Insecurity (09/02/2023)   Received from [facility]   Hunger Vital Sign    Within the past 12 months, you worried that your food would run out before you got the money to buy more.: Never true    Within the past 12 months, the food you bought just didn't last and you didn't have money to get more.: Never true  Transportation Needs: No Transportation Needs (09/02/2023)   Received from [facility]   PRAPARE - Transportation    Lack of Transportation (Medical): No    Lack of Transportation (Non-Medical): No  Physical Activity: Sufficiently Active (11/19/2022)   Exercise Vital Sign    Days of Exercise per Week: 2 days    Minutes of Exercise per Session: 120 min  Stress: No Stress Concern Present (11/19/2022)   [employer] of Occupational Health - Occupational Stress Questionnaire    Feeling of Stress : Not at all  Social Connections: Socially Integrated (11/19/2022)   Social Connection and Isolation Panel    Frequency of Communication with Friends and Family: Three times a week    Frequency of Social Gatherings with Friends and Family: More than three times a  week    Attends Religious Services: More than 4 times per year    Active Member of [employer] or Organizations: Yes    Attends Banker Meetings: Never    Marital Status: Married  Catering manager Violence: Not At Risk (11/19/2022)   Humiliation, Afraid, Rape, and Kick questionnaire    Fear of Current or Ex-Partner: No    Emotionally Abused: No    Physically Abused: No    Sexually Abused: No    FAMILY HISTORY: Family History  Problem Relation Age of Onset   Heart attack Mother    Heart disease Father    Colon cancer Maternal Aunt    Arthritis Maternal Grandmother     ALLERGIES:  has no known allergies.  MEDICATIONS:  Current Outpatient Medications  Medication Sig Dispense Refill   amiodarone  (PACERONE ) 200 MG tablet Take 1 tablet by mouth once daily 90 tablet 2   carvedilol  (COREG ) 25 MG tablet Take 1 tablet (25 mg total) by mouth 2 (two) times daily with a meal. 180 tablet 3   Cholecalciferol (VITAMIN  D-3) 125 MCG (5000 UT) TABS Take 5,000 Units by mouth daily.     finasteride  (PROSCAR ) 5 MG tablet Take 1 tablet (5 mg total) by mouth daily. 90 tablet 4   folic acid  (FOLVITE ) 400 MCG tablet Take 400 mcg by mouth daily.     levothyroxine  (SYNTHROID ) 100 MCG tablet Take 1 tablet (100 mcg total) by mouth daily. 60 tablet 3   Multiple Vitamin (MULTIVITAMIN) tablet Take 1 tablet by mouth daily.     olmesartan  (BENICAR ) 40 MG tablet Take 1 tablet (40 mg total) by mouth daily. 90 tablet 4   Zinc 50 MG TABS Take 50 mg by mouth daily.     cyanocobalamin  (VITAMIN B12) 1000 MCG tablet Take 1,000 mcg by mouth daily. (Patient not taking: Reported on 09/30/2023)     No current facility-administered medications for this visit.     PHYSICAL EXAMINATION:   Vitals:   09/30/23 1313 09/30/23 1323  BP: (!) 156/81 (!) 157/74  Pulse: 62   Resp: 12   Temp: (!) 97 F (36.1 C)   SpO2: 100%     Filed Weights   09/30/23 1313  Weight: 232 lb 3.2 oz (105.3 kg)     Physical  Exam Vitals and nursing note reviewed.  HENT:     Head: Normocephalic and atraumatic.     Mouth/Throat:     Pharynx: Oropharynx is clear.  Eyes:     Extraocular Movements: Extraocular movements intact.     Pupils: Pupils are equal, round, and reactive to light.  Cardiovascular:     Rate and Rhythm: Normal rate and regular rhythm.  Pulmonary:     Comments: Decreased breath sounds bilaterally.  Abdominal:     Palpations: Abdomen is soft.  Musculoskeletal:        General: Normal range of motion.     Cervical back: Normal range of motion.  Skin:    General: Skin is warm.  Neurological:     General: No focal deficit present.     Mental Status: He is alert and oriented to person, place, and time.  Psychiatric:        Behavior: Behavior normal.        Judgment: Judgment normal.      LABORATORY DATA:  I have reviewed the data as listed Lab Results  Component Value Date   WBC 3.3 (L) 08/19/2023   HGB 9.2 (L) 08/19/2023   HCT 30.3 (L) 08/19/2023   MCV 95.3 08/19/2023   PLT 154 08/19/2023   Recent Labs    04/22/23 0921 05/06/23 0953 07/02/23 1215 08/19/23 1500  NA 142 141 139 140  K 4.1 3.9 4.0 4.1  CL 106 108 107 110  CO2 23 24 25 23   GLUCOSE 97 117* 123* 103*  BUN 16 24* 20 27*  CREATININE 1.32* 1.23 1.24 1.34*  CALCIUM 8.9 8.7* 8.9 8.4*  GFRNONAA  --  >60 60* 54*  PROT 5.7*  --  7.0 6.0*  ALBUMIN 4.1  --  4.2 4.0  AST 20  --  25 30  ALT 18  --  23 27  ALKPHOS 91  --  86 77  BILITOT 1.2  --  1.8* 1.6*     No results found.   ASSESSMENT & PLAN:   Symptomatic anemia # PANCYTOPENIA-[2019- thrombocytopebia hemoglobin- 10; leukopenia- 2.5; no diff. platelets 100s. DEC 2024-CT- [renal]- NO cirrhosis; spleen. APRIL 2025- bone marrow- BONE MARROW, ASPIRATE, CLOT, CORE: - Hypercellular bone marrow (average 45%) with myeloid hyperplasia, erythroid  hypoplasia and megakaryocytic dyspoiesis -  Plasmacytosis (17% aspirate; greater than 30% biopsy) with aberrant   CD56/cyclin D1 positivity consistent with a plasma cell neoplasm.  The differential diagnosis for hypercellularity/dyspoiesis-concurrent MDS with plasmacytosis/multiple myeloma.  Other possibility likely reactive secondary plasmacytosis. plasmacytosis ranging from 17% (aspirate to focally 70% by CD138 IHC on the biopsy/clot section with an overall average interpreted as 30%.  In addition there is aberrant cyclin D1 expression as well as apparent CD117 expression; however, definitive clonality could not be established given poor kappa/lambda ISH staining; however, this is interpreted to most likely represent a plasma cell neoplasm . Addendum: I reviewed with the pathologist-although unable to assess for clonality  of the bone marrow plasma cells.  However given-11:14 translocation-suggestive of clonality positive.  Plasma cells in bone marrow on average about 30% [patchy].  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  FISH panel for plasma cell neoplasm: 11:14 translocation. NGS for MDS- ASXL- NF-1; STAG-2; TET-2   # Interestingly -immunofixation shows IgA monoclonal protein with lambda light chain specificity- NOT QUANTIFIABLE. K/L= Normal. Beta 2- 2.8.  X-rays skeletal survey-negative for any bone lesions. JUNE 2025- whole body PET scan-negative for any bone lesions. JULY 2025- Status post second opinion at [facility]- [Dr.Rubenstein]-discussed with Dr. Homer.  Currently awaiting review of bone marrow biopsy at [facility] discuss the next treatment options.  # Patient currently mainly symptomatic with the hemoglobin above 9.  At this time continue to hold any active therapies at this time.  Given on bone marrow- Storage Iron: Decreased/trace histiocytic iron stores. #Recommend gentle iron [iron biglycinate; 28 mg ] 1 pill a day.  This pill is unlikely to cause stomach upset or cause constipation.   #  Hypothyroidsm-on Synthroid  June 2025 PET scan incidental -there is asymmetric uptake along the right thyroid  nodule. A neoplastic lesion is possible- recommend thyroid  ultrasound for further evaluation.  # A.fib [off Eliquis ; Dr.Gollan- sec to hematuria- OCT 2024]- on amiodarone -recommend reaching out to cardiology watchman device. [Dr.Lambert]  # GU : history of prostate cancer [2011]-status post brachytherapy/hematuria-status post IR ablation improved. No concerns of recurrence.   # DISPOSITION: # thyroid  Ultrasound in appx 2 weeks-  # follow up in 6 weeks- MD; labs- cbc/cmp;iron studies; ferritin;  LDH; haptoglobin-  Dr.B  Cc; Dr.Gollan, Dr.Lambert, Ms.Cannady   All questions were answered. The patient knows to call the clinic with any problems, questions or concerns.   [name] JONELLE Joe, MD 09/30/2023 2:11 PM

## 2023-09-30 NOTE — Assessment & Plan Note (Addendum)
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 platelets 100s. DEC 2024-CT- [renal]- NO cirrhosis; spleen. 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  The differential diagnosis for hypercellularity/dyspoiesis-concurrent MDS with plasmacytosis/multiple myeloma.  Other possibility likely reactive secondary plasmacytosis. plasmacytosis ranging from 17% (aspirate to focally 70% by CD138 IHC on the biopsy/clot section with an overall average interpreted as 30%.  In addition there is aberrant cyclin D1 expression as well as apparent CD117 expression; however, definitive clonality could not be established given poor kappa/lambda ISH staining; however, this is interpreted to most likely represent a plasma cell neoplasm . Addendum: I reviewed with the pathologist-although unable to assess for clonality  of the bone marrow plasma cells.  However given-11:14 translocation-suggestive of clonality positive.  Plasma cells in bone marrow on average about 30% [patchy].  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  FISH panel for plasma cell neoplasm: 11:14 translocation. NGS for MDS- ASXL- NF-1; STAG-2; TET-2   # Interestingly -immunofixation shows IgA monoclonal protein with lambda light chain specificity- NOT QUANTIFIABLE. K/L= Normal. Beta 2- 2.8.  X-rays skeletal survey-negative for any bone lesions. JUNE 2025- whole body PET scan-negative for any bone lesions. JULY 2025- Status post second opinion at [facility]- [Dr.Rubenstein]-discussed with Dr. Homer.  Currently awaiting review of bone marrow biopsy at [facility] discuss  the next treatment options.  # Patient currently mainly symptomatic with the hemoglobin above 9.  At this time continue to hold any active therapies at this time.  Given on bone marrow- Storage Iron: Decreased/trace histiocytic iron stores. #Recommend gentle iron [iron biglycinate; 28 mg ] 1 pill a day.  This pill is unlikely to cause stomach upset or cause constipation.   # Hypothyroidsm-on Synthroid  June 2025 PET scan incidental -there is asymmetric uptake along the right thyroid  nodule. A neoplastic lesion is possible- recommend thyroid  ultrasound for further evaluation.  # A.fib [off Eliquis ; Dr.Gollan- sec to hematuria- OCT 2024]- on amiodarone -recommend reaching out to cardiology watchman device. [Dr.Lambert]  # GU : history of prostate cancer [2011]-status post brachytherapy/hematuria-status post IR ablation improved. No concerns of recurrence.   # DISPOSITION: # thyroid  Ultrasound in appx 2 weeks-  # follow up in 6 weeks- MD; labs- cbc/cmp;iron studies; ferritin;  LDH; haptoglobin-  Dr.B  Cc; Dr.Gollan, Dr.Lambert, Ms.Cannady

## 2023-10-01 DIAGNOSIS — C9000 Multiple myeloma not having achieved remission: Secondary | ICD-10-CM | POA: Diagnosis not present

## 2023-10-02 ENCOUNTER — Encounter: Payer: Self-pay | Admitting: Urology

## 2023-10-09 ENCOUNTER — Ambulatory Visit (HOSPITAL_COMMUNITY)
Admission: RE | Admit: 2023-10-09 | Discharge: 2023-10-09 | Disposition: A | Discharge status: Home / Self Care | Source: Ambulatory Visit | Attending: Internal Medicine | Admitting: Internal Medicine

## 2023-10-09 DIAGNOSIS — E041 Nontoxic single thyroid nodule: Secondary | ICD-10-CM | POA: Insufficient documentation

## 2023-10-14 ENCOUNTER — Encounter: Payer: Self-pay | Admitting: Cardiology

## 2023-10-20 ENCOUNTER — Other Ambulatory Visit: Payer: Self-pay | Admitting: Nurse Practitioner

## 2023-10-20 NOTE — Telephone Encounter (Unsigned)
 Copied from CRM #[id number]. Topic: Clinical - Medication Refill >> Oct 20, 2023  8:31 AM Gennette ORN wrote: Medication: levothyroxine  (SYNTHROID ) 100 MCG tablet  Has the patient contacted their pharmacy? Yes (Agent: If no, request that the patient contact the pharmacy for the refill. If patient does not wish to contact the pharmacy document the reason why and proceed with request.) (Agent: If yes, when and what did the pharmacy advise?)  This is the patient's preferred pharmacy:  [facility] [address]), [place] - [address] [place]) [place] [address] Phone: [phone] Fax: [phone]  Is this the correct pharmacy for this prescription? Yes If no, delete pharmacy and type the correct one.   Has the prescription been filled recently? Yes  Is the patient out of the medication? No  Has the patient been seen for an appointment in the last year OR does the patient have an upcoming appointment? Yes  Can we respond through MyChart? Yes  Agent: Please be advised that Rx refills may take up to 3 business days. We ask that you follow-up with your pharmacy.

## 2023-10-21 ENCOUNTER — Encounter: Payer: Self-pay | Admitting: Nurse Practitioner

## 2023-10-21 ENCOUNTER — Ambulatory Visit (INDEPENDENT_AMBULATORY_CARE_PROVIDER_SITE_OTHER): Payer: PPO | Admitting: Nurse Practitioner

## 2023-10-21 VITALS — BP 136/70 | HR 64 | Temp 98.7°F | Ht 73.0 in | Wt 231.4 lb

## 2023-10-21 DIAGNOSIS — N1831 Chronic kidney disease, stage 3a: Secondary | ICD-10-CM | POA: Diagnosis not present

## 2023-10-21 DIAGNOSIS — I10 Essential (primary) hypertension: Secondary | ICD-10-CM

## 2023-10-21 DIAGNOSIS — E039 Hypothyroidism, unspecified: Secondary | ICD-10-CM

## 2023-10-21 DIAGNOSIS — I4891 Unspecified atrial fibrillation: Secondary | ICD-10-CM | POA: Diagnosis not present

## 2023-10-21 DIAGNOSIS — I700 Atherosclerosis of aorta: Secondary | ICD-10-CM | POA: Diagnosis not present

## 2023-10-21 DIAGNOSIS — G4733 Obstructive sleep apnea (adult) (pediatric): Secondary | ICD-10-CM

## 2023-10-21 DIAGNOSIS — D6869 Other thrombophilia: Secondary | ICD-10-CM | POA: Diagnosis not present

## 2023-10-21 DIAGNOSIS — E785 Hyperlipidemia, unspecified: Secondary | ICD-10-CM

## 2023-10-21 DIAGNOSIS — D46Z Other myelodysplastic syndromes: Secondary | ICD-10-CM | POA: Insufficient documentation

## 2023-10-21 MED ORDER — LEVOTHYROXINE SODIUM 100 MCG PO TABS: 100.0000 ug | ORAL_TABLET | Freq: Every day | ORAL | 3 refills | Status: AC

## 2023-10-21 NOTE — Assessment & Plan Note (Signed)
 Ongoing.  No current statin, discussed at length current levels and ASCVD 36.1%.  He wishes to further discuss with cardiology.  If statin initiated would need to be low dose and assess closely as takes Amiodarone.  Lipid check today.

## 2023-10-21 NOTE — Assessment & Plan Note (Signed)
 Chronic, ongoing.  Continue current medication regimen and adjust as needed. Thyroid labs today, recent TSH elevated.  Monitor closely as is also taking Amiodarone.

## 2023-10-21 NOTE — Assessment & Plan Note (Signed)
 Diagnosed by oncology per patient.  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 Recent notes reviewed and labs.

## 2023-10-21 NOTE — Patient Instructions (Addendum)
Be Involved in Caring For Your Health:  Taking Medications When medications are taken as directed, they can greatly improve your health. But if they are not taken as prescribed, they may not work. In some cases, not taking them correctly can be harmful. To help ensure your treatment remains effective and safe, understand your medications and how to take them. Bring your medications to each visit for review by your provider.  Your lab results, notes, and after visit summary will be available on My Chart. We strongly encourage you to use this feature. If lab results are abnormal the clinic will contact you with the appropriate steps. If the clinic does not contact you assume the results are satisfactory. You can always view your results on My Chart. If you have questions regarding your health or results, please contact the clinic during office hours. You can also ask questions on My Chart.  We at [facility] are grateful that you chose [place] to provide your care. We strive to provide evidence-based and compassionate care and are always looking for feedback. If you get a survey from the clinic please complete this so we can hear your opinions.  Atrial Fibrillation Atrial fibrillation (AFib) is a type of heartbeat that is irregular or fast. If you have AFib, your heart beats without any order. This makes it hard for your heart to pump blood in a normal way. AFib may come and go, or it may become a long-lasting problem. If AFib is not treated, it can put you at higher risk for stroke, heart failure, and other heart problems. What are the causes? AFib may be caused by diseases that damage the heart's electrical system. They include: High blood pressure. Heart failure. Heart valve diseases. Heart surgery. Diabetes. Thyroid disease. Kidney disease. Lung diseases, such as pneumonia or COPD. Sleep apnea. Sometimes the cause is not known. What increases the risk? You are more likely to  develop AFib if: You are older. You exercise often and very hard. You have a family history of AFib. You are male. You are Caucasian. You are overweight. You smoke. You drink a lot of alcohol. What are the signs or symptoms? Common symptoms of this condition include: A feeling that your heart is beating very fast. Chest pain or discomfort. Feeling short of breath. Suddenly feeling light-headed or weak. Getting tired easily during activity. Fainting. Sweating. In some cases, there are no symptoms. How is this treated? Medicines to: Prevent blood clots. Treat heart rate or heart rhythm problems. Using devices, such as a pacemaker, to correct heart rhythm problems. Doing surgery to remove the part of the heart that sends bad signals. Closing an area where clots can form in the heart (left atrial appendage). In some cases, your doctor will treat other underlying conditions. Follow these instructions at home: Medicines Take over-the-counter and prescription medicines only as told by your doctor. Do not take any new medicines without first talking to your doctor. If you are taking blood thinners: Talk with your doctor before taking aspirin or NSAIDs, such as ibuprofen. Take your medicines as told. Take them at the same time each day. Do not do things that could hurt or bruise you. Be careful to avoid falls. Wear an alert bracelet or carry a card that says you take blood thinners. Lifestyle Do not smoke or use any products that contain nicotine or tobacco. If you need help quitting, ask your doctor. Eat heart-healthy foods. Talk with your doctor about the right eating plan  for you. Exercise regularly as told by your doctor. Do not drink alcohol. Lose weight if you are overweight. General instructions If you have sleep apnea, treat it as told by your doctor. Do not use diet pills unless your doctor says they are safe for you. Diet pills may make heart problems worse. Keep all  follow-up visits. Your doctor will check your heart rate and rhythm regularly. Contact a doctor if: You notice a change in the speed, rhythm, or strength of your heartbeat. You are taking a blood-thinning medicine and you get more bruising. You get tired more easily when you move or exercise. You have a sudden change in weight. Get help right away if:  You have pain in your chest. You have trouble breathing. You have side effects of blood thinners, such as blood in your vomit, poop (stool), or pee (urine), or bleeding that cannot stop. You have any signs of a stroke. "BE FAST" is an easy way to remember the main warning signs: B - Balance. Dizziness, sudden trouble walking, or loss of balance. E - Eyes. Trouble seeing or a change in how you see. F - Face. Sudden weakness or loss of feeling in the face. The face or eyelid may droop on one side. A - Arms.Weakness or loss of feeling in an arm. This happens suddenly and usually on one side of the body. S - Speech. Sudden trouble speaking, slurred speech, or trouble understanding what people say. T - Time.Time to call emergency services. Write down what time symptoms started. You have other signs of a stroke, such as: A sudden, very bad headache with no known cause. Feeling like you may vomit (nausea). Vomiting. A seizure. These symptoms may be an emergency. Get help right away. Call 911. Do not wait to see if the symptoms will go away. Do not drive yourself to the hospital. This information is not intended to replace advice given to you by your health care provider. Make sure you discuss any questions you have with your health care provider. Document Revised: 11/21/2021 Document Reviewed: 11/21/2021 Elsevier Patient Education  2024 [employer].

## 2023-10-21 NOTE — Assessment & Plan Note (Signed)
Chronic, noted on past labs on and off since 2019 -- recent improved.  Monitor closely and recommend adequate hydration at home + avoid NSAIDs.  CMP today.  Refer to nephrology as needed.  Continue Olmesartan for BP, which may also offer benefit to kidneys.

## 2023-10-21 NOTE — Assessment & Plan Note (Signed)
 Ongoing.  Seen on CT scan 11/02/20 -- have recommended statin initiation for prevention and reviewed imaging and ASCVD scoring with him.  He wishes to think about this.

## 2023-10-21 NOTE — Assessment & Plan Note (Signed)
 Chronic, ongoing.  Continue Olmesartan  40 MG daily for BP and kidney protection with CKD and Coreg , and further adjust regimen as needed in future.  Educated him on this medication use and side effects to monitor for.  Recommend checking BP three mornings a week and documenting + focus on DASH diet.  Continue collaboration with cardiology.  LABS: up to date.

## 2023-10-21 NOTE — Assessment & Plan Note (Signed)
 On Eliquis  and has A-Fib.  Continue to monitor for any bleeding or wounds.  Will continue to collaborate with oncology.

## 2023-10-21 NOTE — Assessment & Plan Note (Signed)
 Chronic, stable with rate controlled.  Continue current medication regimen and collaboration with caridology.  Possible Watchmen and ablation in future.

## 2023-10-21 NOTE — Telephone Encounter (Signed)
 Requested by interface surescripts. Duplicate request. Receipt confirmed by pharmacy 10/21/23 at 11:22 am .  Requested Prescriptions  Refused Prescriptions Disp Refills   levothyroxine  (SYNTHROID ) 100 MCG tablet 60 tablet 3    Sig: Take 1 tablet (100 mcg total) by mouth daily.     Endocrinology:  Hypothyroid Agents Failed - 10/21/2023 11:57 AM      Failed - TSH in normal range and within 360 days    TSH  Date Value Ref Range Status  04/22/2023 5.040 (H) 0.450 - 4.500 uIU/mL Final         Passed - Valid encounter within last 12 months    Recent Outpatient Visits           Today Atrial fibrillation with RVR (HCC)   [facility] [place], Melanie T, NP   6 months ago Stage 3a chronic kidney disease (HCC)   [facility] Valer[place] Melanie DASEN, NP       Future Appointments             In 2 months Francisca, Redell BROCKS, MD [facility] [place]

## 2023-10-21 NOTE — Progress Notes (Signed)
 BP 136/70 (BP Location: Left Arm)   Pulse 64   Temp 98.7 F (37.1 C) (Oral)   Ht 6' 1 (1.854 m)   Wt 231 lb 6.4 oz (105 kg)   SpO2 97%   BMI 30.53 kg/m    Subjective:    Patient ID: [name] [name], male    DOB: [birth year], 78 y.o.   MRN: [id number]  HPI: [name] [name] is a 78 y.o. male  Chief Complaint  Patient presents with   Atrial Fibrillation   Hyperlipidemia   Hypertension   Hypothyroidism   Chronic Kidney Disease   HYPERTENSION / HYPERLIPIDEMIA Taking Pacerone , Olmesartan , and Coreg .  No current statin. Satisfied with current treatment? yes Duration of hypertension: chronic BP monitoring frequency: a few times a week BP range: 127/69 to 146/79 -- HR 50-60 range BP medication side effects: no Duration of hyperlipidemia: years Cholesterol medication side effects: not taking Cholesterol supplements: none Medication compliance: excellent compliance Aspirin : no Recent stressors: no Recurrent headaches: no Visual changes: no Palpitations: no Dyspnea: no Chest pain: no Lower extremity edema: no Dizzy/lightheaded: no  The 10-year ASCVD risk score (Arnett DK, et al., 2019) is: 35.3%   Values used to calculate the score:     Age: 8 years     Clincally relevant sex: Male     Is Non-Hispanic African American: No     Diabetic: No     Tobacco smoker: No     Systolic Blood Pressure: 136 mmHg     Is BP treated: Yes     HDL Cholesterol: 43 mg/dL     Total Cholesterol: 141 mg/dL  ATRIAL FIBRILLATION Follows with cardiology with last visit on 04/02/23 where they discussed possible Watchmen and ablation.  These are on hold at present. Atrial fibrillation status: stable Satisfied with current treatment: yes  Medication side effects:  no Medication compliance: poor compliance Etiology of atrial fibrillation:  Palpitations:  no Chest pain:  no Dyspnea on exertion:  no Orthopnea:  no Syncope:  no Edema:  no Ventricular rate control:  B-blocker Anti-coagulation: long acting    CHRONIC KIDNEY DISEASE (3a) Follows with oncology for anemia + MDS and multiple myeloma (reported by patient), last visit 09/30/23. CKD status: stable Medications renally dose: yes Previous renal evaluation: yes Pneumovax:  Up to Date Influenza Vaccine:  Up to Date   HYPOTHYROIDISM Takes Levothyroxine  75 MCG daily. Thyroid  control status:stable Satisfied with current treatment? yes Medication side effects: no Medication compliance: excellent compliance Etiology of hypothyroidism:  Recent dose adjustment:yes Fatigue: no Cold intolerance: no Heat intolerance: no Weight gain: no Weight loss: no Constipation: no Diarrhea/loose stools: no Palpitations: no Lower extremity edema: no Anxiety/depressed mood: no   Relevant past medical, surgical, family and social history reviewed and updated as indicated. Interim medical history since our last visit reviewed. Allergies and medications reviewed and updated.  Review of Systems  Constitutional:  Negative for activity change, diaphoresis, fatigue and fever.  Respiratory:  Negative for cough, chest tightness, shortness of breath and wheezing.   Cardiovascular:  Negative for chest pain, palpitations and leg swelling.  Gastrointestinal: Negative.   Endocrine: Negative.   Genitourinary:  Negative for dysuria, frequency, hematuria and urgency.  Neurological: Negative.   Psychiatric/Behavioral: Negative.      Per HPI unless specifically indicated above     Objective:    BP 136/70 (BP Location: Left Arm)   Pulse 64   Temp 98.7 F (37.1 C) (Oral)   Ht 6' 1 (1.854 m)  Wt 231 lb 6.4 oz (105 kg)   SpO2 97%   BMI 30.53 kg/m   Wt Readings from Last 3 Encounters:  10/21/23 231 lb 6.4 oz (105 kg)  09/30/23 232 lb 3.2 oz (105.3 kg)  08/19/23 232 lb 9.6 oz (105.5 kg)    Physical Exam Vitals and nursing note reviewed.  Constitutional:      General: He is awake. He is not in acute  distress.    Appearance: He is well-developed and well-groomed. He is obese. He is not ill-appearing.  HENT:     Head: Normocephalic and atraumatic.     Right Ear: Hearing normal. No drainage.     Left Ear: Hearing normal. No drainage.  Eyes:     General: Lids are normal.        Right eye: No discharge.        Left eye: No discharge.     Conjunctiva/sclera: Conjunctivae normal.     Pupils: Pupils are equal, round, and reactive to light.  Neck:     Thyroid : No thyromegaly.     Vascular: No carotid bruit.     Trachea: Trachea normal.  Cardiovascular:     Rate and Rhythm: Normal rate and regular rhythm.     Heart sounds: Normal heart sounds, S1 normal and S2 normal. No murmur heard.    No gallop.  Pulmonary:     Effort: Pulmonary effort is normal. No accessory muscle usage or respiratory distress.     Breath sounds: Normal breath sounds.  Abdominal:     General: Bowel sounds are normal. There is no distension.     Palpations: Abdomen is soft.     Tenderness: There is no abdominal tenderness.  Musculoskeletal:     Cervical back: Normal range of motion and neck supple.     Right lower leg: No edema.     Left lower leg: No edema.  Skin:    General: Skin is warm and dry.     Capillary Refill: Capillary refill takes less than 2 seconds.  Neurological:     Mental Status: He is alert and oriented to person, place, and time.     Deep Tendon Reflexes: Reflexes are normal and symmetric.  Psychiatric:        Attention and Perception: Attention normal.        Mood and Affect: Mood normal.        Speech: Speech normal.        Behavior: Behavior normal. Behavior is cooperative.        Thought Content: Thought content normal.    Results for orders placed or performed during the hospital encounter of 08/25/23  Glucose, capillary   Collection Time: 08/25/23  9:59 AM  Result Value Ref Range   Glucose-Capillary 122 (H) 70 - 99 mg/dL      Assessment & Plan:   Problem List Items  Addressed This Visit       Cardiovascular and Mediastinum   Essential hypertension (Chronic)   Chronic, ongoing.  Continue Olmesartan  40 MG daily for BP and kidney protection with CKD and Coreg , and further adjust regimen as needed in future.  Educated him on this medication use and side effects to monitor for.  Recommend checking BP three mornings a week and documenting + focus on DASH diet.  Continue collaboration with cardiology.  LABS: up to date.      Atrial fibrillation with RVR (HCC) - Primary   Chronic, stable with rate controlled.  Continue current  medication regimen and collaboration with caridology.  Possible Watchmen and ablation in future.      Aortic atherosclerosis (HCC)   Ongoing.  Seen on CT scan 11/02/20 -- have recommended statin initiation for prevention and reviewed imaging and ASCVD scoring with him.  He wishes to think about this.        Endocrine   Hypothyroidism (Chronic)   Chronic, ongoing.  Continue current medication regimen and adjust as needed. Thyroid  labs today, recent TSH elevated.  Monitor closely as is also taking Amiodarone .      Relevant Medications   levothyroxine  (SYNTHROID ) 100 MCG tablet   Other Relevant Orders   T4, free   TSH     Genitourinary   CKD (chronic kidney disease), stage III (HCC)   Chronic, noted on past labs on and off since 2019 -- recent improved.  Monitor closely and recommend adequate hydration at home + avoid NSAIDs.  CMP today.  Refer to nephrology as needed.  Continue Olmesartan  for BP, which may also offer benefit to kidneys.        Hematopoietic and Hemostatic   Other thrombophilia (HCC)   On Eliquis  and has A-Fib.  Continue to monitor for any bleeding or wounds.  Will continue to collaborate with oncology.        Other   MDS (myelodysplastic syndrome), low grade (HCC)   Diagnosed by oncology per patient.  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 Recent notes reviewed and labs.       Hyperlipidemia LDL goal <70   Ongoing.  No current statin, discussed at length current levels and ASCVD 36.1%.  He wishes to further discuss with cardiology.  If statin initiated would need to be low dose and assess closely as takes Amiodarone .  Lipid check today.      Relevant Orders   Lipid Panel w/o Chol/HDL Ratio     Follow up plan: Return in about 6 months (around 04/22/2024) for Annual Physical after 04/21/24.

## 2023-10-22 ENCOUNTER — Ambulatory Visit: Payer: Self-pay | Admitting: Nurse Practitioner

## 2023-10-22 LAB — LIPID PANEL W/O CHOL/HDL RATIO
Cholesterol, Total: 115 mg/dL (ref 100–199)
HDL: 37 mg/dL — ABNORMAL LOW (ref >39)
LDL Chol Calc (NIH): 66 mg/dL (ref 0–99)
Triglycerides: 55 mg/dL (ref 0–149)
VLDL Cholesterol Cal: 12 mg/dL (ref 5–40)

## 2023-10-22 LAB — TSH: TSH: 4.18 u[IU]/mL (ref 0.450–4.500)

## 2023-10-22 LAB — T4, FREE: Free T4: 1.89 ng/dL — ABNORMAL HIGH (ref 0.82–1.77)

## 2023-10-22 NOTE — Progress Notes (Signed)
 Contacted via MyChart  Good afternoon [name] [name], your labs have returned.  Lipid panel is stable. TSH is normal, Free T4 remains a little elevated. This is baseline for you and I suspect related to your Amiodarone  as it can elevate T4 levels. We will continue to monitor and check T3 next visit too, as suspect this will be low.  Any questions? No medication changes at this time. Keep being amazing!!  Thank you for allowing me to participate in your care.  I appreciate you. Kindest regards, Trinton Prewitt

## 2023-10-30 DIAGNOSIS — Z79890 Hormone replacement therapy (postmenopausal): Secondary | ICD-10-CM | POA: Diagnosis not present

## 2023-10-30 DIAGNOSIS — I10 Essential (primary) hypertension: Secondary | ICD-10-CM | POA: Diagnosis not present

## 2023-10-30 DIAGNOSIS — D469 Myelodysplastic syndrome, unspecified: Secondary | ICD-10-CM | POA: Diagnosis not present

## 2023-10-30 DIAGNOSIS — C9000 Multiple myeloma not having achieved remission: Secondary | ICD-10-CM | POA: Diagnosis not present

## 2023-11-11 ENCOUNTER — Inpatient Hospital Stay: Attending: Internal Medicine

## 2023-11-11 ENCOUNTER — Inpatient Hospital Stay (HOSPITAL_BASED_OUTPATIENT_CLINIC_OR_DEPARTMENT_OTHER): Admitting: Internal Medicine

## 2023-11-11 ENCOUNTER — Encounter: Payer: Self-pay | Admitting: Internal Medicine

## 2023-11-11 VITALS — BP 166/81 | HR 58 | Temp 96.8°F | Resp 12 | Ht 73.0 in | Wt 225.0 lb

## 2023-11-11 DIAGNOSIS — D4620 Refractory anemia with excess of blasts, unspecified: Secondary | ICD-10-CM | POA: Insufficient documentation

## 2023-11-11 DIAGNOSIS — E041 Nontoxic single thyroid nodule: Secondary | ICD-10-CM

## 2023-11-11 DIAGNOSIS — D61818 Other pancytopenia: Secondary | ICD-10-CM | POA: Insufficient documentation

## 2023-11-11 DIAGNOSIS — I4819 Other persistent atrial fibrillation: Secondary | ICD-10-CM | POA: Insufficient documentation

## 2023-11-11 DIAGNOSIS — E039 Hypothyroidism, unspecified: Secondary | ICD-10-CM | POA: Insufficient documentation

## 2023-11-11 DIAGNOSIS — Z8546 Personal history of malignant neoplasm of prostate: Secondary | ICD-10-CM | POA: Insufficient documentation

## 2023-11-11 DIAGNOSIS — D649 Anemia, unspecified: Secondary | ICD-10-CM

## 2023-11-11 DIAGNOSIS — D46Z Other myelodysplastic syndromes: Secondary | ICD-10-CM

## 2023-11-11 LAB — CBC WITH DIFFERENTIAL (CANCER CENTER ONLY)
Basophils Absolute: 0 K/uL (ref 0.0–0.1)
Basophils Relative: 0 %
Eosinophils Absolute: 0 K/uL (ref 0.0–0.5)
Eosinophils Relative: 1 %
HCT: 29.8 % — ABNORMAL LOW (ref 39.0–52.0)
Hemoglobin: 8.7 g/dL — ABNORMAL LOW (ref 13.0–17.0)
Lymphocytes Relative: 61 %
Lymphs Abs: 1.7 K/uL (ref 0.7–4.0)
MCH: 27.2 pg (ref 26.0–34.0)
MCHC: 29.2 g/dL — ABNORMAL LOW (ref 30.0–36.0)
MCV: 93.1 fL (ref 80.0–100.0)
Monocytes Absolute: 0 K/uL — ABNORMAL LOW (ref 0.1–1.0)
Monocytes Relative: 1 %
Neutro Abs: 1 K/uL — ABNORMAL LOW (ref 1.7–7.7)
Neutrophils Relative %: 37 %
Platelet Count: 167 K/uL (ref 150–400)
RBC: 3.2 MIL/uL — ABNORMAL LOW (ref 4.22–5.81)
RDW: 19.5 % — ABNORMAL HIGH (ref 11.5–15.5)
WBC Count: 2.8 K/uL — ABNORMAL LOW (ref 4.0–10.5)
nRBC: 0 % (ref 0.0–0.2)

## 2023-11-11 LAB — LACTATE DEHYDROGENASE: LDH: 218 U/L — ABNORMAL HIGH (ref 98–192)

## 2023-11-11 LAB — CMP (CANCER CENTER ONLY)
ALT: 20 U/L (ref 0–44)
AST: 27 U/L (ref 15–41)
Albumin: 3.8 g/dL (ref 3.5–5.0)
Alkaline Phosphatase: 68 U/L (ref 38–126)
Anion gap: 6 (ref 5–15)
BUN: 26 mg/dL — ABNORMAL HIGH (ref 8–23)
CO2: 25 mmol/L (ref 22–32)
Calcium: 8.9 mg/dL (ref 8.9–10.3)
Chloride: 110 mmol/L (ref 98–111)
Creatinine: 1.21 mg/dL (ref 0.61–1.24)
GFR, Estimated: >60 mL/min (ref >60)
Glucose, Bld: 115 mg/dL — ABNORMAL HIGH (ref 70–99)
Potassium: 4.1 mmol/L (ref 3.5–5.1)
Sodium: 141 mmol/L (ref 135–145)
Total Bilirubin: 1.6 mg/dL — ABNORMAL HIGH (ref 0.0–1.2)
Total Protein: 6.1 g/dL — ABNORMAL LOW (ref 6.5–8.1)

## 2023-11-11 LAB — IRON AND TIBC
Iron: 152 ug/dL (ref 45–182)
Saturation Ratios: 37 % (ref 17.9–39.5)
TIBC: 406 ug/dL (ref 250–450)
UIBC: 254 ug/dL

## 2023-11-11 LAB — FERRITIN: Ferritin: 66 ng/mL (ref 24–336)

## 2023-11-11 NOTE — Progress Notes (Signed)
 Fatigue/weakness: NO Dyspena: NO Light headedness: YES MILD Blood in stool: NO  10/09/23 u/s thyroid .

## 2023-11-11 NOTE — Assessment & Plan Note (Addendum)
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   # S/p evaluation with Dr. Homer and also Dr. Zeidner at [facility].  # Given the slow trend in the hemoglobin recommend Aranesp. Discussed use of erythropoietin  stimulating agents to stimulate the bone marrow.  Discussed the potential issues with erythropoietin  estimating agents-given the risk of stroke thromboembolic events/elevated blood pressure.   # I discussed that I would recommend using iron infusion/Venofer; along with Retacrit to maintain hemoglobin around 10- 11. I also discussed increased risk of thromboembolic events in the target hemoglobin is around 12 to 13.        # Hypothyroidsm-on Synthroid  June 2025 PET scan incidental -there is asymmetric uptake along the right thyroid  nodule. A neoplastic lesion is possible- recommend thyroid  ultrasound for further evaluation. US - Thyroid - AUG Positive for a 2.5 cm nodule within the right mid thyroid  gland corresponding with the focus of hypermetabolic activity on prior PET-CT.  Recommend biopsy at next visit.   # A.fib [off Eliquis ; Dr.Gollan- sec to hematuria- OCT 2024]- on amiodarone -Ok from hematology stand point to proceed with watchman device. [Dr.Lambert]  # GU : history of prostate cancer [2011]-status post brachytherapy/hematuria-status post IR ablation improved. No concerns of recurrence.   # DISPOSITION: # Aranesp next week # follow up in 4 weeks- MD; labs- cbc/bmp--- possible arapesp  Dr.B  Cc; Dr.Gollan, Dr.Lambert, Ms.Cannady

## 2023-11-11 NOTE — Progress Notes (Signed)
 [facility] CONSULT NOTE  Patient Care Team: Valerio Melanie DASEN, NP as PCP - General (Nurse Practitioner) Cindie Ole DASEN, MD as PCP - Electrophysiology (Cardiology) Perla Evalene PARAS, MD as Consulting Physician (Cardiology) Dasher, Alm LABOR, MD as Consulting Physician (Dermatology) Elma Zachary RAMAN [facility]) Rennie Cindy SAUNDERS, MD as Consulting Physician (Oncology)  CHIEF COMPLAINTS/PURPOSE OF CONSULTATION: Pancytopenia  Oncology History Overview Note  # PANCYTOPENIA-[2019- thrombocytopebia hemoglobin- 10; leukopenia- 2.5; no diff. platelets 100s. DEC 2024-CT- [renal]- NO cirrhosis; spleen.   APRIL 2025- bone marrow-  # BONE MARROW, ASPIRATE, CLOT, CORE: - Hypercellular bone marrow (average 45%) with myeloid hyperplasia, erythroid hypoplasia and megakaryocytic dyspoiesis -  Plasmacytosis (17% aspirate; greater than 30% biopsy) with aberrant  CD56/cyclin D1 positivity consistent with a plasma cell neoplasm.  The differential diagnosis for hypercellularity/dyspoiesis-concurrent MDS with plasmacytosis/multiple myeloma.  Other possibility likely reactive secondary plasmacytosis. plasmacytosis ranging from 17% (aspirate to focally 70% by CD138 IHC on the biopsy/clot section with an overall average interpreted as 30%.  In addition there is aberrant cyclin D1 expression as well as apparent CD117 expression; however, definitive clonality could not be established given poor kappa/lambda ISH staining; however, this is interpreted to most likely represent a plasma cell neoplasm . Addendum: I reviewed with the pathologist-although unable to assess for clonality  of the bone marrow plasma cells.  However given-11:14 translocation-suggestive of clonality positive.  Plasma cells in bone marrow on average about 30% [patchy].  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  FISH panel for  plasma cell neoplasm: 11:14 translocation. 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 Accompanied by family/wife.   Adriana LITTIE Kluver 78 y.o.  male pleasant patient history ongoing anemia-sec to multiple myeloma Vs MDS-   Afib- currently off Eliquis -ongoing anemia/history of hematuria is here for follow-up.  Patient status post evaluation with Dr. Carolan and Dr. Homer at [facility].  Patient continues to complain of mild-moderate fatigue.  Otherwise denies any fevers or chills.  No nausea no vomiting.   Review of Systems  Constitutional:  Positive for malaise/fatigue. Negative for chills, diaphoresis, fever and weight loss.  HENT:  Negative for nosebleeds and sore throat.   Eyes:  Negative for double vision.  Respiratory:  Negative for cough, hemoptysis, sputum production, shortness of breath and wheezing.   Cardiovascular:  Negative for chest pain, palpitations, orthopnea and leg swelling.  Gastrointestinal:  Negative for abdominal pain, blood in stool, constipation, diarrhea, heartburn, melena, nausea and vomiting.  Genitourinary:  Negative for dysuria, frequency and urgency.  Musculoskeletal:  Negative for back pain and joint pain.  Skin: Negative.  Negative for itching and rash.  Neurological:  Negative for dizziness, tingling, focal weakness, weakness and headaches.  Endo/Heme/Allergies:  Does not bruise/bleed easily.  Psychiatric/Behavioral:  Negative for depression. The patient is not nervous/anxious and does not have insomnia.      MEDICAL HISTORY:  Past Medical History:  Diagnosis Date   A-fib [facility])    Atrial fibrillation with RVR (HCC)    Dupuytren's contracture     Essential hypertension 09/05/2014   Hypertension    Hypokalemia    Hypothyroidism    Malignant neoplasm of  prostate (HCC) 09/05/2014   prostate,skin of the scalp   Mitral regurgitation    a. echo 03/2015: echo 03/2015: EF 50-55%, no RWMA, no sufficienct to allow for LV diastolic fxn, mild MR, LA mildly dilated at 46 mm, RV systolic fxn nl, PASP nl   Nephrolithiasis    New onset atrial fibrillation (HCC) 04/18/2015   OSA (obstructive sleep apnea) 05/11/2015   Persistent atrial fibrillation (HCC)    Skin cancer    Sleep apnea     SURGICAL HISTORY: Past Surgical History:  Procedure Laterality Date   COLONOSCOPY WITH PROPOFOL  N/A 04/18/2015   Procedure: COLONOSCOPY WITH PROPOFOL ;  Surgeon: Rogelia Copping, MD;  Location: [facility] ENDOSCOPY;  Service: Endoscopy;  Laterality: N/A;   ELECTROPHYSIOLOGIC STUDY N/A 05/05/2015   Procedure: CARDIOVERSION;  Surgeon: Deatrice DELENA Cage, MD;  Location: [facility] ORS;  Service: Cardiovascular;  Laterality: N/A;   IR BONE MARROW BIOPSY & ASPIRATION  07/08/2023   IR EMBO TUMOR ORGAN ISCHEMIA INFARCT INC GUIDE ROADMAPPING  05/06/2023   IR RADIOLOGIST EVAL & MGMT  02/18/2023   IR RADIOLOGIST EVAL & MGMT  04/17/2023   IR RADIOLOGIST EVAL & MGMT  05/27/2023   RADIOACTIVE SEED IMPLANT N/A     SOCIAL HISTORY: Social History   Socioeconomic History   Marital status: Married    Spouse name: [name]   Number of children: 2   Years of education: Not on file   Highest education level: Bachelor's degree (e.g., BA, AB, BS)  Occupational History   Occupation: retired     Comment: Acupuncturist  Tobacco Use   Smoking status: Former    Current packs/day: 0.00    Average packs/day: 1 pack/day for 30.5 years (30.5 ttl pk-yrs)    Types: Cigarettes    Start date: 1966    Quit date: 09/05/1994    Years since quitting: 29.2   Smokeless tobacco: Former    Types: Chew    Quit date: 09/04/2004  Vaping Use   Vaping status: Never Used  Substance and Sexual Activity    Alcohol use: No   Drug use: No   Sexual activity: Not Currently  Other Topics Concern   Not on file  Social History Narrative   Plays racket ball twice a week, works outside. Indoor pets, dog and cat.    Social Drivers of Corporate investment banker Strain: Low Risk  (10/20/2023)   Overall Financial Resource Strain (CARDIA)    Difficulty of Paying Living Expenses: Not hard at all  Food Insecurity: No Food Insecurity (10/20/2023)   Hunger Vital Sign    Worried About Running Out of Food in the Last Year: Never true    Ran Out of Food in the Last Year: Never true  Transportation Needs: No Transportation Needs (10/20/2023)   PRAPARE - Administrator, Civil Service (Medical): No    Lack of Transportation (Non-Medical): No  Physical Activity: Sufficiently Active (10/20/2023)   Exercise Vital Sign    Days of Exercise per Week: 2 days    Minutes of Exercise per Session: 100 min  Stress: No Stress Concern Present (10/20/2023)   [employer] of Occupational Health - Occupational Stress Questionnaire    Feeling of Stress: Not at all  Social Connections: Moderately Integrated (10/20/2023)   Social Connection and Isolation Panel    Frequency of Communication with Friends and Family: Once a week    Frequency of Social Gatherings with Friends and Family: Once a week    Attends Religious Services:  More than 4 times per year    Active Member of Clubs or Organizations: Yes    Attends Banker Meetings: More than 4 times per year    Marital Status: Married  Catering manager Violence: Not At Risk (11/19/2022)   Humiliation, Afraid, Rape, and Kick questionnaire    Fear of Current or Ex-Partner: No    Emotionally Abused: No    Physically Abused: No    Sexually Abused: No    FAMILY HISTORY: Family History  Problem Relation Age of Onset   Heart attack Mother    Heart disease Father    Colon cancer Maternal Aunt    Arthritis Maternal Grandmother     ALLERGIES:  has no known  allergies.  MEDICATIONS:  Current Outpatient Medications  Medication Sig Dispense Refill   amiodarone  (PACERONE ) 200 MG tablet Take 1 tablet by mouth once daily 90 tablet 2   carvedilol  (COREG ) 25 MG tablet Take 1 tablet (25 mg total) by mouth 2 (two) times daily with a meal. 180 tablet 3   Cholecalciferol (VITAMIN D-3) 125 MCG (5000 UT) TABS Take 5,000 Units by mouth daily.     finasteride  (PROSCAR ) 5 MG tablet Take 1 tablet (5 mg total) by mouth daily. 90 tablet 4   folic acid  (FOLVITE ) 400 MCG tablet Take 400 mcg by mouth daily.     levothyroxine  (SYNTHROID ) 100 MCG tablet Take 1 tablet (100 mcg total) by mouth daily. 60 tablet 3   Multiple Vitamin (MULTIVITAMIN) tablet Take 1 tablet by mouth daily.     olmesartan  (BENICAR ) 40 MG tablet Take 1 tablet (40 mg total) by mouth daily. 90 tablet 4   Zinc 50 MG TABS Take 50 mg by mouth daily.     No current facility-administered medications for this visit.     PHYSICAL EXAMINATION:   Vitals:   11/11/23 0946 11/11/23 0959  BP: (!) 156/65 (!) 166/81  Pulse: (!) 58   Resp: 12   Temp: (!) 96.8 F (36 C)   SpO2: 100%     Filed Weights   11/11/23 0946  Weight: 225 lb (102.1 kg)     Physical Exam Vitals and nursing note reviewed.  HENT:     Head: Normocephalic and atraumatic.     Mouth/Throat:     Pharynx: Oropharynx is clear.  Eyes:     Extraocular Movements: Extraocular movements intact.     Pupils: Pupils are equal, round, and reactive to light.  Cardiovascular:     Rate and Rhythm: Normal rate and regular rhythm.  Pulmonary:     Comments: Decreased breath sounds bilaterally.  Abdominal:     Palpations: Abdomen is soft.  Musculoskeletal:        General: Normal range of motion.     Cervical back: Normal range of motion.  Skin:    General: Skin is warm.  Neurological:     General: No focal deficit present.     Mental Status: He is alert and oriented to person, place, and time.  Psychiatric:        Behavior: Behavior  normal.        Judgment: Judgment normal.      LABORATORY DATA:  I have reviewed the data as listed Lab Results  Component Value Date   WBC 2.8 (L) 11/11/2023   HGB 8.7 (L) 11/11/2023   HCT 29.8 (L) 11/11/2023   MCV 93.1 11/11/2023   PLT 167 11/11/2023   Recent Labs    07/02/23 1215 08/19/23  1500 11/11/23 0944  NA 139 140 141  K 4.0 4.1 4.1  CL 107 110 110  CO2 25 23 25   GLUCOSE 123* 103* 115*  BUN 20 27* 26*  CREATININE 1.24 1.34* 1.21  CALCIUM 8.9 8.4* 8.9  GFRNONAA 60* 54* >60  PROT 7.0 6.0* 6.1*  ALBUMIN 4.2 4.0 3.8  AST 25 30 27   ALT 23 27 20   ALKPHOS 86 77 68  BILITOT 1.8* 1.6* 1.6*     No results found.   ASSESSMENT & PLAN:   MDS (myelodysplastic syndrome), low grade (HCC) # Anemia [history of pancytopenia]-APRIL 2025- # BM Bx revealed MDS with <5% blasts, normal karyotype and mutations in ASXL1, SRSF2, STAG2, TET2 and NF1. 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   # S/p evaluation with Dr. Homer and also Dr. Zeidner at [facility].  # Given the slow trend in the hemoglobin recommend Aranesp. Discussed use of erythropoietin  stimulating agents to stimulate the bone marrow.  Discussed the potential issues with erythropoietin  estimating agents-given the risk of stroke thromboembolic events/elevated blood pressure.   # I discussed that I would recommend using iron infusion/Venofer; along with Retacrit to maintain hemoglobin around 10- 11. I also discussed increased risk of thromboembolic events in the target hemoglobin is around 12 to 13.        # Hypothyroidsm-on Synthroid  June 2025 PET scan incidental -there is asymmetric uptake along the right thyroid  nodule. A neoplastic lesion is possible- recommend thyroid  ultrasound for further evaluation. US - Thyroid - AUG Positive for a 2.5 cm nodule within the right mid thyroid  gland corresponding with the focus of hypermetabolic activity on prior PET-CT.  Recommend biopsy at next visit.   # A.fib [off Eliquis ;  Dr.Gollan- sec to hematuria- OCT 2024]- on amiodarone -Ok from hematology stand point to proceed with watchman device. [Dr.Lambert]  # GU : history of prostate cancer [2011]-status post brachytherapy/hematuria-status post IR ablation improved. No concerns of recurrence.   # DISPOSITION: # Aranesp next week # follow up in 4 weeks- MD; labs- cbc/bmp--- possible arapesp  Dr.B  Cc; Dr.Gollan, Dr.Lambert, Ms.Cannady   All questions were answered. The patient knows to call the clinic with any problems, questions or concerns.   Cindy JONELLE Joe, MD 11/11/2023 11:56 AM

## 2023-11-11 NOTE — Assessment & Plan Note (Deleted)
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 platelets 100s. DEC 2024-CT- [renal]- NO cirrhosis; spleen. 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  The differential diagnosis for hypercellularity/dyspoiesis-concurrent MDS with plasmacytosis/multiple myeloma.  Other possibility likely reactive secondary plasmacytosis. plasmacytosis ranging from 17% (aspirate to focally 70% by CD138 IHC on the biopsy/clot section with an overall average interpreted as 30%.  In addition there is aberrant cyclin D1 expression as well as apparent CD117 expression; however, definitive clonality could not be established given poor kappa/lambda ISH staining; however, this is interpreted to most likely represent a plasma cell neoplasm . Addendum: I reviewed with the pathologist-although unable to assess for clonality  of the bone marrow plasma cells.  However given-11:14 translocation-suggestive of clonality positive.  Plasma cells in bone marrow on average about 30% [patchy].  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  FISH panel for plasma cell neoplasm: 11:14 translocation. NGS for MDS- ASXL- NF-1; STAG-2; TET-2   # BM Bx revealed MDS with <5% blasts, normal karyotype and mutations in ASXL1, SRSF2, STAG2, TET2 and NF1. 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   # Interestingly -immunofixation shows IgA monoclonal protein with lambda light chain specificity- NOT QUANTIFIABLE. K/L= Normal. Beta 2- 2.8.  X-rays skeletal survey-negative for any bone lesions. JUNE 2025- whole body PET scan-negative for any bone lesions.  JULY 2025- Status post second opinion at [facility]- [Dr.Rubenstein]-discussed with Dr. Homer.  Currently awaiting review of bone marrow biopsy at [facility] discuss the next treatment options.  # Patient currently mainly symptomatic with the hemoglobin above 9.  At this time continue to hold any active therapies at this time.  Given on bone marrow- Storage Iron: Decreased/trace histiocytic iron stores. #Recommend gentle iron [iron biglycinate; 28 mg ] 1 pill a day.  This pill is unlikely to cause stomach upset or cause constipation.   # Hypothyroidsm-on Synthroid  June 2025 PET scan incidental -there is asymmetric uptake along the right thyroid  nodule. A neoplastic lesion is possible- recommend thyroid  ultrasound for further evaluation. Positive for a 2.5 cm nodule within the right mid thyroid  gland corresponding with the focus of hypermetabolic activity on prior PET-CT. Hypermetabolic activity on PET supersedes the TI-RADS classification system and biopsy is recommended  # A.fib [off Eliquis ; Dr.Gollan- sec to hematuria- OCT 2024]- on amiodarone -recommend reaching out to cardiology watchman device. [Dr.Lambert]  # GU : history of prostate cancer [2011]-status post brachytherapy/hematuria-status post IR ablation improved. No concerns of recurrence.   # DISPOSITION: # thyroid  Ultrasound in appx 2 weeks-  # follow up in 6 weeks- MD; labs- cbc/cmp;iron studies; ferritin;  LDH; haptoglobin-  Dr.B  Cc; Dr.Gollan, Dr.Lambert, Ms.Cannady

## 2023-11-13 LAB — HAPTOGLOBIN: Haptoglobin: 40 mg/dL (ref 34–355)

## 2023-11-19 ENCOUNTER — Inpatient Hospital Stay: Attending: Internal Medicine

## 2023-11-19 VITALS — BP 153/84

## 2023-11-19 DIAGNOSIS — E039 Hypothyroidism, unspecified: Secondary | ICD-10-CM | POA: Insufficient documentation

## 2023-11-19 DIAGNOSIS — Z8546 Personal history of malignant neoplasm of prostate: Secondary | ICD-10-CM | POA: Insufficient documentation

## 2023-11-19 DIAGNOSIS — I4819 Other persistent atrial fibrillation: Secondary | ICD-10-CM | POA: Insufficient documentation

## 2023-11-19 DIAGNOSIS — C9000 Multiple myeloma not having achieved remission: Secondary | ICD-10-CM | POA: Insufficient documentation

## 2023-11-19 DIAGNOSIS — D61818 Other pancytopenia: Secondary | ICD-10-CM | POA: Diagnosis not present

## 2023-11-19 DIAGNOSIS — D46Z Other myelodysplastic syndromes: Secondary | ICD-10-CM

## 2023-11-19 DIAGNOSIS — D4620 Refractory anemia with excess of blasts, unspecified: Secondary | ICD-10-CM | POA: Insufficient documentation

## 2023-11-19 MED ORDER — DARBEPOETIN ALFA 500 MCG/ML IJ SOSY
500.0000 ug | PREFILLED_SYRINGE | Freq: Once | INTRAMUSCULAR | Status: AC
  Administered 2023-11-19: 500 ug via SUBCUTANEOUS
  Filled 2023-11-19: qty 1

## 2023-11-25 ENCOUNTER — Ambulatory Visit: Payer: Self-pay

## 2023-11-25 VITALS — BP 132/72 | Ht 73.0 in | Wt 228.0 lb

## 2023-11-25 DIAGNOSIS — Z0000 Encounter for general adult medical examination without abnormal findings: Secondary | ICD-10-CM

## 2023-11-25 NOTE — Progress Notes (Signed)
 Subjective:   [name] [name] is a 78 y.o. who presents for a Medicare Wellness preventive visit.  As a reminder, Annual Wellness Visits don't include a physical exam, and some assessments may be limited, especially if this visit is performed virtually. We may recommend an in-person follow-up visit with your provider if needed.  Visit Complete: In person    Persons Participating in Visit: Patient.  AWV Questionnaire: Yes: Patient Medicare AWV questionnaire was completed by the patient on 11/24/23; I have confirmed that all information answered by patient is correct and no changes since this date.  Cardiac Risk Factors include: advanced age (>85men, >48 women);male gender;dyslipidemia;diabetes mellitus;Other (see comment);obesity (BMI >30kg/m2), Risk factor comments: OSA (bipap)     Objective:    Today's Vitals   11/25/23 0800  BP: 132/72  Weight: 228 lb (103.4 kg)  Height: 6' 1 (1.854 m)   Body mass index is 30.08 kg/m.     11/25/2023    8:10 AM 11/11/2023    9:46 AM 09/30/2023    1:14 PM 08/19/2023    3:06 PM 07/16/2023    3:17 PM 07/02/2023   10:50 AM 05/06/2023   10:13 AM  Advanced Directives  Does Patient Have a Medical Advance Directive? Yes Yes Yes Yes Yes Yes Yes  Type of Estate agent of [place];Living will Healthcare Power of [place];Living will Healthcare Power of [place];Living will Healthcare Power of [place];Living will Healthcare Power of [place];Living will Healthcare Power of [place];Living will Healthcare Power of [place];Living will  Does patient want to make changes to medical advance directive? No - Patient declined        Copy of Healthcare Power of Attorney in Chart? No - copy requested Yes - validated most recent copy scanned in chart (See row information) Yes - validated most recent copy scanned in chart (See row information) Yes - validated most recent copy scanned in chart (See row information)       Current Medications  (verified) Outpatient Encounter Medications as of 11/25/2023  Medication Sig   amiodarone  (PACERONE ) 200 MG tablet Take 1 tablet by mouth once daily   carvedilol  (COREG ) 25 MG tablet Take 1 tablet (25 mg total) by mouth 2 (two) times daily with a meal.   Cholecalciferol (VITAMIN D-3) 125 MCG (5000 UT) TABS Take 5,000 Units by mouth daily.   finasteride  (PROSCAR ) 5 MG tablet Take 1 tablet (5 mg total) by mouth daily.   folic acid  (FOLVITE ) 400 MCG tablet Take 400 mcg by mouth daily.   levothyroxine  (SYNTHROID ) 100 MCG tablet Take 1 tablet (100 mcg total) by mouth daily.   Multiple Vitamin (MULTIVITAMIN) tablet Take 1 tablet by mouth daily.   olmesartan  (BENICAR ) 40 MG tablet Take 1 tablet (40 mg total) by mouth daily.   Zinc 50 MG TABS Take 50 mg by mouth daily.   No facility-administered encounter medications on file as of 11/25/2023.    Allergies (verified) Patient has no known allergies.   History: Past Medical History:  Diagnosis Date   A-fib [facility])    Atrial fibrillation with RVR (HCC)    Dupuytren's contracture    Essential hypertension 09/05/2014   Hypertension    Hypokalemia    Hypothyroidism    Malignant neoplasm of prostate (HCC) 09/05/2014   prostate,skin of the scalp   Mitral regurgitation    a. echo 03/2015: echo 03/2015: EF 50-55%, no RWMA, no sufficienct to allow for LV diastolic fxn, mild MR, LA mildly dilated at 46 mm, RV  systolic fxn nl, PASP nl   Nephrolithiasis    New onset atrial fibrillation (HCC) 04/18/2015   OSA (obstructive sleep apnea) 05/11/2015   Persistent atrial fibrillation (HCC)    Skin cancer    Sleep apnea    Past Surgical History:  Procedure Laterality Date   COLONOSCOPY WITH PROPOFOL  N/A 04/18/2015   Procedure: COLONOSCOPY WITH PROPOFOL ;  Surgeon: Rogelia Copping, MD;  Location: [facility] ENDOSCOPY;  Service: Endoscopy;  Laterality: N/A;   ELECTROPHYSIOLOGIC STUDY N/A 05/05/2015   Procedure: CARDIOVERSION;  Surgeon: Deatrice DELENA Cage, MD;  Location: [facility]  ORS;  Service: Cardiovascular;  Laterality: N/A;   IR BONE MARROW BIOPSY & ASPIRATION  07/08/2023   IR EMBO TUMOR ORGAN ISCHEMIA INFARCT INC GUIDE ROADMAPPING  05/06/2023   IR RADIOLOGIST EVAL & MGMT  02/18/2023   IR RADIOLOGIST EVAL & MGMT  04/17/2023   IR RADIOLOGIST EVAL & MGMT  05/27/2023   RADIOACTIVE SEED IMPLANT N/A    Family History  Problem Relation Age of Onset   Heart attack Mother    Heart disease Father    Colon cancer Maternal Aunt    Arthritis Maternal Grandmother    Social History   Socioeconomic History   Marital status: Married    Spouse name: [name]   Number of children: 2   Years of education: Not on file   Highest education level: Bachelor's degree (e.g., BA, AB, BS)  Occupational History   Occupation: retired     Comment: Acupuncturist  Tobacco Use   Smoking status: Former    Current packs/day: 0.00    Average packs/day: 1 pack/day for 30.5 years (30.5 ttl pk-yrs)    Types: Cigarettes    Start date: 1966    Quit date: 09/05/1994    Years since quitting: 29.2   Smokeless tobacco: Former    Types: Chew    Quit date: 09/04/2004  Vaping Use   Vaping status: Never Used  Substance and Sexual Activity   Alcohol use: No   Drug use: No   Sexual activity: Not Currently  Other Topics Concern   Not on file  Social History Narrative   Plays racket ball twice a week, works outside. Indoor pets, dog and cat.    Social Drivers of Corporate investment banker Strain: Low Risk  (11/25/2023)   Overall Financial Resource Strain (CARDIA)    Difficulty of Paying Living Expenses: Not hard at all  Food Insecurity: No Food Insecurity (11/25/2023)   Hunger Vital Sign    Worried About Running Out of Food in the Last Year: Never true    Ran Out of Food in the Last Year: Never true  Transportation Needs: No Transportation Needs (11/25/2023)   PRAPARE - Administrator, Civil Service (Medical): No    Lack of Transportation (Non-Medical): No  Physical Activity:  Sufficiently Active (11/25/2023)   Exercise Vital Sign    Days of Exercise per Week: 2 days    Minutes of Exercise per Session: 90 min  Stress: No Stress Concern Present (11/25/2023)   [employer] of Occupational Health - Occupational Stress Questionnaire    Feeling of Stress: Not at all  Social Connections: Socially Integrated (11/25/2023)   Social Connection and Isolation Panel    Frequency of Communication with Friends and Family: Twice a week    Frequency of Social Gatherings with Friends and Family: More than three times a week    Attends Religious Services: More than 4 times per year  Active Member of Clubs or Organizations: Yes    Attends Engineer, structural: More than 4 times per year    Marital Status: Married    Tobacco Counseling Counseling given: Not Answered    Clinical Intake:  Pre-visit preparation completed: Yes  Pain : No/denies pain     BMI - recorded: 30.08 Nutritional Status: BMI > 30  Obese Nutritional Risks: None Diabetes: No  No results found for: HGBA1C   How often do you need to have someone help you when you read instructions, pamphlets, or other written materials from your doctor or pharmacy?: 1 - Never  Interpreter Needed?: No  Information entered by :: Vina Ned, CMA   Activities of Daily Living     11/25/2023    8:04 AM 11/24/2023    7:43 AM  In your present state of health, do you have any difficulty performing the following activities:  Hearing? 0 0  Vision? 0 0  Difficulty concentrating or making decisions? 0 0  Walking or climbing stairs? 0 0  Dressing or bathing? 0 0  Doing errands, shopping? 0 0  Preparing Food and eating ? N N  Using the Toilet? N N  In the past six months, have you accidently leaked urine? N N  Do you have problems with loss of bowel control? N N  Managing your Medications? N N  Managing your Finances? N N  Housekeeping or managing your Housekeeping? N N    Patient Care Team: Cannady,  Jolene T, NP as PCP - General (Nurse Practitioner) Perla Evalene PARAS, MD as Consulting Physician (Cardiology) Dasher, Alm LABOR, MD as Consulting Physician (Dermatology) Elma Zachary RAMAN [facility]) Rennie Cindy SAUNDERS, MD as Consulting Physician (Oncology) Cindie Ole DASEN, MD as Consulting Physician (Cardiology) Francisca Redell BROCKS, MD as Consulting Physician (Urology)  I have updated your Care Teams any recent Medical Services you may have received from other providers in the past year.     Assessment:   This is a routine wellness examination for Javanni.  Hearing/Vision screen Hearing Screening - Comments:: Denies hearing loss  Vision Screening - Comments:: Gets routine eye exams, [place], [place]   Goals Addressed               This Visit's Progress     Weight (lb) < 220 lb (99.8 kg) (pt-stated)   228 lb (103.4 kg)      Depression Screen     11/25/2023    8:08 AM 11/11/2023    9:56 AM 09/30/2023    1:20 PM 04/22/2023    8:51 AM 11/19/2022    8:37 AM 11/12/2022   10:26 AM 11/09/2021   10:29 AM  PHQ 2/9 Scores  PHQ - 2 Score 0 0 0 0 0 0 0  PHQ- 9 Score 0   0 0  0    Fall Risk     11/25/2023    8:11 AM 11/24/2023    7:43 AM 04/22/2023    8:52 AM 11/19/2022    8:40 AM 11/12/2022   10:26 AM  Fall Risk   Falls in the past year? 0 0 0 0 0  Number falls in past yr: 0 0 0 0 0  Injury with Fall? 0 0 0 0 0  Risk for fall due to : No Fall Risks  No Fall Risks No Fall Risks No Fall Risks  Follow up Falls evaluation completed  Falls evaluation completed Falls prevention discussed Falls evaluation completed  MEDICARE RISK AT HOME:  Medicare Risk at Home Any stairs in or around the home?: Yes (2 steps) If so, are there any without handrails?: Yes Home free of loose throw rugs in walkways, pet beds, electrical cords, etc?: Yes Adequate lighting in your home to reduce risk of falls?: Yes Life alert?: No Use of a cane, walker or w/c?: No Grab bars in the bathroom?:  No Shower chair or bench in shower?: Yes Elevated toilet seat or a handicapped toilet?: No  TIMED UP AND GO:  Was the test performed?  Yes  Length of time to ambulate 10 feet: 5 sec Gait steady and fast without use of assistive device  Cognitive Function: 6CIT completed        11/25/2023    8:11 AM 11/19/2022    8:41 AM 11/06/2021   11:05 AM 10/02/2020    9:58 AM 10/01/2019    9:51 AM  6CIT Screen  What Year? 0 points 0 points 0 points 0 points 0 points  What month? 0 points 0 points 0 points 0 points 0 points  What time? 0 points 0 points 0 points 0 points 0 points  Count back from 20 0 points 0 points 0 points 0 points 0 points  Months in reverse 0 points 0 points 0 points 0 points 2 points  Repeat phrase 0 points 0 points 0 points 0 points 0 points  Total Score 0 points 0 points 0 points 0 points 2 points    Immunizations Immunization History  Administered Date(s) Administered   Fluad Quad(high Dose 65+) 12/04/2018, 12/28/2019, 04/13/2021, 04/16/2022   Fluad Trivalent(High Dose 65+) 11/28/2022   INFLUENZA, HIGH DOSE SEASONAL PF 03/07/2016, 03/26/2017, 03/31/2018   Influenza,inj,Quad PF,6+ Mos 03/07/2015   PFIZER(Purple Top)SARS-COV-2 Vaccination 05/07/2019, 05/28/2019, 12/14/2019   Pneumococcal Conjugate-13 02/28/2014   Pneumococcal Polysaccharide-23 08/02/2009   Td 04/13/2020   Tdap 07/28/2008   Zoster, Live 08/14/2010    Screening Tests Health Maintenance  Topic Date Due   Influenza Vaccine  10/17/2023   COVID-19 Vaccine (4 - 2025-26 season) 11/17/2023   Zoster Vaccines- Shingrix (1 of 2) 01/21/2024 (Originally 02/25/1965)   Medicare Annual Wellness (AWV)  11/24/2024   DTaP/Tdap/Td (3 - Td or Tdap) 04/13/2030   Hepatitis C Screening  Completed   HPV VACCINES  Aged Out   Meningococcal B Vaccine  Aged Out   Pneumococcal Vaccine: 50+ Years  Discontinued   Colonoscopy  Discontinued    Health Maintenance Items Addressed: See Nurse Notes at the end of this  note  Additional Screening:  Vision Screening: Recommended annual ophthalmology exams for early detection of glaucoma and other disorders of the eye. Is the patient up to date with their annual eye exam?  Yes  Who is the provider or what is the name of the office in which the patient attends annual eye exams? [facility], [facility]  Dental Screening: Recommended annual dental exams for proper oral hygiene  Community Resource Referral / Chronic Care Management: CRR required this visit?  No   CCM required this visit?  No   Plan:    I have personally reviewed and noted the following in the patient's chart:   Medical and social history Use of alcohol, tobacco or illicit drugs  Current medications and supplements including opioid prescriptions. Patient is not currently taking opioid prescriptions. Functional ability and status Nutritional status Physical activity Advanced directives List of other physicians Hospitalizations, surgeries, and ER visits in previous 12 months Vitals Screenings to include cognitive,  depression, and falls Referrals and appointments  In addition, I have reviewed and discussed with patient certain preventive protocols, quality metrics, and best practice recommendations. A written personalized care plan for preventive services as well as general preventive health recommendations were provided to patient.   Vina Ned, CMA   11/25/2023   After Visit Summary: (In Person-Printed) AVS printed and given to the patient  Notes:  Will get flu vaccine  Declined Covid and Shingrix vaccine Screening colonoscopy no longer recommended due to age

## 2023-11-25 NOTE — Patient Instructions (Signed)
 [name],  Thank you for taking the time for your Medicare Wellness Visit. I appreciate your continued commitment to your health goals. Please review the care plan we discussed, and feel free to reach out if I can assist you further.  Medicare recommends these wellness visits once per year to help you and your care team stay ahead of potential health issues. These visits are designed to focus on prevention, allowing your provider to concentrate on managing your acute and chronic conditions during your regular appointments.  Please note that Annual Wellness Visits do not include a physical exam. Some assessments may be limited, especially if the visit was conducted virtually. If needed, we may recommend a separate in-person follow-up with your provider.  Ongoing Care Seeing your primary care provider every 3 to 6 months helps us  monitor your health and provide consistent, personalized care.   Referrals If a referral was made during today's visit and you haven't received any updates within two weeks, please contact the referred provider directly to check on the status.  Recommended Screenings:  Health Maintenance  Topic Date Due   Flu Shot  10/17/2023   COVID-19 Vaccine (4 - 2025-26 season) 11/17/2023   Zoster (Shingles) Vaccine (1 of 2) 01/21/2024*   Medicare Annual Wellness Visit  11/24/2024   DTaP/Tdap/Td vaccine (3 - Td or Tdap) 04/13/2030   Hepatitis C Screening  Completed   HPV Vaccine  Aged Out   Meningitis B Vaccine  Aged Out   Pneumococcal Vaccine for age over 55  Discontinued   Colon Cancer Screening  Discontinued  *Topic was postponed. The date shown is not the original due date.       11/25/2023    8:10 AM  Advanced Directives  Does Patient Have a Medical Advance Directive? Yes  Type of Estate agent of [place];Living will  Does patient want to make changes to medical advance directive? No - Patient declined  Copy of Healthcare Power of Attorney  in Chart? No - copy requested   Advance Care Planning is important because it: Ensures you receive medical care that aligns with your values, goals, and preferences. Provides guidance to your family and loved ones, reducing the emotional burden of decision-making during critical moments.  Vision: Annual vision screenings are recommended for early detection of glaucoma, cataracts, and diabetic retinopathy. These exams can also reveal signs of chronic conditions such as diabetes and high blood pressure.  Dental: Annual dental screenings help detect early signs of oral cancer, gum disease, and other conditions linked to overall health, including heart disease and diabetes.  Please see the attached documents for additional preventive care recommendations.

## 2023-12-10 ENCOUNTER — Inpatient Hospital Stay

## 2023-12-10 ENCOUNTER — Encounter: Payer: Self-pay | Admitting: Internal Medicine

## 2023-12-10 ENCOUNTER — Inpatient Hospital Stay (HOSPITAL_BASED_OUTPATIENT_CLINIC_OR_DEPARTMENT_OTHER): Admitting: Internal Medicine

## 2023-12-10 VITALS — BP 157/71 | HR 61 | Temp 97.6°F | Resp 12 | Ht 73.0 in | Wt 231.4 lb

## 2023-12-10 DIAGNOSIS — D46Z Other myelodysplastic syndromes: Secondary | ICD-10-CM

## 2023-12-10 DIAGNOSIS — D4620 Refractory anemia with excess of blasts, unspecified: Secondary | ICD-10-CM | POA: Diagnosis not present

## 2023-12-10 DIAGNOSIS — D649 Anemia, unspecified: Secondary | ICD-10-CM

## 2023-12-10 LAB — BASIC METABOLIC PANEL - CANCER CENTER ONLY
Anion gap: 6 (ref 5–15)
BUN: 26 mg/dL — ABNORMAL HIGH (ref 8–23)
CO2: 24 mmol/L (ref 22–32)
Calcium: 8.5 mg/dL — ABNORMAL LOW (ref 8.9–10.3)
Chloride: 109 mmol/L (ref 98–111)
Creatinine: 1.25 mg/dL — ABNORMAL HIGH (ref 0.61–1.24)
GFR, Estimated: 59 mL/min — ABNORMAL LOW (ref >60)
Glucose, Bld: 105 mg/dL — ABNORMAL HIGH (ref 70–99)
Potassium: 3.5 mmol/L (ref 3.5–5.1)
Sodium: 139 mmol/L (ref 135–145)

## 2023-12-10 LAB — CBC WITH DIFFERENTIAL (CANCER CENTER ONLY)
Abs Immature Granulocytes: 0.04 K/uL (ref 0.00–0.07)
Basophils Absolute: 0 K/uL (ref 0.0–0.1)
Basophils Relative: 0 %
Eosinophils Absolute: 0 K/uL (ref 0.0–0.5)
Eosinophils Relative: 0 %
HCT: 27.9 % — ABNORMAL LOW (ref 39.0–52.0)
Hemoglobin: 8.1 g/dL — ABNORMAL LOW (ref 13.0–17.0)
Immature Granulocytes: 2 %
Lymphocytes Relative: 48 %
Lymphs Abs: 1.3 K/uL (ref 0.7–4.0)
MCH: 27.7 pg (ref 26.0–34.0)
MCHC: 29 g/dL — ABNORMAL LOW (ref 30.0–36.0)
MCV: 95.5 fL (ref 80.0–100.0)
Monocytes Absolute: 0.2 K/uL (ref 0.1–1.0)
Monocytes Relative: 7 %
Neutro Abs: 1.2 K/uL — ABNORMAL LOW (ref 1.7–7.7)
Neutrophils Relative %: 43 %
Platelet Count: 180 K/uL (ref 150–400)
RBC: 2.92 MIL/uL — ABNORMAL LOW (ref 4.22–5.81)
RDW: 21.1 % — ABNORMAL HIGH (ref 11.5–15.5)
WBC Count: 2.7 K/uL — ABNORMAL LOW (ref 4.0–10.5)
nRBC: 0 % (ref 0.0–0.2)

## 2023-12-10 MED ORDER — DARBEPOETIN ALFA 500 MCG/ML IJ SOSY
500.0000 ug | PREFILLED_SYRINGE | Freq: Once | INTRAMUSCULAR | Status: AC
  Administered 2023-12-10: 500 ug via SUBCUTANEOUS
  Filled 2023-12-10: qty 1

## 2023-12-10 NOTE — Progress Notes (Signed)
 [facility] CONSULT NOTE  Patient Care Team: Valerio Melanie DASEN, NP as PCP - General (Nurse Practitioner) Perla Evalene PARAS, MD as Consulting Physician (Cardiology) Dasher, Alm LABOR, MD as Consulting Physician (Dermatology) Elma Zachary RAMAN [facility]) Rennie Cindy SAUNDERS, MD as Consulting Physician (Oncology) Cindie Ole DASEN, MD as Consulting Physician (Cardiology) Francisca Redell BROCKS, MD as Consulting Physician (Urology)  CHIEF COMPLAINTS/PURPOSE OF CONSULTATION: Pancytopenia  Oncology History Overview Note  # PANCYTOPENIA-[2019- thrombocytopebia hemoglobin- 10; leukopenia- 2.5; no diff. platelets 100s. DEC 2024-CT- [renal]- NO cirrhosis; spleen.   APRIL 2025- bone marrow-  # BONE MARROW, ASPIRATE, CLOT, CORE: - Hypercellular bone marrow (average 45%) with myeloid hyperplasia, erythroid hypoplasia and megakaryocytic dyspoiesis -  Plasmacytosis (17% aspirate; greater than 30% biopsy) with aberrant  CD56/cyclin D1 positivity consistent with a plasma cell neoplasm.  The differential diagnosis for hypercellularity/dyspoiesis-concurrent MDS with plasmacytosis/multiple myeloma.  Other possibility likely reactive secondary plasmacytosis. plasmacytosis ranging from 17% (aspirate to focally 70% by CD138 IHC on the biopsy/clot section with an overall average interpreted as 30%.  In addition there is aberrant cyclin D1 expression as well as apparent CD117 expression; however, definitive clonality could not be established given poor kappa/lambda ISH staining; however, this is interpreted to most likely represent a plasma cell neoplasm . Addendum: I reviewed with the pathologist-although unable to assess for clonality  of the bone marrow plasma cells.  However given-11:14 translocation-suggestive of clonality positive.  Plasma cells in bone marrow on average about 30% [patchy].  Given the quite not impressive serum electrophoresis/kappa lambda light chain ratio-suggestive of nonsecretory  multiple myeloma Vs reactive process.  FISH panel for plasma cell neoplasm: 11:14 translocation. 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  Alone.     [name] [name] 78 y.o.  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  Patient continues to complain of mild-moderate fatigue.  However he is still able to accomplish most of the daily activities without any significant fatigue.  Otherwise denies any fevers or chills.  No nausea no vomiting.  No weight loss.   Review of Systems  Constitutional:  Positive for malaise/fatigue. Negative for chills, diaphoresis, fever and weight loss.  HENT:  Negative for nosebleeds and sore throat.   Eyes:  Negative for double vision.  Respiratory:  Negative for cough, hemoptysis, sputum production, shortness of breath and wheezing.   Cardiovascular:  Negative for chest pain, palpitations, orthopnea and leg swelling.  Gastrointestinal:  Negative for abdominal pain, blood in stool, constipation, diarrhea, heartburn, melena, nausea and vomiting.  Genitourinary:  Negative for dysuria, frequency and urgency.  Musculoskeletal:  Negative for back pain and joint pain.  Skin: Negative.  Negative for itching and rash.  Neurological:  Negative for dizziness, tingling, focal weakness, weakness and headaches.  Endo/Heme/Allergies:  Does not bruise/bleed easily.  Psychiatric/Behavioral:  Negative for depression. The patient is not nervous/anxious and does not have insomnia.      MEDICAL HISTORY:  Past Medical History:   Diagnosis Date   A-fib [facility])    Atrial fibrillation with RVR (HCC)    Dupuytren's contracture  Essential hypertension 09/05/2014   Hypertension    Hypokalemia    Hypothyroidism    Malignant neoplasm of prostate (HCC) 09/05/2014   prostate,skin of the scalp   Mitral regurgitation    a. echo 03/2015: echo 03/2015: EF 50-55%, no RWMA, no sufficienct to allow for LV diastolic fxn, mild MR, LA mildly dilated at 46 mm, RV systolic fxn nl, PASP nl   Nephrolithiasis    New onset atrial fibrillation (HCC) 04/18/2015   OSA (obstructive sleep apnea) 05/11/2015   Persistent atrial fibrillation (HCC)    Skin cancer    Sleep apnea     SURGICAL HISTORY: Past Surgical History:  Procedure Laterality Date   COLONOSCOPY WITH PROPOFOL  N/A 04/18/2015   Procedure: COLONOSCOPY WITH PROPOFOL ;  Surgeon: Rogelia Copping, MD;  Location: [facility] ENDOSCOPY;  Service: Endoscopy;  Laterality: N/A;   ELECTROPHYSIOLOGIC STUDY N/A 05/05/2015   Procedure: CARDIOVERSION;  Surgeon: Deatrice DELENA Cage, MD;  Location: [facility] ORS;  Service: Cardiovascular;  Laterality: N/A;   IR BONE MARROW BIOPSY & ASPIRATION  07/08/2023   IR EMBO TUMOR ORGAN ISCHEMIA INFARCT INC GUIDE ROADMAPPING  05/06/2023   IR RADIOLOGIST EVAL & MGMT  02/18/2023   IR RADIOLOGIST EVAL & MGMT  04/17/2023   IR RADIOLOGIST EVAL & MGMT  05/27/2023   RADIOACTIVE SEED IMPLANT N/A     SOCIAL HISTORY: Social History   Socioeconomic History   Marital status: Married    Spouse name: [name]   Number of children: 2   Years of education: Not on file   Highest education level: Bachelor's degree (e.g., BA, AB, BS)  Occupational History   Occupation: retired     Comment: Acupuncturist  Tobacco Use   Smoking status: Former    Current packs/day: 0.00    Average packs/day: 1 pack/day for 30.5 years (30.5 ttl pk-yrs)    Types: Cigarettes    Start date: 1966    Quit date: 09/05/1994    Years since quitting: 29.2   Smokeless tobacco: Former    Types: Chew     Quit date: 09/04/2004  Vaping Use   Vaping status: Never Used  Substance and Sexual Activity   Alcohol use: No   Drug use: No   Sexual activity: Not Currently  Other Topics Concern   Not on file  Social History Narrative   Plays racket ball twice a week, works outside. Indoor pets, dog and cat.    Social Drivers of Corporate investment banker Strain: Low Risk  (11/25/2023)   Overall Financial Resource Strain (CARDIA)    Difficulty of Paying Living Expenses: Not hard at all  Food Insecurity: No Food Insecurity (11/25/2023)   Hunger Vital Sign    Worried About Running Out of Food in the Last Year: Never true    Ran Out of Food in the Last Year: Never true  Transportation Needs: No Transportation Needs (11/25/2023)   PRAPARE - Administrator, Civil Service (Medical): No    Lack of Transportation (Non-Medical): No  Physical Activity: Sufficiently Active (11/25/2023)   Exercise Vital Sign    Days of Exercise per Week: 2 days    Minutes of Exercise per Session: 90 min  Stress: No Stress Concern Present (11/25/2023)   [employer] of Occupational Health - Occupational Stress Questionnaire    Feeling of Stress: Not at all  Social Connections: Socially Integrated (11/25/2023)   Social Connection and Isolation Panel    Frequency of Communication with Friends and Family: Twice a week  Frequency of Social Gatherings with Friends and Family: More than three times a week    Attends Religious Services: More than 4 times per year    Active Member of [employer] or Organizations: Yes    Attends Engineer, structural: More than 4 times per year    Marital Status: Married  Catering manager Violence: Not At Risk (11/25/2023)   Humiliation, Afraid, Rape, and Kick questionnaire    Fear of Current or Ex-Partner: No    Emotionally Abused: No    Physically Abused: No    Sexually Abused: No    FAMILY HISTORY: Family History  Problem Relation Age of Onset   Heart attack Mother    Heart  disease Father    Colon cancer Maternal Aunt    Arthritis Maternal Grandmother     ALLERGIES:  has no known allergies.  MEDICATIONS:  Current Outpatient Medications  Medication Sig Dispense Refill   amiodarone  (PACERONE ) 200 MG tablet Take 1 tablet by mouth once daily 90 tablet 2   carvedilol  (COREG ) 25 MG tablet Take 1 tablet (25 mg total) by mouth 2 (two) times daily with a meal. 180 tablet 3   Cholecalciferol (VITAMIN D-3) 125 MCG (5000 UT) TABS Take 5,000 Units by mouth daily.     ferrous sulfate 325 (65 FE) MG tablet Take 325 mg by mouth daily with breakfast.     finasteride  (PROSCAR ) 5 MG tablet Take 1 tablet (5 mg total) by mouth daily. 90 tablet 4   folic acid  (FOLVITE ) 400 MCG tablet Take 400 mcg by mouth daily.     levothyroxine  (SYNTHROID ) 100 MCG tablet Take 1 tablet (100 mcg total) by mouth daily. 60 tablet 3   Multiple Vitamin (MULTIVITAMIN) tablet Take 1 tablet by mouth daily.     olmesartan  (BENICAR ) 40 MG tablet Take 1 tablet (40 mg total) by mouth daily. 90 tablet 4   Zinc 50 MG TABS Take 50 mg by mouth daily.     No current facility-administered medications for this visit.     PHYSICAL EXAMINATION:   Vitals:   12/10/23 0901 12/10/23 0924  BP: (!) 157/61 (!) 157/71  Pulse: 61   Resp: 12   Temp: 97.6 F (36.4 C)   SpO2: 100%     Filed Weights   12/10/23 0901  Weight: 231 lb 6.4 oz (105 kg)     Physical Exam Vitals and nursing note reviewed.  HENT:     Head: Normocephalic and atraumatic.     Mouth/Throat:     Pharynx: Oropharynx is clear.  Eyes:     Extraocular Movements: Extraocular movements intact.     Pupils: Pupils are equal, round, and reactive to light.  Cardiovascular:     Rate and Rhythm: Normal rate and regular rhythm.  Pulmonary:     Comments: Decreased breath sounds bilaterally.  Abdominal:     Palpations: Abdomen is soft.  Musculoskeletal:        General: Normal range of motion.     Cervical back: Normal range of motion.   Skin:    General: Skin is warm.  Neurological:     General: No focal deficit present.     Mental Status: He is alert and oriented to person, place, and time.  Psychiatric:        Behavior: Behavior normal.        Judgment: Judgment normal.      LABORATORY DATA:  I have reviewed the data as listed Lab Results  Component Value  Date   WBC 2.7 (L) 12/10/2023   HGB 8.1 (L) 12/10/2023   HCT 27.9 (L) 12/10/2023   MCV 95.5 12/10/2023   PLT 180 12/10/2023   Recent Labs    07/02/23 1215 08/19/23 1500 11/11/23 0944 12/10/23 0904  NA 139 140 141 139  K 4.0 4.1 4.1 3.5  CL 107 110 110 109  CO2 25 23 25 24   GLUCOSE 123* 103* 115* 105*  BUN 20 27* 26* 26*  CREATININE 1.24 1.34* 1.21 1.25*  CALCIUM 8.9 8.4* 8.9 8.5*  GFRNONAA 60* 54* >60 59*  PROT 7.0 6.0* 6.1*  --   ALBUMIN 4.2 4.0 3.8  --   AST 25 30 27   --   ALT 23 27 20   --   ALKPHOS 86 77 68  --   BILITOT 1.8* 1.6* 1.6*  --      No results found.   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 Homer. ] -Currently on surveillance.  # APRIL 2025- # BM Bx revealed LOW GRADE MDS [IPS-R-2] with <5% blasts, normal karyotype and mutations in ASXL1, SRSF2, STAG2, TET2 and NF1-  [Dr. Zeidner at [facility].]  -Currently on Aranesp  every 3 weeks.  # Today hemoglobin is 8.1 white count 2.6/platelets 160-slightly lower hemoglobin than previous.  Proceed with Aranesp  again every 3 weeks.  Iron levels adequate.  # Hypothyroidsm-on Synthroid  June 2025 PET scan incidental -there is asymmetric uptake along the right thyroid  nodule. A neoplastic lesion is possible- recommend thyroid  ultrasound for further evaluation. US - Thyroid - AUG Positive for a 2.5 cm nodule within the right mid thyroid  gland corresponding with the focus of hypermetabolic  activity on prior PET-CT.  Recommend biopsy at next visit.   # A.fib [off Eliquis ; Dr.Gollan- sec to hematuria- OCT 2024]- on amiodarone -Ok from hematology stand point to proceed with watchman device. [Dr.Lambert]  # Hx of hematuria-history of prostate cancer [2011; Dr.Snininski]]-status post brachytherapy/hematuria-status post IR ablation improved. No concerns of recurrence.  Continue oral iron.  # DISPOSITION: # Aranesp  today # in 3 weeks- H&H- Aranesp  # follow up in 6  weeks- MD; labs- cbc/bmp; iron studies; ferritin LDH- -- possible arapesp/possible Venofer-  Dr.B  Cc; Dr.Gollan, Dr.Lambert, Ms.Cannady    All questions were answered. The patient knows to call the clinic with any problems, questions or concerns.   Cindy JONELLE Joe, MD 12/10/2023 10:38 AM

## 2023-12-10 NOTE — Progress Notes (Signed)
 Fatigue/weakness: YES, NO WORSE Dyspena: NO Light headedness: AT TIMES Blood in stool: NO

## 2023-12-10 NOTE — Assessment & Plan Note (Addendum)
#   CONCURRENT MDS- MULTIPLE MYELOMA:  # MULTIPLE MYELOMA:  [s/p [facility] re-view: non-secretory- Plasma cells are increased and account for ~40% of overall marrow cellularity; Dr. Homer. ] -Currently on surveillance.  # APRIL 2025- # BM Bx revealed LOW GRADE MDS [IPS-R-2] with <5% blasts, normal karyotype and mutations in ASXL1, SRSF2, STAG2, TET2 and NF1-  [Dr. Zeidner at [facility].]  -Currently on Aranesp  every 3 weeks.  # Today hemoglobin is 8.1 white count 2.6/platelets 160-slightly lower hemoglobin than previous.  Proceed with Aranesp  again every 3 weeks.  Iron levels adequate.  # Hypothyroidsm-on Synthroid  June 2025 PET scan incidental -there is asymmetric uptake along the right thyroid  nodule. A neoplastic lesion is possible- recommend thyroid  ultrasound for further evaluation. US - Thyroid - AUG Positive for a 2.5 cm nodule within the right mid thyroid  gland corresponding with the focus of hypermetabolic activity on prior PET-CT.  Recommend biopsy at next visit.   # A.fib [off Eliquis ; Dr.Gollan- sec to hematuria- OCT 2024]- on amiodarone -Ok from hematology stand point to proceed with watchman device. [Dr.Lambert]  # Hx of hematuria-history of prostate cancer [2011; Dr.Snininski]]-status post brachytherapy/hematuria-status post IR ablation improved. No concerns of recurrence.  Continue oral iron.  # DISPOSITION: # Aranesp  today # in 3 weeks- H&H- Aranesp  # follow up in 6  weeks- MD; labs- cbc/bmp; iron studies; ferritin LDH- -- possible arapesp/possible Venofer-  Dr.B  Cc; Dr.Gollan, Dr.Lambert, Ms.Cannady

## 2023-12-11 DIAGNOSIS — Z85820 Personal history of malignant melanoma of skin: Secondary | ICD-10-CM | POA: Diagnosis not present

## 2023-12-11 DIAGNOSIS — D2261 Melanocytic nevi of right upper limb, including shoulder: Secondary | ICD-10-CM | POA: Diagnosis not present

## 2023-12-11 DIAGNOSIS — D225 Melanocytic nevi of trunk: Secondary | ICD-10-CM | POA: Diagnosis not present

## 2023-12-11 DIAGNOSIS — Z86006 Personal history of melanoma in-situ: Secondary | ICD-10-CM | POA: Diagnosis not present

## 2023-12-11 DIAGNOSIS — L570 Actinic keratosis: Secondary | ICD-10-CM | POA: Diagnosis not present

## 2023-12-11 DIAGNOSIS — D2262 Melanocytic nevi of left upper limb, including shoulder: Secondary | ICD-10-CM | POA: Diagnosis not present

## 2023-12-11 DIAGNOSIS — D2272 Melanocytic nevi of left lower limb, including hip: Secondary | ICD-10-CM | POA: Diagnosis not present

## 2023-12-11 DIAGNOSIS — Z85828 Personal history of other malignant neoplasm of skin: Secondary | ICD-10-CM | POA: Diagnosis not present

## 2023-12-11 DIAGNOSIS — D485 Neoplasm of uncertain behavior of skin: Secondary | ICD-10-CM | POA: Diagnosis not present

## 2023-12-11 DIAGNOSIS — C44329 Squamous cell carcinoma of skin of other parts of face: Secondary | ICD-10-CM | POA: Diagnosis not present

## 2023-12-23 DIAGNOSIS — D469 Myelodysplastic syndrome, unspecified: Secondary | ICD-10-CM | POA: Diagnosis not present

## 2023-12-23 DIAGNOSIS — D4989 Neoplasm of unspecified behavior of other specified sites: Secondary | ICD-10-CM | POA: Diagnosis not present

## 2023-12-24 ENCOUNTER — Ambulatory Visit (INDEPENDENT_AMBULATORY_CARE_PROVIDER_SITE_OTHER): Admitting: Urology

## 2023-12-24 VITALS — BP 155/88 | HR 61 | Ht 73.0 in | Wt 230.0 lb

## 2023-12-24 DIAGNOSIS — R310 Gross hematuria: Secondary | ICD-10-CM | POA: Diagnosis not present

## 2023-12-24 MED ORDER — FINASTERIDE 5 MG PO TABS: 5.0000 mg | ORAL_TABLET | Freq: Every day | ORAL | 4 refills | Status: AC

## 2023-12-24 NOTE — Progress Notes (Signed)
   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  Still has some occasional pink urine or small clots. 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  Could also consider hyperbaric oxygen treatments in the future. RTC 1 year, sooner if problems   Redell JAYSON Burnet, MD  [facility] [address], [address] [place], [place] [address] [phone]

## 2023-12-25 ENCOUNTER — Ambulatory Visit: Admitting: Urology

## 2023-12-31 ENCOUNTER — Inpatient Hospital Stay

## 2023-12-31 ENCOUNTER — Inpatient Hospital Stay: Attending: Internal Medicine

## 2023-12-31 VITALS — BP 155/80 | HR 59

## 2023-12-31 DIAGNOSIS — D46Z Other myelodysplastic syndromes: Secondary | ICD-10-CM

## 2023-12-31 DIAGNOSIS — D4620 Refractory anemia with excess of blasts, unspecified: Secondary | ICD-10-CM | POA: Diagnosis present

## 2023-12-31 LAB — HEMOGLOBIN AND HEMATOCRIT (CANCER CENTER ONLY)
HCT: 31.1 % — ABNORMAL LOW (ref 39.0–52.0)
Hemoglobin: 8.9 g/dL — ABNORMAL LOW (ref 13.0–17.0)

## 2023-12-31 MED ORDER — DARBEPOETIN ALFA 500 MCG/ML IJ SOSY
500.0000 ug | PREFILLED_SYRINGE | Freq: Once | INTRAMUSCULAR | Status: AC
  Administered 2023-12-31: 500 ug via SUBCUTANEOUS
  Filled 2023-12-31: qty 1

## 2024-01-13 ENCOUNTER — Ambulatory Visit (INDEPENDENT_AMBULATORY_CARE_PROVIDER_SITE_OTHER)

## 2024-01-13 DIAGNOSIS — Z23 Encounter for immunization: Secondary | ICD-10-CM

## 2024-01-14 ENCOUNTER — Other Ambulatory Visit: Payer: Self-pay | Admitting: Cardiovascular Disease

## 2024-01-21 ENCOUNTER — Other Ambulatory Visit: Payer: Self-pay | Admitting: Nurse Practitioner

## 2024-01-22 ENCOUNTER — Telehealth: Payer: Self-pay | Admitting: *Deleted

## 2024-01-22 ENCOUNTER — Inpatient Hospital Stay (HOSPITAL_BASED_OUTPATIENT_CLINIC_OR_DEPARTMENT_OTHER): Admitting: Nurse Practitioner

## 2024-01-22 ENCOUNTER — Inpatient Hospital Stay: Attending: Internal Medicine

## 2024-01-22 ENCOUNTER — Inpatient Hospital Stay

## 2024-01-22 ENCOUNTER — Other Ambulatory Visit: Payer: Self-pay | Admitting: *Deleted

## 2024-01-22 VITALS — BP 137/52 | HR 66 | Temp 99.0°F | Resp 16 | Ht 73.0 in | Wt 231.8 lb

## 2024-01-22 DIAGNOSIS — D46Z Other myelodysplastic syndromes: Secondary | ICD-10-CM

## 2024-01-22 DIAGNOSIS — Z79890 Hormone replacement therapy (postmenopausal): Secondary | ICD-10-CM | POA: Insufficient documentation

## 2024-01-22 DIAGNOSIS — C9000 Multiple myeloma not having achieved remission: Secondary | ICD-10-CM | POA: Diagnosis not present

## 2024-01-22 DIAGNOSIS — Z8546 Personal history of malignant neoplasm of prostate: Secondary | ICD-10-CM | POA: Diagnosis not present

## 2024-01-22 DIAGNOSIS — E041 Nontoxic single thyroid nodule: Secondary | ICD-10-CM

## 2024-01-22 DIAGNOSIS — D4620 Refractory anemia with excess of blasts, unspecified: Secondary | ICD-10-CM | POA: Insufficient documentation

## 2024-01-22 DIAGNOSIS — D649 Anemia, unspecified: Secondary | ICD-10-CM

## 2024-01-22 DIAGNOSIS — Z5181 Encounter for therapeutic drug level monitoring: Secondary | ICD-10-CM | POA: Diagnosis not present

## 2024-01-22 DIAGNOSIS — I4819 Other persistent atrial fibrillation: Secondary | ICD-10-CM | POA: Diagnosis not present

## 2024-01-22 DIAGNOSIS — E039 Hypothyroidism, unspecified: Secondary | ICD-10-CM | POA: Insufficient documentation

## 2024-01-22 DIAGNOSIS — Z79899 Other long term (current) drug therapy: Secondary | ICD-10-CM | POA: Diagnosis not present

## 2024-01-22 LAB — IRON AND TIBC
Iron: 87 ug/dL (ref 45–182)
Saturation Ratios: 24 % (ref 17.9–39.5)
TIBC: 370 ug/dL (ref 250–450)
UIBC: 283 ug/dL

## 2024-01-22 LAB — CBC WITH DIFFERENTIAL (CANCER CENTER ONLY)
Abs Immature Granulocytes: 0.21 K/uL — ABNORMAL HIGH (ref 0.00–0.07)
Basophils Absolute: 0.1 K/uL (ref 0.0–0.1)
Basophils Relative: 1 %
Eosinophils Absolute: 0.1 K/uL (ref 0.0–0.5)
Eosinophils Relative: 1 %
HCT: 27.7 % — ABNORMAL LOW (ref 39.0–52.0)
Hemoglobin: 8.2 g/dL — ABNORMAL LOW (ref 13.0–17.0)
Immature Granulocytes: 3 %
Lymphocytes Relative: 22 %
Lymphs Abs: 1.5 K/uL (ref 0.7–4.0)
MCH: 27.7 pg (ref 26.0–34.0)
MCHC: 29.6 g/dL — ABNORMAL LOW (ref 30.0–36.0)
MCV: 93.6 fL (ref 80.0–100.0)
Monocytes Absolute: 0.8 K/uL (ref 0.1–1.0)
Monocytes Relative: 13 %
Neutro Abs: 4 K/uL (ref 1.7–7.7)
Neutrophils Relative %: 60 %
Platelet Count: 160 K/uL (ref 150–400)
RBC: 2.96 MIL/uL — ABNORMAL LOW (ref 4.22–5.81)
RDW: 19.2 % — ABNORMAL HIGH (ref 11.5–15.5)
Smear Review: NORMAL
WBC Count: 6.6 K/uL (ref 4.0–10.5)
nRBC: 0.5 % — ABNORMAL HIGH (ref 0.0–0.2)

## 2024-01-22 LAB — BASIC METABOLIC PANEL WITH GFR
Anion gap: 9 (ref 5–15)
BUN: 22 mg/dL (ref 8–23)
CO2: 24 mmol/L (ref 22–32)
Calcium: 8.6 mg/dL — ABNORMAL LOW (ref 8.9–10.3)
Chloride: 107 mmol/L (ref 98–111)
Creatinine, Ser: 1.16 mg/dL (ref 0.61–1.24)
GFR, Estimated: >60 mL/min (ref >60)
Glucose, Bld: 112 mg/dL — ABNORMAL HIGH (ref 70–99)
Potassium: 3.6 mmol/L (ref 3.5–5.1)
Sodium: 140 mmol/L (ref 135–145)

## 2024-01-22 LAB — FERRITIN: Ferritin: 124 ng/mL (ref 24–336)

## 2024-01-22 LAB — LACTATE DEHYDROGENASE: LDH: 264 U/L — ABNORMAL HIGH (ref 98–192)

## 2024-01-22 MED ORDER — DARBEPOETIN ALFA 500 MCG/ML IJ SOSY
500.0000 ug | PREFILLED_SYRINGE | Freq: Once | INTRAMUSCULAR | Status: AC
  Administered 2024-01-22: 500 ug via SUBCUTANEOUS
  Filled 2024-01-22: qty 1

## 2024-01-22 NOTE — Telephone Encounter (Signed)
 Requested Prescriptions  Pending Prescriptions Disp Refills   olmesartan  (BENICAR ) 40 MG tablet [Pharmacy Med Name: Olmesartan  Medoxomil 40 MG Oral Tablet] 90 tablet 0    Sig: Take 1 tablet by mouth once daily     Cardiovascular:  Angiotensin Receptor Blockers Passed - 01/22/2024  3:37 PM      Passed - Cr in normal range and within 180 days    Creatinine  Date Value Ref Range Status  12/10/2023 1.25 (H) 0.61 - 1.24 mg/dL Final   Creatinine, Ser  Date Value Ref Range Status  01/22/2024 1.16 0.61 - 1.24 mg/dL Final         Passed - K in normal range and within 180 days    Potassium  Date Value Ref Range Status  01/22/2024 3.6 3.5 - 5.1 mmol/L Final         Passed - Patient is not pregnant      Passed - Last BP in normal range    BP Readings from Last 1 Encounters:  01/22/24 (!) 137/52         Passed - Valid encounter within last 6 months    Recent Outpatient Visits           3 months ago Atrial fibrillation with RVR (HCC)   [place] [facility] [place], Melanie T, NP   9 months ago Stage 3a chronic kidney disease (HCC)   [place] [facility] [place], Melanie DASEN, NP       Future Appointments             In 11 months Francisca, Redell BROCKS, MD [facility] [place]

## 2024-01-22 NOTE — Progress Notes (Signed)
 [name] [name]  Patient Care Team: [name] Melanie DASEN, NP as PCP - General (Nurse Practitioner) [name] Evalene PARAS, MD as Consulting Physician (Cardiology) Dasher, Alm LABOR, MD as Consulting Physician (Dermatology) [name] [name] [name] [name]) [name] Cindy SAUNDERS, MD as Consulting Physician (Oncology) [name] Ole DASEN, MD as Consulting Physician (Cardiology) [name] Redell BROCKS, MD as Consulting Physician (Urology)  CHIEF COMPLAINTS/PURPOSE OF CONSULTATION: Pancytopenia  Oncology History Overview [name]  # PANCYTOPENIA-[2019- thrombocytopebia hemoglobin- 10; leukopenia- 2.5; no diff. platelets 100s. DEC 2024-CT- [renal]- NO cirrhosis; spleen.   APRIL 2025- bone marrow-  # BONE MARROW, ASPIRATE, CLOT, CORE: - Hypercellular bone marrow (average 45%) with myeloid hyperplasia, erythroid hypoplasia and megakaryocytic dyspoiesis -  Plasmacytosis (17% aspirate; greater than 30% biopsy) with aberrant  CD56/cyclin D1 positivity consistent with a plasma cell neoplasm.  The differential diagnosis for hypercellularity/dyspoiesis-concurrent MDS with plasmacytosis/multiple myeloma.  Other possibility likely reactive secondary plasmacytosis. plasmacytosis ranging from 17% (aspirate to focally 70% by CD138 IHC on the biopsy/clot section with an overall average interpreted as 30%.  In addition there is aberrant cyclin D1 expression as well as apparent CD117 expression; however, definitive clonality could not be established given poor kappa/lambda ISH staining; however, this is interpreted to most likely represent a plasma cell neoplasm . Addendum: I reviewed with the pathologist-although unable to assess for clonality  of the bone marrow plasma cells.  However given-11:14 translocation-suggestive of clonality positive.  Plasma cells in bone marrow on average about 30% [patchy].  Given the quite not impressive serum electrophoresis/kappa lambda light chain ratio-suggestive of nonsecretory  multiple myeloma Vs reactive process.  FISH panel for plasma cell neoplasm: 11:14 translocation. 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  Alone.     [name] [name] 78 y.o.  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  Patient continues to complain of mild-moderate fatigue.  However he is still able to accomplish most of the daily activities without any significant fatigue.  Otherwise denies any fevers or chills.  No nausea no vomiting.  No weight loss.   Review of Systems  Constitutional:  Positive for malaise/fatigue. Negative for chills, diaphoresis, fever and weight loss.  HENT:  Negative for nosebleeds and sore throat.   Eyes:  Negative for double vision.  Respiratory:  Negative for cough, hemoptysis, sputum production, shortness of breath and wheezing.   Cardiovascular:  Negative for chest pain, palpitations, orthopnea and leg swelling.  Gastrointestinal:  Negative for abdominal pain, blood in stool, constipation, diarrhea, heartburn, melena, nausea and vomiting.  Genitourinary:  Negative for dysuria, frequency and urgency.  Musculoskeletal:  Negative for back pain and joint pain.  Skin: Negative.  Negative for itching and rash.  Neurological:  Negative for dizziness, tingling, focal weakness, weakness and headaches.  Endo/Heme/Allergies:  Does not bruise/bleed easily.  Psychiatric/Behavioral:  Negative for depression. The patient is not nervous/anxious and does not have insomnia.      MEDICAL HISTORY:  Past Medical History:   Diagnosis Date   A-fib [facility])    Atrial fibrillation with RVR (HCC)    Dupuytren's contracture  Essential hypertension 09/05/2014   Hypertension    Hypokalemia    Hypothyroidism    Malignant neoplasm of prostate (HCC) 09/05/2014   prostate,skin of the scalp   Mitral regurgitation    a. echo 03/2015: echo 03/2015: EF 50-55%, no RWMA, no sufficienct to allow for LV diastolic fxn, mild MR, LA mildly dilated at 46 mm, RV systolic fxn nl, PASP nl   Nephrolithiasis    New onset atrial fibrillation (HCC) 04/18/2015   OSA (obstructive sleep apnea) 05/11/2015   Persistent atrial fibrillation (HCC)    Skin cancer    Sleep apnea     SURGICAL HISTORY: Past Surgical History:  Procedure Laterality Date   COLONOSCOPY WITH PROPOFOL  N/A 04/18/2015   Procedure: COLONOSCOPY WITH PROPOFOL ;  Surgeon: [name] Copping, MD;  Location: [facility];  Service: Endoscopy;  Laterality: N/A;   ELECTROPHYSIOLOGIC STUDY N/A 05/05/2015   Procedure: CARDIOVERSION;  Surgeon: [name] DELENA Cage, MD;  Location: [facility] ORS;  Service: Cardiovascular;  Laterality: N/A;   IR BONE MARROW BIOPSY & ASPIRATION  07/08/2023   IR EMBO TUMOR ORGAN ISCHEMIA INFARCT [name] GUIDE ROADMAPPING  05/06/2023   IR RADIOLOGIST EVAL & MGMT  02/18/2023   IR RADIOLOGIST EVAL & MGMT  04/17/2023   IR RADIOLOGIST EVAL & MGMT  05/27/2023   RADIOACTIVE SEED IMPLANT N/A     SOCIAL HISTORY: Social History   Socioeconomic History   Marital status: Married    Spouse name: [name] [name]   Number of children: 2   Years of education: Not on file   Highest education level: Bachelor's degree (e.g., BA, AB, BS)  Occupational History   Occupation: retired     Comment: acupuncturist  Tobacco Use   Smoking status: Former    Current packs/day: 0.00    Average packs/day: 1 pack/day for 30.5 years (30.5 ttl pk-yrs)    Types: Cigarettes    Start date: 1966    Quit date: 09/05/1994    Years since quitting: 29.4   Smokeless tobacco: Former    Types: Chew     Quit date: 09/04/2004  Vaping Use   Vaping status: Never Used  Substance and Sexual Activity   Alcohol use: No   Drug use: No   Sexual activity: Not Currently  Other Topics Concern   Not on file  Social History Narrative   Plays racket ball twice a week, works outside. Indoor pets, dog and cat.    Social Drivers of Corporate Investment Banker Strain: Low Risk  (11/25/2023)   Overall Financial Resource Strain (CARDIA)    Difficulty of Paying Living Expenses: Not hard at all  Food Insecurity: No Food Insecurity (11/25/2023)   Hunger Vital Sign    Worried About Running Out of Food in the Last Year: Never true    Ran Out of Food in the Last Year: Never true  Transportation Needs: No Transportation Needs (11/25/2023)   PRAPARE - Administrator, Civil Service (Medical): No    Lack of Transportation (Non-Medical): No  Physical Activity: Sufficiently Active (11/25/2023)   Exercise Vital Sign    Days of Exercise per Week: 2 days    Minutes of Exercise per Session: 90 min  Stress: No Stress Concern Present (11/25/2023)   [employer] of Occupational Health - Occupational Stress Questionnaire    Feeling of Stress: Not at all  Social Connections: Socially Integrated (11/25/2023)   Social Connection and Isolation Panel    Frequency of Communication with Friends and Family: Twice a week  Frequency of Social Gatherings with Friends and Family: More than three times a week    Attends Religious Services: More than 4 times per year    Active Member of [employer] or Organizations: Yes    Attends Engineer, Structural: More than 4 times per year    Marital Status: Married  Catering Manager Violence: Not At Risk (11/25/2023)   Humiliation, Afraid, Rape, and Kick questionnaire    Fear of Current or Ex-Partner: No    Emotionally Abused: No    Physically Abused: No    Sexually Abused: No    FAMILY HISTORY: Family History  Problem Relation Age of Onset   Heart attack Mother    Heart  disease Father    Colon cancer Maternal Aunt    Arthritis Maternal Grandmother     ALLERGIES:  has no known allergies.  MEDICATIONS:  Current Outpatient Medications  Medication Sig Dispense Refill   amiodarone  (PACERONE ) 200 MG tablet Take 1 tablet by mouth once daily 90 tablet 0   carvedilol  (COREG ) 25 MG tablet Take 1 tablet (25 mg total) by mouth 2 (two) times daily with a meal. 180 tablet 3   Cholecalciferol (VITAMIN D-3) 125 MCG (5000 UT) TABS Take 5,000 Units by mouth daily.     ferrous sulfate 325 (65 FE) MG tablet Take 325 mg by mouth daily with breakfast.     finasteride  (PROSCAR ) 5 MG tablet Take 1 tablet (5 mg total) by mouth daily. 90 tablet 4   folic acid  (FOLVITE ) 400 MCG tablet Take 400 mcg by mouth daily.     levothyroxine  (SYNTHROID ) 100 MCG tablet Take 1 tablet (100 mcg total) by mouth daily. 60 tablet 3   Multiple Vitamin (MULTIVITAMIN) tablet Take 1 tablet by mouth daily.     olmesartan  (BENICAR ) 40 MG tablet Take 1 tablet (40 mg total) by mouth daily. 90 tablet 4   Zinc 50 MG TABS Take 50 mg by mouth daily.     No current facility-administered medications for this visit.     PHYSICAL EXAMINATION:   Vitals:   01/22/24 1009  BP: (!) 137/52  Pulse: 66  Resp: 16  Temp: 99 F (37.2 C)    Filed Weights   01/22/24 1009  Weight: 231 lb 12.8 oz (105.1 kg)     Physical Exam Vitals and nursing [name] reviewed.  HENT:     Head: Normocephalic and atraumatic.     Mouth/Throat:     Pharynx: Oropharynx is clear.  Eyes:     Extraocular Movements: Extraocular movements intact.     Pupils: Pupils are equal, round, and reactive to light.  Cardiovascular:     Rate and Rhythm: Normal rate and regular rhythm.  Pulmonary:     Comments: Decreased breath sounds bilaterally.  Abdominal:     Palpations: Abdomen is soft.  Musculoskeletal:        General: Normal range of motion.     Cervical back: Normal range of motion.  Skin:    General: Skin is warm.   Neurological:     General: No focal deficit present.     Mental Status: He is alert and oriented to person, place, and time.  Psychiatric:        Behavior: Behavior normal.        Judgment: Judgment normal.      LABORATORY DATA:  I have reviewed the data as listed Lab Results  Component Value Date   WBC 6.6 01/22/2024   HGB 8.2 (L)  01/22/2024   HCT 27.7 (L) 01/22/2024   MCV 93.6 01/22/2024   PLT 160 01/22/2024   Recent Labs    07/02/23 1215 07/02/23 1215 08/19/23 1500 11/11/23 0944 12/10/23 0904 01/22/24 0952  NA 139  --  140 141 139 140  K 4.0  --  4.1 4.1 3.5 3.6  CL 107  --  110 110 109 107  CO2 25  --  23 25 24 24   GLUCOSE 123*  --  103* 115* 105* 112*  BUN 20  --  27* 26* 26* 22  CREATININE 1.24   < > 1.34* 1.21 1.25* 1.16  CALCIUM 8.9  --  8.4* 8.9 8.5* 8.6*  GFRNONAA 60*   < > 54* >60 59* >60  PROT 7.0  --  6.0* 6.1*  --   --   ALBUMIN 4.2  --  4.0 3.8  --   --   AST 25  --  30 27  --   --   ALT 23  --  27 20  --   --   ALKPHOS 86  --  77 68  --   --   BILITOT 1.8*  --  1.6* 1.6*  --   --    < > = values in this interval not displayed.     No results found.   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 Homer. ] -Currently on surveillance.   # APRIL 2025- # BM Bx revealed LOW GRADE MDS [IPS-R-2] with <5% blasts, normal karyotype and mutations in ASXL1, SRSF2, STAG2, TET2 and NF1-  [Dr. Zeidner at [facility].]  -Currently on Aranesp  every 3 weeks.  # Today, hemoglobin 8.2. Platelets stable at 160. WBC 6.6, ANC 4.0. August 2025 ferritin 66, iron sat 37%. He has not required IV iron. Today's iron levels are pending. He has not achieved response and therefore, we will increase to aranesp  every 2 weeks. Continue oral iron.    # Hypothyroidsm-on Synthroid  June  2025 PET scan incidental -there is asymmetric uptake along the right thyroid  nodule. A neoplastic lesion is possible- s/p thyroid  ultrasound. US - Thyroid - AUG Positive for a 2.5 cm nodule within the right mid thyroid  gland corresponding with the focus of hypermetabolic activity on prior PET-CT.  Recommend biopsy. Patient agrees.    # A.fib [off Eliquis ; Dr.Gollan- sec to hematuria- OCT 2024]- on amiodarone -Ok from hematology stand point to proceed with watchman device. [Dr.Lambert]   # Hx of hematuria-history of prostate cancer [2011; Dr.Snininski]]-status post brachytherapy/hematuria-status post IR ablation improved. No concerns of recurrence.  Continue oral iron.   # DISPOSITION: # Aranesp  today # Thyroid  biopsy # 2 weeks - lab (H&H), aranesp  # 4 weeks- lab (H&H), aranesp  # 6 weeks- lab (cbc, bmp, ferritin, iron studies, ldh), Dr [name], aranesp  vs venofer- la  No problem-specific Assessment & Plan notes found for this encounter.  All questions were answered. The patient knows to call the clinic with any problems, questions or concerns.  [name] KANDICE Dawn, NP 01/22/2024

## 2024-01-22 NOTE — Telephone Encounter (Signed)
 Spoke with patient. Provided pt with apts times for u/s guided bx of thyroid .  Pt accepted apt for Tues 11/11 at 2:30pm. Pt instructed to arrive at 2p [place] medical mall. Pt may drive himself. He was informed that he does not need to be NPO.

## 2024-01-22 NOTE — Progress Notes (Signed)
 Patient says that he is stuffy, then runny nose temp-99.0 he is using coricidin

## 2024-01-26 NOTE — Progress Notes (Signed)
 Patient for US  guided FNA RT Mid Thyroid  Nodule Biopsy on Tues 01/27/24, I called and spoke with the patient on the phone and gave pre-procedure instructions. Pt was made aware to be here at 2p and check in at the [facility] registration desk. Pt stated understanding.  Called 01/26/24

## 2024-01-27 ENCOUNTER — Ambulatory Visit
Admission: RE | Admit: 2024-01-27 | Discharge: 2024-01-27 | Disposition: A | Discharge status: Home / Self Care | Source: Ambulatory Visit | Attending: Nurse Practitioner | Admitting: Nurse Practitioner

## 2024-01-27 DIAGNOSIS — R899 Unspecified abnormal finding in specimens from other organs, systems and tissues: Secondary | ICD-10-CM | POA: Insufficient documentation

## 2024-01-27 DIAGNOSIS — E041 Nontoxic single thyroid nodule: Secondary | ICD-10-CM

## 2024-01-27 MED ORDER — LIDOCAINE HCL (PF) 1 % IJ SOLN
2.0000 mL | Freq: Once | INTRAMUSCULAR | Status: AC
  Administered 2024-01-27: 2 mL via SUBCUTANEOUS
  Filled 2024-01-27: qty 2

## 2024-01-27 MED ORDER — LIDOCAINE 1% INJECTION FOR CIRCUMCISION
2.0000 mL | INJECTION | Freq: Once | INTRAVENOUS | Status: DC
  Filled 2024-01-27: qty 2

## 2024-01-30 LAB — CYTOLOGY - NON PAP

## 2024-02-03 DIAGNOSIS — L905 Scar conditions and fibrosis of skin: Secondary | ICD-10-CM | POA: Diagnosis not present

## 2024-02-03 DIAGNOSIS — C44329 Squamous cell carcinoma of skin of other parts of face: Secondary | ICD-10-CM | POA: Diagnosis not present

## 2024-02-04 ENCOUNTER — Other Ambulatory Visit: Payer: Self-pay

## 2024-02-04 DIAGNOSIS — D46Z Other myelodysplastic syndromes: Secondary | ICD-10-CM

## 2024-02-05 ENCOUNTER — Inpatient Hospital Stay

## 2024-02-05 VITALS — BP 114/72

## 2024-02-05 DIAGNOSIS — D46Z Other myelodysplastic syndromes: Secondary | ICD-10-CM

## 2024-02-05 DIAGNOSIS — D4620 Refractory anemia with excess of blasts, unspecified: Secondary | ICD-10-CM | POA: Diagnosis not present

## 2024-02-05 LAB — HEMOGLOBIN AND HEMATOCRIT (CANCER CENTER ONLY)
HCT: 28 % — ABNORMAL LOW (ref 39.0–52.0)
Hemoglobin: 8.2 g/dL — ABNORMAL LOW (ref 13.0–17.0)

## 2024-02-05 MED ORDER — DARBEPOETIN ALFA 500 MCG/ML IJ SOSY
500.0000 ug | PREFILLED_SYRINGE | Freq: Once | INTRAMUSCULAR | Status: AC
  Administered 2024-02-05: 500 ug via SUBCUTANEOUS
  Filled 2024-02-05: qty 1

## 2024-02-17 DIAGNOSIS — Z79890 Hormone replacement therapy (postmenopausal): Secondary | ICD-10-CM | POA: Diagnosis not present

## 2024-02-17 DIAGNOSIS — D4989 Neoplasm of unspecified behavior of other specified sites: Secondary | ICD-10-CM | POA: Diagnosis not present

## 2024-02-17 DIAGNOSIS — I129 Hypertensive chronic kidney disease with stage 1 through stage 4 chronic kidney disease, or unspecified chronic kidney disease: Secondary | ICD-10-CM | POA: Diagnosis not present

## 2024-02-17 DIAGNOSIS — Z87891 Personal history of nicotine dependence: Secondary | ICD-10-CM | POA: Diagnosis not present

## 2024-02-17 DIAGNOSIS — D469 Myelodysplastic syndrome, unspecified: Secondary | ICD-10-CM | POA: Diagnosis not present

## 2024-02-17 DIAGNOSIS — C9000 Multiple myeloma not having achieved remission: Secondary | ICD-10-CM | POA: Diagnosis not present

## 2024-02-17 DIAGNOSIS — N189 Chronic kidney disease, unspecified: Secondary | ICD-10-CM | POA: Diagnosis not present

## 2024-02-17 DIAGNOSIS — Z79899 Other long term (current) drug therapy: Secondary | ICD-10-CM | POA: Diagnosis not present

## 2024-02-17 DIAGNOSIS — E039 Hypothyroidism, unspecified: Secondary | ICD-10-CM | POA: Diagnosis not present

## 2024-02-17 DIAGNOSIS — D630 Anemia in neoplastic disease: Secondary | ICD-10-CM | POA: Diagnosis not present

## 2024-02-19 ENCOUNTER — Inpatient Hospital Stay: Attending: Internal Medicine

## 2024-02-19 ENCOUNTER — Inpatient Hospital Stay

## 2024-02-19 VITALS — BP 132/71

## 2024-02-19 DIAGNOSIS — I4819 Other persistent atrial fibrillation: Secondary | ICD-10-CM | POA: Insufficient documentation

## 2024-02-19 DIAGNOSIS — D46Z Other myelodysplastic syndromes: Secondary | ICD-10-CM

## 2024-02-19 DIAGNOSIS — C9000 Multiple myeloma not having achieved remission: Secondary | ICD-10-CM | POA: Diagnosis not present

## 2024-02-19 DIAGNOSIS — E039 Hypothyroidism, unspecified: Secondary | ICD-10-CM | POA: Diagnosis not present

## 2024-02-19 DIAGNOSIS — D61818 Other pancytopenia: Secondary | ICD-10-CM | POA: Insufficient documentation

## 2024-02-19 DIAGNOSIS — Z8546 Personal history of malignant neoplasm of prostate: Secondary | ICD-10-CM | POA: Insufficient documentation

## 2024-02-19 DIAGNOSIS — D4620 Refractory anemia with excess of blasts, unspecified: Secondary | ICD-10-CM | POA: Insufficient documentation

## 2024-02-19 LAB — HEMOGLOBIN AND HEMATOCRIT (CANCER CENTER ONLY)
HCT: 30.3 % — ABNORMAL LOW (ref 39.0–52.0)
Hemoglobin: 8.8 g/dL — ABNORMAL LOW (ref 13.0–17.0)

## 2024-02-19 MED ORDER — DARBEPOETIN ALFA 500 MCG/ML IJ SOSY
500.0000 ug | PREFILLED_SYRINGE | Freq: Once | INTRAMUSCULAR | Status: AC
  Administered 2024-02-19: 500 ug via SUBCUTANEOUS
  Filled 2024-02-19: qty 1

## 2024-03-05 ENCOUNTER — Inpatient Hospital Stay

## 2024-03-05 ENCOUNTER — Encounter: Payer: Self-pay | Admitting: Internal Medicine

## 2024-03-05 ENCOUNTER — Inpatient Hospital Stay: Admitting: Internal Medicine

## 2024-03-05 DIAGNOSIS — D46Z Other myelodysplastic syndromes: Secondary | ICD-10-CM | POA: Diagnosis not present

## 2024-03-05 DIAGNOSIS — D4620 Refractory anemia with excess of blasts, unspecified: Secondary | ICD-10-CM | POA: Diagnosis not present

## 2024-03-05 LAB — CBC WITH DIFFERENTIAL (CANCER CENTER ONLY)
Abs Immature Granulocytes: 0.1 K/uL — ABNORMAL HIGH (ref 0.00–0.07)
Basophils Absolute: 0 K/uL (ref 0.0–0.1)
Basophils Relative: 1 %
Eosinophils Absolute: 0 K/uL (ref 0.0–0.5)
Eosinophils Relative: 1 %
HCT: 31.2 % — ABNORMAL LOW (ref 39.0–52.0)
Hemoglobin: 8.8 g/dL — ABNORMAL LOW (ref 13.0–17.0)
Immature Granulocytes: 3 %
Lymphocytes Relative: 43 %
Lymphs Abs: 1.5 K/uL (ref 0.7–4.0)
MCH: 26.9 pg (ref 26.0–34.0)
MCHC: 28.2 g/dL — ABNORMAL LOW (ref 30.0–36.0)
MCV: 95.4 fL (ref 80.0–100.0)
Monocytes Absolute: 0.4 K/uL (ref 0.1–1.0)
Monocytes Relative: 12 %
Neutro Abs: 1.4 K/uL — ABNORMAL LOW (ref 1.7–7.7)
Neutrophils Relative %: 40 %
Platelet Count: 194 K/uL (ref 150–400)
RBC: 3.27 MIL/uL — ABNORMAL LOW (ref 4.22–5.81)
RDW: 20.5 % — ABNORMAL HIGH (ref 11.5–15.5)
Smear Review: NORMAL
WBC Count: 3.5 K/uL — ABNORMAL LOW (ref 4.0–10.5)
nRBC: 0.6 % — ABNORMAL HIGH (ref 0.0–0.2)

## 2024-03-05 LAB — IRON AND TIBC
Iron: 141 ug/dL (ref 45–182)
Saturation Ratios: 36 % (ref 17.9–39.5)
TIBC: 395 ug/dL (ref 250–450)
UIBC: 254 ug/dL

## 2024-03-05 LAB — BASIC METABOLIC PANEL WITH GFR
Anion gap: 11 (ref 5–15)
BUN: 28 mg/dL — ABNORMAL HIGH (ref 8–23)
CO2: 25 mmol/L (ref 22–32)
Calcium: 9.3 mg/dL (ref 8.9–10.3)
Chloride: 105 mmol/L (ref 98–111)
Creatinine, Ser: 1.61 mg/dL — ABNORMAL HIGH (ref 0.61–1.24)
GFR, Estimated: 43 mL/min — ABNORMAL LOW
Glucose, Bld: 101 mg/dL — ABNORMAL HIGH (ref 70–99)
Potassium: 3.9 mmol/L (ref 3.5–5.1)
Sodium: 141 mmol/L (ref 135–145)

## 2024-03-05 LAB — FERRITIN: Ferritin: 209 ng/mL (ref 24–336)

## 2024-03-05 LAB — LACTATE DEHYDROGENASE: LDH: 360 U/L — ABNORMAL HIGH (ref 105–235)

## 2024-03-05 MED ORDER — DARBEPOETIN ALFA 500 MCG/ML IJ SOSY
500.0000 ug | PREFILLED_SYRINGE | Freq: Once | INTRAMUSCULAR | Status: AC
  Administered 2024-03-05: 500 ug via SUBCUTANEOUS

## 2024-03-05 NOTE — Assessment & Plan Note (Addendum)
#   CONCURRENT MDS- MULTIPLE MYELOMA:  # MULTIPLE MYELOMA:  [s/p [facility] re-view: non-secretory- Plasma cells are increased and account for ~40% of overall marrow cellularity; Dr. Homer. ] -Currently on surveillance.  # APRIL 2025- # BM Bx revealed LOW GRADE MDS [IPS-R-2] with <5% blasts, normal karyotype and mutations in ASXL1, SRSF2, STAG2, TET2 and NF1-  [Dr. Zeidner at [facility].]  -Currently on Aranesp  every 2 weeks.  # Today hemoglobin is 8.1 white count 2.6/platelets 160-slightly lower hemoglobin than previous.  Proceed with Aranesp  again every 3 weeks.  Iron levels adequate.  If hemoglobin not improving or getting worse on Aranesp -Will consider repeating a bone marrow biopsy for further evaluation.  # Hypothyroidsm-on Synthroid  June 2025 PET scan incidental -there is asymmetric uptake along the right thyroid  nodule. A neoplastic lesion is possible- recommend thyroid  ultrasound for further evaluation. US - Thyroid - AUG Positive for a 2.5 cm nodule within the right mid thyroid  gland orresponding with the focus of hypermetabolic activity on prior PET-CT.  Recommend biopsy at next visit.   # A.fib [off Eliquis ; Dr.Gollan- sec to hematuria- OCT 2024]- on amiodarone -Ok from hematology stand point to proceed with watchman device. [Dr.Lambert]  # Hx of hematuria-history of prostate cancer [2011; Dr.Snininski]]-status post brachytherapy/hematuria-status post IR ablation improved. No concerns of recurrence.  Continue oral iron.  # DISPOSITION: # Aranesp  today; NO venofer # in 2 weeks- H&H- Aranesp  x4  # follow up in 8  weeks- MD; labs- cbc/bmp; iron studies; ferritin LDH- MM panel; K/l light chains--- possible arapesp/possible Venofer-  Dr.B  Cc; Dr.Gollan, Dr.Lambert, Ms.Cannady

## 2024-03-05 NOTE — Progress Notes (Signed)
 Patient states noticing blood in his urine, has been in contact with urologist regarding this issue.

## 2024-03-05 NOTE — Addendum Note (Signed)
 Addended by: LAEL BROWNING A on: 03/05/2024 09:56 AM   Modules accepted: Orders

## 2024-03-05 NOTE — Progress Notes (Signed)
 [facility] CONSULT NOTE  Patient Care Team: Valerio Melanie DASEN, NP as PCP - General (Nurse Practitioner) Perla Evalene PARAS, MD as Consulting Physician (Cardiology) Dasher, Alm LABOR, MD as Consulting Physician (Dermatology) Elma Zachary RAMAN [facility]) Rennie Cindy SAUNDERS, MD as Consulting Physician (Oncology) Cindie Ole DASEN, MD as Consulting Physician (Cardiology) Francisca Redell BROCKS, MD as Consulting Physician (Urology)  CHIEF COMPLAINTS/PURPOSE OF CONSULTATION: Pancytopenia  Oncology History Overview Note  # PANCYTOPENIA-[2019- thrombocytopebia hemoglobin- 10; leukopenia- 2.5; no diff. platelets 100s. DEC 2024-CT- [renal]- NO cirrhosis; spleen.   APRIL 2025- bone marrow-  # BONE MARROW, ASPIRATE, CLOT, CORE: - Hypercellular bone marrow (average 45%) with myeloid hyperplasia, erythroid hypoplasia and megakaryocytic dyspoiesis -  Plasmacytosis (17% aspirate; greater than 30% biopsy) with aberrant  CD56/cyclin D1 positivity consistent with a plasma cell neoplasm.  The differential diagnosis for hypercellularity/dyspoiesis-concurrent MDS with plasmacytosis/multiple myeloma.  Other possibility likely reactive secondary plasmacytosis. plasmacytosis ranging from 17% (aspirate to focally 70% by CD138 IHC on the biopsy/clot section with an overall average interpreted as 30%.  In addition there is aberrant cyclin D1 expression as well as apparent CD117 expression; however, definitive clonality could not be established given poor kappa/lambda ISH staining; however, this is interpreted to most likely represent a plasma cell neoplasm . Addendum: I reviewed with the pathologist-although unable to assess for clonality  of the bone marrow plasma cells.  However given-11:14 translocation-suggestive of clonality positive.  Plasma cells in bone marrow on average about 30% [patchy].  Given the quite not impressive serum electrophoresis/kappa lambda light chain ratio-suggestive of nonsecretory  multiple myeloma Vs reactive process.  FISH panel for plasma cell neoplasm: 11:14 translocation. 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  Alone.     [name] [name] 78 y.o.  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  Discussed the use of AI scribe software for clinical note transcription with the patient, who gave verbal consent to proceed.  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  He is managed with Aranesp  injections, initiated in September, for MDS-related anemia. The dosing interval was recently increased to every two weeks due to prior declining hemoglobin levels. Hemoglobin has gradually improved from 8.1 g/dL to 8.8 g/dL, with a most recent value of 9.4 g/dL. He also takes 35 mg oral iron supplementation. He expresses a goal of achieving a hemoglobin level of 10 g/dL.  He describes intermittent hematuria, characterized by passage of small red flakes and occasional clots, which he attributes to prior prostate interventions including seed implant, biopsy, and partial arterial embolization. He previously required catheterization for urinary retention, but symptoms have improved since these procedures. He prefers to avoid further urologic intervention due to concerns about incontinence and is satisfied with his current urinary status.  He continues to follow with another  provider for additional monitoring and testing, with recent hemoglobin values consistent with those obtained in this clinic.       Review of Systems  Constitutional:  Positive for malaise/fatigue. Negative for chills, diaphoresis, fever and weight loss.  HENT:  Negative for nosebleeds and sore throat.   Eyes:  Negative for double vision.  Respiratory:  Negative for cough, hemoptysis, sputum production, shortness of breath and wheezing.   Cardiovascular:  Negative for chest pain, palpitations, orthopnea and leg swelling.  Gastrointestinal:  Negative for abdominal pain, blood in stool, constipation, diarrhea, heartburn, melena, nausea and vomiting.  Genitourinary:  Negative for dysuria, frequency and urgency.  Musculoskeletal:  Negative for back pain and joint pain.  Skin: Negative.  Negative for itching and rash.  Neurological:  Negative for dizziness, tingling, focal weakness, weakness and headaches.  Endo/Heme/Allergies:  Does not bruise/bleed easily.  Psychiatric/Behavioral:  Negative for depression. The patient is not nervous/anxious and does not have insomnia.      MEDICAL HISTORY:  Past Medical History:  Diagnosis Date   A-fib [facility])    Atrial fibrillation with RVR (HCC)    Dupuytren's contracture    Essential hypertension 09/05/2014   Hypertension    Hypokalemia    Hypothyroidism    Malignant neoplasm of prostate (HCC) 09/05/2014   prostate,skin of the scalp   Mitral regurgitation    a. echo 03/2015: echo 03/2015: EF 50-55%, no RWMA, no sufficienct to allow for LV diastolic fxn, mild MR, LA mildly dilated at 46 mm, RV systolic fxn nl, PASP nl   Nephrolithiasis    New onset atrial fibrillation (HCC) 04/18/2015   OSA (obstructive sleep apnea) 05/11/2015   Persistent atrial fibrillation (HCC)    Skin cancer    Sleep apnea     SURGICAL HISTORY: Past Surgical History:  Procedure Laterality Date   COLONOSCOPY WITH PROPOFOL  N/A 04/18/2015   Procedure: COLONOSCOPY WITH  PROPOFOL ;  Surgeon: Rogelia Copping, MD;  Location: [facility] ENDOSCOPY;  Service: Endoscopy;  Laterality: N/A;   ELECTROPHYSIOLOGIC STUDY N/A 05/05/2015   Procedure: CARDIOVERSION;  Surgeon: Deatrice DELENA Cage, MD;  Location: [facility] ORS;  Service: Cardiovascular;  Laterality: N/A;   IR BONE MARROW BIOPSY & ASPIRATION  07/08/2023   IR EMBO TUMOR ORGAN ISCHEMIA INFARCT INC GUIDE ROADMAPPING  05/06/2023   IR RADIOLOGIST EVAL & MGMT  02/18/2023   IR RADIOLOGIST EVAL & MGMT  04/17/2023   IR RADIOLOGIST EVAL & MGMT  05/27/2023   RADIOACTIVE SEED IMPLANT N/A     SOCIAL HISTORY: Social History   Socioeconomic History   Marital status: Married    Spouse name: [name]   Number of children: 2   Years of education: Not on file   Highest education level: Bachelor's degree (e.g., BA, AB, BS)  Occupational History   Occupation: retired     Comment: acupuncturist  Tobacco Use   Smoking status: Former    Current packs/day: 0.00    Average packs/day: 1 pack/day for 30.5 years (30.5 ttl pk-yrs)    Types: Cigarettes    Start date: 1966    Quit date: 09/05/1994    Years since quitting: 29.5   Smokeless tobacco: Former    Types: Chew    Quit date: 09/04/2004  Vaping Use   Vaping status: Never Used  Substance and Sexual Activity   Alcohol use: No   Drug use: No   Sexual activity: Not Currently  Other Topics Concern   Not on file  Social History Narrative   Plays racket ball twice a week, works outside. Indoor pets, dog and cat.    Social Drivers of Health   Tobacco Use: Medium Risk (03/05/2024)  Patient History    Smoking Tobacco Use: Former    Smokeless Tobacco Use: Former    Passive Exposure: Not on Actuary Strain: Low Risk (11/25/2023)   Overall Financial Resource Strain (CARDIA)    Difficulty of Paying Living Expenses: Not hard at all  Food Insecurity: No Food Insecurity (11/25/2023)   Epic    Worried About Programme Researcher, Broadcasting/film/video in the Last Year: Never true    Ran Out of Food  in the Last Year: Never true  Transportation Needs: No Transportation Needs (11/25/2023)   Epic    Lack of Transportation (Medical): No    Lack of Transportation (Non-Medical): No  Physical Activity: Sufficiently Active (11/25/2023)   Exercise Vital Sign    Days of Exercise per Week: 2 days    Minutes of Exercise per Session: 90 min  Stress: No Stress Concern Present (11/25/2023)   [employer] of Occupational Health - Occupational Stress Questionnaire    Feeling of Stress: Not at all  Social Connections: Socially Integrated (11/25/2023)   Social Connection and Isolation Panel    Frequency of Communication with Friends and Family: Twice a week    Frequency of Social Gatherings with Friends and Family: More than three times a week    Attends Religious Services: More than 4 times per year    Active Member of [employer] or Organizations: Yes    Attends Banker Meetings: More than 4 times per year    Marital Status: Married  Catering Manager Violence: Not At Risk (11/25/2023)   Epic    Fear of Current or Ex-Partner: No    Emotionally Abused: No    Physically Abused: No    Sexually Abused: No  Depression (PHQ2-9): Low Risk (03/05/2024)   Depression (PHQ2-9)    PHQ-2 Score: 1  Alcohol Screen: Low Risk (11/25/2023)   Alcohol Screen    Last Alcohol Screening Score (AUDIT): 0  Housing: Low Risk (11/25/2023)   Epic    Unable to Pay for Housing in the Last Year: No    Number of Times Moved in the Last Year: 0    Homeless in the Last Year: No  Utilities: Not At Risk (11/25/2023)   Epic    Threatened with loss of utilities: No  Health Literacy: Adequate Health Literacy (11/25/2023)   B1300 Health Literacy    Frequency of need for help with medical instructions: Never    FAMILY HISTORY: Family History  Problem Relation Age of Onset   Heart attack Mother    Heart disease Father    Colon cancer Maternal Aunt    Arthritis Maternal Grandmother     ALLERGIES:  has no known  allergies.  MEDICATIONS:  Current Outpatient Medications  Medication Sig Dispense Refill   amiodarone  (PACERONE ) 200 MG tablet Take 1 tablet by mouth once daily 90 tablet 0   carvedilol  (COREG ) 25 MG tablet Take 1 tablet (25 mg total) by mouth 2 (two) times daily with a meal. 180 tablet 3   Cholecalciferol (VITAMIN D-3) 125 MCG (5000 UT) TABS Take 5,000 Units by mouth daily.     ferrous sulfate 325 (65 FE) MG tablet Take 325 mg by mouth daily with breakfast.     finasteride  (PROSCAR ) 5 MG tablet Take 1 tablet (5 mg total) by mouth daily. 90 tablet 4   folic acid  (FOLVITE ) 400 MCG tablet Take 400 mcg by mouth daily.     levothyroxine  (SYNTHROID ) 100 MCG tablet Take 1 tablet (100 mcg  total) by mouth daily. 60 tablet 3   Multiple Vitamin (MULTIVITAMIN) tablet Take 1 tablet by mouth daily.     olmesartan  (BENICAR ) 40 MG tablet Take 1 tablet by mouth once daily 90 tablet 0   Zinc 50 MG TABS Take 50 mg by mouth daily.     No current facility-administered medications for this visit.   Facility-Administered Medications Ordered in Other Visits  Medication Dose Route Frequency Provider Last Rate Last Admin   Darbepoetin Alfa  (ARANESP ) injection 500 mcg  500 mcg Subcutaneous Once Navy Rothschild R, MD         PHYSICAL EXAMINATION:   Vitals:   03/05/24 0858  BP: 119/74  Pulse: 64  Temp: (!) 96 F (35.6 C)  SpO2: 100%    Filed Weights   03/05/24 0858  Weight: 223 lb (101.2 kg)     Physical Exam Vitals and nursing note reviewed.  HENT:     Head: Normocephalic and atraumatic.     Mouth/Throat:     Pharynx: Oropharynx is clear.  Eyes:     Extraocular Movements: Extraocular movements intact.     Pupils: Pupils are equal, round, and reactive to light.  Cardiovascular:     Rate and Rhythm: Normal rate and regular rhythm.  Pulmonary:     Comments: Decreased breath sounds bilaterally.  Abdominal:     Palpations: Abdomen is soft.  Musculoskeletal:        General: Normal range  of motion.     Cervical back: Normal range of motion.  Skin:    General: Skin is warm.  Neurological:     General: No focal deficit present.     Mental Status: He is alert and oriented to person, place, and time.  Psychiatric:        Behavior: Behavior normal.        Judgment: Judgment normal.      LABORATORY DATA:  I have reviewed the data as listed Lab Results  Component Value Date   WBC 3.5 (L) 03/05/2024   HGB 8.8 (L) 03/05/2024   HCT 31.2 (L) 03/05/2024   MCV 95.4 03/05/2024   PLT 194 03/05/2024   Recent Labs    07/02/23 1215 07/02/23 1215 08/19/23 1500 11/11/23 0944 12/10/23 0904 01/22/24 0952  NA 139  --  140 141 139 140  K 4.0  --  4.1 4.1 3.5 3.6  CL 107  --  110 110 109 107  CO2 25  --  23 25 24 24   GLUCOSE 123*  --  103* 115* 105* 112*  BUN 20  --  27* 26* 26* 22  CREATININE 1.24   < > 1.34* 1.21 1.25* 1.16  CALCIUM 8.9  --  8.4* 8.9 8.5* 8.6*  GFRNONAA 60*   < > 54* >60 59* >60  PROT 7.0  --  6.0* 6.1*  --   --   ALBUMIN 4.2  --  4.0 3.8  --   --   AST 25  --  30 27  --   --   ALT 23  --  27 20  --   --   ALKPHOS 86  --  77 68  --   --   BILITOT 1.8*  --  1.6* 1.6*  --   --    < > = values in this interval not displayed.     No results found.   ASSESSMENT & PLAN:   MDS (myelodysplastic syndrome), low grade (HCC) # CONCURRENT MDS- MULTIPLE MYELOMA:  #  MULTIPLE MYELOMA:  [s/p [facility] re-view: non-secretory- Plasma cells are increased and account for ~40% of overall marrow cellularity; Dr. Homer. ] -Currently on surveillance.  # APRIL 2025- # BM Bx revealed LOW GRADE MDS [IPS-R-2] with <5% blasts, normal karyotype and mutations in ASXL1, SRSF2, STAG2, TET2 and NF1-  [Dr. Carolan at [facility].]  -Currently on Aranesp  every 2 weeks.  # Today hemoglobin is 8.1 white count 2.6/platelets 160-slightly lower hemoglobin than previous.  Proceed with Aranesp  again every 3 weeks.  Iron levels adequate.  If hemoglobin not improving or getting worse on  Aranesp -Will consider repeating a bone marrow biopsy for further evaluation.  # Hypothyroidsm-on Synthroid  June 2025 PET scan incidental -there is asymmetric uptake along the right thyroid  nodule. A neoplastic lesion is possible- recommend thyroid  ultrasound for further evaluation. US - Thyroid - AUG Positive for a 2.5 cm nodule within the right mid thyroid  gland orresponding with the focus of hypermetabolic activity on prior PET-CT.  Recommend biopsy at next visit.   # A.fib [id number] Eliquis ; Dr.Gollan- sec to hematuria- OCT 2024]- on amiodarone -Ok from hematology stand point to proceed with watchman device. [Dr.Lambert]  # Hx of hematuria-history of prostate cancer [2011; Dr.Snininski]]-status post brachytherapy/hematuria-status post IR ablation improved. No concerns of recurrence.  Continue oral iron.  # DISPOSITION: # Aranesp  today; NO venofer # in 2 weeks- H&H- Aranesp  x4  # follow up in 8  weeks- MD; labs- cbc/bmp; iron studies; ferritin LDH- MM panel; K/l light chains--- possible arapesp/possible Venofer-  Dr.B  Cc; Dr.Gollan, Dr.Lambert, Ms.Cannady      All questions were answered. The patient knows to call the clinic with any problems, questions or concerns.   Cindy JONELLE Joe, MD 03/05/2024 9:46 AM

## 2024-03-10 ENCOUNTER — Telehealth: Payer: Self-pay

## 2024-03-10 NOTE — Progress Notes (Signed)
" ° °  03/10/2024  Patient ID: [name] [name], male   DOB: 04-20-1945, 78 y.o.   MRN: [id number]  This patient is appearing on a report for being at risk of failing the adherence measure for hypertension (ACEi/ARB) medications this calendar year.   Medication: olmesartan  40mg   Last fill date: 01/23/24 for 90 day supply  Insurance report was not up to date. No action needed at this time.   [name], PharmD, DPLA   "

## 2024-03-15 NOTE — Progress Notes (Unsigned)
 "       Date:  03/15/2024   ID:  [name] [name], DOB 10/07/1945, MRN [id number]  Patient Location:  [address] [place] [address]   Provider location:   [facility], [place] office  PCP:  Valerio Melanie DASEN, NP  Cardiologist:  Logyn Kendrick, [facility]  No chief complaint on file.   History of Present Illness:    [name] [name] is a 78 y.o. male  past medical history of Afib with RVR with heart rates in the 180's in the setting of hypokalemia and colonoscopy prep,  previous cardioversion  started on Eliquis  given his CHADS2VASc of 2 (HTN and age x 1).  Prior hematuria 2021 and in 2024 HTN,  remote smoking history when he was in his 79s and 30s, hypothyroidism,  prostate cancer, CT scan ABD showing no CAD, no aorta disease or iliac vessel  Sleep apnea, wears his CPAP, avg about 3 hours then gets nasal congestion He presents for routine follow-up of his paroxysmal atrial fibrillation  Last seen in clinic 12/24 Seen by EP May 2025 s/p prostate artery embolization 04/2023     Hx of hematuria Having significant trouble passing clots, inability to void for up to 12 hours He has been evaluated by urology Prior history of prostate cancer, status post brachytherapy, hypervascular prostate Completed cystoscopy  Has been on and off the Eliquis  with recurrent hematuria   prostate cancer seed treatment, off eliquis  Was bleeding sept 24 to November (closer to 2 months) Did bladder cath Bleeding stopped, hematuria restarted back on eliquis  Currently not on Eliquis  Most recently tried December 7 but had hematuria  Considering procedure to restrict blood flow to prostate  Has appointment with EP for consideration of Watchman device in January 2025  Otherwise reports he feels well denies significant shortness of breath, no chest pain on exertion Denies tachycardia or palpitations concerning for recurrent arrhythmia Remains active  Still goes hunting,   Plays racquetball 2x a week  Labs reviewed Total chol 138, LDL 80  CT scan pulled up , mild aortic athero in branches Previously in [place] that he preferred not to be on a statin  EKG personally reviewed by myself on todays visit      Lab Results  Component Value Date   CHOL 115 10/21/2023   HDL 37 (L) 10/21/2023   LDLCALC 66 10/21/2023   TRIG 55 10/21/2023   Other past medical hx review  Previous echocardiogram ECHO EF 50 to 55%, 04/18/2015   clinic visit 05/30/16, atrial fibrillation for several days  does not feel as well when playing racquetball     previously on benazepril , stopped for low blood pressure   He did have episode of hematuria May 2017 Had a Cystoscopy. No significant pathology per the patient No more hematuria since then     bowel prep for a routine screening colonoscopy which he was at [facility] as an [facility] for on 04/18/15.  he was noted to be in new onset Afib with RVR with HR in the 180's  He was completely asymptomatic.     Past Medical History:  Diagnosis Date   A-fib [facility])    Atrial fibrillation with RVR (HCC)    Dupuytren's contracture    Essential hypertension 09/05/2014   Hypertension    Hypokalemia    Hypothyroidism    Malignant neoplasm of prostate (HCC) 09/05/2014   prostate,skin of the scalp   Mitral regurgitation    a. echo 03/2015: echo 03/2015: EF 50-55%,  no RWMA, no sufficienct to allow for LV diastolic fxn, mild MR, LA mildly dilated at 46 mm, RV systolic fxn nl, PASP nl   Nephrolithiasis    New onset atrial fibrillation (HCC) 04/18/2015   OSA (obstructive sleep apnea) 05/11/2015   Persistent atrial fibrillation (HCC)    Skin cancer    Sleep apnea    Past Surgical History:  Procedure Laterality Date   COLONOSCOPY WITH PROPOFOL  N/A 04/18/2015   Procedure: COLONOSCOPY WITH PROPOFOL ;  Surgeon: Rogelia Copping, MD;  Location: [facility] ENDOSCOPY;  Service: Endoscopy;  Laterality: N/A;   ELECTROPHYSIOLOGIC STUDY N/A 05/05/2015    Procedure: CARDIOVERSION;  Surgeon: Deatrice DELENA Cage, MD;  Location: [facility] ORS;  Service: Cardiovascular;  Laterality: N/A;   IR BONE MARROW BIOPSY & ASPIRATION  07/08/2023   IR EMBO TUMOR ORGAN ISCHEMIA INFARCT INC GUIDE ROADMAPPING  05/06/2023   IR RADIOLOGIST EVAL & MGMT  02/18/2023   IR RADIOLOGIST EVAL & MGMT  04/17/2023   IR RADIOLOGIST EVAL & MGMT  05/27/2023   RADIOACTIVE SEED IMPLANT N/A      No outpatient medications have been marked as taking for the 03/19/24 encounter (Appointment) with Antonae Zbikowski J, MD.     Allergies:   Patient has no known allergies.   Social History   Tobacco Use   Smoking status: Former    Current packs/day: 0.00    Average packs/day: 1 pack/day for 30.5 years (30.5 ttl pk-yrs)    Types: Cigarettes    Start date: 4    Quit date: 09/05/1994    Years since quitting: 29.5   Smokeless tobacco: Former    Types: Chew    Quit date: 09/04/2004  Vaping Use   Vaping status: Never Used  Substance Use Topics   Alcohol use: No   Drug use: No     Family Hx: The patient's family history includes Arthritis in his maternal grandmother; Colon cancer in his maternal aunt; Heart attack in his mother; Heart disease in his father.  ROS:   Please see the history of present illness.    Review of Systems  Constitutional: Negative.   HENT: Negative.    Respiratory: Negative.    Cardiovascular: Negative.   Gastrointestinal: Negative.   Musculoskeletal: Negative.   Neurological: Negative.   Psychiatric/Behavioral: Negative.    All other systems reviewed and are negative.    Labs/Other Tests and Data Reviewed:    Recent Labs: 10/21/2023: TSH 4.180 11/11/2023: ALT 20 03/05/2024: BUN 28; Creatinine, Ser 1.61; Hemoglobin 8.8; Platelet Count 194; Potassium 3.9; Sodium 141   Recent Lipid Panel Lab Results  Component Value Date/Time   CHOL 115 10/21/2023 11:32 AM   TRIG 55 10/21/2023 11:32 AM   HDL 37 (L) 10/21/2023 11:32 AM   CHOLHDL 2.7 09/24/2017 10:24  AM   LDLCALC 66 10/21/2023 11:32 AM    Wt Readings from Last 3 Encounters:  03/05/24 223 lb (101.2 kg)  01/22/24 231 lb 12.8 oz (105.1 kg)  12/24/23 230 lb (104.3 kg)     Exam:    There were no vitals taken for this visit. Constitutional:  oriented to person, place, and time. No distress.  HENT:  Head: Grossly normal Eyes:  no discharge. No scleral icterus.  Neck: No JVD, no carotid bruits  Cardiovascular: Regular rate and rhythm, no murmurs appreciated Pulmonary/Chest: Clear to auscultation bilaterally, no wheezes or rails Abdominal: Soft.  no distension.  no tenderness.  Musculoskeletal: Normal range of motion Neurological:  normal muscle tone. Coordination normal. No atrophy  Skin: Skin warm and dry Psychiatric: normal affect, pleasant   ASSESSMENT & PLAN:    Persistent atrial fibrillation Maintaining normal sinus rhythm continue amiodarone , metoprolol ,  Eliquis  currently on hold in the setting of recurrent hematuria following prostate cancer, brachytherapy, hypervascular prostate Scheduled to see Dr. Cindie in 1 month for consideration of Watchman device He may retrial Eliquis  over the next month recommend he hold Eliquis  as needed for recurrent hematuria -He will need TSH in January  Essential hypertension Blood pressure elevated, recommend he hold metoprolol  tartrate, we will change to Coreg  12.5 twice daily Suggest close monitoring of blood pressure at home, if it runs high will increase dose of carvedilol   OSA (obstructive sleep apnea) Wears CPAP on a regular basis  Encounter for anticoagulation discussion and monitoring Long discussion concerning Eliquis  and recent hematuria, followed by urology  Signed, Evalene Lunger, MD  03/15/2024 7:59 AM    [facility] [address], [place], [place] [address]  "

## 2024-03-19 ENCOUNTER — Ambulatory Visit: Attending: Cardiovascular Disease | Admitting: Cardiovascular Disease

## 2024-03-19 ENCOUNTER — Inpatient Hospital Stay: Attending: Internal Medicine

## 2024-03-19 ENCOUNTER — Encounter: Payer: Self-pay | Admitting: Cardiovascular Disease

## 2024-03-19 ENCOUNTER — Inpatient Hospital Stay

## 2024-03-19 VITALS — BP 191/70

## 2024-03-19 VITALS — BP 126/60 | HR 77 | Ht 73.0 in | Wt 233.1 lb

## 2024-03-19 DIAGNOSIS — D46Z Other myelodysplastic syndromes: Secondary | ICD-10-CM

## 2024-03-19 DIAGNOSIS — I10 Essential (primary) hypertension: Secondary | ICD-10-CM | POA: Diagnosis not present

## 2024-03-19 DIAGNOSIS — N1831 Chronic kidney disease, stage 3a: Secondary | ICD-10-CM

## 2024-03-19 DIAGNOSIS — D4620 Refractory anemia with excess of blasts, unspecified: Secondary | ICD-10-CM | POA: Diagnosis present

## 2024-03-19 DIAGNOSIS — E785 Hyperlipidemia, unspecified: Secondary | ICD-10-CM | POA: Diagnosis not present

## 2024-03-19 DIAGNOSIS — I700 Atherosclerosis of aorta: Secondary | ICD-10-CM | POA: Diagnosis not present

## 2024-03-19 DIAGNOSIS — I480 Paroxysmal atrial fibrillation: Secondary | ICD-10-CM | POA: Diagnosis not present

## 2024-03-19 LAB — HEMOGLOBIN AND HEMATOCRIT (CANCER CENTER ONLY)
HCT: 29.5 % — ABNORMAL LOW (ref 39.0–52.0)
Hemoglobin: 8.6 g/dL — ABNORMAL LOW (ref 13.0–17.0)

## 2024-03-19 MED ORDER — DARBEPOETIN ALFA 500 MCG/ML IJ SOSY
500.0000 ug | PREFILLED_SYRINGE | Freq: Once | INTRAMUSCULAR | Status: AC
  Administered 2024-03-19: 500 ug via SUBCUTANEOUS
  Filled 2024-03-19: qty 1

## 2024-03-19 MED ORDER — CARVEDILOL 25 MG PO TABS: 25.0000 mg | ORAL_TABLET | Freq: Two times a day (BID) | ORAL | 3 refills | Status: AC

## 2024-03-19 NOTE — Patient Instructions (Addendum)
 Medication Instructions:  No changes  If you need a refill on your cardiac medications before your next appointment, please call your pharmacy.   Lab work: No new labs needed  Testing/Procedures: No new testing needed  Follow-Up: At [facility], you and your health needs are our priority.  As part of our continuing mission to provide you with exceptional heart care, we have created designated Provider Care Teams.  These Care Teams include your primary Cardiologist (physician) and Advanced Practice Providers (APPs -  Physician Assistants and Nurse Practitioners) who all work together to provide you with the care you need, when you need it.  You will need a follow up appointment in 12 months  Providers on your designated Care Team:   Lonni Meager, NP Bernardino Bring, PA-C Cadence Franchester, PA-C  Follow up with Dr. Kennyth for the [facility] discussion  COVID-19 Vaccine Information can be found at: [email or web address] For questions related to vaccine distribution or appointments, please email [email or web address] or call 7132913640.

## 2024-03-26 ENCOUNTER — Other Ambulatory Visit: Payer: Self-pay | Admitting: *Deleted

## 2024-03-26 DIAGNOSIS — D46Z Other myelodysplastic syndromes: Secondary | ICD-10-CM

## 2024-04-02 ENCOUNTER — Inpatient Hospital Stay

## 2024-04-02 VITALS — BP 119/64

## 2024-04-02 DIAGNOSIS — D4620 Refractory anemia with excess of blasts, unspecified: Secondary | ICD-10-CM | POA: Diagnosis not present

## 2024-04-02 DIAGNOSIS — D46Z Other myelodysplastic syndromes: Secondary | ICD-10-CM

## 2024-04-02 LAB — CBC WITH DIFFERENTIAL (CANCER CENTER ONLY)
Abs Immature Granulocytes: 0.17 K/uL — ABNORMAL HIGH (ref 0.00–0.07)
Basophils Absolute: 0 K/uL (ref 0.0–0.1)
Basophils Relative: 1 %
Eosinophils Absolute: 0 K/uL (ref 0.0–0.5)
Eosinophils Relative: 0 %
HCT: 29.4 % — ABNORMAL LOW (ref 39.0–52.0)
Hemoglobin: 8.6 g/dL — ABNORMAL LOW (ref 13.0–17.0)
Immature Granulocytes: 4 %
Lymphocytes Relative: 43 %
Lymphs Abs: 1.8 K/uL (ref 0.7–4.0)
MCH: 28.3 pg (ref 26.0–34.0)
MCHC: 29.3 g/dL — ABNORMAL LOW (ref 30.0–36.0)
MCV: 96.7 fL (ref 80.0–100.0)
Monocytes Absolute: 0.6 K/uL (ref 0.1–1.0)
Monocytes Relative: 15 %
Neutro Abs: 1.5 K/uL — ABNORMAL LOW (ref 1.7–7.7)
Neutrophils Relative %: 37 %
Platelet Count: 160 K/uL (ref 150–400)
RBC: 3.04 MIL/uL — ABNORMAL LOW (ref 4.22–5.81)
RDW: 21.5 % — ABNORMAL HIGH (ref 11.5–15.5)
Smear Review: NORMAL
WBC Count: 4.1 K/uL (ref 4.0–10.5)
nRBC: 0.7 % — ABNORMAL HIGH (ref 0.0–0.2)

## 2024-04-02 MED ORDER — DARBEPOETIN ALFA 500 MCG/ML IJ SOSY
500.0000 ug | PREFILLED_SYRINGE | Freq: Once | INTRAMUSCULAR | Status: AC
  Administered 2024-04-02: 500 ug via SUBCUTANEOUS
  Filled 2024-04-02: qty 1

## 2024-04-16 ENCOUNTER — Inpatient Hospital Stay

## 2024-04-16 ENCOUNTER — Other Ambulatory Visit (HOSPITAL_COMMUNITY): Payer: Self-pay

## 2024-04-16 ENCOUNTER — Other Ambulatory Visit: Payer: Self-pay | Admitting: Cardiovascular Disease

## 2024-04-16 ENCOUNTER — Encounter: Payer: Self-pay | Admitting: Internal Medicine

## 2024-04-16 ENCOUNTER — Other Ambulatory Visit: Payer: Self-pay | Admitting: Internal Medicine

## 2024-04-16 ENCOUNTER — Telehealth: Payer: Self-pay | Admitting: *Deleted

## 2024-04-16 VITALS — BP 133/77

## 2024-04-16 DIAGNOSIS — D4620 Refractory anemia with excess of blasts, unspecified: Secondary | ICD-10-CM | POA: Diagnosis not present

## 2024-04-16 DIAGNOSIS — D46Z Other myelodysplastic syndromes: Secondary | ICD-10-CM

## 2024-04-16 DIAGNOSIS — I480 Paroxysmal atrial fibrillation: Secondary | ICD-10-CM

## 2024-04-16 LAB — PATHOLOGIST SMEAR REVIEW

## 2024-04-16 LAB — CBC WITH DIFFERENTIAL (CANCER CENTER ONLY)
Abs Immature Granulocytes: 0.2 10*3/uL — ABNORMAL HIGH (ref 0.00–0.07)
Basophils Absolute: 0.2 10*3/uL — ABNORMAL HIGH (ref 0.0–0.1)
Basophils Relative: 4 %
Blasts: 1 %
Eosinophils Absolute: 0 10*3/uL (ref 0.0–0.5)
Eosinophils Relative: 1 %
HCT: 30.6 % — ABNORMAL LOW (ref 39.0–52.0)
Hemoglobin: 9 g/dL — ABNORMAL LOW (ref 13.0–17.0)
Lymphocytes Relative: 43 %
Lymphs Abs: 2 10*3/uL (ref 0.7–4.0)
MCH: 28.2 pg (ref 26.0–34.0)
MCHC: 29.4 g/dL — ABNORMAL LOW (ref 30.0–36.0)
MCV: 95.9 fL (ref 80.0–100.0)
Monocytes Absolute: 0.6 10*3/uL (ref 0.1–1.0)
Monocytes Relative: 12 %
Myelocytes: 4 %
Neutro Abs: 1.6 10*3/uL — ABNORMAL LOW (ref 1.7–7.7)
Neutrophils Relative %: 35 %
Platelet Count: 161 10*3/uL (ref 150–400)
RBC: 3.19 MIL/uL — ABNORMAL LOW (ref 4.22–5.81)
RDW: 21.2 % — ABNORMAL HIGH (ref 11.5–15.5)
WBC Count: 4.7 10*3/uL (ref 4.0–10.5)
nRBC: 0.6 % — ABNORMAL HIGH (ref 0.0–0.2)

## 2024-04-16 MED ORDER — DARBEPOETIN ALFA 500 MCG/ML IJ SOSY
500.0000 ug | PREFILLED_SYRINGE | Freq: Once | INTRAMUSCULAR | Status: AC
  Administered 2024-04-16: 500 ug via SUBCUTANEOUS
  Filled 2024-04-16: qty 1

## 2024-04-16 MED ORDER — AMIODARONE HCL 200 MG PO TABS
200.0000 mg | ORAL_TABLET | Freq: Every day | ORAL | 1 refills | Status: DC
  Filled 2024-04-16: qty 90, 90d supply, fill #0

## 2024-04-16 NOTE — Progress Notes (Signed)
 3% blasts noted on peripheral smear- 1/30.  Status post review with pathologist.   ordered peripheral blood flow cytometry with next visit/on February 13th.  GB

## 2024-04-16 NOTE — Telephone Encounter (Signed)
 Critical lab called by Cyndee in [facility]; 1 Blast was seen. He is sending for  path review. Read back . Dr B notified.

## 2024-04-19 ENCOUNTER — Other Ambulatory Visit (HOSPITAL_COMMUNITY): Payer: Self-pay

## 2024-04-19 ENCOUNTER — Telehealth: Payer: Self-pay | Admitting: Cardiovascular Disease

## 2024-04-19 DIAGNOSIS — I480 Paroxysmal atrial fibrillation: Secondary | ICD-10-CM

## 2024-04-19 MED ORDER — AMIODARONE HCL 200 MG PO TABS: 200.0000 mg | ORAL_TABLET | Freq: Every day | ORAL | 3 refills | Status: AC

## 2024-04-21 ENCOUNTER — Other Ambulatory Visit: Payer: Self-pay | Admitting: Nurse Practitioner

## 2024-04-21 ENCOUNTER — Other Ambulatory Visit (HOSPITAL_COMMUNITY): Payer: Self-pay

## 2024-04-23 NOTE — Telephone Encounter (Signed)
 Requested Prescriptions  Pending Prescriptions Disp Refills   olmesartan  (BENICAR ) 40 MG tablet [Pharmacy Med Name: Olmesartan  Medoxomil 40 MG Oral Tablet] 90 tablet 0    Sig: Take 1 tablet by mouth once daily     Cardiovascular:  Angiotensin Receptor Blockers Failed - 04/23/2024  8:33 AM      Failed - Cr in normal range and within 180 days    Creatinine  Date Value Ref Range Status  12/10/2023 1.25 (H) 0.61 - 1.24 mg/dL Final   Creatinine, Ser  Date Value Ref Range Status  03/05/2024 1.61 (H) 0.61 - 1.24 mg/dL Final         Passed - K in normal range and within 180 days    Potassium  Date Value Ref Range Status  03/05/2024 3.9 3.5 - 5.1 mmol/L Final         Passed - Patient is not pregnant      Passed - Last BP in normal range    BP Readings from Last 1 Encounters:  04/16/24 133/77         Passed - Valid encounter within last 6 months    Recent Outpatient Visits           6 months ago Atrial fibrillation with RVR (HCC)   [facility] [place], Melanie T, NP   1 year ago Stage 3a chronic kidney disease (HCC)   [facility] [place], Melanie DASEN, NP       Future Appointments             In 8 months [place], Redell BROCKS, MD [facility] [place]

## 2024-04-27 ENCOUNTER — Encounter: Admitting: Nurse Practitioner

## 2024-04-30 ENCOUNTER — Inpatient Hospital Stay

## 2024-04-30 ENCOUNTER — Inpatient Hospital Stay: Admitting: Internal Medicine

## 2024-11-30 ENCOUNTER — Ambulatory Visit

## 2024-12-29 ENCOUNTER — Ambulatory Visit: Admitting: Urology
